# Patient Record
Sex: Male | Born: 1950
Health system: Southern US, Community
[De-identification: ages and names within clinical notes are randomized; demographics above are authoritative.]

## PROBLEM LIST (undated history)

## (undated) DIAGNOSIS — E785 Hyperlipidemia, unspecified: Secondary | ICD-10-CM

## (undated) DIAGNOSIS — D751 Secondary polycythemia: Secondary | ICD-10-CM

## (undated) DIAGNOSIS — I714 Abdominal aortic aneurysm, without rupture, unspecified: Secondary | ICD-10-CM

## (undated) DIAGNOSIS — J449 Chronic obstructive pulmonary disease, unspecified: Secondary | ICD-10-CM

## (undated) DIAGNOSIS — I219 Acute myocardial infarction, unspecified: Secondary | ICD-10-CM

## (undated) DIAGNOSIS — I729 Aneurysm of unspecified site: Secondary | ICD-10-CM

## (undated) DIAGNOSIS — I10 Essential (primary) hypertension: Secondary | ICD-10-CM

## (undated) DIAGNOSIS — I2510 Atherosclerotic heart disease of native coronary artery without angina pectoris: Secondary | ICD-10-CM

## (undated) HISTORY — DX: Acute myocardial infarction, unspecified: I21.9

## (undated) HISTORY — DX: Aneurysm of unspecified site: I72.9

## (undated) HISTORY — PX: TONSILLECTOMY AND ADENOIDECTOMY: SUR1326

## (undated) HISTORY — DX: Essential (primary) hypertension: I10

## (undated) HISTORY — DX: Chronic obstructive pulmonary disease, unspecified: J44.9

## (undated) HISTORY — PX: APPENDECTOMY: SHX54

## (undated) HISTORY — DX: Hyperlipidemia, unspecified: E78.5

## (undated) HISTORY — DX: Abdominal aortic aneurysm, without rupture, unspecified: I71.40

---
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
## 2005-03-06 ENCOUNTER — Ambulatory Visit: Payer: Self-pay | Admitting: Internal Medicine

## 2014-11-05 ENCOUNTER — Encounter: Payer: Self-pay | Admitting: General Surgery

## 2014-11-13 ENCOUNTER — Encounter: Payer: Self-pay | Admitting: General Surgery

## 2014-11-13 ENCOUNTER — Ambulatory Visit (INDEPENDENT_AMBULATORY_CARE_PROVIDER_SITE_OTHER): Payer: PPO | Admitting: General Surgery

## 2014-11-13 VITALS — BP 124/78 | HR 80 | Resp 12 | Ht 75.0 in | Wt 232.0 lb

## 2014-11-13 DIAGNOSIS — Z1211 Encounter for screening for malignant neoplasm of colon: Secondary | ICD-10-CM

## 2014-11-13 MED ORDER — POLYETHYLENE GLYCOL 3350 17 GM/SCOOP PO POWD: 1.0000 | Freq: Once | ORAL | Status: DC

## 2014-11-13 NOTE — Patient Instructions (Addendum)
Colonoscopy A colonoscopy is an exam to look at the entire large intestine (colon). This exam can help find problems such as tumors, polyps, inflammation, and areas of bleeding. The exam takes about 1 hour.  LET [facility] CARE PROVIDER KNOW ABOUT:   Any allergies you have.  All medicines you are taking, including vitamins, herbs, eye drops, creams, and over-the-counter medicines.  Previous problems you or members of your family have had with the use of anesthetics.  Any blood disorders you have.  Previous surgeries you have had.  Medical conditions you have. RISKS AND COMPLICATIONS  Generally, this is a safe procedure. However, as with any procedure, complications can occur. Possible complications include:  Bleeding.  Tearing or rupture of the colon wall.  Reaction to medicines given during the exam.  Infection (rare). BEFORE THE PROCEDURE   Ask your health care provider about changing or stopping your regular medicines.  You may be prescribed an oral bowel prep. This involves drinking a large amount of medicated liquid, starting the day before your procedure. The liquid will cause you to have multiple loose stools until your stool is almost clear or light green. This cleans out your colon in preparation for the procedure.  Do not eat or drink anything else once you have started the bowel prep, unless your health care provider tells you it is safe to do so.  Arrange for someone to drive you home after the procedure. PROCEDURE   You will be given medicine to help you relax (sedative).  You will lie on your side with your knees bent.  A long, flexible tube with a light and camera on the end (colonoscope) will be inserted through the rectum and into the colon. The camera sends video back to a computer screen as it moves through the colon. The colonoscope also releases carbon dioxide gas to inflate the colon. This helps your health care provider see the area better.  During  the exam, your health care provider may take a small tissue sample (biopsy) to be examined under a microscope if any abnormalities are found.  The exam is finished when the entire colon has been viewed. AFTER THE PROCEDURE   Do not drive for 24 hours after the exam.  You may have a small amount of blood in your stool.  You may pass moderate amounts of gas and have mild abdominal cramping or bloating. This is caused by the gas used to inflate your colon during the exam.  Ask when your test results will be ready and how you will get your results. Make sure you get your test results. Document Released: 03/27/2000 Document Revised: 01/18/2013 Document Reviewed: 12/05/2012 [facility] Patient Information 2015 [place], [place]. This information is not intended to replace advice given to you by your health care provider. Make sure you discuss any questions you have with your health care provider.  Patient is scheduled for a colonoscopy at [facility] on 11/20/14. He is aware to call the day before for his arrival time. Miralax prescription has been sent into his pharmacy. Patient is aware of date and all instructions.

## 2014-11-13 NOTE — Progress Notes (Addendum)
Patient ID: Joshua Schmidt, male   DOB: 07-25-1950, 64 y.o.   MRN: [id number]  Chief Complaint  Patient presents with  . Colonoscopy    HPI Joshua Schmidt is a 64 y.o. male here today for a evaluation of a colonoscopy. Patient states no GI problems. HPI  History reviewed. No pertinent past medical history.  Past Surgical History  Procedure Laterality Date  . Appendectomy    . Tonsillectomy and adenoidectomy      History reviewed. No pertinent family history.  Social History History  Substance Use Topics  . Smoking status: Former Smoker -- 1.00 packs/day for 20 years    Types: Cigarettes  . Smokeless tobacco: Not on file  . Alcohol Use: No    No Known Allergies  Current Outpatient Prescriptions  Medication Sig Dispense Refill  . polyethylene glycol powder (GLYCOLAX/MIRALAX) powder Take 255 g by mouth once. 255 g 0   No current facility-administered medications for this visit.    Review of Systems Review of Systems  Constitutional: Negative.   Respiratory: Negative.   Cardiovascular: Negative.   Gastrointestinal: Negative.     Blood pressure 124/78, pulse 80, resp. rate 12, height 6\' 3"  (1.905 m), weight 232 lb (105.235 kg).  Physical Exam Physical Exam  Constitutional: He is oriented to person, place, and time. He appears well-developed and well-nourished.  Eyes: Conjunctivae are normal. No scleral icterus.  Neck: Neck supple.  Cardiovascular: Normal rate, regular rhythm and normal heart sounds.   Pulmonary/Chest: Effort normal and breath sounds normal.  Abdominal: Soft. Normal appearance and bowel sounds are normal. There is no tenderness. No hernia.    Lymphadenopathy:    He has no cervical adenopathy.  Neurological: He is alert and oriented to person, place, and time.  Skin: Skin is warm and dry.    Data Reviewed Notes reviewed  Assessment    Need for screening colonoscopy    Plan    Colonoscopy with possible biopsy/polypectomy prn:  Information regarding the procedure, including its potential risks and complications (including but not limited to perforation of the bowel, which may require emergency surgery to repair, and bleeding) was verbally given to the patient. Educational information regarding lower instestinal endoscopy was given to the patient. Written instructions for how to complete the bowel prep using Miralax were provided. The importance of drinking ample fluids to avoid dehydration as a result of the prep emphasized.     Patient is scheduled for a colonoscopy at [facility] on 11/20/14. He is aware to call the day before for his arrival time. Miralax prescription has been sent into his pharmacy. Patient is aware of date and all instructions.    Verlean Allport G 11/13/2014, 3:47 PM

## 2014-11-20 ENCOUNTER — Encounter: Payer: Self-pay | Admitting: Anesthesiology

## 2014-11-20 ENCOUNTER — Ambulatory Visit
Admission: RE | Admit: 2014-11-20 | Discharge: 2014-11-20 | Disposition: A | Discharge status: Home / Self Care | Payer: PPO | Source: Ambulatory Visit | Attending: General Surgery | Admitting: General Surgery

## 2014-11-20 ENCOUNTER — Encounter
Admission: RE | Disposition: A | Discharge status: Home / Self Care | Payer: Self-pay | Source: Ambulatory Visit | Attending: General Surgery

## 2014-11-20 ENCOUNTER — Ambulatory Visit: Payer: PPO | Admitting: Anesthesiology

## 2014-11-20 DIAGNOSIS — I10 Essential (primary) hypertension: Secondary | ICD-10-CM | POA: Insufficient documentation

## 2014-11-20 DIAGNOSIS — Z1211 Encounter for screening for malignant neoplasm of colon: Secondary | ICD-10-CM | POA: Diagnosis present

## 2014-11-20 DIAGNOSIS — Z87891 Personal history of nicotine dependence: Secondary | ICD-10-CM | POA: Diagnosis not present

## 2014-11-20 DIAGNOSIS — D122 Benign neoplasm of ascending colon: Secondary | ICD-10-CM | POA: Diagnosis not present

## 2014-11-20 DIAGNOSIS — K5730 Diverticulosis of large intestine without perforation or abscess without bleeding: Secondary | ICD-10-CM | POA: Diagnosis not present

## 2014-11-20 DIAGNOSIS — Z79899 Other long term (current) drug therapy: Secondary | ICD-10-CM | POA: Insufficient documentation

## 2014-11-20 HISTORY — PX: COLONOSCOPY WITH PROPOFOL: SHX5780

## 2014-11-20 SURGERY — COLONOSCOPY WITH PROPOFOL
Anesthesia: General

## 2014-11-20 MED ORDER — PROPOFOL INFUSION 10 MG/ML OPTIME
INTRAVENOUS | Status: DC | PRN
  Administered 2014-11-20 (×2): 120 ug/kg/min via INTRAVENOUS

## 2014-11-20 MED ORDER — SODIUM CHLORIDE 0.9 % IV SOLN
INTRAVENOUS | Status: DC
  Administered 2014-11-20: 1000 mL via INTRAVENOUS

## 2014-11-20 MED ORDER — LACTATED RINGERS IV SOLN
INTRAVENOUS | Status: DC | PRN
  Administered 2014-11-20: 13:00:00 via INTRAVENOUS

## 2014-11-20 MED ORDER — MIDAZOLAM HCL 2 MG/2ML IJ SOLN
INTRAMUSCULAR | Status: DC | PRN
  Administered 2014-11-20: 2 mg via INTRAVENOUS

## 2014-11-20 MED ORDER — FENTANYL CITRATE (PF) 100 MCG/2ML IJ SOLN
INTRAMUSCULAR | Status: DC | PRN
  Administered 2014-11-20: 25 ug via INTRAVENOUS
  Administered 2014-11-20: 50 ug via INTRAVENOUS
  Administered 2014-11-20: 25 ug via INTRAVENOUS

## 2014-11-20 NOTE — Anesthesia Postprocedure Evaluation (Signed)
  Anesthesia Post-op Note  Patient: Joshua Schmidt  Procedure(s) Performed: Procedure(s): COLONOSCOPY WITH PROPOFOL (N/A)  Anesthesia type:General  Patient location: PACU  Post pain: Pain level controlled  Post assessment: Post-op Vital signs reviewed, Patient's Cardiovascular Status Stable, Respiratory Function Stable, Patent Airway and No signs of Nausea or vomiting  Post vital signs: Reviewed and stable  Last Vitals:  Filed Vitals:   11/20/14 1515  BP: 156/103  Pulse: 65  Temp:   Resp: 18    Level of consciousness: awake, alert  and patient cooperative  Complications: No apparent anesthesia complications

## 2014-11-20 NOTE — Anesthesia Procedure Notes (Signed)
Performed by: COOK-MARTIN, Deward Sebek Pre-anesthesia Checklist: Patient identified, Emergency Drugs available, Suction available, Patient being monitored and Timeout performed Patient Re-evaluated:Patient Re-evaluated prior to inductionOxygen Delivery Method: Nasal cannula Preoxygenation: Pre-oxygenation with 100% oxygen Intubation Type: IV induction Placement Confirmation: positive ETCO2 and CO2 detector       

## 2014-11-20 NOTE — Interval H&P Note (Signed)
History and Physical Interval Note:  11/20/2014 1:48 PM  Joshua Schmidt  has presented today for surgery, with the diagnosis of SCREENING  The various methods of treatment have been discussed with the patient and family. After consideration of risks, benefits and other options for treatment, the patient has consented to  Procedure(s): COLONOSCOPY WITH PROPOFOL (N/A) as a surgical intervention .  The patient's history has been reviewed, patient examined, no change in status, stable for surgery.  I have reviewed the patient's chart and labs.  Questions were answered to the patient's satisfaction.     Coty Student G

## 2014-11-20 NOTE — Op Note (Signed)
[facility] Patient Name: Joshua Schmidt Procedure Date: 11/20/2014 1:15 PM MRN: [id number] Account #: [id number] Date of Birth: Sep 01, 1950 Admit Type: Outpatient Age: 64 Room: [facility] ENDO ROOM 1 Gender: Male Note Status: Finalized Procedure:         Colonoscopy Indications:       Screening for colorectal malignant neoplasm Providers:         Orlie Pollen, MD Referring MD:      [name], MD (Referring MD) Medicines:         General Anesthesia Complications:     No immediate complications. Procedure:         Pre-Anesthesia Assessment:                    - General anesthesia under the supervision of an                     anesthesiologist was determined to be medically necessary                     for this procedure based on review of the patient's                     medical history, medications, and prior anesthesia history.                    After obtaining informed consent, the colonoscope was                     passed under direct vision. Throughout the procedure, the                     patient's blood pressure, pulse, and oxygen saturations                     were monitored continuously. The Colonoscope was                     introduced through the anus and advanced to the the cecum,                     identified by the ileocecal valve. The colonoscopy was                     performed without difficulty. The patient tolerated the                     procedure well. The quality of the bowel preparation was                     good. Findings:      The perianal and digital rectal examinations were normal.      A few medium-mouthed diverticula were found in the sigmoid colon.      A 3 mm polyp was found in the proximal ascending colon. The polyp was       sessile. The polyp was removed with a hot biopsy forceps. Resection and       retrieval were complete.      The exam was otherwise without abnormality on direct and  retroflexion       views. Impression:        - Diverticulosis in the sigmoid colon.                    -  One 3 mm polyp in the proximal ascending colon. Resected                     and retrieved.                    - The examination was otherwise normal on direct and                     retroflexion views. Recommendation:    - Use fiber, for example Citrucel, Fibercon, Konsyl or                     Metamucil.                    - Await pathology results.                    - Repeat colonoscopy in 5 years for surveillance. Procedure Code(s): --- Professional ---                    [phone], Colonoscopy, flexible; with removal of tumor(s),                     polyp(s), or other lesion(s) by hot biopsy forceps Diagnosis Code(s): --- Professional ---                    Z12.11, Encounter for screening for malignant neoplasm of                     colon                    D12.2, Benign neoplasm of ascending colon                    K57.30, Diverticulosis of large intestine without                     perforation or abscess without bleeding CPT copyright 2014 American Medical Association. All rights reserved. The codes documented in this report are preliminary and upon coder review may  be revised to meet current compliance requirements. Orlie Pollen, MD 11/20/2014 2:43:29 PM This report has been signed electronically. Number of Addenda: 0 Note Initiated On: 11/20/2014 1:15 PM Scope Withdrawal Time: 0 hours 13 minutes 41 seconds  Total Procedure Duration: 0 hours 22 minutes 44 seconds       [facility]

## 2014-11-20 NOTE — Anesthesia Preprocedure Evaluation (Addendum)
Anesthesia Evaluation  Patient identified by MRN, date of birth, ID band Patient awake    Reviewed: Allergy & Precautions, H&P , NPO status , Patient's Chart, lab work & pertinent test results  Airway Mallampati: III  TM Distance: >3 FB Neck ROM: limited    Dental  (+) Poor Dentition, Chipped   Pulmonary former smoker,  breath sounds clear to auscultation  + decreased breath sounds      Cardiovascular hypertension, Normal cardiovascular examRhythm:regular Rate:Normal     Neuro/Psych negative neurological ROS  negative psych ROS   GI/Hepatic negative GI ROS, Neg liver ROS,   Endo/Other  negative endocrine ROS  Renal/GU negative Renal ROS  negative genitourinary   Musculoskeletal   Abdominal   Peds  Hematology negative hematology ROS (+)   Anesthesia Other Findings   Reproductive/Obstetrics negative OB ROS                           Anesthesia Physical Anesthesia Plan  ASA: II  Anesthesia Plan: General   Post-op Pain Management:    Induction:   Airway Management Planned:   Additional Equipment:   Intra-op Plan:   Post-operative Plan:   Informed Consent: I have reviewed the patients History and Physical, chart, labs and discussed the procedure including the risks, benefits and alternatives for the proposed anesthesia with the patient or authorized representative who has indicated his/her understanding and acceptance.   Dental Advisory Given  Plan Discussed with: Anesthesiologist, CRNA and Surgeon  Anesthesia Plan Comments:        Anesthesia Quick Evaluation

## 2014-11-20 NOTE — H&P (View-Only) (Signed)
Patient ID: Joshua Schmidt, male   DOB: Mar 23, 1951, 64 y.o.   MRN: [id number]  Chief Complaint  Patient presents with  . Colonoscopy    HPI Joshua Schmidt is a 64 y.o. male here today for a evaluation of a colonoscopy. Patient states no GI problems. HPI  History reviewed. No pertinent past medical history.  Past Surgical History  Procedure Laterality Date  . Appendectomy    . Tonsillectomy and adenoidectomy      History reviewed. No pertinent family history.  Social History History  Substance Use Topics  . Smoking status: Former Smoker -- 1.00 packs/day for 20 years    Types: Cigarettes  . Smokeless tobacco: Not on file  . Alcohol Use: No    No Known Allergies  Current Outpatient Prescriptions  Medication Sig Dispense Refill  . polyethylene glycol powder (GLYCOLAX/MIRALAX) powder Take 255 g by mouth once. 255 g 0   No current facility-administered medications for this visit.    Review of Systems Review of Systems  Constitutional: Negative.   Respiratory: Negative.   Cardiovascular: Negative.   Gastrointestinal: Negative.     Blood pressure 124/78, pulse 80, resp. rate 12, height 6\' 3"  (1.905 m), weight 232 lb (105.235 kg).  Physical Exam Physical Exam  Constitutional: He is oriented to person, place, and time. He appears well-developed and well-nourished.  Eyes: Conjunctivae are normal. No scleral icterus.  Neck: Neck supple.  Cardiovascular: Normal rate, regular rhythm and normal heart sounds.   Pulmonary/Chest: Effort normal and breath sounds normal.  Abdominal: Soft. Normal appearance and bowel sounds are normal. There is no tenderness. No hernia.    Lymphadenopathy:    He has no cervical adenopathy.  Neurological: He is alert and oriented to person, place, and time.  Skin: Skin is warm and dry.    Data Reviewed Notes reviewed  Assessment    Need for screening colonoscopy    Plan    Colonoscopy with possible biopsy/polypectomy prn:  Information regarding the procedure, including its potential risks and complications (including but not limited to perforation of the bowel, which may require emergency surgery to repair, and bleeding) was verbally given to the patient. Educational information regarding lower instestinal endoscopy was given to the patient. Written instructions for how to complete the bowel prep using Miralax were provided. The importance of drinking ample fluids to avoid dehydration as a result of the prep emphasized.     Patient is scheduled for a colonoscopy at [facility] on 11/20/14. He is aware to call the day before for his arrival time. Miralax prescription has been sent into his pharmacy. Patient is aware of date and all instructions.    SANKAR,SEEPLAPUTHUR G 11/13/2014, 3:47 PM

## 2014-11-20 NOTE — Transfer of Care (Signed)
Immediate Anesthesia Transfer of Care Note  Patient: Joshua Schmidt  Procedure(s) Performed: Procedure(s): COLONOSCOPY WITH PROPOFOL (N/A)  Patient Location: PACU  Anesthesia Type:General  Level of Consciousness: awake, alert  and sedated  Airway & Oxygen Therapy: Patient Spontanous Breathing and Patient connected to nasal cannula oxygen  Post-op Assessment: Report given to RN and Post -op Vital signs reviewed and stable  Post vital signs: Reviewed and stable  Last Vitals:  Filed Vitals:   11/20/14 1302  BP: 175/113  Pulse: 81  Temp: 37.6 C  Resp: 17    Complications: No apparent anesthesia complications

## 2014-11-22 LAB — SURGICAL PATHOLOGY

## 2014-11-23 ENCOUNTER — Encounter: Payer: Self-pay | Admitting: General Surgery

## 2014-11-27 ENCOUNTER — Telehealth: Payer: Self-pay | Admitting: *Deleted

## 2014-11-27 NOTE — Telephone Encounter (Signed)
-----   Message from Christene Lye, MD sent at 11/23/2014  7:11 AM EDT ----- Please let pt pt know the pathology was normal.He will need repeat colonoscopy in 5 yrs

## 2014-11-27 NOTE — Telephone Encounter (Signed)
Notified patient as instructed, patient pleased. Discussed follow-up appointments, patient agrees. Placed in recalls.  

## 2015-04-29 DIAGNOSIS — R1030 Lower abdominal pain, unspecified: Secondary | ICD-10-CM | POA: Diagnosis not present

## 2015-04-29 DIAGNOSIS — E7800 Pure hypercholesterolemia, unspecified: Secondary | ICD-10-CM | POA: Diagnosis not present

## 2015-04-29 DIAGNOSIS — E041 Nontoxic single thyroid nodule: Secondary | ICD-10-CM | POA: Diagnosis not present

## 2015-04-29 DIAGNOSIS — E559 Vitamin D deficiency, unspecified: Secondary | ICD-10-CM | POA: Diagnosis not present

## 2015-04-29 DIAGNOSIS — K5791 Diverticulosis of intestine, part unspecified, without perforation or abscess with bleeding: Secondary | ICD-10-CM | POA: Diagnosis not present

## 2015-04-29 DIAGNOSIS — R5383 Other fatigue: Secondary | ICD-10-CM | POA: Diagnosis not present

## 2015-04-29 DIAGNOSIS — J449 Chronic obstructive pulmonary disease, unspecified: Secondary | ICD-10-CM | POA: Diagnosis not present

## 2015-04-29 DIAGNOSIS — F4310 Post-traumatic stress disorder, unspecified: Secondary | ICD-10-CM | POA: Diagnosis not present

## 2015-10-31 DIAGNOSIS — R5383 Other fatigue: Secondary | ICD-10-CM | POA: Diagnosis not present

## 2015-10-31 DIAGNOSIS — Z716 Tobacco abuse counseling: Secondary | ICD-10-CM | POA: Diagnosis not present

## 2015-10-31 DIAGNOSIS — I10 Essential (primary) hypertension: Secondary | ICD-10-CM | POA: Diagnosis not present

## 2015-10-31 DIAGNOSIS — E7800 Pure hypercholesterolemia, unspecified: Secondary | ICD-10-CM | POA: Diagnosis not present

## 2015-10-31 DIAGNOSIS — J449 Chronic obstructive pulmonary disease, unspecified: Secondary | ICD-10-CM | POA: Diagnosis not present

## 2015-10-31 DIAGNOSIS — R7989 Other specified abnormal findings of blood chemistry: Secondary | ICD-10-CM | POA: Diagnosis not present

## 2015-10-31 DIAGNOSIS — E559 Vitamin D deficiency, unspecified: Secondary | ICD-10-CM | POA: Diagnosis not present

## 2015-10-31 DIAGNOSIS — E784 Other hyperlipidemia: Secondary | ICD-10-CM | POA: Diagnosis not present

## 2015-10-31 DIAGNOSIS — R609 Edema, unspecified: Secondary | ICD-10-CM | POA: Diagnosis not present

## 2015-11-04 DIAGNOSIS — Z87891 Personal history of nicotine dependence: Secondary | ICD-10-CM | POA: Diagnosis not present

## 2015-11-04 DIAGNOSIS — R0989 Other specified symptoms and signs involving the circulatory and respiratory systems: Secondary | ICD-10-CM | POA: Diagnosis not present

## 2015-11-04 DIAGNOSIS — M545 Low back pain: Secondary | ICD-10-CM | POA: Diagnosis not present

## 2015-11-05 ENCOUNTER — Encounter: Payer: Self-pay | Admitting: *Deleted

## 2015-11-06 ENCOUNTER — Ambulatory Visit (INDEPENDENT_AMBULATORY_CARE_PROVIDER_SITE_OTHER): Payer: PPO | Admitting: General Surgery

## 2015-11-06 ENCOUNTER — Encounter: Payer: Self-pay | Admitting: General Surgery

## 2015-11-06 VITALS — BP 130/74 | HR 76 | Resp 12 | Ht 75.0 in | Wt 209.0 lb

## 2015-11-06 DIAGNOSIS — L723 Sebaceous cyst: Secondary | ICD-10-CM | POA: Diagnosis not present

## 2015-11-06 NOTE — Patient Instructions (Signed)
The patient is aware to call back for any questions or concerns.  

## 2015-11-06 NOTE — Progress Notes (Signed)
Patient ID: Joshua Schmidt, male   DOB: 10/31/1950, 65 y.o.   MRN: [id number]  Chief Complaint  Patient presents with  . Other    cyst on back     HPI Joshua Schmidt is a 65 y.o. male here today for a evaluation of cysts on his back and forehead. Patient states he noticed the one on his back about 30 years and has gotten larger over time and is tender when pressure is placed on the area. The one on his forehead has been there for about 1 year and is not at all painful. He would like the forehead cyst evaluated for cosmetic purposes. He also states he has a small cyst on his abdomen. He states he had a recent cardiogram that showed a "silent" heart attack. I have reviewed the history of present illness with the patient.  HPI  Past Medical History:  Diagnosis Date  . Myocardial infarction [facility])     Past Surgical History:  Procedure Laterality Date  . APPENDECTOMY    . COLONOSCOPY WITH PROPOFOL N/A 11/20/2014   Procedure: COLONOSCOPY WITH PROPOFOL;  Surgeon: Christene Lye, MD;  Location: [facility] ENDOSCOPY;  Service: Endoscopy;  Laterality: N/A;  . TONSILLECTOMY AND ADENOIDECTOMY      No family history on file.  Social History Social History  Substance Use Topics  . Smoking status: Former Smoker    Packs/day: 1.00    Years: 20.00    Types: Cigarettes  . Smokeless tobacco: Never Used  . Alcohol use No    No Known Allergies  Current Outpatient Prescriptions  Medication Sig Dispense Refill  . hydrochlorothiazide (HYDRODIURIL) 25 MG tablet Take 25 mg by mouth daily.     No current facility-administered medications for this visit.     Review of Systems Review of Systems  Constitutional: Negative.   Respiratory: Negative.   Cardiovascular: Negative.     Blood pressure 130/74, pulse 76, resp. rate 12, height 6\' 3"  (1.905 m), weight 209 lb (94.8 kg).  Physical Exam Physical Exam  Constitutional: He is oriented to person, place, and time. He appears well-developed  and well-nourished.  HENT:  Head: Normocephalic and atraumatic.  Eyes: Conjunctivae are normal. No scleral icterus.  Neck: Neck supple.  Cardiovascular: Normal rate, regular rhythm and normal heart sounds.   Abdominal: Soft. Normal appearance. There is no tenderness.  Lymphadenopathy:    He has no cervical adenopathy.  Neurological: He is alert and oriented to person, place, and time.  Skin: Skin is warm and dry.     Psychiatric: His behavior is normal.    Data Reviewed None  Assessment    Sebaceous cysts, back and forehead    Plan    Removal of sebaceous cysts. The risks and benefits of in-office cyst removal were discussed with patient. Based on previous MI history, the risks associated with the procedure were discussed with his PCP. Patient was cleared for the procedure.     PCP Lorelee Market MD Ref Hershal Coria   This information has been scribed by Karie Fetch RN, BSN,BC.   Kialee Kham G 11/08/2015, 11:25 AM

## 2015-11-08 ENCOUNTER — Encounter: Payer: Self-pay | Admitting: General Surgery

## 2015-11-08 DIAGNOSIS — L723 Sebaceous cyst: Secondary | ICD-10-CM | POA: Insufficient documentation

## 2015-11-25 ENCOUNTER — Ambulatory Visit (INDEPENDENT_AMBULATORY_CARE_PROVIDER_SITE_OTHER): Payer: PPO | Admitting: General Surgery

## 2015-11-25 ENCOUNTER — Encounter: Payer: Self-pay | Admitting: General Surgery

## 2015-11-25 VITALS — BP 152/82 | HR 60 | Ht 75.0 in | Wt 211.0 lb

## 2015-11-25 DIAGNOSIS — L723 Sebaceous cyst: Secondary | ICD-10-CM

## 2015-11-25 DIAGNOSIS — L720 Epidermal cyst: Secondary | ICD-10-CM | POA: Diagnosis not present

## 2015-11-25 NOTE — Patient Instructions (Signed)
Sebaceous Cyst Removal, Care After Refer to this sheet in the next few weeks. These instructions provide you with information about caring for yourself after your procedure. Your health care provider may also give you more specific instructions. Your treatment has been planned according to current medical practices, but problems sometimes occur. Call your health care provider if you have any problems or questions after your procedure. WHAT TO EXPECT AFTER THE PROCEDURE After your procedure, it is common to have:  Soreness in the area where your cyst was removed.  Tightness or itching from your skin sutures. HOME CARE INSTRUCTIONS  Take medicines only as directed by your health care provider.  If you were prescribed an antibiotic medicine, finish all of it even if you start to feel better.  Use antibiotic ointment as directed by your health care provider. Follow the instructions carefully.  There are many different ways to close and cover an incision, including stitches (sutures), skin glue, and adhesive strips. Follow your health care provider's instructions about:  Incision care.  Bandage (dressing) changes and removal.  Incision closure removal.  Keep the bandage (dressing) dry until your health care provider says that it can be removed. Take sponge baths only. Ask your health care provider when you can start showering or taking a bath.  After your dressing is off, check your incision every day for signs of infection. Watch for:  Redness, swelling, or pain.  Fluid, blood, or pus.  You can return to your normal activities. Do not do anything that stretches or puts pressure on your incision.  You can return to your normal diet.  Keep all follow-up visits as directed by your health care provider. This is important. SEEK MEDICAL CARE IF:  You have a fever.  Your incision bleeds.  You have redness, swelling, or pain in the incision area.  You have fluid, blood, or pus coming  from your incision.  Your cyst comes back after surgery.   This information is not intended to replace advice given to you by your health care provider. Make sure you discuss any questions you have with your health care provider.   Document Released: 04/20/2014 Document Reviewed: 04/20/2014 Elsevier Interactive Patient Education [employer].

## 2015-11-25 NOTE — Progress Notes (Signed)
Patient ID: Joshua Schmidt, male   DOB: July 15, 1950, 65 y.o.   MRN: [id number]  Chief Complaint  Patient presents with  . Other    cyst excision    HPI Joshua Schmidt is a 65 y.o. male here today for planned cyst excision from back and forehead forehead. HPI  Past Medical History:  Diagnosis Date  . Myocardial infarction [facility])     Past Surgical History:  Procedure Laterality Date  . APPENDECTOMY    . COLONOSCOPY WITH PROPOFOL N/A 11/20/2014   Procedure: COLONOSCOPY WITH PROPOFOL;  Surgeon: Christene Lye, MD;  Location: [facility] ENDOSCOPY;  Service: Endoscopy;  Laterality: N/A;  . TONSILLECTOMY AND ADENOIDECTOMY      No family history on file.  Social History Social History  Substance Use Topics  . Smoking status: Former Smoker    Packs/day: 1.00    Years: 20.00    Types: Cigarettes  . Smokeless tobacco: Never Used  . Alcohol use No    No Known Allergies  Current Outpatient Prescriptions  Medication Sig Dispense Refill  . hydrochlorothiazide (HYDRODIURIL) 25 MG tablet Take 25 mg by mouth daily.     No current facility-administered medications for this visit.     Review of Systems Review of Systems  Constitutional: Negative.   Respiratory: Negative.   Cardiovascular: Negative.     Blood pressure (!) 152/82, pulse 60, height 6\' 3"  (1.905 m), weight 211 lb (95.7 kg).  Physical Exam Physical Exam  Constitutional: He is oriented to person, place, and time. He appears well-developed and well-nourished.  Neurological: He is alert and oriented to person, place, and time.  Skin: Skin is warm and dry.  No changes in cysts from initial visit. Next to the cyst on forehead 3 78mm warty lesions noted and pt wished to have them removed or burnt Data Reviewed Prior notes  Assessment    Large cyst mid back, cyst  on forehwad    Plan Excision performed as planned  Procedure: 1.  Excision, 2.5cm cyst from midback with intermediate closure                      2.   Excision, 1.5cm cyst from forehead and burning of warty leasions  Anesthetic: 1.  Back: 15mL 0.5% marcaine mixed with 1% xylocaine                      2.  Forehead: 39mL 0.5% marcaine with epinephrine mixed with 1% xylocaine  Prep:  Chloroprep  Description: Following consent, patient positioned himself prone on the table and 85mL 0.5% marcaine mixed with 1% xylocaine was instilled into the area surrounding the midback cyst. The area was then lightly dressed with sterile sponges and the patient repositioned himself to be supine on the table. 42mL 0.5% marcaine with epinephrine mixed with 1% xylocaine was instilled in the area surrounding the forehead cyst and warty lesions. This area was lightly dressed and the patient then repositioned himself prone.   The area surrounding the midback cyst was undressed, prepped and draped with sterile towels. A 4cm vertical elliptical  incision was made around the cyst and extended down through the subcutaneous tissue. The cyst was freed from the surrounding tissue and excised completely.  Point bleeding was controlled with cautery. Four sutures of 3-0 vicryl was used to close the subcutaneous tissue. Interrupted vertical mattress and simple sutures with 3-0 Nylon was used to close the skin. The area was dressed with  neosporin ointment, telfa, gauze and tegaderm.  The patient repositioned himself to be supine on the table and the forehead dressing was removed. The warty lesions were burned with cautery. The area surrounding the cysts were prepped and draped with sterile towels. A 2.5cm horizontal elliptical incision was made around the cyst and extended down through the subcutaneous tissue. The cyst was freed from the surrounding tissue and excised completely. Point bleeding was controlled with cautery. Interrupted vertical mattress sutures with 5-0 Nylon were used to control bleeding and close the skin. The area was dressed with neosporin ointment, telfa and secured to  the forehead with paper tape.  Patient was provided an icepack to control swelling on his forehead.  Both specimens were sent to pathology for analysis.    Patient will return in one week for removal of the forehead sutures and in 2 weeks for sutures on his back   This information has been scribed by Verlene Mayer, [place] 11/25/2015, 3:20 PM

## 2015-11-28 ENCOUNTER — Telehealth: Payer: Self-pay | Admitting: *Deleted

## 2015-11-28 NOTE — Telephone Encounter (Signed)
-----   Message from Christene Lye, MD sent at 11/28/2015  7:01 AM EDT ----- Please let pt pt know the pathology was normal.

## 2015-12-02 ENCOUNTER — Encounter: Payer: Self-pay | Admitting: *Deleted

## 2015-12-02 ENCOUNTER — Ambulatory Visit (INDEPENDENT_AMBULATORY_CARE_PROVIDER_SITE_OTHER): Payer: PPO | Admitting: *Deleted

## 2015-12-02 DIAGNOSIS — L723 Sebaceous cyst: Secondary | ICD-10-CM

## 2015-12-02 NOTE — Progress Notes (Signed)
The sutures were removed. The wound is clean and healing well.

## 2015-12-09 ENCOUNTER — Ambulatory Visit: Payer: PPO | Admitting: *Deleted

## 2015-12-09 DIAGNOSIS — L723 Sebaceous cyst: Secondary | ICD-10-CM

## 2015-12-09 NOTE — Patient Instructions (Signed)
Return as needed

## 2015-12-09 NOTE — Progress Notes (Signed)
The sutures were removed and steri strips applied.Patient came in today for a wound check.  The wound is clean, with no signs of infection noted.

## 2016-06-02 ENCOUNTER — Ambulatory Visit
Admission: RE | Admit: 2016-06-02 | Discharge: 2016-06-02 | Disposition: A | Discharge status: Home / Self Care | Payer: PPO | Source: Ambulatory Visit | Attending: Internal Medicine | Admitting: Internal Medicine

## 2016-06-02 ENCOUNTER — Other Ambulatory Visit: Payer: Self-pay | Admitting: Internal Medicine

## 2016-06-02 DIAGNOSIS — M25561 Pain in right knee: Secondary | ICD-10-CM

## 2016-06-02 DIAGNOSIS — M25461 Effusion, right knee: Secondary | ICD-10-CM

## 2016-06-02 DIAGNOSIS — M7989 Other specified soft tissue disorders: Secondary | ICD-10-CM | POA: Diagnosis not present

## 2017-01-04 DIAGNOSIS — J301 Allergic rhinitis due to pollen: Secondary | ICD-10-CM | POA: Diagnosis not present

## 2017-01-04 DIAGNOSIS — J3489 Other specified disorders of nose and nasal sinuses: Secondary | ICD-10-CM | POA: Diagnosis not present

## 2017-05-25 DIAGNOSIS — L821 Other seborrheic keratosis: Secondary | ICD-10-CM | POA: Diagnosis not present

## 2017-05-25 DIAGNOSIS — D485 Neoplasm of uncertain behavior of skin: Secondary | ICD-10-CM | POA: Diagnosis not present

## 2017-05-25 DIAGNOSIS — L989 Disorder of the skin and subcutaneous tissue, unspecified: Secondary | ICD-10-CM | POA: Diagnosis not present

## 2017-05-25 DIAGNOSIS — D692 Other nonthrombocytopenic purpura: Secondary | ICD-10-CM | POA: Diagnosis not present

## 2017-05-25 DIAGNOSIS — L578 Other skin changes due to chronic exposure to nonionizing radiation: Secondary | ICD-10-CM | POA: Diagnosis not present

## 2017-06-01 DIAGNOSIS — D692 Other nonthrombocytopenic purpura: Secondary | ICD-10-CM | POA: Diagnosis not present

## 2017-06-01 DIAGNOSIS — L578 Other skin changes due to chronic exposure to nonionizing radiation: Secondary | ICD-10-CM | POA: Diagnosis not present

## 2017-10-07 DIAGNOSIS — I252 Old myocardial infarction: Secondary | ICD-10-CM | POA: Diagnosis not present

## 2017-10-07 DIAGNOSIS — Z720 Tobacco use: Secondary | ICD-10-CM | POA: Diagnosis not present

## 2017-10-07 DIAGNOSIS — Z7982 Long term (current) use of aspirin: Secondary | ICD-10-CM | POA: Diagnosis not present

## 2017-10-07 DIAGNOSIS — R69 Illness, unspecified: Secondary | ICD-10-CM | POA: Diagnosis not present

## 2017-10-07 DIAGNOSIS — I10 Essential (primary) hypertension: Secondary | ICD-10-CM | POA: Diagnosis not present

## 2017-10-07 DIAGNOSIS — E785 Hyperlipidemia, unspecified: Secondary | ICD-10-CM | POA: Diagnosis not present

## 2017-10-07 DIAGNOSIS — R609 Edema, unspecified: Secondary | ICD-10-CM | POA: Diagnosis not present

## 2018-01-12 DIAGNOSIS — R69 Illness, unspecified: Secondary | ICD-10-CM | POA: Diagnosis not present

## 2018-07-12 DIAGNOSIS — I10 Essential (primary) hypertension: Secondary | ICD-10-CM | POA: Diagnosis not present

## 2018-07-12 DIAGNOSIS — M76891 Other specified enthesopathies of right lower limb, excluding foot: Secondary | ICD-10-CM | POA: Diagnosis not present

## 2018-07-12 DIAGNOSIS — J449 Chronic obstructive pulmonary disease, unspecified: Secondary | ICD-10-CM | POA: Diagnosis not present

## 2018-07-12 DIAGNOSIS — R9431 Abnormal electrocardiogram [ECG] [EKG]: Secondary | ICD-10-CM | POA: Diagnosis not present

## 2018-10-17 DIAGNOSIS — J449 Chronic obstructive pulmonary disease, unspecified: Secondary | ICD-10-CM | POA: Diagnosis not present

## 2018-10-17 DIAGNOSIS — M76891 Other specified enthesopathies of right lower limb, excluding foot: Secondary | ICD-10-CM | POA: Diagnosis not present

## 2018-10-17 DIAGNOSIS — I10 Essential (primary) hypertension: Secondary | ICD-10-CM | POA: Diagnosis not present

## 2018-10-17 DIAGNOSIS — R9431 Abnormal electrocardiogram [ECG] [EKG]: Secondary | ICD-10-CM | POA: Diagnosis not present

## 2018-10-18 ENCOUNTER — Telehealth: Payer: Self-pay | Admitting: *Deleted

## 2018-10-18 ENCOUNTER — Other Ambulatory Visit: Payer: Self-pay | Admitting: Internal Medicine

## 2018-10-18 ENCOUNTER — Ambulatory Visit
Admission: RE | Admit: 2018-10-18 | Discharge: 2018-10-18 | Disposition: A | Discharge status: Home / Self Care | Payer: Medicare Other | Attending: Internal Medicine | Admitting: Internal Medicine

## 2018-10-18 ENCOUNTER — Ambulatory Visit
Admission: RE | Admit: 2018-10-18 | Discharge: 2018-10-18 | Disposition: A | Discharge status: Home / Self Care | Payer: Medicare Other | Source: Ambulatory Visit | Attending: Internal Medicine | Admitting: Internal Medicine

## 2018-10-18 ENCOUNTER — Other Ambulatory Visit: Payer: Self-pay

## 2018-10-18 DIAGNOSIS — M25471 Effusion, right ankle: Secondary | ICD-10-CM

## 2018-10-18 DIAGNOSIS — R0989 Other specified symptoms and signs involving the circulatory and respiratory systems: Secondary | ICD-10-CM

## 2018-10-18 DIAGNOSIS — Z87891 Personal history of nicotine dependence: Secondary | ICD-10-CM

## 2018-10-18 DIAGNOSIS — Z122 Encounter for screening for malignant neoplasm of respiratory organs: Secondary | ICD-10-CM

## 2018-10-18 NOTE — Telephone Encounter (Signed)
Received referral for initial lung cancer screening scan. Contacted patient and obtained smoking history,(current, 76.5 pack year) as well as answering questions related to screening process. Patient denies signs of lung cancer such as weight loss or hemoptysis. Patient denies comorbidity that would prevent curative treatment if lung cancer were found. Patient is scheduled for shared decision making visit and CT scan on 10/27/18 at 115pm.

## 2018-10-26 ENCOUNTER — Encounter: Payer: Self-pay | Admitting: Oncology

## 2018-10-27 ENCOUNTER — Inpatient Hospital Stay: Payer: Medicare Other | Attending: Oncology | Admitting: Nurse Practitioner

## 2018-10-27 ENCOUNTER — Ambulatory Visit: Payer: PPO | Admitting: Nurse Practitioner

## 2018-10-27 ENCOUNTER — Other Ambulatory Visit: Payer: Self-pay

## 2018-10-27 ENCOUNTER — Ambulatory Visit
Admission: RE | Admit: 2018-10-27 | Discharge: 2018-10-27 | Disposition: A | Discharge status: Home / Self Care | Payer: Medicare Other | Source: Ambulatory Visit | Attending: Nurse Practitioner | Admitting: Nurse Practitioner

## 2018-10-27 DIAGNOSIS — F17210 Nicotine dependence, cigarettes, uncomplicated: Secondary | ICD-10-CM | POA: Diagnosis not present

## 2018-10-27 DIAGNOSIS — Z87891 Personal history of nicotine dependence: Secondary | ICD-10-CM | POA: Diagnosis present

## 2018-10-27 DIAGNOSIS — Z122 Encounter for screening for malignant neoplasm of respiratory organs: Secondary | ICD-10-CM

## 2018-10-27 DIAGNOSIS — Z720 Tobacco use: Secondary | ICD-10-CM | POA: Insufficient documentation

## 2018-10-27 NOTE — Progress Notes (Signed)
Virtual Visit via Video Enabled Telemedicine Note   I connected with MACKY GALIK on 10/27/18 at 2:00 PM @ EST by video enabled telemedicine visit and verified that I am speaking with the correct person using two identifiers.   I discussed the limitations, risks, security and privacy concerns of performing an evaluation and management service by telemedicine and the availability of in-person appointments. I also discussed with the patient that there may be a patient responsible charge related to this service. The patient expressed understanding and agreed to proceed.   Other persons participating in the visit and their role in the encounter: Burgess Estelle, RN- checking in patient & navigation  Patient's location: clinic  Provider's location: clinic  Chief Complaint: Low Dose CT Screening  Patient agreed to evaluation by telemedicine to discuss shared decision making for consideration of low dose CT lung cancer screening.    In accordance with CMS guidelines, patient has met eligibility criteria including age, absence of signs or symptoms of lung cancer.  Social History   Tobacco Use  . Smoking status: Current Every Day Smoker    Packs/day: 1.50    Years: 51.00    Pack years: 76.50    Types: Cigarettes  . Smokeless tobacco: Never Used  . Tobacco comment: 1ppd currently  Substance Use Topics  . Alcohol use: No    Alcohol/week: 0.0 standard drinks     A shared decision-making session was conducted prior to the performance of CT scan. This includes one or more decision aids, includes benefits and harms of screening, follow-up diagnostic testing, over-diagnosis, false positive rate, and total radiation exposure.   Counseling on the importance of adherence to annual lung cancer LDCT screening, impact of co-morbidities, and ability or willingness to undergo diagnosis and treatment is imperative for compliance of the program.   Counseling on the importance of continued smoking  cessation for former smokers; the importance of smoking cessation for current smokers, and information about tobacco cessation interventions have been given to patient including [place] and [employer] Quit  programs.   Written order for lung cancer screening with LDCT has been given to the patient and any and all questions have been answered to the best of my abilities.    Yearly follow up will be coordinated by Burgess Estelle, Thoracic Navigator.  I discussed the assessment and treatment plan with the patient. The patient was provided an opportunity to ask questions and all were answered. The patient agreed with the plan and demonstrated an understanding of the instructions.   The patient was advised to call back or seek an in-person evaluation if the symptoms worsen or if the condition fails to improve as anticipated.   I provided 15 minutes of face-to-face video visit time during this encounter, and > 50% was spent counseling as documented under my assessment & plan.   Beckey Rutter, DNP, AGNP-C [place] [facility] [phone] (work cell) [phone] (office)

## 2018-10-28 ENCOUNTER — Telehealth: Payer: Self-pay | Admitting: *Deleted

## 2018-10-28 NOTE — Telephone Encounter (Signed)
Notified patient of LDCT lung cancer screening program results with recommendation for 12 month follow up imaging. Also notified of incidental findings noted below and is encouraged to discuss further with PCP who will receive a copy of this note and/or the CT report. Patient verbalizes understanding.   IMPRESSION: Lung-RADS 2, benign appearance or behavior. Continue annual screening with low-dose chest CT without contrast in 12 months.  Aortic Atherosclerosis (ICD10-I70.0) and Emphysema (ICD10-J43.9).

## 2018-10-31 DIAGNOSIS — I10 Essential (primary) hypertension: Secondary | ICD-10-CM | POA: Diagnosis not present

## 2018-10-31 DIAGNOSIS — I214 Non-ST elevation (NSTEMI) myocardial infarction: Secondary | ICD-10-CM | POA: Diagnosis not present

## 2018-10-31 DIAGNOSIS — D751 Secondary polycythemia: Secondary | ICD-10-CM | POA: Diagnosis not present

## 2018-11-14 ENCOUNTER — Encounter: Payer: Self-pay | Admitting: General Surgery

## 2018-11-30 ENCOUNTER — Encounter: Payer: Self-pay | Admitting: Oncology

## 2018-11-30 ENCOUNTER — Inpatient Hospital Stay: Payer: Medicare Other | Attending: Oncology | Admitting: Oncology

## 2018-11-30 ENCOUNTER — Inpatient Hospital Stay: Payer: Medicare Other

## 2018-11-30 ENCOUNTER — Other Ambulatory Visit: Payer: Self-pay

## 2018-11-30 VITALS — BP 157/95 | HR 70 | Temp 98.0°F | Resp 18 | Wt 210.5 lb

## 2018-11-30 DIAGNOSIS — D751 Secondary polycythemia: Secondary | ICD-10-CM

## 2018-11-30 DIAGNOSIS — I252 Old myocardial infarction: Secondary | ICD-10-CM | POA: Insufficient documentation

## 2018-11-30 DIAGNOSIS — Z79899 Other long term (current) drug therapy: Secondary | ICD-10-CM | POA: Insufficient documentation

## 2018-11-30 DIAGNOSIS — F17210 Nicotine dependence, cigarettes, uncomplicated: Secondary | ICD-10-CM | POA: Diagnosis not present

## 2018-11-30 DIAGNOSIS — I10 Essential (primary) hypertension: Secondary | ICD-10-CM | POA: Diagnosis not present

## 2018-11-30 DIAGNOSIS — Z87891 Personal history of nicotine dependence: Secondary | ICD-10-CM

## 2018-11-30 LAB — CBC WITH DIFFERENTIAL/PLATELET
Abs Immature Granulocytes: 0.03 10*3/uL (ref 0.00–0.07)
Basophils Absolute: 0 10*3/uL (ref 0.0–0.1)
Basophils Relative: 0 %
Eosinophils Absolute: 0.1 10*3/uL (ref 0.0–0.5)
Eosinophils Relative: 2 %
HCT: 57.5 % — ABNORMAL HIGH (ref 39.0–52.0)
Hemoglobin: 19.4 g/dL — ABNORMAL HIGH (ref 13.0–17.0)
Immature Granulocytes: 0 %
Lymphocytes Relative: 9 %
Lymphs Abs: 0.7 10*3/uL (ref 0.7–4.0)
MCH: 33.4 pg (ref 26.0–34.0)
MCHC: 33.7 g/dL (ref 30.0–36.0)
MCV: 99.1 fL (ref 80.0–100.0)
Monocytes Absolute: 0.7 10*3/uL (ref 0.1–1.0)
Monocytes Relative: 9 %
Neutro Abs: 6.5 10*3/uL (ref 1.7–7.7)
Neutrophils Relative %: 80 %
Platelets: 166 10*3/uL (ref 150–400)
RBC: 5.8 MIL/uL (ref 4.22–5.81)
RDW: 13.8 % (ref 11.5–15.5)
WBC: 8 10*3/uL (ref 4.0–10.5)
nRBC: 0 % (ref 0.0–0.2)

## 2018-11-30 LAB — COMPREHENSIVE METABOLIC PANEL
ALT: 22 U/L (ref 0–44)
AST: 21 U/L (ref 15–41)
Albumin: 4 g/dL (ref 3.5–5.0)
Alkaline Phosphatase: 70 U/L (ref 38–126)
Anion gap: 9 (ref 5–15)
BUN: 8 mg/dL (ref 8–23)
CO2: 29 mmol/L (ref 22–32)
Calcium: 9.1 mg/dL (ref 8.9–10.3)
Chloride: 100 mmol/L (ref 98–111)
Creatinine, Ser: 0.71 mg/dL (ref 0.61–1.24)
GFR calc Af Amer: >60 mL/min (ref >60)
GFR calc non Af Amer: >60 mL/min (ref >60)
Glucose, Bld: 102 mg/dL — ABNORMAL HIGH (ref 70–99)
Potassium: 3.7 mmol/L (ref 3.5–5.1)
Sodium: 138 mmol/L (ref 135–145)
Total Bilirubin: 0.7 mg/dL (ref 0.3–1.2)
Total Protein: 8 g/dL (ref 6.5–8.1)

## 2018-11-30 NOTE — Progress Notes (Signed)
Patient here for hematology evaluation.  BP elevated today 157/95.  He says he has been out in the heat today and that sometimes runs his bp up.  Does do home checks with normal readings around 142/78.

## 2018-11-30 NOTE — Progress Notes (Signed)
Hematology/Oncology Consult note [facility] Telephone:([phone] [phone]   Patient Care Team: Cletis Athens, MD as PCP - General (Internal Medicine) Lorelee Market, MD (Family Medicine) Christene Lye, MD (General Surgery)  REFERRING PROVIDER: Cletis Athens, MD  CHIEF COMPLAINTS/REASON FOR VISIT:  Evaluation of polycytosis  HISTORY OF PRESENTING ILLNESS:  Joshua Schmidt is a 68 y.o. male who was seen in consultation at the request of Cletis Athens, MD for evaluation of polycytosis/erythrocytosis Patient had lab work done with primary care provider and was found to have high hemoglobin.  Lab results are not available to me.  Patient was referred to heme-onc for further evaluation and discussion. Associated signs or symptoms: Denies weight loss, fever, chills, fatigue, night sweats.   Context:  Smoking history: 76.5 pack year smoking history, he reports that he quitted in the past and then smoke again Testosterone supplements: Denies History of blood clots: Denies Daytime somnolence: Denies Family history of polycythemia: Denies Denies any personal history of thrombosis, stroke, heart attack.  Denies any skin itchiness.  He is a retired Clinical biochemist, remains active.  Mainly works outdoors.  Review of Systems  Constitutional: Negative for appetite change, chills, fatigue, fever and unexpected weight change.  HENT:   Negative for hearing loss and voice change.   Eyes: Negative for eye problems and icterus.  Respiratory: Negative for chest tightness, cough and shortness of breath.   Cardiovascular: Negative for chest pain and leg swelling.  Gastrointestinal: Negative for abdominal distention and abdominal pain.  Endocrine: Negative for hot flashes.  Genitourinary: Negative for difficulty urinating, dysuria and frequency.   Musculoskeletal: Negative for arthralgias.  Skin: Negative for itching and rash.  Neurological:  Negative for light-headedness and numbness.  Hematological: Negative for adenopathy. Does not bruise/bleed easily.  Psychiatric/Behavioral: Negative for confusion.    MEDICAL HISTORY:  Past Medical History:  Diagnosis Date  . Hypertension   . Myocardial infarction [facility])     SURGICAL HISTORY: Past Surgical History:  Procedure Laterality Date  . APPENDECTOMY    . COLONOSCOPY WITH PROPOFOL N/A 11/20/2014   Procedure: COLONOSCOPY WITH PROPOFOL;  Surgeon: Christene Lye, MD;  Location: [facility] ENDOSCOPY;  Service: Endoscopy;  Laterality: N/A;  . TONSILLECTOMY AND ADENOIDECTOMY      SOCIAL HISTORY: Social History   Socioeconomic History  . Marital status: Married    Spouse name: Not on file  . Number of children: Not on file  . Years of education: Not on file  . Highest education level: Not on file  Occupational History  . Not on file  Social Needs  . Financial resource strain: Not on file  . Food insecurity    Worry: Not on file    Inability: Not on file  . Transportation needs    Medical: Not on file    Non-medical: Not on file  Tobacco Use  . Smoking status: Current Every Day Smoker    Packs/day: 1.50    Years: 51.00    Pack years: 76.50    Types: Cigarettes  . Smokeless tobacco: Never Used  . Tobacco comment: 1ppd currently  Substance and Sexual Activity  . Alcohol use: No    Alcohol/week: 0.0 standard drinks  . Drug use: No  . Sexual activity: Not on file  Lifestyle  . Physical activity    Days per week: Not on file    Minutes per session: Not on file  . Stress: Not on file  Relationships  . Social connections  Talks on phone: Not on file    Gets together: Not on file    Attends religious service: Not on file    Active member of club or organization: Not on file    Attends meetings of clubs or organizations: Not on file    Relationship status: Not on file  . Intimate partner violence    Fear of current or ex partner: Not on file    Emotionally  abused: Not on file    Physically abused: Not on file    Forced sexual activity: Not on file  Other Topics Concern  . Not on file  Social History Narrative  . Not on file    FAMILY HISTORY: History reviewed. No pertinent family history.  ALLERGIES:  has No Known Allergies.  MEDICATIONS:  Current Outpatient Medications  Medication Sig Dispense Refill  . atorvastatin (LIPITOR) 20 MG tablet Take 20 mg by mouth daily.    . hydrochlorothiazide (HYDRODIURIL) 25 MG tablet Take 25 mg by mouth daily.    Marland Kitchen lisinopril (ZESTRIL) 20 MG tablet Take 20 mg by mouth daily.    . metoprolol tartrate (LOPRESSOR) 50 MG tablet Take 50 mg by mouth daily.     No current facility-administered medications for this visit.      PHYSICAL EXAMINATION: ECOG PERFORMANCE STATUS: 0 - Asymptomatic Vitals:   11/30/18 1507  BP: (!) 157/95  Pulse: 70  Resp: 18  Temp: 98 F (36.7 C)   Filed Weights   11/30/18 1507  Weight: 210 lb 8 oz (95.5 kg)    Physical Exam Constitutional:      General: He is not in acute distress. HENT:     Head: Normocephalic and atraumatic.  Eyes:     General: No scleral icterus.    Pupils: Pupils are equal, round, and reactive to light.  Neck:     Musculoskeletal: Normal range of motion and neck supple.  Cardiovascular:     Rate and Rhythm: Normal rate and regular rhythm.     Heart sounds: Normal heart sounds.  Pulmonary:     Effort: Pulmonary effort is normal. No respiratory distress.     Breath sounds: No wheezing.  Abdominal:     General: Bowel sounds are normal. There is no distension.     Palpations: Abdomen is soft. There is no mass.     Tenderness: There is no abdominal tenderness.  Musculoskeletal: Normal range of motion.        General: No deformity.  Skin:    General: Skin is warm and dry.     Findings: No erythema or rash.     Comments: Well-healed vertical scar surgical scar   Neurological:     Mental Status: He is alert and oriented to person, place,  and time.     Cranial Nerves: No cranial nerve deficit.     Coordination: Coordination normal.  Psychiatric:        Behavior: Behavior normal.        Thought Content: Thought content normal.     RADIOGRAPHIC STUDIES: I have personally reviewed the radiological images as listed and agreed with the findings in the report. No results found.   LABORATORY DATA:  I have reviewed the data as listed Lab Results  Component Value Date   WBC 8.0 11/30/2018   HGB 19.4 (H) 11/30/2018   HCT 57.5 (H) 11/30/2018   MCV 99.1 11/30/2018   PLT 166 11/30/2018   Recent Labs    11/30/18 1558  NA 138  K 3.7  CL 100  CO2 29  GLUCOSE 102*  BUN 8  CREATININE 0.71  CALCIUM 9.1  GFRNONAA >60  GFRAA >60  PROT 8.0  ALBUMIN 4.0  AST 21  ALT 22  ALKPHOS 70  BILITOT 0.7   Iron/TIBC/Ferritin/ %Sat No results found for: IRON, TIBC, FERRITIN, IRONPCTSAT      ASSESSMENT & PLAN:  1. Erythrocytosis   2. Personal history of tobacco use, presenting hazards to health    Discussed with patient that polycythemia (erythrocytosis) is an abnormal elevation of hemoglobin (Hgb) and/or hematocrit (Hct) in peripheral blood, and this can be caused by primary etiology, ie bone marrow mutation, or secondary etiology, ie hypoxia, smoking, androgen supplements, etc.  I will obtain erythropoietin, carbo monoxide level, rule out primary etiology, JAK2 with reflex to other mutations,  Clinically, he has risk factors for secondary erythrocytosis. Smoking cessation discussed with patient.  He is not interested at this point.  Labs reviewed.  Hemoglobin.  19.4 with hematocrit 57.8.  High risk of thrombosis.  recommend proceed with phlebotomy 500 cc weekly x2 while waiting for blood work results.  Orders Placed This Encounter  Procedures  . CBC with Differential/Platelet    Standing Status:   Future    Number of Occurrences:   1    Standing Expiration Date:   11/30/2019  . Comprehensive metabolic panel    Standing  Status:   Future    Number of Occurrences:   1    Standing Expiration Date:   11/30/2019  . Erythropoietin    Standing Status:   Future    Number of Occurrences:   1    Standing Expiration Date:   11/30/2019  . Carbon monoxide, blood (performed at ref lab)    Standing Status:   Future    Number of Occurrences:   1    Standing Expiration Date:   11/30/2019  . JAK2 V617F, w Reflex to CALR/E12/MPL    Standing Status:   Future    Number of Occurrences:   1    Standing Expiration Date:   11/30/2019    We spent sufficient time to discuss many aspect of care, questions were answered to patient's satisfaction. The patient knows to call the clinic with any problems questions or concerns.  Cc Cletis Athens, MD  Return of visit: 3 weeks Thank you for this kind referral and the opportunity to participate in the care of this patient. A copy of today's note is routed to referring provider  Total face to face encounter time for this patient visit was 45 min. >50% of the time was  spent in counseling and coordination of care.    Earlie Server, MD, PhD 11/30/2018

## 2018-12-01 ENCOUNTER — Encounter: Payer: Self-pay | Admitting: Oncology

## 2018-12-01 DIAGNOSIS — D751 Secondary polycythemia: Secondary | ICD-10-CM | POA: Insufficient documentation

## 2018-12-01 HISTORY — DX: Secondary polycythemia: D75.1

## 2018-12-01 LAB — ERYTHROPOIETIN: Erythropoietin: 7 m[IU]/mL (ref 2.6–18.5)

## 2018-12-01 LAB — CARBON MONOXIDE, BLOOD (PERFORMED AT REF LAB): Carbon Monoxide, Blood: 13.5 % — ABNORMAL HIGH (ref 0.0–3.6)

## 2018-12-06 ENCOUNTER — Inpatient Hospital Stay: Payer: Medicare Other

## 2018-12-06 ENCOUNTER — Other Ambulatory Visit: Payer: Self-pay

## 2018-12-06 VITALS — BP 126/72 | HR 69 | Temp 98.6°F | Resp 18

## 2018-12-06 DIAGNOSIS — D751 Secondary polycythemia: Secondary | ICD-10-CM

## 2018-12-06 DIAGNOSIS — I252 Old myocardial infarction: Secondary | ICD-10-CM | POA: Diagnosis not present

## 2018-12-06 DIAGNOSIS — I10 Essential (primary) hypertension: Secondary | ICD-10-CM | POA: Diagnosis not present

## 2018-12-06 DIAGNOSIS — Z79899 Other long term (current) drug therapy: Secondary | ICD-10-CM | POA: Diagnosis not present

## 2018-12-09 LAB — CALR + JAK2 E12-15 + MPL (REFLEXED)

## 2018-12-09 LAB — JAK2 V617F, W REFLEX TO CALR/E12/MPL

## 2018-12-13 ENCOUNTER — Other Ambulatory Visit: Payer: Self-pay

## 2018-12-13 ENCOUNTER — Inpatient Hospital Stay: Payer: Medicare Other | Attending: Oncology

## 2018-12-13 VITALS — BP 117/72 | HR 66 | Temp 98.1°F | Resp 20

## 2018-12-13 DIAGNOSIS — F17210 Nicotine dependence, cigarettes, uncomplicated: Secondary | ICD-10-CM | POA: Diagnosis not present

## 2018-12-13 DIAGNOSIS — I252 Old myocardial infarction: Secondary | ICD-10-CM | POA: Insufficient documentation

## 2018-12-13 DIAGNOSIS — D751 Secondary polycythemia: Secondary | ICD-10-CM | POA: Diagnosis present

## 2018-12-13 DIAGNOSIS — Z79899 Other long term (current) drug therapy: Secondary | ICD-10-CM | POA: Insufficient documentation

## 2018-12-13 DIAGNOSIS — I10 Essential (primary) hypertension: Secondary | ICD-10-CM | POA: Diagnosis not present

## 2018-12-22 ENCOUNTER — Inpatient Hospital Stay (HOSPITAL_BASED_OUTPATIENT_CLINIC_OR_DEPARTMENT_OTHER): Payer: Medicare Other | Admitting: Oncology

## 2018-12-22 ENCOUNTER — Other Ambulatory Visit: Payer: Self-pay

## 2018-12-22 ENCOUNTER — Inpatient Hospital Stay: Payer: Medicare Other

## 2018-12-22 ENCOUNTER — Encounter: Payer: Self-pay | Admitting: Oncology

## 2018-12-22 VITALS — BP 150/93 | HR 59 | Temp 98.4°F | Resp 18 | Wt 209.0 lb

## 2018-12-22 DIAGNOSIS — D751 Secondary polycythemia: Secondary | ICD-10-CM

## 2018-12-22 DIAGNOSIS — Z87891 Personal history of nicotine dependence: Secondary | ICD-10-CM

## 2018-12-22 LAB — HEMOGLOBIN AND HEMATOCRIT, BLOOD
HCT: 52.2 % — ABNORMAL HIGH (ref 39.0–52.0)
Hemoglobin: 17.5 g/dL — ABNORMAL HIGH (ref 13.0–17.0)

## 2018-12-22 NOTE — Progress Notes (Signed)
Patient denies any concerns today.  

## 2018-12-22 NOTE — Progress Notes (Signed)
Hematology/Oncology Consult note [facility] [phone] Fax:([phone]   Patient Care Team: Cletis Athens, MD as PCP - General (Internal Medicine) Lorelee Market, MD (Family Medicine) Christene Lye, MD (General Surgery)  REFERRING PROVIDER: Cletis Athens, MD  CHIEF COMPLAINTS/REASON FOR VISIT:  Evaluation of polycytosis  HISTORY OF PRESENTING ILLNESS:  Joshua Schmidt is a 68 y.o. male who was seen in consultation at the request of Cletis Athens, MD for evaluation of polycytosis/erythrocytosis Patient had lab work done with primary care provider and was found to have high hemoglobin.  Lab results are not available to me.  Patient was referred to heme-onc for further evaluation and discussion. Associated signs or symptoms: Denies weight loss, fever, chills, fatigue, night sweats.   Context:  Smoking history: 76.5 pack year smoking history, he reports that he quitted in the past and then smoke again Testosterone supplements: Denies History of blood clots: Denies Daytime somnolence: Denies Family history of polycythemia: Denies Denies any personal history of thrombosis, stroke, heart attack.  Denies any skin itchiness.  He is a retired Clinical biochemist, remains active.  Mainly works outdoors.  INTERVAL HISTORY Joshua Schmidt is a 68 y.o. male who has above history reviewed by me today presents for follow up visit for management of erythrocytosis Problems and complaints are listed below: During the interval, patient underwent therapeutic phlebotomy weekly x2. He reports fatigue is better.  Denies any new complaints.  Review of Systems  Constitutional: Negative for appetite change, chills, diaphoresis, fatigue, fever and unexpected weight change.  HENT:   Negative for hearing loss, lump/mass, nosebleeds, sore throat and voice change.   Eyes: Negative for eye problems and icterus.  Respiratory: Negative for chest tightness,  cough, hemoptysis, shortness of breath and wheezing.   Cardiovascular: Negative for chest pain and leg swelling.  Gastrointestinal: Negative for abdominal distention, abdominal pain, blood in stool, diarrhea, nausea and rectal pain.  Endocrine: Negative for hot flashes.  Genitourinary: Negative for bladder incontinence, difficulty urinating, dysuria, frequency, hematuria and nocturia.   Musculoskeletal: Negative for arthralgias, back pain, flank pain, gait problem and myalgias.  Skin: Negative for itching and rash.  Neurological: Negative for dizziness, gait problem, headaches, light-headedness, numbness and seizures.  Hematological: Negative for adenopathy. Does not bruise/bleed easily.  Psychiatric/Behavioral: Negative for confusion and decreased concentration. The patient is not nervous/anxious.     MEDICAL HISTORY:  Past Medical History:  Diagnosis Date  . Erythrocytosis 12/01/2018  . Hypertension   . Myocardial infarction [facility])     SURGICAL HISTORY: Past Surgical History:  Procedure Laterality Date  . APPENDECTOMY    . COLONOSCOPY WITH PROPOFOL N/A 11/20/2014   Procedure: COLONOSCOPY WITH PROPOFOL;  Surgeon: Christene Lye, MD;  Location: [facility] ENDOSCOPY;  Service: Endoscopy;  Laterality: N/A;  . TONSILLECTOMY AND ADENOIDECTOMY      SOCIAL HISTORY: Social History   Socioeconomic History  . Marital status: Married    Spouse name: Not on file  . Number of children: Not on file  . Years of education: Not on file  . Highest education level: Not on file  Occupational History  . Not on file  Social Needs  . Financial resource strain: Not on file  . Food insecurity    Worry: Not on file    Inability: Not on file  . Transportation needs    Medical: Not on file    Non-medical: Not on file  Tobacco Use  . Smoking status: Current Every Day Smoker  Packs/day: 1.50    Years: 51.00    Pack years: 76.50    Types: Cigarettes  . Smokeless tobacco: Never Used  . Tobacco  comment: 1ppd currently  Substance and Sexual Activity  . Alcohol use: No    Alcohol/week: 0.0 standard drinks  . Drug use: No  . Sexual activity: Not on file  Lifestyle  . Physical activity    Days per week: Not on file    Minutes per session: Not on file  . Stress: Not on file  Relationships  . Social Herbalist on phone: Not on file    Gets together: Not on file    Attends religious service: Not on file    Active member of club or organization: Not on file    Attends meetings of clubs or organizations: Not on file    Relationship status: Not on file  . Intimate partner violence    Fear of current or ex partner: Not on file    Emotionally abused: Not on file    Physically abused: Not on file    Forced sexual activity: Not on file  Other Topics Concern  . Not on file  Social History Narrative  . Not on file    FAMILY HISTORY: History reviewed. No pertinent family history.  ALLERGIES:  has No Known Allergies.  MEDICATIONS:  Current Outpatient Medications  Medication Sig Dispense Refill  . atorvastatin (LIPITOR) 20 MG tablet Take 20 mg by mouth daily.    . hydrochlorothiazide (HYDRODIURIL) 25 MG tablet Take 25 mg by mouth daily.    Marland Kitchen lisinopril (ZESTRIL) 20 MG tablet Take 20 mg by mouth daily.    . metoprolol tartrate (LOPRESSOR) 50 MG tablet Take 50 mg by mouth daily.     No current facility-administered medications for this visit.      PHYSICAL EXAMINATION: ECOG PERFORMANCE STATUS: 0 - Asymptomatic Vitals:   12/22/18 1122  BP: (!) 150/93  Pulse: (!) 59  Resp: 18  Temp: 98.4 F (36.9 C)   Filed Weights   12/22/18 1122  Weight: 209 lb (94.8 kg)    Physical Exam Constitutional:      General: He is not in acute distress. HENT:     Head: Normocephalic and atraumatic.  Eyes:     General: No scleral icterus.    Pupils: Pupils are equal, round, and reactive to light.  Neck:     Musculoskeletal: Normal range of motion and neck supple.   Cardiovascular:     Rate and Rhythm: Normal rate and regular rhythm.     Heart sounds: Normal heart sounds.  Pulmonary:     Effort: Pulmonary effort is normal. No respiratory distress.     Breath sounds: No wheezing.  Abdominal:     General: Bowel sounds are normal. There is no distension.     Palpations: Abdomen is soft. There is no mass.     Tenderness: There is no abdominal tenderness.  Musculoskeletal: Normal range of motion.        General: No deformity.  Skin:    General: Skin is warm and dry.     Findings: No erythema or rash.     Comments: Well-healed vertical scar surgical scar   Neurological:     Mental Status: He is alert and oriented to person, place, and time.     Cranial Nerves: No cranial nerve deficit.     Coordination: Coordination normal.  Psychiatric:        Behavior:  Behavior normal.        Thought Content: Thought content normal.     RADIOGRAPHIC STUDIES: I have personally reviewed the radiological images as listed and agreed with the findings in the report. No results found.   LABORATORY DATA:  I have reviewed the data as listed Lab Results  Component Value Date   WBC 8.0 11/30/2018   HGB 17.5 (H) 12/22/2018   HCT 52.2 (H) 12/22/2018   MCV 99.1 11/30/2018   PLT 166 11/30/2018   Recent Labs    11/30/18 1558  NA 138  K 3.7  CL 100  CO2 29  GLUCOSE 102*  BUN 8  CREATININE 0.71  CALCIUM 9.1  GFRNONAA >60  GFRAA >60  PROT 8.0  ALBUMIN 4.0  AST 21  ALT 22  ALKPHOS 70  BILITOT 0.7   Iron/TIBC/Ferritin/ %Sat No results found for: IRON, TIBC, FERRITIN, IRONPCTSAT      ASSESSMENT & PLAN:  1. Secondary erythrocytosis   2. Personal history of tobacco use, presenting hazards to health    Labs reviewed and discussed with patient. Negative Jak 2 mutations with reflex to CARL and MPL, less likely primary erythrocytosis. Patient is a current daily smoker, elevated of carbon monoxide level. Likely secondary erythrocytosis. Discussed with  patient about phlebotomy to keep HCT less than 50 Smoking cessation was discussed with patient in details. I will repeat H&H today,  Labs are reviewed.  Hemoglobin has improved to 17.5 with hematocrit 52.2. I would recommend patient to proceed with another phlebotomy 500 cc x 1. Follow-up 3 months.  Orders Placed This Encounter  Procedures  . Hemoglobin and Hematocrit, Blood    Standing Status:   Standing    Number of Occurrences:   20    Standing Expiration Date:   12/22/2019    We spent sufficient time to discuss many aspect of care, questions were answered to patient's satisfaction. The patient knows to call the clinic with any problems questions or concerns.  Cc Cletis Athens, MD  Return of visit: 3  Months.  Earlie Server, MD, PhD 12/22/2018

## 2018-12-23 ENCOUNTER — Telehealth: Payer: Self-pay

## 2018-12-23 NOTE — Telephone Encounter (Signed)
-----   Message from Earlie Server, MD sent at 12/22/2018 10:17 PM EDT ----- Hemoglobin improved, HCT still >50,  Please arrange patient to have phlebotomy x 1 in 1 week. Follow up as planned.

## 2018-12-23 NOTE — Telephone Encounter (Signed)
Patient informed of lab results.  Could you please schedule him for phlebotomy next week and keep f/u appts as scheduled.

## 2018-12-23 NOTE — Telephone Encounter (Signed)
Message left to call for lab results.

## 2018-12-28 ENCOUNTER — Other Ambulatory Visit: Payer: Self-pay

## 2018-12-29 ENCOUNTER — Inpatient Hospital Stay: Payer: Medicare Other

## 2018-12-29 ENCOUNTER — Other Ambulatory Visit: Payer: Self-pay

## 2018-12-29 VITALS — BP 131/90 | HR 72 | Temp 99.1°F | Resp 18

## 2018-12-29 DIAGNOSIS — D751 Secondary polycythemia: Secondary | ICD-10-CM | POA: Diagnosis not present

## 2018-12-29 NOTE — Progress Notes (Signed)
Therapeutic phlebotomy performed per MD order. 500 cc removed via 20 gauge PIV in left AC. Pt tolerated procedure well. Pt declines snack or drink.  Pt and VS stable at discharge.

## 2019-03-24 ENCOUNTER — Encounter: Payer: Self-pay | Admitting: Oncology

## 2019-03-24 ENCOUNTER — Inpatient Hospital Stay: Payer: Medicare Other

## 2019-03-24 ENCOUNTER — Other Ambulatory Visit: Payer: Self-pay

## 2019-03-24 ENCOUNTER — Inpatient Hospital Stay: Payer: Medicare Other | Attending: Oncology | Admitting: Oncology

## 2019-03-24 VITALS — BP 150/78 | HR 71 | Resp 18

## 2019-03-24 VITALS — BP 165/93 | HR 69 | Temp 98.1°F | Resp 18 | Wt 207.1 lb

## 2019-03-24 DIAGNOSIS — D751 Secondary polycythemia: Secondary | ICD-10-CM | POA: Insufficient documentation

## 2019-03-24 DIAGNOSIS — I252 Old myocardial infarction: Secondary | ICD-10-CM | POA: Insufficient documentation

## 2019-03-24 DIAGNOSIS — Z79899 Other long term (current) drug therapy: Secondary | ICD-10-CM | POA: Insufficient documentation

## 2019-03-24 DIAGNOSIS — I10 Essential (primary) hypertension: Secondary | ICD-10-CM | POA: Insufficient documentation

## 2019-03-24 DIAGNOSIS — F17210 Nicotine dependence, cigarettes, uncomplicated: Secondary | ICD-10-CM | POA: Diagnosis not present

## 2019-03-24 DIAGNOSIS — Z87891 Personal history of nicotine dependence: Secondary | ICD-10-CM

## 2019-03-24 LAB — HEMOGLOBIN AND HEMATOCRIT, BLOOD
HCT: 55.7 % — ABNORMAL HIGH (ref 39.0–52.0)
Hemoglobin: 18.2 g/dL — ABNORMAL HIGH (ref 13.0–17.0)

## 2019-03-24 NOTE — Progress Notes (Signed)
Patient does not offer any problems today.  

## 2019-03-26 NOTE — Progress Notes (Signed)
Hematology/Oncology  Follow up note [facility] Telephone:(336) [phone] Fax:(336) [phone]   Patient Care Team: Cletis Athens, MD as PCP - General (Internal Medicine) Lorelee Market, MD (Family Medicine) Christene Lye, MD (General Surgery) Earlie Server, MD as Consulting Physician (Oncology)  REFERRING PROVIDER: Cletis Athens, MD  CHIEF COMPLAINTS/REASON FOR VISIT:  Follow up for erythrocytosis.  HISTORY OF PRESENTING ILLNESS:  Joshua Schmidt is a 68 y.o. male who was seen in consultation at the request of Cletis Athens, MD for evaluation of polycytosis/erythrocytosis Patient had lab work done with primary care provider and was found to have high hemoglobin.  Lab results are not available to me.  Patient was referred to heme-onc for further evaluation and discussion. Associated signs or symptoms: Denies weight loss, fever, chills, fatigue, night sweats.   Context:  Smoking history: 76.5 pack year smoking history, he reports that he quitted in the past and then smoke again Testosterone supplements: Denies History of blood clots: Denies Daytime somnolence: Denies Family history of polycythemia: Denies Denies any personal history of thrombosis, stroke, heart attack.  Denies any skin itchiness.  He is a retired Clinical biochemist, remains active.  Mainly works outdoors.  INTERVAL HISTORY Joshua Schmidt is a 68 y.o. male who has above history reviewed by me today presents for follow up visit for management of erythrocytosis Problems and complaints are listed below: Patient has been on therapeutic phlebotomy intermittently for secondary erythrocytosis. Patient continues to smoke more than one pack of cigarettes daily.   Today he reports feeling well.  Denies any new complaints.  Review of Systems  Constitutional: Negative for appetite change, chills, diaphoresis, fatigue, fever and unexpected weight change.  HENT:   Negative for hearing loss,  lump/mass, nosebleeds, sore throat and voice change.   Eyes: Negative for eye problems and icterus.  Respiratory: Negative for chest tightness, cough, hemoptysis, shortness of breath and wheezing.   Cardiovascular: Negative for chest pain and leg swelling.  Gastrointestinal: Negative for abdominal distention, abdominal pain, blood in stool, diarrhea, nausea and rectal pain.  Endocrine: Negative for hot flashes.  Genitourinary: Negative for bladder incontinence, difficulty urinating, dysuria, frequency, hematuria and nocturia.   Musculoskeletal: Negative for arthralgias, back pain, flank pain, gait problem and myalgias.  Skin: Negative for itching and rash.  Neurological: Negative for dizziness, gait problem, headaches, light-headedness, numbness and seizures.  Hematological: Negative for adenopathy. Does not bruise/bleed easily.  Psychiatric/Behavioral: Negative for confusion and decreased concentration. The patient is not nervous/anxious.     MEDICAL HISTORY:  Past Medical History:  Diagnosis Date  . Erythrocytosis 12/01/2018  . Hypertension   . Myocardial infarction [facility])     SURGICAL HISTORY: Past Surgical History:  Procedure Laterality Date  . APPENDECTOMY    . COLONOSCOPY WITH PROPOFOL N/A 11/20/2014   Procedure: COLONOSCOPY WITH PROPOFOL;  Surgeon: Christene Lye, MD;  Location: [facility] ENDOSCOPY;  Service: Endoscopy;  Laterality: N/A;  . TONSILLECTOMY AND ADENOIDECTOMY      SOCIAL HISTORY: Social History   Socioeconomic History  . Marital status: Married    Spouse name: Not on file  . Number of children: Not on file  . Years of education: Not on file  . Highest education level: Not on file  Occupational History  . Not on file  Tobacco Use  . Smoking status: Current Every Day Smoker    Packs/day: 1.50    Years: 51.00    Pack years: 76.50    Types: Cigarettes  . Smokeless tobacco: Never  Used  . Tobacco comment: 1ppd currently  Substance and Sexual Activity  .  Alcohol use: No    Alcohol/week: 0.0 standard drinks  . Drug use: No  . Sexual activity: Not on file  Other Topics Concern  . Not on file  Social History Narrative  . Not on file   Social Determinants of Health   Financial Resource Strain:   . Difficulty of Paying Living Expenses: Not on file  Food Insecurity:   . Worried About Charity fundraiser in the Last Year: Not on file  . Ran Out of Food in the Last Year: Not on file  Transportation Needs:   . Lack of Transportation (Medical): Not on file  . Lack of Transportation (Non-Medical): Not on file  Physical Activity:   . Days of Exercise per Week: Not on file  . Minutes of Exercise per Session: Not on file  Stress:   . Feeling of Stress : Not on file  Social Connections:   . Frequency of Communication with Friends and Family: Not on file  . Frequency of Social Gatherings with Friends and Family: Not on file  . Attends Religious Services: Not on file  . Active Member of Clubs or Organizations: Not on file  . Attends Archivist Meetings: Not on file  . Marital Status: Not on file  Intimate Partner Violence:   . Fear of Current or Ex-Partner: Not on file  . Emotionally Abused: Not on file  . Physically Abused: Not on file  . Sexually Abused: Not on file    FAMILY HISTORY: History reviewed. No pertinent family history.  ALLERGIES:  has No Known Allergies.  MEDICATIONS:  Current Outpatient Medications  Medication Sig Dispense Refill  . atorvastatin (LIPITOR) 20 MG tablet Take 20 mg by mouth daily.    . hydrochlorothiazide (HYDRODIURIL) 25 MG tablet Take 25 mg by mouth daily.    Marland Kitchen lisinopril (ZESTRIL) 20 MG tablet Take 20 mg by mouth daily.    . metoprolol tartrate (LOPRESSOR) 50 MG tablet Take 50 mg by mouth daily.     No current facility-administered medications for this visit.     PHYSICAL EXAMINATION: ECOG PERFORMANCE STATUS: 0 - Asymptomatic Vitals:   03/24/19 1405  BP: (!) 165/93  Pulse: 69    Resp: 18  Temp: 98.1 F (36.7 C)   Filed Weights   03/24/19 1405  Weight: 207 lb 1.6 oz (93.9 kg)    Physical Exam Constitutional:      General: He is not in acute distress. HENT:     Head: Normocephalic and atraumatic.  Eyes:     General: No scleral icterus.    Pupils: Pupils are equal, round, and reactive to light.  Cardiovascular:     Rate and Rhythm: Normal rate and regular rhythm.     Heart sounds: Normal heart sounds.  Pulmonary:     Effort: Pulmonary effort is normal. No respiratory distress.     Breath sounds: No wheezing.  Abdominal:     General: Bowel sounds are normal. There is no distension.     Palpations: Abdomen is soft. There is no mass.     Tenderness: There is no abdominal tenderness.  Musculoskeletal:        General: No deformity. Normal range of motion.     Cervical back: Normal range of motion and neck supple.  Skin:    General: Skin is warm and dry.     Findings: No erythema or rash.  Neurological:  Mental Status: He is alert and oriented to person, place, and time.     Cranial Nerves: No cranial nerve deficit.     Coordination: Coordination normal.  Psychiatric:        Behavior: Behavior normal.        Thought Content: Thought content normal.     RADIOGRAPHIC STUDIES: I have personally reviewed the radiological images as listed and agreed with the findings in the report. No results found.   LABORATORY DATA:  I have reviewed the data as listed Lab Results  Component Value Date   WBC 8.0 11/30/2018   HGB 18.2 (H) 03/24/2019   HCT 55.7 (H) 03/24/2019   MCV 99.1 11/30/2018   PLT 166 11/30/2018   Recent Labs    11/30/18 1558  NA 138  K 3.7  CL 100  CO2 29  GLUCOSE 102*  BUN 8  CREATININE 0.71  CALCIUM 9.1  GFRNONAA >60  GFRAA >60  PROT 8.0  ALBUMIN 4.0  AST 21  ALT 22  ALKPHOS 70  BILITOT 0.7   Iron/TIBC/Ferritin/ %Sat No results found for: IRON, TIBC, FERRITIN, IRONPCTSAT      ASSESSMENT & PLAN:  1. Secondary  erythrocytosis   2. Personal history of tobacco use, presenting hazards to health    Labs reviewed and discussed with patient. Negative Jak 2 mutations with reflex to CARL and MPL, less likely primary erythrocytosis. Secondary erythrocytosis due to smoking.. Discussed with patient about goal of keep hematocrit less than 50. Proceed with phlebotomy 500 cc x 1 today.  Repeat another phlebotomy 500 cc x 1 in 1-2 weeks. Smoke cessation was discussed in details with patient.  He is motivated. Follow-up in 4 weeks.   Orders Placed This Encounter  Procedures  . CBC with Differential    Standing Status:   Future    Standing Expiration Date:   03/23/2020    We spent sufficient time to discuss many aspect of care, questions were answered to patient's satisfaction. The patient knows to call the clinic with any problems questions or concerns.  Cc Cletis Athens, MD  Return of visit: 3  Months.  Earlie Server, MD, PhD 03/26/2019

## 2019-03-27 ENCOUNTER — Other Ambulatory Visit: Payer: Self-pay

## 2019-03-27 NOTE — Patient Outreach (Signed)
[facility]) Care Management  03/27/2019  Joshua Schmidt 1951/03/09 [id number]   Medication Adherence call to Joshua Schmidt Compliant Voice message left with a call back number. Joshua Schmidt is showing past due on Atorvastatin 20 mg and Lisinopril 20 mg under [facility].   [place] Management Direct Dial [phone]  Fax [phone] [email or web address]

## 2019-04-03 ENCOUNTER — Other Ambulatory Visit: Payer: Self-pay

## 2019-04-03 ENCOUNTER — Inpatient Hospital Stay: Payer: Medicare Other

## 2019-04-03 VITALS — BP 131/74 | HR 67 | Temp 98.0°F | Resp 18

## 2019-04-03 DIAGNOSIS — D751 Secondary polycythemia: Secondary | ICD-10-CM

## 2019-05-01 ENCOUNTER — Inpatient Hospital Stay: Payer: Medicare Other | Admitting: Oncology

## 2019-05-01 ENCOUNTER — Inpatient Hospital Stay: Payer: Medicare Other

## 2019-05-11 ENCOUNTER — Other Ambulatory Visit: Payer: Self-pay

## 2019-05-11 NOTE — Progress Notes (Signed)
Patient pre screened for office appointment, no questions or concerns today. Patient reminded of upcoming appointment time and date. 

## 2019-05-12 ENCOUNTER — Inpatient Hospital Stay: Payer: Medicare Other

## 2019-05-12 ENCOUNTER — Inpatient Hospital Stay: Payer: Medicare Other | Attending: Oncology | Admitting: Oncology

## 2019-05-12 ENCOUNTER — Encounter: Payer: Self-pay | Admitting: Oncology

## 2019-05-12 ENCOUNTER — Other Ambulatory Visit: Payer: Self-pay

## 2019-05-12 VITALS — BP 150/85 | HR 67 | Temp 97.7°F | Resp 18 | Wt 206.3 lb

## 2019-05-12 VITALS — BP 153/75 | HR 65

## 2019-05-12 DIAGNOSIS — D751 Secondary polycythemia: Secondary | ICD-10-CM

## 2019-05-12 DIAGNOSIS — F17210 Nicotine dependence, cigarettes, uncomplicated: Secondary | ICD-10-CM | POA: Diagnosis not present

## 2019-05-12 DIAGNOSIS — I10 Essential (primary) hypertension: Secondary | ICD-10-CM | POA: Diagnosis not present

## 2019-05-12 DIAGNOSIS — Z79899 Other long term (current) drug therapy: Secondary | ICD-10-CM | POA: Diagnosis not present

## 2019-05-12 DIAGNOSIS — Z87891 Personal history of nicotine dependence: Secondary | ICD-10-CM

## 2019-05-12 DIAGNOSIS — I252 Old myocardial infarction: Secondary | ICD-10-CM | POA: Diagnosis not present

## 2019-05-12 LAB — CBC WITH DIFFERENTIAL/PLATELET
Abs Immature Granulocytes: 0.02 10*3/uL (ref 0.00–0.07)
Basophils Absolute: 0.1 10*3/uL (ref 0.0–0.1)
Basophils Relative: 1 %
Eosinophils Absolute: 0.1 10*3/uL (ref 0.0–0.5)
Eosinophils Relative: 2 %
HCT: 52.1 % — ABNORMAL HIGH (ref 39.0–52.0)
Hemoglobin: 16.9 g/dL (ref 13.0–17.0)
Immature Granulocytes: 0 %
Lymphocytes Relative: 16 %
Lymphs Abs: 1.4 10*3/uL (ref 0.7–4.0)
MCH: 31.9 pg (ref 26.0–34.0)
MCHC: 32.4 g/dL (ref 30.0–36.0)
MCV: 98.5 fL (ref 80.0–100.0)
Monocytes Absolute: 0.8 10*3/uL (ref 0.1–1.0)
Monocytes Relative: 10 %
Neutro Abs: 6.2 10*3/uL (ref 1.7–7.7)
Neutrophils Relative %: 71 %
Platelets: 232 10*3/uL (ref 150–400)
RBC: 5.29 MIL/uL (ref 4.22–5.81)
RDW: 14 % (ref 11.5–15.5)
WBC: 8.7 10*3/uL (ref 4.0–10.5)
nRBC: 0 % (ref 0.0–0.2)

## 2019-05-12 NOTE — Progress Notes (Signed)
Patient does not offer any problems today.  

## 2019-05-13 NOTE — Progress Notes (Signed)
Hematology/Oncology  Follow up note [facility] Telephone:([phone] Fax:(336) [phone]   Patient Care Team: Cletis Athens, MD as PCP - General (Internal Medicine) Lorelee Market, MD (Family Medicine) Christene Lye, MD (General Surgery) Earlie Server, MD as Consulting Physician (Oncology)  REFERRING PROVIDER: Cletis Athens, MD  CHIEF COMPLAINTS/REASON FOR VISIT:  Follow up for erythrocytosis.  HISTORY OF PRESENTING ILLNESS:  Joshua Schmidt is a 69 y.o. male who was seen in consultation at the request of Cletis Athens, MD for evaluation of polycytosis/erythrocytosis Patient had lab work done with primary care provider and was found to have high hemoglobin.  Lab results are not available to me.  Patient was referred to heme-onc for further evaluation and discussion. Associated signs or symptoms: Denies weight loss, fever, chills, fatigue, night sweats.   Context:  Smoking history: 76.5 pack year smoking history, he reports that he quitted in the past and then smoke again Testosterone supplements: Denies History of blood clots: Denies Daytime somnolence: Denies Family history of polycythemia: Denies Denies any personal history of thrombosis, stroke, heart attack.  Denies any skin itchiness.  He is a retired Clinical biochemist, remains active.  Mainly works outdoors.  INTERVAL HISTORY Joshua Schmidt is a 69 y.o. male who has above history reviewed by me today presents for follow up visit for management of erythrocytosis Problems and complaints are listed below: Patient has been on therapeutic phlebotomy intermittently for secondary erythrocytosis. Patient continues to smoke about a pack of cigarettes daily. He has reduced from 1.5 pack a day to slightly less than 1 pack. He reports feeling well. Denies any new complaints.  Review of Systems  Constitutional: Negative for appetite change, chills, diaphoresis, fatigue, fever and unexpected weight  change.  HENT:   Negative for hearing loss, lump/mass, nosebleeds, sore throat and voice change.   Eyes: Negative for eye problems and icterus.  Respiratory: Negative for chest tightness, cough, hemoptysis, shortness of breath and wheezing.   Cardiovascular: Negative for chest pain and leg swelling.  Gastrointestinal: Negative for abdominal distention, abdominal pain, blood in stool, diarrhea, nausea and rectal pain.  Endocrine: Negative for hot flashes.  Genitourinary: Negative for bladder incontinence, difficulty urinating, dysuria, frequency, hematuria and nocturia.   Musculoskeletal: Negative for arthralgias, back pain, flank pain, gait problem and myalgias.  Skin: Negative for itching and rash.  Neurological: Negative for dizziness, gait problem, headaches, light-headedness, numbness and seizures.  Hematological: Negative for adenopathy. Does not bruise/bleed easily.  Psychiatric/Behavioral: Negative for confusion and decreased concentration. The patient is not nervous/anxious.     MEDICAL HISTORY:  Past Medical History:  Diagnosis Date  . Erythrocytosis 12/01/2018  . Hypertension   . Myocardial infarction [facility])     SURGICAL HISTORY: Past Surgical History:  Procedure Laterality Date  . APPENDECTOMY    . COLONOSCOPY WITH PROPOFOL N/A 11/20/2014   Procedure: COLONOSCOPY WITH PROPOFOL;  Surgeon: Christene Lye, MD;  Location: [facility] ENDOSCOPY;  Service: Endoscopy;  Laterality: N/A;  . TONSILLECTOMY AND ADENOIDECTOMY      SOCIAL HISTORY: Social History   Socioeconomic History  . Marital status: Married    Spouse name: Not on file  . Number of children: Not on file  . Years of education: Not on file  . Highest education level: Not on file  Occupational History  . Not on file  Tobacco Use  . Smoking status: Current Every Day Smoker    Packs/day: 1.50    Years: 51.00    Pack years: 76.50  Types: Cigarettes  . Smokeless tobacco: Never Used  . Tobacco comment: 1ppd  currently  Substance and Sexual Activity  . Alcohol use: No    Alcohol/week: 0.0 standard drinks  . Drug use: No  . Sexual activity: Not on file  Other Topics Concern  . Not on file  Social History Narrative  . Not on file   Social Determinants of Health   Financial Resource Strain:   . Difficulty of Paying Living Expenses: Not on file  Food Insecurity:   . Worried About Charity fundraiser in the Last Year: Not on file  . Ran Out of Food in the Last Year: Not on file  Transportation Needs:   . Lack of Transportation (Medical): Not on file  . Lack of Transportation (Non-Medical): Not on file  Physical Activity:   . Days of Exercise per Week: Not on file  . Minutes of Exercise per Session: Not on file  Stress:   . Feeling of Stress : Not on file  Social Connections:   . Frequency of Communication with Friends and Family: Not on file  . Frequency of Social Gatherings with Friends and Family: Not on file  . Attends Religious Services: Not on file  . Active Member of Clubs or Organizations: Not on file  . Attends Archivist Meetings: Not on file  . Marital Status: Not on file  Intimate Partner Violence:   . Fear of Current or Ex-Partner: Not on file  . Emotionally Abused: Not on file  . Physically Abused: Not on file  . Sexually Abused: Not on file    FAMILY HISTORY: History reviewed. No pertinent family history.  ALLERGIES:  has No Known Allergies.  MEDICATIONS:  Current Outpatient Medications  Medication Sig Dispense Refill  . atorvastatin (LIPITOR) 20 MG tablet Take 20 mg by mouth daily.    . hydrochlorothiazide (HYDRODIURIL) 25 MG tablet Take 25 mg by mouth daily.    Marland Kitchen lisinopril (ZESTRIL) 20 MG tablet Take 20 mg by mouth daily.    . metoprolol tartrate (LOPRESSOR) 50 MG tablet Take 50 mg by mouth daily.     No current facility-administered medications for this visit.     PHYSICAL EXAMINATION: ECOG PERFORMANCE STATUS: 0 - Asymptomatic Vitals:    05/12/19 1325  BP: (!) 150/85  Pulse: 67  Resp: 18  Temp: 97.7 F (36.5 C)   Filed Weights   05/12/19 1325  Weight: 206 lb 4.8 oz (93.6 kg)    Physical Exam Constitutional:      General: He is not in acute distress. HENT:     Head: Normocephalic and atraumatic.  Eyes:     General: No scleral icterus.    Pupils: Pupils are equal, round, and reactive to light.  Cardiovascular:     Rate and Rhythm: Normal rate and regular rhythm.     Heart sounds: Normal heart sounds.  Pulmonary:     Effort: Pulmonary effort is normal. No respiratory distress.     Breath sounds: No wheezing.  Abdominal:     General: Bowel sounds are normal. There is no distension.     Palpations: Abdomen is soft. There is no mass.     Tenderness: There is no abdominal tenderness.  Musculoskeletal:        General: No deformity. Normal range of motion.     Cervical back: Normal range of motion and neck supple.  Skin:    General: Skin is warm and dry.     Findings:  No erythema or rash.  Neurological:     Mental Status: He is alert and oriented to person, place, and time. Mental status is at baseline.     Cranial Nerves: No cranial nerve deficit.     Coordination: Coordination normal.  Psychiatric:        Mood and Affect: Mood normal.        Behavior: Behavior normal.        Thought Content: Thought content normal.     RADIOGRAPHIC STUDIES: I have personally reviewed the radiological images as listed and agreed with the findings in the report. No results found.   LABORATORY DATA:  I have reviewed the data as listed Lab Results  Component Value Date   WBC 8.7 05/12/2019   HGB 16.9 05/12/2019   HCT 52.1 (H) 05/12/2019   MCV 98.5 05/12/2019   PLT 232 05/12/2019   Recent Labs    11/30/18 1558  NA 138  K 3.7  CL 100  CO2 29  GLUCOSE 102*  BUN 8  CREATININE 0.71  CALCIUM 9.1  GFRNONAA >60  GFRAA >60  PROT 8.0  ALBUMIN 4.0  AST 21  ALT 22  ALKPHOS 70  BILITOT 0.7    Iron/TIBC/Ferritin/ %Sat No results found for: IRON, TIBC, FERRITIN, IRONPCTSAT      ASSESSMENT & PLAN:  1. Erythrocytosis   2. Personal history of tobacco use, presenting hazards to health   Labs reviewed and discussed with patient. Secondary erythrocytosis due to smoking. Discussed with patient that I recommend phlebotomy if hematocrit is above 50. I encourage patient's effort in quitting smoking. Patient is motivated and will continue to try. Proceed with phlebotomy 500 cc x 1 today. H&H in 4 weeks and 8 weeks with phlebotomy if hematocrit is above 50.   Follow-up in 12 weeks.   Orders Placed This Encounter  Procedures  . Hematocrit    Standing Status:   Future    Standing Expiration Date:   05/11/2020  . Hemoglobin    Standing Status:   Future    Standing Expiration Date:   05/11/2020  . Hematocrit    Standing Status:   Future    Standing Expiration Date:   05/11/2020  . Hemoglobin    Standing Status:   Future    Standing Expiration Date:   05/11/2020  . CBC with Differential/Platelet    Standing Status:   Future    Standing Expiration Date:   05/11/2020    We spent sufficient time to discuss many aspect of care, questions were answered to patient's satisfaction. The patient knows to call the clinic with any problems questions or concerns.  Cc Cletis Athens, MD  Return of visit: 3  Months.  Earlie Server, MD, PhD 05/13/2019

## 2019-06-03 ENCOUNTER — Ambulatory Visit: Payer: Medicare Other | Attending: Internal Medicine

## 2019-06-03 DIAGNOSIS — Z23 Encounter for immunization: Secondary | ICD-10-CM | POA: Insufficient documentation

## 2019-06-03 NOTE — Progress Notes (Signed)
   Covid-19 Vaccination Clinic  Name:  Joshua Schmidt    MRN: [id number] DOB: 18-Jan-1951  06/03/2019  [name] was observed post Covid-19 immunization for 15 minutes without incidence. He was provided with Vaccine Information Sheet and instruction to access the V-Safe system.   [name] was instructed to call 911 with any severe reactions post vaccine: Marland Kitchen Difficulty breathing  . Swelling of your face and throat  . A fast heartbeat  . A bad rash all over your body  . Dizziness and weakness    Immunizations Administered    Name Date Dose VIS Date Route   Pfizer COVID-19 Vaccine 06/03/2019 12:35 PM 0.3 mL 03/24/2019 Intramuscular   Manufacturer: [place]   Lot: [id number]   [place]: [id number]

## 2019-06-09 ENCOUNTER — Inpatient Hospital Stay: Payer: Medicare Other | Attending: Oncology

## 2019-06-09 ENCOUNTER — Inpatient Hospital Stay: Payer: Medicare Other

## 2019-06-09 ENCOUNTER — Other Ambulatory Visit: Payer: Self-pay

## 2019-06-09 DIAGNOSIS — D751 Secondary polycythemia: Secondary | ICD-10-CM

## 2019-06-09 DIAGNOSIS — Z87891 Personal history of nicotine dependence: Secondary | ICD-10-CM | POA: Diagnosis not present

## 2019-06-09 LAB — HEMOGLOBIN: Hemoglobin: 16.8 g/dL (ref 13.0–17.0)

## 2019-06-09 LAB — HEMATOCRIT: HCT: 51.9 % (ref 39.0–52.0)

## 2019-06-27 ENCOUNTER — Ambulatory Visit: Payer: Medicare Other | Attending: Internal Medicine

## 2019-06-27 DIAGNOSIS — Z23 Encounter for immunization: Secondary | ICD-10-CM

## 2019-07-07 ENCOUNTER — Inpatient Hospital Stay: Payer: Medicare Other | Attending: Oncology

## 2019-07-07 ENCOUNTER — Inpatient Hospital Stay: Payer: Medicare Other

## 2019-07-07 DIAGNOSIS — Z87891 Personal history of nicotine dependence: Secondary | ICD-10-CM | POA: Diagnosis not present

## 2019-07-07 DIAGNOSIS — D751 Secondary polycythemia: Secondary | ICD-10-CM | POA: Insufficient documentation

## 2019-07-07 LAB — HEMOGLOBIN: Hemoglobin: 15.7 g/dL (ref 13.0–17.0)

## 2019-07-07 LAB — HEMATOCRIT: HCT: 48.1 % (ref 39.0–52.0)

## 2019-08-04 ENCOUNTER — Inpatient Hospital Stay (HOSPITAL_BASED_OUTPATIENT_CLINIC_OR_DEPARTMENT_OTHER): Payer: Medicare Other | Admitting: Oncology

## 2019-08-04 ENCOUNTER — Other Ambulatory Visit: Payer: Self-pay

## 2019-08-04 ENCOUNTER — Encounter: Payer: Self-pay | Admitting: Oncology

## 2019-08-04 ENCOUNTER — Inpatient Hospital Stay: Payer: Medicare Other

## 2019-08-04 ENCOUNTER — Inpatient Hospital Stay: Payer: Medicare Other | Attending: Oncology

## 2019-08-04 VITALS — BP 157/90 | HR 63 | Temp 97.6°F | Resp 18 | Wt 203.9 lb

## 2019-08-04 DIAGNOSIS — I252 Old myocardial infarction: Secondary | ICD-10-CM | POA: Insufficient documentation

## 2019-08-04 DIAGNOSIS — D751 Secondary polycythemia: Secondary | ICD-10-CM

## 2019-08-04 DIAGNOSIS — I10 Essential (primary) hypertension: Secondary | ICD-10-CM | POA: Diagnosis not present

## 2019-08-04 DIAGNOSIS — Z79899 Other long term (current) drug therapy: Secondary | ICD-10-CM | POA: Diagnosis not present

## 2019-08-04 DIAGNOSIS — F17210 Nicotine dependence, cigarettes, uncomplicated: Secondary | ICD-10-CM | POA: Diagnosis not present

## 2019-08-04 LAB — CBC WITH DIFFERENTIAL/PLATELET
Abs Immature Granulocytes: 0.03 10*3/uL (ref 0.00–0.07)
Basophils Absolute: 0.1 10*3/uL (ref 0.0–0.1)
Basophils Relative: 1 %
Eosinophils Absolute: 0.2 10*3/uL (ref 0.0–0.5)
Eosinophils Relative: 2 %
HCT: 49.8 % (ref 39.0–52.0)
Hemoglobin: 16.8 g/dL (ref 13.0–17.0)
Immature Granulocytes: 0 %
Lymphocytes Relative: 15 %
Lymphs Abs: 1.2 10*3/uL (ref 0.7–4.0)
MCH: 32 pg (ref 26.0–34.0)
MCHC: 33.7 g/dL (ref 30.0–36.0)
MCV: 94.9 fL (ref 80.0–100.0)
Monocytes Absolute: 0.7 10*3/uL (ref 0.1–1.0)
Monocytes Relative: 9 %
Neutro Abs: 6 10*3/uL (ref 1.7–7.7)
Neutrophils Relative %: 73 %
Platelets: 216 10*3/uL (ref 150–400)
RBC: 5.25 MIL/uL (ref 4.22–5.81)
RDW: 16 % — ABNORMAL HIGH (ref 11.5–15.5)
WBC: 8.2 10*3/uL (ref 4.0–10.5)
nRBC: 0 % (ref 0.0–0.2)

## 2019-08-04 NOTE — Progress Notes (Signed)
Patient does not offer any problems today.  

## 2019-08-05 NOTE — Progress Notes (Signed)
Hematology/Oncology  Follow up note [facility] [phone] Fax:([phone]   Patient Care Team: Cletis Athens, MD as PCP - General (Internal Medicine) Lorelee Market, MD (Family Medicine) Christene Lye, MD (General Surgery) Earlie Server, MD as Consulting Physician (Oncology)  REFERRING PROVIDER: Cletis Athens, MD  CHIEF COMPLAINTS/REASON FOR VISIT:  Follow up for erythrocytosis.  HISTORY OF PRESENTING ILLNESS:  Joshua Schmidt is a 69 y.o. male who was seen in consultation at the request of Cletis Athens, MD for evaluation of polycytosis/erythrocytosis Patient had lab work done with primary care provider and was found to have high hemoglobin.  Lab results are not available to me.  Patient was referred to heme-onc for further evaluation and discussion. Associated signs or symptoms: Denies weight loss, fever, chills, fatigue, night sweats.   Context:  Smoking history: 76.5 pack year smoking history, he reports that he quitted in the past and then smoke again Testosterone supplements: Denies History of blood clots: Denies Daytime somnolence: Denies Family history of polycythemia: Denies Denies any personal history of thrombosis, stroke, heart attack.  Denies any skin itchiness.  He is a retired Clinical biochemist, remains active.  Mainly works outdoors.  INTERVAL HISTORY Joshua Schmidt is a 69 y.o. male who has above history reviewed by me today presents for follow up visit for management of erythrocytosis Problems and complaints are listed below: Patient has been on therapeutic phlebotomy intermittently for secondary erythrocytosis. He continues his effort in smoke cessation. And now he smokes 18 cigarets.    Review of Systems  Constitutional: Negative for appetite change, chills, diaphoresis, fatigue, fever and unexpected weight change.  HENT:   Negative for hearing loss, lump/mass, nosebleeds, sore throat and voice change.     Eyes: Negative for eye problems and icterus.  Respiratory: Negative for chest tightness, cough, hemoptysis, shortness of breath and wheezing.   Cardiovascular: Negative for chest pain and leg swelling.  Gastrointestinal: Negative for abdominal distention, abdominal pain, blood in stool, diarrhea, nausea and rectal pain.  Endocrine: Negative for hot flashes.  Genitourinary: Negative for bladder incontinence, difficulty urinating, dysuria, frequency, hematuria and nocturia.   Musculoskeletal: Negative for arthralgias, back pain, flank pain, gait problem and myalgias.  Skin: Negative for itching and rash.  Neurological: Negative for dizziness, gait problem, headaches, light-headedness, numbness and seizures.  Hematological: Negative for adenopathy. Does not bruise/bleed easily.  Psychiatric/Behavioral: Negative for confusion and decreased concentration. The patient is not nervous/anxious.     MEDICAL HISTORY:  Past Medical History:  Diagnosis Date  . Erythrocytosis 12/01/2018  . Hypertension   . Myocardial infarction [facility])     SURGICAL HISTORY: Past Surgical History:  Procedure Laterality Date  . APPENDECTOMY    . COLONOSCOPY WITH PROPOFOL N/A 11/20/2014   Procedure: COLONOSCOPY WITH PROPOFOL;  Surgeon: Christene Lye, MD;  Location: [facility] ENDOSCOPY;  Service: Endoscopy;  Laterality: N/A;  . TONSILLECTOMY AND ADENOIDECTOMY      SOCIAL HISTORY: Social History   Socioeconomic History  . Marital status: Married    Spouse name: Not on file  . Number of children: Not on file  . Years of education: Not on file  . Highest education level: Not on file  Occupational History  . Not on file  Tobacco Use  . Smoking status: Current Every Day Smoker    Packs/day: 1.50    Years: 51.00    Pack years: 76.50    Types: Cigarettes  . Smokeless tobacco: Never Used  . Tobacco comment: 1ppd currently  Substance and Sexual Activity  . Alcohol use: No    Alcohol/week: 0.0 standard drinks   . Drug use: No  . Sexual activity: Not on file  Other Topics Concern  . Not on file  Social History Narrative  . Not on file   Social Determinants of Health   Financial Resource Strain:   . Difficulty of Paying Living Expenses:   Food Insecurity:   . Worried About Charity fundraiser in the Last Year:   . Arboriculturist in the Last Year:   Transportation Needs:   . Film/video editor (Medical):   Marland Kitchen Lack of Transportation (Non-Medical):   Physical Activity:   . Days of Exercise per Week:   . Minutes of Exercise per Session:   Stress:   . Feeling of Stress :   Social Connections:   . Frequency of Communication with Friends and Family:   . Frequency of Social Gatherings with Friends and Family:   . Attends Religious Services:   . Active Member of Clubs or Organizations:   . Attends Archivist Meetings:   Marland Kitchen Marital Status:   Intimate Partner Violence:   . Fear of Current or Ex-Partner:   . Emotionally Abused:   Marland Kitchen Physically Abused:   . Sexually Abused:     FAMILY HISTORY: History reviewed. No pertinent family history.  ALLERGIES:  has No Known Allergies.  MEDICATIONS:  Current Outpatient Medications  Medication Sig Dispense Refill  . atorvastatin (LIPITOR) 20 MG tablet Take 20 mg by mouth daily.    . hydrochlorothiazide (HYDRODIURIL) 25 MG tablet Take 25 mg by mouth daily.    Marland Kitchen lisinopril (ZESTRIL) 20 MG tablet Take 20 mg by mouth daily.    . metoprolol tartrate (LOPRESSOR) 50 MG tablet Take 50 mg by mouth daily.     No current facility-administered medications for this visit.     PHYSICAL EXAMINATION: ECOG PERFORMANCE STATUS: 0 - Asymptomatic Vitals:   08/04/19 1331  BP: (!) 157/90  Pulse: 63  Resp: 18  Temp: 97.6 F (36.4 C)   Filed Weights   08/04/19 1331  Weight: 203 lb 14.4 oz (92.5 kg)    Physical Exam Constitutional:      General: He is not in acute distress. HENT:     Head: Normocephalic and atraumatic.  Eyes:     General:  No scleral icterus.    Pupils: Pupils are equal, round, and reactive to light.  Cardiovascular:     Rate and Rhythm: Normal rate and regular rhythm.     Heart sounds: Normal heart sounds.  Pulmonary:     Effort: Pulmonary effort is normal. No respiratory distress.     Breath sounds: No wheezing.  Abdominal:     General: Bowel sounds are normal. There is no distension.     Palpations: Abdomen is soft. There is no mass.     Tenderness: There is no abdominal tenderness.  Musculoskeletal:        General: No deformity. Normal range of motion.     Cervical back: Normal range of motion and neck supple.  Skin:    General: Skin is warm and dry.     Findings: No erythema or rash.  Neurological:     Mental Status: He is alert and oriented to person, place, and time. Mental status is at baseline.     Cranial Nerves: No cranial nerve deficit.     Coordination: Coordination normal.  Psychiatric:  Mood and Affect: Mood normal.        Behavior: Behavior normal.        Thought Content: Thought content normal.     RADIOGRAPHIC STUDIES: I have personally reviewed the radiological images as listed and agreed with the findings in the report. No results found.   LABORATORY DATA:  I have reviewed the data as listed Lab Results  Component Value Date   WBC 8.2 08/04/2019   HGB 16.8 08/04/2019   HCT 49.8 08/04/2019   MCV 94.9 08/04/2019   PLT 216 08/04/2019   Recent Labs    11/30/18 1558  NA 138  K 3.7  CL 100  CO2 29  GLUCOSE 102*  BUN 8  CREATININE 0.71  CALCIUM 9.1  GFRNONAA >60  GFRAA >60  PROT 8.0  ALBUMIN 4.0  AST 21  ALT 22  ALKPHOS 70  BILITOT 0.7   Iron/TIBC/Ferritin/ %Sat No results found for: IRON, TIBC, FERRITIN, IRONPCTSAT      ASSESSMENT & PLAN:  1. Erythrocytosis   Labs reviewed and discussed with patient. Secondary erythrocytosis due to smoking. Hct is less than 50, I will hold phlebotomy.  I encourage his continued effort in smoke cessation.    H&H in 6 wee with phlebotomy if hematocrit is above 50.  Follow up in 12 weeks. .   Orders Placed This Encounter  Procedures  . CBC with Differential/Platelet    Standing Status:   Future    Standing Expiration Date:   08/03/2020    We spent sufficient time to discuss many aspect of care, questions were answered to patient's satisfaction. The patient knows to call the clinic with any problems questions or concerns.  Cc Cletis Athens, MD  Return of visit: 3  Months.  Earlie Server, MD, PhD 08/05/2019

## 2019-09-15 ENCOUNTER — Inpatient Hospital Stay: Payer: Medicare Other | Attending: Oncology

## 2019-09-15 ENCOUNTER — Inpatient Hospital Stay: Payer: Medicare Other

## 2019-09-15 ENCOUNTER — Other Ambulatory Visit: Payer: Self-pay

## 2019-09-15 DIAGNOSIS — F17210 Nicotine dependence, cigarettes, uncomplicated: Secondary | ICD-10-CM | POA: Diagnosis not present

## 2019-09-15 DIAGNOSIS — D751 Secondary polycythemia: Secondary | ICD-10-CM | POA: Insufficient documentation

## 2019-09-15 LAB — CBC WITH DIFFERENTIAL/PLATELET
Abs Immature Granulocytes: 0.03 10*3/uL (ref 0.00–0.07)
Basophils Absolute: 0.1 10*3/uL (ref 0.0–0.1)
Basophils Relative: 1 %
Eosinophils Absolute: 0.1 10*3/uL (ref 0.0–0.5)
Eosinophils Relative: 2 %
HCT: 49.4 % (ref 39.0–52.0)
Hemoglobin: 17 g/dL (ref 13.0–17.0)
Immature Granulocytes: 0 %
Lymphocytes Relative: 16 %
Lymphs Abs: 1.4 10*3/uL (ref 0.7–4.0)
MCH: 32.4 pg (ref 26.0–34.0)
MCHC: 34.4 g/dL (ref 30.0–36.0)
MCV: 94.1 fL (ref 80.0–100.0)
Monocytes Absolute: 0.8 10*3/uL (ref 0.1–1.0)
Monocytes Relative: 9 %
Neutro Abs: 6.2 10*3/uL (ref 1.7–7.7)
Neutrophils Relative %: 72 %
Platelets: 219 10*3/uL (ref 150–400)
RBC: 5.25 MIL/uL (ref 4.22–5.81)
RDW: 16.7 % — ABNORMAL HIGH (ref 11.5–15.5)
WBC: 8.6 10*3/uL (ref 4.0–10.5)
nRBC: 0 % (ref 0.0–0.2)

## 2019-10-14 ENCOUNTER — Other Ambulatory Visit: Payer: Self-pay

## 2019-10-14 MED ORDER — HYDROCHLOROTHIAZIDE 25 MG PO TABS: 25.0000 mg | ORAL_TABLET | Freq: Every day | ORAL | 3 refills | Status: DC

## 2019-10-18 ENCOUNTER — Other Ambulatory Visit: Payer: Self-pay | Admitting: *Deleted

## 2019-10-18 MED ORDER — LISINOPRIL 20 MG PO TABS: 20.0000 mg | ORAL_TABLET | Freq: Every day | ORAL | 3 refills | Status: DC

## 2019-10-18 MED ORDER — METOPROLOL TARTRATE 50 MG PO TABS: 50.0000 mg | ORAL_TABLET | Freq: Every day | ORAL | 3 refills | Status: DC

## 2019-10-18 MED ORDER — ATORVASTATIN CALCIUM 20 MG PO TABS: 20.0000 mg | ORAL_TABLET | Freq: Every day | ORAL | 3 refills | Status: DC

## 2019-10-23 ENCOUNTER — Telehealth: Payer: Self-pay

## 2019-10-23 DIAGNOSIS — Z87891 Personal history of nicotine dependence: Secondary | ICD-10-CM

## 2019-10-23 DIAGNOSIS — Z122 Encounter for screening for malignant neoplasm of respiratory organs: Secondary | ICD-10-CM

## 2019-10-23 NOTE — Telephone Encounter (Signed)
Message left notifying patient that it is time to schedule the low dose lung cancer screening CT scan.  Instructed patient to return call to Shawn Perkins at [phone] to verify information prior to CT scan being scheduled.    

## 2019-10-25 NOTE — Addendum Note (Signed)
Addended by: Lieutenant Diego on: 10/25/2019 11:24 AM   Modules accepted: Orders

## 2019-10-25 NOTE — Telephone Encounter (Signed)
Patient has been notified that annual lung cancer screening low dose CT scan is due currently or will be in near future. Confirmed that patient is within the age range of 55-77, and asymptomatic, (no signs or symptoms of lung cancer). Patient denies illness that would prevent curative treatment for lung cancer if found. Verified smoking history, (current, 77.5 pack year). The shared decision making visit was done 10/27/18. Patient is agreeable for CT scan being scheduled.

## 2019-11-02 ENCOUNTER — Other Ambulatory Visit: Payer: Self-pay

## 2019-11-02 ENCOUNTER — Ambulatory Visit
Admission: RE | Admit: 2019-11-02 | Discharge: 2019-11-02 | Disposition: A | Discharge status: Home / Self Care | Payer: Medicare Other | Source: Ambulatory Visit | Attending: Nurse Practitioner | Admitting: Nurse Practitioner

## 2019-11-02 DIAGNOSIS — Z87891 Personal history of nicotine dependence: Secondary | ICD-10-CM | POA: Diagnosis present

## 2019-11-02 DIAGNOSIS — Z122 Encounter for screening for malignant neoplasm of respiratory organs: Secondary | ICD-10-CM | POA: Diagnosis present

## 2019-11-06 ENCOUNTER — Other Ambulatory Visit: Payer: Self-pay

## 2019-11-06 ENCOUNTER — Encounter: Payer: Self-pay | Admitting: *Deleted

## 2019-11-06 DIAGNOSIS — D751 Secondary polycythemia: Secondary | ICD-10-CM

## 2019-11-07 ENCOUNTER — Inpatient Hospital Stay (HOSPITAL_BASED_OUTPATIENT_CLINIC_OR_DEPARTMENT_OTHER): Payer: Medicare Other | Admitting: Oncology

## 2019-11-07 ENCOUNTER — Inpatient Hospital Stay: Payer: Medicare Other | Attending: Oncology

## 2019-11-07 ENCOUNTER — Other Ambulatory Visit: Payer: Self-pay

## 2019-11-07 ENCOUNTER — Encounter: Payer: Self-pay | Admitting: Oncology

## 2019-11-07 ENCOUNTER — Inpatient Hospital Stay: Payer: Medicare Other

## 2019-11-07 VITALS — BP 165/97 | HR 62 | Temp 97.9°F | Resp 18 | Wt 207.1 lb

## 2019-11-07 VITALS — BP 157/99 | HR 66 | Resp 18

## 2019-11-07 DIAGNOSIS — I10 Essential (primary) hypertension: Secondary | ICD-10-CM | POA: Insufficient documentation

## 2019-11-07 DIAGNOSIS — D751 Secondary polycythemia: Secondary | ICD-10-CM | POA: Diagnosis present

## 2019-11-07 DIAGNOSIS — I2510 Atherosclerotic heart disease of native coronary artery without angina pectoris: Secondary | ICD-10-CM | POA: Diagnosis not present

## 2019-11-07 DIAGNOSIS — J439 Emphysema, unspecified: Secondary | ICD-10-CM | POA: Diagnosis not present

## 2019-11-07 DIAGNOSIS — Z87891 Personal history of nicotine dependence: Secondary | ICD-10-CM | POA: Diagnosis not present

## 2019-11-07 DIAGNOSIS — F17210 Nicotine dependence, cigarettes, uncomplicated: Secondary | ICD-10-CM | POA: Insufficient documentation

## 2019-11-07 DIAGNOSIS — I252 Old myocardial infarction: Secondary | ICD-10-CM | POA: Insufficient documentation

## 2019-11-07 DIAGNOSIS — Z79899 Other long term (current) drug therapy: Secondary | ICD-10-CM | POA: Insufficient documentation

## 2019-11-07 DIAGNOSIS — Z7982 Long term (current) use of aspirin: Secondary | ICD-10-CM | POA: Diagnosis not present

## 2019-11-07 LAB — CBC WITH DIFFERENTIAL/PLATELET
Abs Immature Granulocytes: 0.02 10*3/uL (ref 0.00–0.07)
Basophils Absolute: 0.1 10*3/uL (ref 0.0–0.1)
Basophils Relative: 1 %
Eosinophils Absolute: 0.2 10*3/uL (ref 0.0–0.5)
Eosinophils Relative: 2 %
HCT: 50.9 % (ref 39.0–52.0)
Hemoglobin: 17.5 g/dL — ABNORMAL HIGH (ref 13.0–17.0)
Immature Granulocytes: 0 %
Lymphocytes Relative: 12 %
Lymphs Abs: 1.1 10*3/uL (ref 0.7–4.0)
MCH: 33.5 pg (ref 26.0–34.0)
MCHC: 34.4 g/dL (ref 30.0–36.0)
MCV: 97.3 fL (ref 80.0–100.0)
Monocytes Absolute: 0.7 10*3/uL (ref 0.1–1.0)
Monocytes Relative: 8 %
Neutro Abs: 7.1 10*3/uL (ref 1.7–7.7)
Neutrophils Relative %: 77 %
Platelets: 188 10*3/uL (ref 150–400)
RBC: 5.23 MIL/uL (ref 4.22–5.81)
RDW: 15.5 % (ref 11.5–15.5)
WBC: 9.2 10*3/uL (ref 4.0–10.5)
nRBC: 0 % (ref 0.0–0.2)

## 2019-11-07 NOTE — Progress Notes (Signed)
Hematology/Oncology  Follow up note [facility] Telephone:(336) [phone] Fax:(336) [phone]   Patient Care Team: Cletis Athens, MD as PCP - General (Internal Medicine) Lorelee Market, MD (Family Medicine) Christene Lye, MD (General Surgery) Earlie Server, MD as Consulting Physician (Oncology)  REFERRING PROVIDER: Cletis Athens, MD  CHIEF COMPLAINTS/REASON FOR VISIT:  Follow up for erythrocytosis.  HISTORY OF PRESENTING ILLNESS:  Joshua Schmidt is a 69 y.o. male who was seen in consultation at the request of Cletis Athens, MD for evaluation of polycytosis/erythrocytosis Patient had lab work done with primary care provider and was found to have high hemoglobin.  Lab results are not available to me.  Patient was referred to heme-onc for further evaluation and discussion. Associated signs or symptoms: Denies weight loss, fever, chills, fatigue, night sweats.   Context:  Smoking history: 76.5 pack year smoking history, he reports that he quitted in the past and then smoke again Testosterone supplements: Denies History of blood clots: Denies Daytime somnolence: Denies Family history of polycythemia: Denies Denies any personal history of thrombosis, stroke, heart attack.  Denies any skin itchiness.  He is a retired Clinical biochemist, remains active.  Mainly works outdoors.  INTERVAL HISTORY Joshua Schmidt is a 69 y.o. male who has above history reviewed by me today presents for follow up visit for management of erythrocytosis Problems and complaints are listed below: Patient has been on therapeutic phlebotomy intermittently for secondary erythrocytosis.  He continues to smoke cigarettes daily.  On average about 18 cigarettes/day. Denies any new complaints.   Review of Systems  Constitutional: Negative for appetite change, chills, diaphoresis, fatigue, fever and unexpected weight change.  HENT:   Negative for hearing loss, lump/mass, nosebleeds, sore  throat and voice change.   Eyes: Negative for eye problems and icterus.  Respiratory: Negative for chest tightness, cough, hemoptysis, shortness of breath and wheezing.   Cardiovascular: Negative for chest pain and leg swelling.  Gastrointestinal: Negative for abdominal distention, abdominal pain, blood in stool, diarrhea, nausea and rectal pain.  Endocrine: Negative for hot flashes.  Genitourinary: Negative for bladder incontinence, difficulty urinating, dysuria, frequency, hematuria and nocturia.   Musculoskeletal: Negative for arthralgias, back pain, flank pain, gait problem and myalgias.  Skin: Negative for itching and rash.  Neurological: Negative for dizziness, gait problem, headaches, light-headedness, numbness and seizures.  Hematological: Negative for adenopathy. Does not bruise/bleed easily.  Psychiatric/Behavioral: Negative for confusion and decreased concentration. The patient is not nervous/anxious.     MEDICAL HISTORY:  Past Medical History:  Diagnosis Date  . Erythrocytosis 12/01/2018  . Hypertension   . Myocardial infarction [facility])     SURGICAL HISTORY: Past Surgical History:  Procedure Laterality Date  . APPENDECTOMY    . COLONOSCOPY WITH PROPOFOL N/A 11/20/2014   Procedure: COLONOSCOPY WITH PROPOFOL;  Surgeon: Christene Lye, MD;  Location: [facility] ENDOSCOPY;  Service: Endoscopy;  Laterality: N/A;  . TONSILLECTOMY AND ADENOIDECTOMY      SOCIAL HISTORY: Social History   Socioeconomic History  . Marital status: Married    Spouse name: Not on file  . Number of children: Not on file  . Years of education: Not on file  . Highest education level: Not on file  Occupational History  . Not on file  Tobacco Use  . Smoking status: Current Every Day Smoker    Packs/day: 1.50    Years: 51.00    Pack years: 76.50    Types: Cigarettes  . Smokeless tobacco: Never Used  . Tobacco comment:  1ppd currently  Substance and Sexual Activity  . Alcohol use: No     Alcohol/week: 0.0 standard drinks  . Drug use: No  . Sexual activity: Not on file  Other Topics Concern  . Not on file  Social History Narrative  . Not on file   Social Determinants of Health   Financial Resource Strain:   . Difficulty of Paying Living Expenses:   Food Insecurity:   . Worried About Charity fundraiser in the Last Year:   . Arboriculturist in the Last Year:   Transportation Needs:   . Film/video editor (Medical):   Marland Kitchen Lack of Transportation (Non-Medical):   Physical Activity:   . Days of Exercise per Week:   . Minutes of Exercise per Session:   Stress:   . Feeling of Stress :   Social Connections:   . Frequency of Communication with Friends and Family:   . Frequency of Social Gatherings with Friends and Family:   . Attends Religious Services:   . Active Member of Clubs or Organizations:   . Attends Archivist Meetings:   Marland Kitchen Marital Status:   Intimate Partner Violence:   . Fear of Current or Ex-Partner:   . Emotionally Abused:   Marland Kitchen Physically Abused:   . Sexually Abused:     FAMILY HISTORY: History reviewed. No pertinent family history.  ALLERGIES:  has No Known Allergies.  MEDICATIONS:  Current Outpatient Medications  Medication Sig Dispense Refill  . atorvastatin (LIPITOR) 20 MG tablet Take 1 tablet (20 mg total) by mouth daily. 90 tablet 3  . hydrochlorothiazide (HYDRODIURIL) 25 MG tablet Take 1 tablet (25 mg total) by mouth daily. 90 tablet 3  . lisinopril (ZESTRIL) 20 MG tablet Take 1 tablet (20 mg total) by mouth daily. 90 tablet 3  . metoprolol tartrate (LOPRESSOR) 50 MG tablet Take 1 tablet (50 mg total) by mouth daily. 90 tablet 3   No current facility-administered medications for this visit.     PHYSICAL EXAMINATION: ECOG PERFORMANCE STATUS: 0 - Asymptomatic Vitals:   11/07/19 1305  BP: (!) 165/97  Pulse: 62  Resp: 18  Temp: 97.9 F (36.6 C)   Filed Weights   11/07/19 1305  Weight: (!) 207 lb 1.6 oz (93.9 kg)     Physical Exam Constitutional:      General: He is not in acute distress. HENT:     Head: Normocephalic and atraumatic.  Eyes:     General: No scleral icterus.    Pupils: Pupils are equal, round, and reactive to light.  Cardiovascular:     Rate and Rhythm: Normal rate and regular rhythm.     Heart sounds: Normal heart sounds.  Pulmonary:     Effort: Pulmonary effort is normal. No respiratory distress.     Breath sounds: No wheezing.  Abdominal:     General: Bowel sounds are normal. There is no distension.     Palpations: Abdomen is soft. There is no mass.     Tenderness: There is no abdominal tenderness.  Musculoskeletal:        General: No deformity. Normal range of motion.     Cervical back: Normal range of motion and neck supple.  Skin:    General: Skin is warm and dry.     Findings: No erythema or rash.  Neurological:     Mental Status: He is alert and oriented to person, place, and time. Mental status is at baseline.  Cranial Nerves: No cranial nerve deficit.     Coordination: Coordination normal.  Psychiatric:        Mood and Affect: Mood normal.        Behavior: Behavior normal.        Thought Content: Thought content normal.     RADIOGRAPHIC STUDIES: I have personally reviewed the radiological images as listed and agreed with the findings in the report. CT CHEST LUNG CANCER SCREENING LOW DOSE WO CONTRAST  Result Date: 11/03/2019 CLINICAL DATA:  69 year old male current smoker with 77 pack-year history of smoking. Lung cancer screening examination. EXAM: CT CHEST WITHOUT CONTRAST LOW-DOSE FOR LUNG CANCER SCREENING TECHNIQUE: Multidetector CT imaging of the chest was performed following the standard protocol without IV contrast. COMPARISON:  Chest CT 10/27/2018. FINDINGS: Cardiovascular: Heart size is normal. There is no significant pericardial fluid, thickening or pericardial calcification. There is aortic atherosclerosis, as well as atherosclerosis of the great  vessels of the mediastinum and the coronary arteries, including calcified atherosclerotic plaque in the left main, left anterior descending, left circumflex and right coronary arteries. Calcifications of the mitral annulus. Mediastinum/Nodes: No pathologically enlarged mediastinal or hilar lymph nodes. Please note that accurate exclusion of hilar adenopathy is limited on noncontrast CT scans. Esophagus is unremarkable in appearance. No axillary lymphadenopathy. Lungs/Pleura: Multiple small pulmonary nodules are noted in the lungs bilaterally, largest of which is in the posterior aspect of the right lower lobe (axial image 211 of series 3), with a volume derived mean diameter 5.1 mm. No other larger more suspicious appearing pulmonary nodules or masses are noted. No acute consolidative airspace disease. No pleural effusions. Mild diffuse bronchial wall thickening with very mild centrilobular and paraseptal emphysema. Upper Abdomen: Aortic atherosclerosis. Low-attenuation lesion in the interpolar region of the right kidney measuring 3.5 cm in diameter, incompletely characterized on today's non-contrast CT examination, but statistically likely to represent a cyst. Musculoskeletal: Well-defined 1.9 x 1.3 cm low-intermediate attenuation lesion in the subcutaneous fat of the right anterior chest wall (axial image 67 of series 2), stable compared to prior examination, likely to represent a sebaceous cyst. There are no aggressive appearing lytic or blastic lesions noted in the visualized portions of the skeleton. IMPRESSION: 1. Lung-RADS 2S, benign appearance or behavior. Continue annual screening with low-dose chest CT without contrast in 12 months. 2. The "S" modifier above refers to potentially clinically significant non lung cancer related findings. Specifically, there is aortic atherosclerosis, in addition to left main and 3 vessel coronary artery disease. Please note that although the presence of coronary artery  calcium documents the presence of coronary artery disease, the severity of this disease and any potential stenosis cannot be assessed on this non-gated CT examination. Assessment for potential risk factor modification, dietary therapy or pharmacologic therapy may be warranted, if clinically indicated. 3. Mild diffuse bronchial wall thickening with very mild centrilobular and paraseptal emphysema; imaging findings suggestive of underlying COPD. 4. There are calcifications of the mitral annulus. Echocardiographic correlation for evaluation of potential valvular dysfunction may be warranted if clinically indicated. Aortic Atherosclerosis (ICD10-I70.0) and Emphysema (ICD10-J43.9). Electronically Signed   By: Vinnie Langton M.D.   On: 11/03/2019 09:33     LABORATORY DATA:  I have reviewed the data as listed Lab Results  Component Value Date   WBC 9.2 11/07/2019   HGB 17.5 (H) 11/07/2019   HCT 50.9 11/07/2019   MCV 97.3 11/07/2019   PLT 188 11/07/2019   Recent Labs    11/30/18 1558  NA 138  K 3.7  CL 100  CO2 29  GLUCOSE 102*  BUN 8  CREATININE 0.71  CALCIUM 9.1  GFRNONAA >60  GFRAA >60  PROT 8.0  ALBUMIN 4.0  AST 21  ALT 22  ALKPHOS 70  BILITOT 0.7   Iron/TIBC/Ferritin/ %Sat No results found for: IRON, TIBC, FERRITIN, IRONPCTSAT      ASSESSMENT & PLAN:  1. Erythrocytosis   2. Personal history of tobacco use, presenting hazards to health    #Secondary erythrocytosis, Labs are reviewed and discussed with patient Hematocrit 50.9 today.  I recommend patient to proceed with phlebotomy today.  #Tobacco use, erythrocytosis is likely secondary to smoking. Smoke cessation was discussed.  I encouraged his cessation efforts.  #Patient had a lung cancer screening on 11/02/2019. CT done Findings were discussed with patient. Continue follow-up with lung cancer screening program. None lung related findings were discussed with patient. Aortic atherosclerosis in addition to left main  and three-vessel coronary artery disease.  Calcification of the mitral annulus.  Patient is on aspirin.  I recommend patient to discuss with primary care provider for the need of echocardiogram.   Follow up in 3 months.   Orders Placed This Encounter  Procedures  . CBC with Differential/Platelet    Standing Status:   Future    Standing Expiration Date:   11/06/2020    We spent sufficient time to discuss many aspect of care, questions were answered to patient's satisfaction. The patient knows to call the clinic with any problems questions or concerns.  Return of visit: 3  Months.  Earlie Server, MD, PhD 11/07/2019

## 2019-11-07 NOTE — Progress Notes (Signed)
Pt here for follow up. No new concerns voiced.   

## 2020-02-05 ENCOUNTER — Other Ambulatory Visit: Payer: Self-pay

## 2020-02-05 ENCOUNTER — Inpatient Hospital Stay: Payer: Medicare Other | Attending: Oncology | Admitting: Oncology

## 2020-02-05 ENCOUNTER — Inpatient Hospital Stay: Payer: Medicare Other

## 2020-02-05 ENCOUNTER — Encounter: Payer: Self-pay | Admitting: Oncology

## 2020-02-05 VITALS — BP 154/90 | HR 66 | Temp 98.2°F | Resp 18 | Wt 206.1 lb

## 2020-02-05 DIAGNOSIS — Z79899 Other long term (current) drug therapy: Secondary | ICD-10-CM | POA: Insufficient documentation

## 2020-02-05 DIAGNOSIS — Z87891 Personal history of nicotine dependence: Secondary | ICD-10-CM | POA: Diagnosis not present

## 2020-02-05 DIAGNOSIS — F17210 Nicotine dependence, cigarettes, uncomplicated: Secondary | ICD-10-CM | POA: Insufficient documentation

## 2020-02-05 DIAGNOSIS — Z9049 Acquired absence of other specified parts of digestive tract: Secondary | ICD-10-CM | POA: Insufficient documentation

## 2020-02-05 DIAGNOSIS — I10 Essential (primary) hypertension: Secondary | ICD-10-CM | POA: Diagnosis not present

## 2020-02-05 DIAGNOSIS — D751 Secondary polycythemia: Secondary | ICD-10-CM | POA: Diagnosis present

## 2020-02-05 DIAGNOSIS — I252 Old myocardial infarction: Secondary | ICD-10-CM | POA: Insufficient documentation

## 2020-02-05 LAB — CBC WITH DIFFERENTIAL/PLATELET
Abs Immature Granulocytes: 0.02 10*3/uL (ref 0.00–0.07)
Basophils Absolute: 0.1 10*3/uL (ref 0.0–0.1)
Basophils Relative: 1 %
Eosinophils Absolute: 0.1 10*3/uL (ref 0.0–0.5)
Eosinophils Relative: 1 %
HCT: 49.2 % (ref 39.0–52.0)
Hemoglobin: 17.2 g/dL — ABNORMAL HIGH (ref 13.0–17.0)
Immature Granulocytes: 0 %
Lymphocytes Relative: 13 %
Lymphs Abs: 1.1 10*3/uL (ref 0.7–4.0)
MCH: 33.7 pg (ref 26.0–34.0)
MCHC: 35 g/dL (ref 30.0–36.0)
MCV: 96.3 fL (ref 80.0–100.0)
Monocytes Absolute: 0.6 10*3/uL (ref 0.1–1.0)
Monocytes Relative: 7 %
Neutro Abs: 6.7 10*3/uL (ref 1.7–7.7)
Neutrophils Relative %: 78 %
Platelets: 218 10*3/uL (ref 150–400)
RBC: 5.11 MIL/uL (ref 4.22–5.81)
RDW: 13.7 % (ref 11.5–15.5)
WBC: 8.6 10*3/uL (ref 4.0–10.5)
nRBC: 0 % (ref 0.0–0.2)

## 2020-02-05 NOTE — Progress Notes (Signed)
Hematology/Oncology  Follow up note [facility] Telephone:(336) [phone] Fax:(336) [phone]   Patient Care Team: Cletis Athens, MD as PCP - General (Internal Medicine) Lorelee Market, MD (Family Medicine) Christene Lye, MD (General Surgery) Earlie Server, MD as Consulting Physician (Oncology)  REFERRING PROVIDER: Cletis Athens, MD  CHIEF COMPLAINTS/REASON FOR VISIT:  Follow up for erythrocytosis.  HISTORY OF PRESENTING ILLNESS:  Joshua Schmidt is a 69 y.o. male who was seen in consultation at the request of Cletis Athens, MD for evaluation of polycytosis/erythrocytosis Patient had lab work done with primary care provider and was found to have high hemoglobin.  Lab results are not available to me.  Patient was referred to heme-onc for further evaluation and discussion. Associated signs or symptoms: Denies weight loss, fever, chills, fatigue, night sweats.   Context:  Smoking history: 76.5 pack year smoking history, he reports that he quitted in the past and then smoke again Testosterone supplements: Denies History of blood clots: Denies Daytime somnolence: Denies Family history of polycythemia: Denies Denies any personal history of thrombosis, stroke, heart attack.  Denies any skin itchiness.  He is a retired Clinical biochemist, remains active.  Mainly works outdoors.  INTERVAL HISTORY Joshua Schmidt is a 69 y.o. male who has above history reviewed by me today presents for follow up visit for management of erythrocytosis Problems and complaints are listed below: Patient has been on therapeutic phlebotomy intermittently for secondary erythrocytosis.  He continues to smoke cigarettes daily. No new complaints.   Review of Systems  Constitutional: Negative for appetite change, chills, diaphoresis, fatigue, fever and unexpected weight change.  HENT:   Negative for hearing loss, lump/mass, nosebleeds, sore throat and voice change.   Eyes: Negative for  eye problems and icterus.  Respiratory: Negative for chest tightness, cough, hemoptysis, shortness of breath and wheezing.   Cardiovascular: Negative for chest pain and leg swelling.  Gastrointestinal: Negative for abdominal distention, abdominal pain, blood in stool, diarrhea, nausea and rectal pain.  Endocrine: Negative for hot flashes.  Genitourinary: Negative for bladder incontinence, difficulty urinating, dysuria, frequency, hematuria and nocturia.   Musculoskeletal: Negative for arthralgias, back pain, flank pain, gait problem and myalgias.  Skin: Negative for itching and rash.  Neurological: Negative for dizziness, gait problem, headaches, light-headedness, numbness and seizures.  Hematological: Negative for adenopathy. Does not bruise/bleed easily.  Psychiatric/Behavioral: Negative for confusion and decreased concentration. The patient is not nervous/anxious.     MEDICAL HISTORY:  Past Medical History:  Diagnosis Date  . Erythrocytosis 12/01/2018  . Hypertension   . Myocardial infarction [facility])     SURGICAL HISTORY: Past Surgical History:  Procedure Laterality Date  . APPENDECTOMY    . COLONOSCOPY WITH PROPOFOL N/A 11/20/2014   Procedure: COLONOSCOPY WITH PROPOFOL;  Surgeon: Christene Lye, MD;  Location: [facility] ENDOSCOPY;  Service: Endoscopy;  Laterality: N/A;  . TONSILLECTOMY AND ADENOIDECTOMY      SOCIAL HISTORY: Social History   Socioeconomic History  . Marital status: Married    Spouse name: Not on file  . Number of children: Not on file  . Years of education: Not on file  . Highest education level: Not on file  Occupational History  . Not on file  Tobacco Use  . Smoking status: Current Every Day Smoker    Packs/day: 1.50    Years: 51.00    Pack years: 76.50    Types: Cigarettes  . Smokeless tobacco: Never Used  . Tobacco comment: 1ppd currently  Substance and Sexual Activity  .  Alcohol use: No    Alcohol/week: 0.0 standard drinks  . Drug use: No  .  Sexual activity: Not on file  Other Topics Concern  . Not on file  Social History Narrative  . Not on file   Social Determinants of Health   Financial Resource Strain:   . Difficulty of Paying Living Expenses: Not on file  Food Insecurity:   . Worried About Charity fundraiser in the Last Year: Not on file  . Ran Out of Food in the Last Year: Not on file  Transportation Needs:   . Lack of Transportation (Medical): Not on file  . Lack of Transportation (Non-Medical): Not on file  Physical Activity:   . Days of Exercise per Week: Not on file  . Minutes of Exercise per Session: Not on file  Stress:   . Feeling of Stress : Not on file  Social Connections:   . Frequency of Communication with Friends and Family: Not on file  . Frequency of Social Gatherings with Friends and Family: Not on file  . Attends Religious Services: Not on file  . Active Member of Clubs or Organizations: Not on file  . Attends Archivist Meetings: Not on file  . Marital Status: Not on file  Intimate Partner Violence:   . Fear of Current or Ex-Partner: Not on file  . Emotionally Abused: Not on file  . Physically Abused: Not on file  . Sexually Abused: Not on file    FAMILY HISTORY: History reviewed. No pertinent family history.  ALLERGIES:  has No Known Allergies.  MEDICATIONS:  Current Outpatient Medications  Medication Sig Dispense Refill  . atorvastatin (LIPITOR) 20 MG tablet Take 1 tablet (20 mg total) by mouth daily. 90 tablet 3  . hydrochlorothiazide (HYDRODIURIL) 25 MG tablet Take 1 tablet (25 mg total) by mouth daily. 90 tablet 3  . lisinopril (ZESTRIL) 20 MG tablet Take 1 tablet (20 mg total) by mouth daily. 90 tablet 3  . metoprolol tartrate (LOPRESSOR) 50 MG tablet Take 1 tablet (50 mg total) by mouth daily. 90 tablet 3  . ofloxacin (OCUFLOX) 0.3 % ophthalmic solution     . prednisoLONE acetate (PRED FORTE) 1 % ophthalmic suspension      No current facility-administered  medications for this visit.     PHYSICAL EXAMINATION: ECOG PERFORMANCE STATUS: 0 - Asymptomatic Vitals:   02/05/20 1328  BP: (!) 154/90  Pulse: 66  Resp: 18  Temp: 98.2 F (36.8 C)   Filed Weights   02/05/20 1328  Weight: 206 lb 1.6 oz (93.5 kg)    Physical Exam Constitutional:      General: He is not in acute distress. HENT:     Head: Normocephalic and atraumatic.  Eyes:     General: No scleral icterus.    Pupils: Pupils are equal, round, and reactive to light.  Cardiovascular:     Rate and Rhythm: Normal rate and regular rhythm.     Heart sounds: Normal heart sounds.  Pulmonary:     Effort: Pulmonary effort is normal. No respiratory distress.     Breath sounds: No wheezing.  Abdominal:     General: Bowel sounds are normal. There is no distension.     Palpations: Abdomen is soft. There is no mass.     Tenderness: There is no abdominal tenderness.  Musculoskeletal:        General: No deformity. Normal range of motion.     Cervical back: Normal range of motion  and neck supple.  Skin:    General: Skin is warm and dry.     Findings: No erythema or rash.  Neurological:     Mental Status: He is alert and oriented to person, place, and time. Mental status is at baseline.     Cranial Nerves: No cranial nerve deficit.     Coordination: Coordination normal.  Psychiatric:        Mood and Affect: Mood normal.        Behavior: Behavior normal.        Thought Content: Thought content normal.     RADIOGRAPHIC STUDIES: I have personally reviewed the radiological images as listed and agreed with the findings in the report. No results found.   LABORATORY DATA:  I have reviewed the data as listed Lab Results  Component Value Date   WBC 8.6 02/05/2020   HGB 17.2 (H) 02/05/2020   HCT 49.2 02/05/2020   MCV 96.3 02/05/2020   PLT 218 02/05/2020   No results for input(s): NA, K, CL, CO2, GLUCOSE, BUN, CREATININE, CALCIUM, GFRNONAA, GFRAA, PROT, ALBUMIN, AST, ALT, ALKPHOS,  BILITOT, BILIDIR, IBILI in the last 8760 hours. Iron/TIBC/Ferritin/ %Sat No results found for: IRON, TIBC, FERRITIN, IRONPCTSAT      ASSESSMENT & PLAN:  1. Erythrocytosis   2. Personal history of tobacco use, presenting hazards to health    #Secondary erythrocytosis, Labs are reviewed and discussed with patient. Hematocrit 49.2, hold off phlebotomy  # Tobacco, I encourage his effort in smoke cessatio,  # lung cancer screening is up to date.  Follow up in 3 months.   Orders Placed This Encounter  Procedures  . CBC with Differential/Platelet    Standing Status:   Future    Standing Expiration Date:   02/04/2021    We spent sufficient time to discuss many aspect of care, questions were answered to patient's satisfaction. The patient knows to call the clinic with any problems questions or concerns.  Return of visit: 3  Months.  Earlie Server, MD, PhD 02/05/2020

## 2020-02-05 NOTE — Progress Notes (Signed)
Pt here for follow up. No new concerns voiced.   

## 2020-05-07 ENCOUNTER — Other Ambulatory Visit: Payer: Self-pay

## 2020-05-07 DIAGNOSIS — D751 Secondary polycythemia: Secondary | ICD-10-CM

## 2020-05-08 ENCOUNTER — Telehealth: Payer: Self-pay | Admitting: Oncology

## 2020-05-08 ENCOUNTER — Inpatient Hospital Stay: Payer: Medicare Other | Admitting: Oncology

## 2020-05-08 ENCOUNTER — Inpatient Hospital Stay: Payer: Medicare Other

## 2020-05-08 NOTE — Telephone Encounter (Signed)
FYI...  Pt left a message stating that he's unable to keep his sched  05/08/20 appts due to frozen pipes in his home.  I tried calling pt back x2. A detailed message was left on his VM to  contact the office to have appts R/S for a date that works best for him.

## 2020-05-08 NOTE — Telephone Encounter (Signed)
Pt unable to make today's appointment due to frozen pipes in his home, pt requests call back to r/s.

## 2020-05-10 ENCOUNTER — Inpatient Hospital Stay (HOSPITAL_BASED_OUTPATIENT_CLINIC_OR_DEPARTMENT_OTHER): Payer: Medicare Other | Admitting: Oncology

## 2020-05-10 ENCOUNTER — Inpatient Hospital Stay: Payer: Medicare Other

## 2020-05-10 ENCOUNTER — Encounter: Payer: Self-pay | Admitting: Oncology

## 2020-05-10 ENCOUNTER — Inpatient Hospital Stay: Payer: Medicare Other | Attending: Oncology

## 2020-05-10 VITALS — BP 152/87 | HR 63 | Temp 99.5°F | Wt 207.4 lb

## 2020-05-10 DIAGNOSIS — Z79899 Other long term (current) drug therapy: Secondary | ICD-10-CM | POA: Insufficient documentation

## 2020-05-10 DIAGNOSIS — F17210 Nicotine dependence, cigarettes, uncomplicated: Secondary | ICD-10-CM | POA: Diagnosis not present

## 2020-05-10 DIAGNOSIS — G4730 Sleep apnea, unspecified: Secondary | ICD-10-CM | POA: Diagnosis not present

## 2020-05-10 DIAGNOSIS — I252 Old myocardial infarction: Secondary | ICD-10-CM | POA: Insufficient documentation

## 2020-05-10 DIAGNOSIS — I10 Essential (primary) hypertension: Secondary | ICD-10-CM | POA: Insufficient documentation

## 2020-05-10 DIAGNOSIS — D751 Secondary polycythemia: Secondary | ICD-10-CM | POA: Diagnosis not present

## 2020-05-10 DIAGNOSIS — Z87891 Personal history of nicotine dependence: Secondary | ICD-10-CM | POA: Diagnosis not present

## 2020-05-10 DIAGNOSIS — Z9049 Acquired absence of other specified parts of digestive tract: Secondary | ICD-10-CM | POA: Insufficient documentation

## 2020-05-10 LAB — CBC WITH DIFFERENTIAL/PLATELET
Abs Immature Granulocytes: 0.09 10*3/uL — ABNORMAL HIGH (ref 0.00–0.07)
Basophils Absolute: 0.1 10*3/uL (ref 0.0–0.1)
Basophils Relative: 1 %
Eosinophils Absolute: 0.2 10*3/uL (ref 0.0–0.5)
Eosinophils Relative: 2 %
HCT: 52.7 % — ABNORMAL HIGH (ref 39.0–52.0)
Hemoglobin: 17.9 g/dL — ABNORMAL HIGH (ref 13.0–17.0)
Immature Granulocytes: 1 %
Lymphocytes Relative: 15 %
Lymphs Abs: 1.1 10*3/uL (ref 0.7–4.0)
MCH: 33.8 pg (ref 26.0–34.0)
MCHC: 34 g/dL (ref 30.0–36.0)
MCV: 99.4 fL (ref 80.0–100.0)
Monocytes Absolute: 0.8 10*3/uL (ref 0.1–1.0)
Monocytes Relative: 10 %
Neutro Abs: 5.1 10*3/uL (ref 1.7–7.7)
Neutrophils Relative %: 71 %
Platelets: 204 10*3/uL (ref 150–400)
RBC: 5.3 MIL/uL (ref 4.22–5.81)
RDW: 13.2 % (ref 11.5–15.5)
WBC: 7.3 10*3/uL (ref 4.0–10.5)
nRBC: 0 % (ref 0.0–0.2)

## 2020-05-10 NOTE — Patient Instructions (Signed)

## 2020-05-10 NOTE — Progress Notes (Signed)
500 ml phlebotomy collected without difficulty. Pt denies any complaints at discharge. VSS.

## 2020-05-10 NOTE — Progress Notes (Signed)
Pt here for follow up. No new concerns voiced.   

## 2020-05-10 NOTE — Progress Notes (Signed)
Hematology/Oncology  Follow up note [facility] Telephone:(336) [phone] Fax:(336) [phone]   Patient Care Team: Cletis Athens, MD as PCP - General (Internal Medicine) Lorelee Market, MD (Family Medicine) Christene Lye, MD (General Surgery) Earlie Server, MD as Consulting Physician (Oncology)  REFERRING PROVIDER: Cletis Athens, MD  CHIEF COMPLAINTS/REASON FOR VISIT:  Follow up for erythrocytosis.  HISTORY OF PRESENTING ILLNESS:  Joshua Schmidt is a 70 y.o. male who was seen in consultation at the request of Cletis Athens, MD for evaluation of polycytosis/erythrocytosis Patient had lab work done with primary care provider and was found to have high hemoglobin.  Lab results are not available to me.  Patient was referred to heme-onc for further evaluation and discussion. Associated signs or symptoms: Denies weight loss, fever, chills, fatigue, night sweats.   Context:  Smoking history: 76.5 pack year smoking history, he reports that he quitted in the past and then smoke again Testosterone supplements: Denies History of blood clots: Denies Daytime somnolence: Denies Family history of polycythemia: Denies Denies any personal history of thrombosis, stroke, heart attack.  Denies any skin itchiness.  He is a retired Clinical biochemist, remains active.  Mainly works outdoors.  INTERVAL HISTORY Joshua Schmidt is a 70 y.o. male who has above history reviewed by me today presents for follow up visit for management of erythrocytosis Problems and complaints are listed below: Patient has been on therapeutic phlebotomy intermittently for secondary erythrocytosis.  Patient continues to smoke daily.  Patient also has sleep apnea and not using CPAP machine.  Review of Systems  Constitutional: Negative for appetite change, chills, diaphoresis, fatigue, fever and unexpected weight change.  HENT:   Negative for hearing loss, lump/mass, nosebleeds, sore throat and voice  change.   Eyes: Negative for eye problems and icterus.  Respiratory: Negative for chest tightness, cough, hemoptysis, shortness of breath and wheezing.   Cardiovascular: Negative for chest pain and leg swelling.  Gastrointestinal: Negative for abdominal distention, abdominal pain, blood in stool, diarrhea, nausea and rectal pain.  Endocrine: Negative for hot flashes.  Genitourinary: Negative for bladder incontinence, difficulty urinating, dysuria, frequency, hematuria and nocturia.   Musculoskeletal: Negative for arthralgias, back pain, flank pain, gait problem and myalgias.  Skin: Negative for itching and rash.  Neurological: Negative for dizziness, gait problem, headaches, light-headedness, numbness and seizures.  Hematological: Negative for adenopathy. Does not bruise/bleed easily.  Psychiatric/Behavioral: Negative for confusion and decreased concentration. The patient is not nervous/anxious.     MEDICAL HISTORY:  Past Medical History:  Diagnosis Date  . Erythrocytosis 12/01/2018  . Hypertension   . Myocardial infarction [facility])     SURGICAL HISTORY: Past Surgical History:  Procedure Laterality Date  . APPENDECTOMY    . COLONOSCOPY WITH PROPOFOL N/A 11/20/2014   Procedure: COLONOSCOPY WITH PROPOFOL;  Surgeon: Christene Lye, MD;  Location: [facility] ENDOSCOPY;  Service: Endoscopy;  Laterality: N/A;  . TONSILLECTOMY AND ADENOIDECTOMY      SOCIAL HISTORY: Social History   Socioeconomic History  . Marital status: Married    Spouse name: Not on file  . Number of children: Not on file  . Years of education: Not on file  . Highest education level: Not on file  Occupational History  . Not on file  Tobacco Use  . Smoking status: Current Every Day Smoker    Packs/day: 1.50    Years: 51.00    Pack years: 76.50    Types: Cigarettes  . Smokeless tobacco: Never Used  . Tobacco comment: 1ppd  currently  Substance and Sexual Activity  . Alcohol use: No    Alcohol/week: 0.0  standard drinks  . Drug use: No  . Sexual activity: Not on file  Other Topics Concern  . Not on file  Social History Narrative  . Not on file   Social Determinants of Health   Financial Resource Strain: Not on file  Food Insecurity: Not on file  Transportation Needs: Not on file  Physical Activity: Not on file  Stress: Not on file  Social Connections: Not on file  Intimate Partner Violence: Not on file    FAMILY HISTORY: History reviewed. No pertinent family history.  ALLERGIES:  has No Known Allergies.  MEDICATIONS:  Current Outpatient Medications  Medication Sig Dispense Refill  . atorvastatin (LIPITOR) 20 MG tablet Take 1 tablet (20 mg total) by mouth daily. 90 tablet 3  . hydrochlorothiazide (HYDRODIURIL) 25 MG tablet Take 1 tablet (25 mg total) by mouth daily. 90 tablet 3  . lisinopril (ZESTRIL) 20 MG tablet Take 1 tablet (20 mg total) by mouth daily. 90 tablet 3  . metoprolol tartrate (LOPRESSOR) 50 MG tablet Take 1 tablet (50 mg total) by mouth daily. 90 tablet 3  . ofloxacin (OCUFLOX) 0.3 % ophthalmic solution  (Patient not taking: Reported on 05/10/2020)    . prednisoLONE acetate (PRED FORTE) 1 % ophthalmic suspension  (Patient not taking: Reported on 05/10/2020)     No current facility-administered medications for this visit.     PHYSICAL EXAMINATION: ECOG PERFORMANCE STATUS: 0 - Asymptomatic Vitals:   05/10/20 1407  BP: (!) 152/87  Pulse: 63  Temp: 99.5 F (37.5 C)  SpO2: 93%   Filed Weights   05/10/20 1407  Weight: 207 lb 6 oz (94.1 kg)    Physical Exam Constitutional:      General: He is not in acute distress. HENT:     Head: Normocephalic and atraumatic.  Eyes:     General: No scleral icterus.    Pupils: Pupils are equal, round, and reactive to light.  Cardiovascular:     Rate and Rhythm: Normal rate and regular rhythm.     Heart sounds: Normal heart sounds.  Pulmonary:     Effort: Pulmonary effort is normal. No respiratory distress.      Breath sounds: No wheezing.  Abdominal:     General: Bowel sounds are normal. There is no distension.     Palpations: Abdomen is soft. There is no mass.     Tenderness: There is no abdominal tenderness.  Musculoskeletal:        General: No deformity. Normal range of motion.     Cervical back: Normal range of motion and neck supple.  Skin:    General: Skin is warm and dry.     Findings: No erythema or rash.  Neurological:     Mental Status: He is alert and oriented to person, place, and time. Mental status is at baseline.     Cranial Nerves: No cranial nerve deficit.     Coordination: Coordination normal.  Psychiatric:        Mood and Affect: Mood normal.        Behavior: Behavior normal.        Thought Content: Thought content normal.     RADIOGRAPHIC STUDIES: I have personally reviewed the radiological images as listed and agreed with the findings in the report. No results found.   LABORATORY DATA:  I have reviewed the data as listed Lab Results  Component Value Date  WBC 7.3 05/10/2020   HGB 17.9 (H) 05/10/2020   HCT 52.7 (H) 05/10/2020   MCV 99.4 05/10/2020   PLT 204 05/10/2020   No results for input(s): NA, K, CL, CO2, GLUCOSE, BUN, CREATININE, CALCIUM, GFRNONAA, GFRAA, PROT, ALBUMIN, AST, ALT, ALKPHOS, BILITOT, BILIDIR, IBILI in the last 8760 hours. Iron/TIBC/Ferritin/ %Sat No results found for: IRON, TIBC, FERRITIN, IRONPCTSAT      ASSESSMENT & PLAN:  1. Secondary erythrocytosis   2. Personal history of tobacco use, presenting hazards to health    #Secondary erythrocytosis, Labs are reviewed and discussed with patient. Hemoglobin is 17.9, hematocrit 52.7. I recommend phlebotomy to keep hematocrit less than 50.  Discussed with patient that secondary erythrocytosis likely due to smoking and underlying sleep apnea. Smoke cessation was discussed with patient and I encouraged his efforts. Sleep apnea not using CPAP machine.  Recommend patient to further discuss  with primary care provider.  # lung cancer screening is up to date.  Follow up in 3 months.  H&H monthly +/- phlebotomy.   Orders Placed This Encounter  Procedures  . Hemoglobin and Hematocrit, Blood    Standing Status:   Standing    Number of Occurrences:   20    Standing Expiration Date:   05/10/2021    We spent sufficient time to discuss many aspect of care, questions were answered to patient's satisfaction. The patient knows to call the clinic with any problems questions or concerns.  Return of visit: 3  Months.  Earlie Server, MD, PhD 05/10/2020

## 2020-06-10 ENCOUNTER — Inpatient Hospital Stay: Payer: Medicare Other

## 2020-06-10 ENCOUNTER — Inpatient Hospital Stay: Payer: Medicare Other | Attending: Oncology

## 2020-06-10 VITALS — BP 128/85 | HR 63 | Temp 97.5°F | Resp 18

## 2020-06-10 DIAGNOSIS — D751 Secondary polycythemia: Secondary | ICD-10-CM

## 2020-06-10 DIAGNOSIS — Z79899 Other long term (current) drug therapy: Secondary | ICD-10-CM | POA: Diagnosis not present

## 2020-06-10 DIAGNOSIS — I252 Old myocardial infarction: Secondary | ICD-10-CM | POA: Insufficient documentation

## 2020-06-10 DIAGNOSIS — F17210 Nicotine dependence, cigarettes, uncomplicated: Secondary | ICD-10-CM | POA: Diagnosis not present

## 2020-06-10 DIAGNOSIS — I10 Essential (primary) hypertension: Secondary | ICD-10-CM | POA: Diagnosis not present

## 2020-06-10 LAB — HEMOGLOBIN AND HEMATOCRIT, BLOOD
HCT: 53.5 % — ABNORMAL HIGH (ref 39.0–52.0)
Hemoglobin: 18.1 g/dL — ABNORMAL HIGH (ref 13.0–17.0)

## 2020-06-10 NOTE — Progress Notes (Signed)
HCT 53.5 today. 500 ml phlebotomy performed. Pt tolerated procedure well. No complaints at time of discharge. VSS.

## 2020-07-08 ENCOUNTER — Inpatient Hospital Stay: Payer: Medicare Other | Attending: Oncology

## 2020-07-08 ENCOUNTER — Inpatient Hospital Stay: Payer: Medicare Other

## 2020-07-08 ENCOUNTER — Other Ambulatory Visit: Payer: Self-pay

## 2020-07-08 VITALS — BP 145/92 | HR 64 | Temp 98.5°F | Resp 18

## 2020-07-08 DIAGNOSIS — Z87891 Personal history of nicotine dependence: Secondary | ICD-10-CM | POA: Insufficient documentation

## 2020-07-08 DIAGNOSIS — D751 Secondary polycythemia: Secondary | ICD-10-CM | POA: Diagnosis not present

## 2020-07-08 LAB — HEMOGLOBIN AND HEMATOCRIT, BLOOD
HCT: 51.7 % (ref 39.0–52.0)
Hemoglobin: 17.5 g/dL — ABNORMAL HIGH (ref 13.0–17.0)

## 2020-07-08 NOTE — Progress Notes (Signed)
500 ml phlebotomy performed. Tolerated well. VSS. Discharge to home.

## 2020-08-06 ENCOUNTER — Other Ambulatory Visit: Payer: Self-pay | Admitting: Internal Medicine

## 2020-08-06 ENCOUNTER — Other Ambulatory Visit: Payer: Self-pay

## 2020-08-06 DIAGNOSIS — D751 Secondary polycythemia: Secondary | ICD-10-CM

## 2020-08-07 ENCOUNTER — Encounter: Payer: Self-pay | Admitting: Oncology

## 2020-08-07 ENCOUNTER — Inpatient Hospital Stay (HOSPITAL_BASED_OUTPATIENT_CLINIC_OR_DEPARTMENT_OTHER): Payer: Medicare Other | Admitting: Oncology

## 2020-08-07 ENCOUNTER — Other Ambulatory Visit: Payer: Self-pay

## 2020-08-07 ENCOUNTER — Inpatient Hospital Stay: Payer: Medicare Other | Attending: Oncology

## 2020-08-07 ENCOUNTER — Inpatient Hospital Stay: Payer: Medicare Other

## 2020-08-07 VITALS — BP 150/96 | HR 55 | Temp 97.9°F | Resp 18 | Wt 204.1 lb

## 2020-08-07 DIAGNOSIS — D751 Secondary polycythemia: Secondary | ICD-10-CM | POA: Insufficient documentation

## 2020-08-07 DIAGNOSIS — F17210 Nicotine dependence, cigarettes, uncomplicated: Secondary | ICD-10-CM | POA: Diagnosis not present

## 2020-08-07 DIAGNOSIS — Z87891 Personal history of nicotine dependence: Secondary | ICD-10-CM | POA: Diagnosis not present

## 2020-08-07 LAB — CBC WITH DIFFERENTIAL/PLATELET
Abs Immature Granulocytes: 0.03 10*3/uL (ref 0.00–0.07)
Basophils Absolute: 0 10*3/uL (ref 0.0–0.1)
Basophils Relative: 0 %
Eosinophils Absolute: 0.1 10*3/uL (ref 0.0–0.5)
Eosinophils Relative: 1 %
HCT: 48.7 % (ref 39.0–52.0)
Hemoglobin: 16.4 g/dL (ref 13.0–17.0)
Immature Granulocytes: 0 %
Lymphocytes Relative: 9 %
Lymphs Abs: 0.8 10*3/uL (ref 0.7–4.0)
MCH: 32.5 pg (ref 26.0–34.0)
MCHC: 33.7 g/dL (ref 30.0–36.0)
MCV: 96.4 fL (ref 80.0–100.0)
Monocytes Absolute: 0.9 10*3/uL (ref 0.1–1.0)
Monocytes Relative: 10 %
Neutro Abs: 7.1 10*3/uL (ref 1.7–7.7)
Neutrophils Relative %: 80 %
Platelets: 204 10*3/uL (ref 150–400)
RBC: 5.05 MIL/uL (ref 4.22–5.81)
RDW: 15.1 % (ref 11.5–15.5)
WBC: 9 10*3/uL (ref 4.0–10.5)
nRBC: 0 % (ref 0.0–0.2)

## 2020-08-07 LAB — COMPREHENSIVE METABOLIC PANEL
ALT: 16 U/L (ref 0–44)
AST: 18 U/L (ref 15–41)
Albumin: 3.7 g/dL (ref 3.5–5.0)
Alkaline Phosphatase: 54 U/L (ref 38–126)
Anion gap: 9 (ref 5–15)
BUN: 14 mg/dL (ref 8–23)
CO2: 29 mmol/L (ref 22–32)
Calcium: 8.7 mg/dL — ABNORMAL LOW (ref 8.9–10.3)
Chloride: 95 mmol/L — ABNORMAL LOW (ref 98–111)
Creatinine, Ser: 0.74 mg/dL (ref 0.61–1.24)
GFR, Estimated: >60 mL/min (ref >60)
Glucose, Bld: 117 mg/dL — ABNORMAL HIGH (ref 70–99)
Potassium: 4.2 mmol/L (ref 3.5–5.1)
Sodium: 133 mmol/L — ABNORMAL LOW (ref 135–145)
Total Bilirubin: 0.8 mg/dL (ref 0.3–1.2)
Total Protein: 7.1 g/dL (ref 6.5–8.1)

## 2020-08-07 NOTE — Progress Notes (Signed)
No phlebotomy procedure today, per MD.

## 2020-08-07 NOTE — Progress Notes (Signed)
Pt here for follow up. No new concerns voiced.   

## 2020-08-07 NOTE — Progress Notes (Signed)
Hematology/Oncology  Follow up note [facility] Telephone:(336) [phone] Fax:(336) [phone]   Patient Care Team: Cletis Athens, MD as PCP - General (Internal Medicine) Lorelee Market, MD (Family Medicine) Christene Lye, MD (General Surgery) Earlie Server, MD as Consulting Physician (Oncology)  REFERRING PROVIDER: Cletis Athens, MD  CHIEF COMPLAINTS/REASON FOR VISIT:  Follow up for erythrocytosis.  HISTORY OF PRESENTING ILLNESS:  Joshua Schmidt is a 70 y.o. male who was seen in consultation at the request of Cletis Athens, MD for evaluation of polycytosis/erythrocytosis Patient had lab work done with primary care provider and was found to have high hemoglobin.  Lab results are not available to me.  Patient was referred to heme-onc for further evaluation and discussion. Associated signs or symptoms: Denies weight loss, fever, chills, fatigue, night sweats.   Context:  Smoking history: 76.5 pack year smoking history, he reports that he quitted in the past and then smoke again Testosterone supplements: Denies History of blood clots: Denies Daytime somnolence: Denies Family history of polycythemia: Denies Denies any personal history of thrombosis, stroke, heart attack.  Denies any skin itchiness.  He is a retired Clinical biochemist, remains active.  Mainly works outdoors. Sleep apnea, not interested in using CPAP machine  INTERVAL HISTORY Joshua Schmidt is a 70 y.o. male who has above history reviewed by me today presents for follow up visit for management of erythrocytosis Problems and complaints are listed below: Patient has been on therapeutic phlebotomy intermittently for secondary erythrocytosis.   Patient continues to smoke, he has decreased cigarette consumption.   Review of Systems  Constitutional: Negative for appetite change, chills, diaphoresis, fatigue, fever and unexpected weight change.  HENT:   Negative for hearing loss, lump/mass,  nosebleeds, sore throat and voice change.   Eyes: Negative for eye problems and icterus.  Respiratory: Negative for chest tightness, cough, hemoptysis, shortness of breath and wheezing.   Cardiovascular: Negative for chest pain and leg swelling.  Gastrointestinal: Negative for abdominal distention, abdominal pain, blood in stool, diarrhea, nausea and rectal pain.  Endocrine: Negative for hot flashes.  Genitourinary: Negative for bladder incontinence, difficulty urinating, dysuria, frequency, hematuria and nocturia.   Musculoskeletal: Negative for arthralgias, back pain, flank pain, gait problem and myalgias.  Skin: Negative for itching and rash.  Neurological: Negative for dizziness, gait problem, headaches, light-headedness, numbness and seizures.  Hematological: Negative for adenopathy. Does not bruise/bleed easily.  Psychiatric/Behavioral: Negative for confusion and decreased concentration. The patient is not nervous/anxious.     MEDICAL HISTORY:  Past Medical History:  Diagnosis Date  . Erythrocytosis 12/01/2018  . Hypertension   . Myocardial infarction [facility])     SURGICAL HISTORY: Past Surgical History:  Procedure Laterality Date  . APPENDECTOMY    . COLONOSCOPY WITH PROPOFOL N/A 11/20/2014   Procedure: COLONOSCOPY WITH PROPOFOL;  Surgeon: Christene Lye, MD;  Location: [facility] ENDOSCOPY;  Service: Endoscopy;  Laterality: N/A;  . TONSILLECTOMY AND ADENOIDECTOMY      SOCIAL HISTORY: Social History   Socioeconomic History  . Marital status: Married    Spouse name: Not on file  . Number of children: Not on file  . Years of education: Not on file  . Highest education level: Not on file  Occupational History  . Not on file  Tobacco Use  . Smoking status: Current Every Day Smoker    Packs/day: 1.50    Years: 51.00    Pack years: 76.50    Types: Cigarettes  . Smokeless tobacco: Never Used  .  Tobacco comment: 1ppd currently  Substance and Sexual Activity  . Alcohol  use: No    Alcohol/week: 0.0 standard drinks  . Drug use: No  . Sexual activity: Not on file  Other Topics Concern  . Not on file  Social History Narrative  . Not on file   Social Determinants of Health   Financial Resource Strain: Not on file  Food Insecurity: Not on file  Transportation Needs: Not on file  Physical Activity: Not on file  Stress: Not on file  Social Connections: Not on file  Intimate Partner Violence: Not on file    FAMILY HISTORY: History reviewed. No pertinent family history.  ALLERGIES:  has No Known Allergies.  MEDICATIONS:  Current Outpatient Medications  Medication Sig Dispense Refill  . atorvastatin (LIPITOR) 20 MG tablet Take 1 tablet (20 mg total) by mouth daily. 90 tablet 3  . hydrochlorothiazide (HYDRODIURIL) 25 MG tablet TAKE 1 TABLET BY MOUTH  DAILY 90 tablet 3  . lisinopril (ZESTRIL) 20 MG tablet Take 1 tablet (20 mg total) by mouth daily. 90 tablet 3  . metoprolol tartrate (LOPRESSOR) 50 MG tablet Take 1 tablet (50 mg total) by mouth daily. 90 tablet 3   No current facility-administered medications for this visit.     PHYSICAL EXAMINATION: ECOG PERFORMANCE STATUS: 0 - Asymptomatic Vitals:   08/07/20 1351  BP: (!) 150/96  Pulse: (!) 55  Resp: 18  Temp: 97.9 F (36.6 C)   Filed Weights   08/07/20 1351  Weight: 204 lb 1.6 oz (92.6 kg)    Physical Exam Constitutional:      General: He is not in acute distress. HENT:     Head: Normocephalic and atraumatic.  Eyes:     General: No scleral icterus.    Pupils: Pupils are equal, round, and reactive to light.  Cardiovascular:     Rate and Rhythm: Normal rate and regular rhythm.     Heart sounds: Normal heart sounds.  Pulmonary:     Effort: Pulmonary effort is normal. No respiratory distress.     Breath sounds: No wheezing.  Abdominal:     General: Bowel sounds are normal. There is no distension.     Palpations: Abdomen is soft. There is no mass.     Tenderness: There is no  abdominal tenderness.  Musculoskeletal:        General: No deformity. Normal range of motion.     Cervical back: Normal range of motion and neck supple.  Skin:    General: Skin is warm and dry.     Findings: No erythema or rash.  Neurological:     Mental Status: He is alert and oriented to person, place, and time. Mental status is at baseline.     Cranial Nerves: No cranial nerve deficit.     Coordination: Coordination normal.  Psychiatric:        Mood and Affect: Mood normal.        Behavior: Behavior normal.        Thought Content: Thought content normal.     RADIOGRAPHIC STUDIES: I have personally reviewed the radiological images as listed and agreed with the findings in the report. No results found.   LABORATORY DATA:  I have reviewed the data as listed Lab Results  Component Value Date   WBC 9.0 08/07/2020   HGB 16.4 08/07/2020   HCT 48.7 08/07/2020   MCV 96.4 08/07/2020   PLT 204 08/07/2020   Recent Labs    08/07/20 1328  NA 133*  K 4.2  CL 95*  CO2 29  GLUCOSE 117*  BUN 14  CREATININE 0.74  CALCIUM 8.7*  GFRNONAA >60  PROT 7.1  ALBUMIN 3.7  AST 18  ALT 16  ALKPHOS 54  BILITOT 0.8   Iron/TIBC/Ferritin/ %Sat No results found for: IRON, TIBC, FERRITIN, IRONPCTSAT      ASSESSMENT & PLAN:  1. Erythrocytosis   2. Personal history of tobacco use, presenting hazards to health    #Secondary erythrocytosis, Labs reviewed and discussed with patient. Hemoglobin and hematocrit have improved. I will hold off phlebotomy today   Smoke cessation was discussed with him. Sleep apnea, he is not interested in using CPAP machine.  # lung cancer screening is up to date.  Follow up in   H&H every 6 weeks +/- phlebotomy.  Follow-up lab MD 18 weeks +/- phlebotomy   Orders Placed This Encounter  Procedures  . Hemoglobin and hematocrit, blood    Standing Status:   Standing    Number of Occurrences:   2    Standing Expiration Date:   08/07/2021  . CBC with  Differential/Platelet    Standing Status:   Future    Standing Expiration Date:   08/07/2021    We spent sufficient time to discuss many aspect of care, questions were answered to patient's satisfaction. The patient knows to call the clinic with any problems questions or concerns.  Return of visit: 18 weeks   Earlie Server, MD, PhD 08/07/2020

## 2020-08-30 ENCOUNTER — Ambulatory Visit (INDEPENDENT_AMBULATORY_CARE_PROVIDER_SITE_OTHER): Payer: Medicare Other | Admitting: Family Medicine

## 2020-08-30 ENCOUNTER — Other Ambulatory Visit: Payer: Self-pay

## 2020-08-30 ENCOUNTER — Encounter: Payer: Self-pay | Admitting: Family Medicine

## 2020-08-30 VITALS — BP 155/88 | HR 67 | Ht 75.0 in | Wt 200.8 lb

## 2020-08-30 DIAGNOSIS — R0989 Other specified symptoms and signs involving the circulatory and respiratory systems: Secondary | ICD-10-CM | POA: Insufficient documentation

## 2020-08-30 DIAGNOSIS — D751 Secondary polycythemia: Secondary | ICD-10-CM

## 2020-08-30 DIAGNOSIS — Z720 Tobacco use: Secondary | ICD-10-CM | POA: Diagnosis not present

## 2020-08-30 NOTE — Assessment & Plan Note (Signed)
Down to 18 cigs per day, no sob, had CT chest Last July.

## 2020-08-30 NOTE — Progress Notes (Signed)
Established Patient Office Visit  SUBJECTIVE:  Subjective  Patient ID: Joshua Schmidt, male    DOB: 09/01/1950  Age: 70 y.o. MRN: [id number]  CC:  Chief Complaint  Patient presents with  . Follow-up    Concerns of blockage in carotid artery     HPI Joshua Schmidt is a 70 y.o. male presenting today for     Past Medical History:  Diagnosis Date  . Erythrocytosis 12/01/2018  . Hypertension   . Myocardial infarction [facility])     Past Surgical History:  Procedure Laterality Date  . APPENDECTOMY    . COLONOSCOPY WITH PROPOFOL N/A 11/20/2014   Procedure: COLONOSCOPY WITH PROPOFOL;  Surgeon: Christene Lye, MD;  Location: [facility] ENDOSCOPY;  Service: Endoscopy;  Laterality: N/A;  . TONSILLECTOMY AND ADENOIDECTOMY      History reviewed. No pertinent family history.  Social History   Socioeconomic History  . Marital status: Married    Spouse name: Not on file  . Number of children: Not on file  . Years of education: Not on file  . Highest education level: Not on file  Occupational History  . Not on file  Tobacco Use  . Smoking status: Current Every Day Smoker    Packs/day: 1.50    Years: 51.00    Pack years: 76.50    Types: Cigarettes  . Smokeless tobacco: Never Used  . Tobacco comment: 1ppd currently  Substance and Sexual Activity  . Alcohol use: No    Alcohol/week: 0.0 standard drinks  . Drug use: No  . Sexual activity: Not on file  Other Topics Concern  . Not on file  Social History Narrative  . Not on file   Social Determinants of Health   Financial Resource Strain: Not on file  Food Insecurity: Not on file  Transportation Needs: Not on file  Physical Activity: Not on file  Stress: Not on file  Social Connections: Not on file  Intimate Partner Violence: Not on file     Current Outpatient Medications:  .  atorvastatin (LIPITOR) 20 MG tablet, Take 1 tablet (20 mg total) by mouth daily., Disp: 90 tablet, Rfl: 3 .  hydrochlorothiazide (HYDRODIURIL)  25 MG tablet, TAKE 1 TABLET BY MOUTH  DAILY, Disp: 90 tablet, Rfl: 3 .  lisinopril (ZESTRIL) 20 MG tablet, Take 1 tablet (20 mg total) by mouth daily., Disp: 90 tablet, Rfl: 3 .  metoprolol tartrate (LOPRESSOR) 50 MG tablet, Take 1 tablet (50 mg total) by mouth daily., Disp: 90 tablet, Rfl: 3   No Known Allergies  ROS Review of Systems  Constitutional: Negative.   HENT: Negative.   Cardiovascular: Negative.   Gastrointestinal: Negative.   Genitourinary: Negative.   Musculoskeletal: Negative.   Neurological: Negative.      OBJECTIVE:    Physical Exam Vitals and nursing note reviewed.  Constitutional:      Appearance: Normal appearance.  HENT:     Right Ear: Tympanic membrane normal.     Left Ear: Tympanic membrane normal.     Mouth/Throat:     Mouth: Mucous membranes are moist.  Cardiovascular:     Rate and Rhythm: Normal rate and regular rhythm.  Musculoskeletal:        General: Normal range of motion.  Neurological:     General: No focal deficit present.     Mental Status: He is alert.     BP (!) 155/88   Pulse 67   Ht 6\' 3"  (1.905 m)   Wt 200  lb 12.8 oz (91.1 kg)   BMI 25.10 kg/m  Wt Readings from Last 3 Encounters:  08/30/20 200 lb 12.8 oz (91.1 kg)  08/07/20 204 lb 1.6 oz (92.6 kg)  05/10/20 207 lb 6 oz (94.1 kg)    Health Maintenance Due  Topic Date Due  . Hepatitis C Screening  Never done  . TETANUS/TDAP  Never done  . PNA vac Low Risk Adult (1 of 2 - PCV13) Never done  . COVID-19 Vaccine (3 - Booster for Pfizer series) 11/27/2019    There are no preventive care reminders to display for this patient.  CBC Latest Ref Rng & Units 08/07/2020 07/08/2020 06/10/2020  WBC 4.0 - 10.5 K/uL 9.0 - -  Hemoglobin 13.0 - 17.0 g/dL 16.4 17.5(H) 18.1(H)  Hematocrit 39.0 - 52.0 % 48.7 51.7 53.5(H)  Platelets 150 - 400 K/uL 204 - -   CMP Latest Ref Rng & Units 08/07/2020 11/30/2018  Glucose 70 - 99 mg/dL 117(H) 102(H)  BUN 8 - 23 mg/dL 14 8  Creatinine 0.61 - 1.24  mg/dL 0.74 0.71  Sodium 135 - 145 mmol/L 133(L) 138  Potassium 3.5 - 5.1 mmol/L 4.2 3.7  Chloride 98 - 111 mmol/L 95(L) 100  CO2 22 - 32 mmol/L 29 29  Calcium 8.9 - 10.3 mg/dL 8.7(L) 9.1  Total Protein 6.5 - 8.1 g/dL 7.1 8.0  Total Bilirubin 0.3 - 1.2 mg/dL 0.8 0.7  Alkaline Phos 38 - 126 U/L 54 70  AST 15 - 41 U/L 18 21  ALT 0 - 44 U/L 16 22    No results found for: TSH Lab Results  Component Value Date   ALBUMIN 3.7 08/07/2020   ANIONGAP 9 08/07/2020   No results found for: CHOL, HDL, LDLCALC, CHOLHDL No results found for: TRIG No results found for: HGBA1C    ASSESSMENT & PLAN:   Problem List Items Addressed This Visit      Other   Erythrocytosis    Phlebotomy every month, says that he feels great after the procedure. No current sx.      Bruit of left carotid artery - Primary    Was told by cardiology that he had a Carotid Bruit Left side.  Plan- Ordered Doppler.          No orders of the defined types were placed in this encounter.     Follow-up: No follow-ups on file.    Beckie Salts, [place] [address] [place], [place] [address]

## 2020-08-30 NOTE — Assessment & Plan Note (Signed)
Was told by cardiology that he had a Carotid Bruit Left side.  Plan- Ordered Doppler.

## 2020-08-30 NOTE — Assessment & Plan Note (Signed)
Phlebotomy every month, says that he feels great after the procedure. No current sx.

## 2020-09-06 ENCOUNTER — Encounter: Payer: Self-pay | Admitting: Family Medicine

## 2020-09-06 ENCOUNTER — Ambulatory Visit (INDEPENDENT_AMBULATORY_CARE_PROVIDER_SITE_OTHER): Payer: Medicare Other | Admitting: Family Medicine

## 2020-09-06 ENCOUNTER — Other Ambulatory Visit: Payer: Self-pay

## 2020-09-06 VITALS — BP 157/96 | HR 60 | Ht 75.0 in | Wt 200.9 lb

## 2020-09-06 DIAGNOSIS — F1010 Alcohol abuse, uncomplicated: Secondary | ICD-10-CM

## 2020-09-06 DIAGNOSIS — R0989 Other specified symptoms and signs involving the circulatory and respiratory systems: Secondary | ICD-10-CM

## 2020-09-06 DIAGNOSIS — Z720 Tobacco use: Secondary | ICD-10-CM

## 2020-09-06 DIAGNOSIS — Z0000 Encounter for general adult medical examination without abnormal findings: Secondary | ICD-10-CM | POA: Diagnosis not present

## 2020-09-06 LAB — POCT URINALYSIS DIPSTICK
Appearance: NORMAL
Bilirubin, UA: NEGATIVE
Blood, UA: NEGATIVE
Glucose, UA: NEGATIVE
Ketones, UA: NEGATIVE
Leukocytes, UA: NEGATIVE
Nitrite, UA: NEGATIVE
Protein, UA: NEGATIVE
Spec Grav, UA: 1.015 (ref 1.010–1.025)
Urobilinogen, UA: NEGATIVE E.U./dL — AB
pH, UA: 6 (ref 5.0–8.0)

## 2020-09-06 LAB — PSA: PSA: 0.54 ng/mL (ref <=4.00)

## 2020-09-06 NOTE — Assessment & Plan Note (Addendum)
Colon Screening- 2018 Normal PSA- Draw today TDAP- within last 5 years Shingles Vaccine- 2021 Flu Vaccine- 2021 Pneumonia Vaccine- Unsure will check with pharmacy  Healthy eating habits discussed,  Denies urinary hesitancy  Performing UA today to screen for hematuria with his hx of smoking.

## 2020-09-06 NOTE — Progress Notes (Signed)
Established Patient Office Visit  SUBJECTIVE:  Subjective  Patient ID: Joshua Schmidt, male    DOB: [birth year]  Age: 70 y.o. MRN: [id number]  CC:  Chief Complaint  Patient presents with  . Annual Exam    HPI Joshua Schmidt is a 70 y.o. male presenting today for     Past Medical History:  Diagnosis Date  . Erythrocytosis 12/01/2018  . Hypertension   . Myocardial infarction [facility])     Past Surgical History:  Procedure Laterality Date  . APPENDECTOMY    . COLONOSCOPY WITH PROPOFOL N/A 11/20/2014   Procedure: COLONOSCOPY WITH PROPOFOL;  Surgeon: Christene Lye, MD;  Location: [facility] ENDOSCOPY;  Service: Endoscopy;  Laterality: N/A;  . TONSILLECTOMY AND ADENOIDECTOMY      History reviewed. No pertinent family history.  Social History   Socioeconomic History  . Marital status: Married    Spouse name: Not on file  . Number of children: Not on file  . Years of education: Not on file  . Highest education level: Not on file  Occupational History  . Not on file  Tobacco Use  . Smoking status: Current Every Day Smoker    Packs/day: 1.50    Years: 51.00    Pack years: 76.50    Types: Cigarettes  . Smokeless tobacco: Never Used  . Tobacco comment: 1ppd currently  Substance and Sexual Activity  . Alcohol use: No    Alcohol/week: 0.0 standard drinks  . Drug use: No  . Sexual activity: Not on file  Other Topics Concern  . Not on file  Social History Narrative  . Not on file   Social Determinants of Health   Financial Resource Strain: Not on file  Food Insecurity: Not on file  Transportation Needs: Not on file  Physical Activity: Not on file  Stress: Not on file  Social Connections: Not on file  Intimate Partner Violence: Not on file     Current Outpatient Medications:  .  atorvastatin (LIPITOR) 20 MG tablet, Take 1 tablet (20 mg total) by mouth daily., Disp: 90 tablet, Rfl: 3 .  hydrochlorothiazide (HYDRODIURIL) 25 MG tablet, TAKE 1 TABLET BY MOUTH   DAILY, Disp: 90 tablet, Rfl: 3 .  lisinopril (ZESTRIL) 20 MG tablet, Take 1 tablet (20 mg total) by mouth daily., Disp: 90 tablet, Rfl: 3 .  metoprolol tartrate (LOPRESSOR) 50 MG tablet, Take 1 tablet (50 mg total) by mouth daily., Disp: 90 tablet, Rfl: 3   No Known Allergies  ROS Review of Systems  Constitutional: Negative.   HENT: Negative.   Respiratory: Negative.   Cardiovascular: Negative.   Gastrointestinal: Negative.   Genitourinary: Negative.   Musculoskeletal: Negative.      OBJECTIVE:    Physical Exam Constitutional:      Appearance: Normal appearance.  HENT:     Right Ear: Tympanic membrane normal.     Left Ear: Tympanic membrane normal.     Mouth/Throat:     Mouth: Mucous membranes are moist.  Cardiovascular:     Rate and Rhythm: Normal rate and regular rhythm.  Skin:    General: Skin is warm.  Neurological:     General: No focal deficit present.     Mental Status: He is alert.  Psychiatric:        Mood and Affect: Mood normal.     BP (!) 157/96   Pulse 60   Ht 6\' 3"  (1.905 m)   Wt 200 lb 14.4 oz (91.1 kg)  BMI 25.11 kg/m  Wt Readings from Last 3 Encounters:  09/06/20 200 lb 14.4 oz (91.1 kg)  08/30/20 200 lb 12.8 oz (91.1 kg)  08/07/20 204 lb 1.6 oz (92.6 kg)    Health Maintenance Due  Topic Date Due  . Hepatitis C Screening  Never done  . TETANUS/TDAP  Never done  . Zoster Vaccines- Shingrix (1 of 2) Never done  . PNA vac Low Risk Adult (1 of 2 - PCV13) Never done  . COVID-19 Vaccine (3 - Booster for Pfizer series) 11/27/2019    There are no preventive care reminders to display for this patient.  CBC Latest Ref Rng & Units 08/07/2020 07/08/2020 06/10/2020  WBC 4.0 - 10.5 K/uL 9.0 - -  Hemoglobin 13.0 - 17.0 g/dL 16.4 17.5(H) 18.1(H)  Hematocrit 39.0 - 52.0 % 48.7 51.7 53.5(H)  Platelets 150 - 400 K/uL 204 - -   CMP Latest Ref Rng & Units 08/07/2020 11/30/2018  Glucose 70 - 99 mg/dL 117(H) 102(H)  BUN 8 - 23 mg/dL 14 8  Creatinine 0.61 -  1.24 mg/dL 0.74 0.71  Sodium 135 - 145 mmol/L 133(L) 138  Potassium 3.5 - 5.1 mmol/L 4.2 3.7  Chloride 98 - 111 mmol/L 95(L) 100  CO2 22 - 32 mmol/L 29 29  Calcium 8.9 - 10.3 mg/dL 8.7(L) 9.1  Total Protein 6.5 - 8.1 g/dL 7.1 8.0  Total Bilirubin 0.3 - 1.2 mg/dL 0.8 0.7  Alkaline Phos 38 - 126 U/L 54 70  AST 15 - 41 U/L 18 21  ALT 0 - 44 U/L 16 22    No results found for: TSH Lab Results  Component Value Date   ALBUMIN 3.7 08/07/2020   ANIONGAP 9 08/07/2020   No results found for: CHOL, HDL, LDLCALC, CHOLHDL No results found for: TRIG No results found for: HGBA1C    ASSESSMENT & PLAN:   Problem List Items Addressed This Visit      Other   Tobacco abuse    Smokes 18 cigs per day. Discussed smoking cessation techniques, 1-800-quit-now      Bruit of left carotid artery    Doppler was not completed after last visit, I re ordered it today.       Relevant Orders   Lipid Panel w/o Chol/HDL Ratio   Encounter for annual physical exam - Primary    Colon Screening- 2018 Normal PSA- Draw today TDAP- within last 5 years Shingles Vaccine- 2021 Flu Vaccine- 2021 Pneumonia Vaccine- Unsure will check with pharmacy  Healthy eating habits discussed,  Denies urinary hesitancy  Performing UA today to screen for hematuria with his hx of smoking.        Relevant Orders   Lipid Panel w/o Chol/HDL Ratio   PSA   POCT Urinalysis Dipstick (Completed)   Alcohol abuse    28-30 per week beers, discussed cutting back.          No orders of the defined types were placed in this encounter.     Follow-up: No follow-ups on file.    Beckie Salts, [facility] [address] [place], [place] [address]

## 2020-09-06 NOTE — Assessment & Plan Note (Signed)
Doppler was not completed after last visit, I re ordered it today.

## 2020-09-06 NOTE — Progress Notes (Signed)
Established Patient Office Visit  SUBJECTIVE:  Subjective  Patient ID: Joshua Schmidt, male    DOB: [birth year]  Age: 70 y.o. MRN: [id number]  CC:  Chief Complaint  Patient presents with  . Annual Exam    HPI Joshua Schmidt is a 70 y.o. male presenting today for     Past Medical History:  Diagnosis Date  . Erythrocytosis 12/01/2018  . Hypertension   . Myocardial infarction [facility])     Past Surgical History:  Procedure Laterality Date  . APPENDECTOMY    . COLONOSCOPY WITH PROPOFOL N/A 11/20/2014   Procedure: COLONOSCOPY WITH PROPOFOL;  Surgeon: Christene Lye, MD;  Location: [facility] ENDOSCOPY;  Service: Endoscopy;  Laterality: N/A;  . TONSILLECTOMY AND ADENOIDECTOMY      History reviewed. No pertinent family history.  Social History   Socioeconomic History  . Marital status: Married    Spouse name: Not on file  . Number of children: Not on file  . Years of education: Not on file  . Highest education level: Not on file  Occupational History  . Not on file  Tobacco Use  . Smoking status: Current Every Day Smoker    Packs/day: 1.50    Years: 51.00    Pack years: 76.50    Types: Cigarettes  . Smokeless tobacco: Never Used  . Tobacco comment: 1ppd currently  Substance and Sexual Activity  . Alcohol use: No    Alcohol/week: 0.0 standard drinks  . Drug use: No  . Sexual activity: Not on file  Other Topics Concern  . Not on file  Social History Narrative  . Not on file   Social Determinants of Health   Financial Resource Strain: Not on file  Food Insecurity: Not on file  Transportation Needs: Not on file  Physical Activity: Not on file  Stress: Not on file  Social Connections: Not on file  Intimate Partner Violence: Not on file     Current Outpatient Medications:  .  atorvastatin (LIPITOR) 20 MG tablet, Take 1 tablet (20 mg total) by mouth daily., Disp: 90 tablet, Rfl: 3 .  hydrochlorothiazide (HYDRODIURIL) 25 MG tablet, TAKE 1 TABLET BY MOUTH   DAILY, Disp: 90 tablet, Rfl: 3 .  lisinopril (ZESTRIL) 20 MG tablet, Take 1 tablet (20 mg total) by mouth daily., Disp: 90 tablet, Rfl: 3 .  metoprolol tartrate (LOPRESSOR) 50 MG tablet, Take 1 tablet (50 mg total) by mouth daily., Disp: 90 tablet, Rfl: 3   No Known Allergies  ROS Review of Systems  Constitutional: Negative.   HENT: Negative.   Respiratory: Negative.   Cardiovascular: Negative.   Gastrointestinal: Negative.   Musculoskeletal: Negative.   Psychiatric/Behavioral: Negative.      OBJECTIVE:    Physical Exam Vitals reviewed.  Neck:     Vascular: Carotid bruit present.  Cardiovascular:     Rate and Rhythm: Normal rate and regular rhythm.  Musculoskeletal:        General: Normal range of motion.  Skin:    General: Skin is warm.  Neurological:     Mental Status: He is alert.  Psychiatric:        Mood and Affect: Mood normal.     BP (!) 157/96   Pulse 60   Ht 6\' 3"  (1.905 m)   Wt 200 lb 14.4 oz (91.1 kg)   BMI 25.11 kg/m  Wt Readings from Last 3 Encounters:  09/06/20 200 lb 14.4 oz (91.1 kg)  08/30/20 200 lb 12.8 oz (91.1  kg)  08/07/20 204 lb 1.6 oz (92.6 kg)    Health Maintenance Due  Topic Date Due  . Hepatitis C Screening  Never done  . TETANUS/TDAP  Never done  . Zoster Vaccines- Shingrix (1 of 2) Never done  . PNA vac Low Risk Adult (1 of 2 - PCV13) Never done  . COVID-19 Vaccine (3 - Booster for Pfizer series) 11/27/2019    There are no preventive care reminders to display for this patient.  CBC Latest Ref Rng & Units 08/07/2020 07/08/2020 06/10/2020  WBC 4.0 - 10.5 K/uL 9.0 - -  Hemoglobin 13.0 - 17.0 g/dL 16.4 17.5(H) 18.1(H)  Hematocrit 39.0 - 52.0 % 48.7 51.7 53.5(H)  Platelets 150 - 400 K/uL 204 - -   CMP Latest Ref Rng & Units 08/07/2020 11/30/2018  Glucose 70 - 99 mg/dL 117(H) 102(H)  BUN 8 - 23 mg/dL 14 8  Creatinine 0.61 - 1.24 mg/dL 0.74 0.71  Sodium 135 - 145 mmol/L 133(L) 138  Potassium 3.5 - 5.1 mmol/L 4.2 3.7  Chloride 98 -  111 mmol/L 95(L) 100  CO2 22 - 32 mmol/L 29 29  Calcium 8.9 - 10.3 mg/dL 8.7(L) 9.1  Total Protein 6.5 - 8.1 g/dL 7.1 8.0  Total Bilirubin 0.3 - 1.2 mg/dL 0.8 0.7  Alkaline Phos 38 - 126 U/L 54 70  AST 15 - 41 U/L 18 21  ALT 0 - 44 U/L 16 22    No results found for: TSH Lab Results  Component Value Date   ALBUMIN 3.7 08/07/2020   ANIONGAP 9 08/07/2020   No results found for: CHOL, HDL, LDLCALC, CHOLHDL No results found for: TRIG No results found for: HGBA1C    ASSESSMENT & PLAN:   Problem List Items Addressed This Visit      Other   Tobacco abuse    Smokes 18 cigs per day. Discussed smoking cessation techniques, 1-800-quit-now      Bruit of left carotid artery    Doppler was not completed after last visit, I re ordered it today.       Relevant Orders   Lipid Panel w/o Chol/HDL Ratio   Encounter for annual physical exam - Primary    Colon Screening- 2018 Normal PSA- Draw today TDAP- within last 5 years Shingles Vaccine- 2021 Flu Vaccine- 2021 Pneumonia Vaccine- Unsure will check with pharmacy  Healthy eating habits discussed,  Denies urinary hesitancy  Performing UA today to screen for hematuria with his hx of smoking.        Relevant Orders   Lipid Panel w/o Chol/HDL Ratio   PSA   Alcohol abuse    28-30 per week beers, discussed cutting back.          No orders of the defined types were placed in this encounter.     Follow-up: No follow-ups on file.    Beckie Salts, [facility] [address] [place], [place] [address]

## 2020-09-06 NOTE — Assessment & Plan Note (Signed)
28-30 per week beers, discussed cutting back.

## 2020-09-06 NOTE — Assessment & Plan Note (Signed)
Smokes 18 cigs per day. Discussed smoking cessation techniques, 1-800-quit-now

## 2020-09-18 ENCOUNTER — Other Ambulatory Visit: Payer: Self-pay

## 2020-09-18 ENCOUNTER — Inpatient Hospital Stay: Payer: Medicare Other | Attending: Oncology

## 2020-09-18 ENCOUNTER — Inpatient Hospital Stay: Payer: Medicare Other

## 2020-09-18 VITALS — BP 154/83 | HR 63 | Temp 96.7°F | Resp 18

## 2020-09-18 DIAGNOSIS — D751 Secondary polycythemia: Secondary | ICD-10-CM | POA: Diagnosis not present

## 2020-09-18 DIAGNOSIS — Z87891 Personal history of nicotine dependence: Secondary | ICD-10-CM | POA: Diagnosis not present

## 2020-09-18 LAB — HEMOGLOBIN AND HEMATOCRIT, BLOOD
HCT: 50.2 % (ref 39.0–52.0)
Hemoglobin: 16.9 g/dL (ref 13.0–17.0)

## 2020-09-18 NOTE — Patient Instructions (Signed)
[facility]    Discharge Instructions:  Thank you for choosing [facility] to provide your oncology and hematology care.  If you have a lab appointment with the [facility], please go directly to the [facility] and check in at the registration area.  We strive to give you quality time with your provider. You may need to reschedule your appointment if you arrive late (15 or more minutes).  Arriving late affects you and other patients whose appointments are after yours.  Also, if you miss three or more appointments without notifying the office, you may be dismissed from the clinic at the provider's discretion.      For prescription refill requests, have your pharmacy contact our office and allow 72 hours for refills to be completed.    Today you received the following procedure: Therapeutic Phlebotomy.      BELOW ARE SYMPTOMS THAT SHOULD BE REPORTED IMMEDIATELY: . *FEVER GREATER THAN 100.4 F (38 C) OR HIGHER . *CHILLS OR SWEATING . *NAUSEA AND VOMITING THAT IS NOT CONTROLLED WITH YOUR NAUSEA MEDICATION . *UNUSUAL SHORTNESS OF BREATH . *UNUSUAL BRUISING OR BLEEDING . *URINARY PROBLEMS (pain or burning when urinating, or frequent urination) . *BOWEL PROBLEMS (unusual diarrhea, constipation, pain near the anus) . TENDERNESS IN MOUTH AND THROAT WITH OR WITHOUT PRESENCE OF ULCERS (sore throat, sores in mouth, or a toothache) . UNUSUAL RASH, SWELLING OR PAIN  . UNUSUAL VAGINAL DISCHARGE OR ITCHING   Items with * indicate a potential emergency and should be followed up as soon as possible or go to the Emergency Department if any problems should occur.  Should you have questions after your visit or need to cancel or reschedule your appointment, please contact [facility]  [phone] and follow the prompts.  Office hours are 8:00 a.m. to 4:30 p.m. Monday - Friday. Please note that voicemails left after  4:00 p.m. may not be returned until the following business day.  We are closed weekends and major holidays. You have access to a nurse at all times for urgent questions. Please call the main number to the clinic [phone] and follow the prompts.  For any non-urgent questions, you may also contact your provider using MyChart. We now offer e-Visits for anyone 60 and older to request care online for non-urgent symptoms. For details visit [email or web address].   Also download the MyChart app! Go to the app store, search "MyChart", open the app, select [facility], and log in with your MyChart username and password.  Due to Covid, a mask is required upon entering the hospital/clinic. If you do not have a mask, one will be given to you upon arrival. For doctor visits, patients may have 1 support person aged 9 or older with them. For treatment visits, patients cannot have anyone with them due to current Covid guidelines and our immunocompromised population.   Therapeutic Phlebotomy  Therapeutic phlebotomy is the planned removal of blood from a person's body for the purpose of treating a medical condition. The procedure is similar to donating blood. Usually, about a pint (470 mL, or 0.47 L) of blood is removed. The average adult has 9-12 pints (4.3-5.7 L) of blood in the body. Therapeutic phlebotomy may be used to treat the following medical conditions:  Hemochromatosis. This is a condition in which the blood contains too much iron.  Polycythemia vera. This is a condition in which the blood contains too many red blood cells.  Porphyria cutanea  tarda. This is a disease in which an important part of hemoglobin is not made properly. It results in the buildup of abnormal amounts of porphyrins in the body.  Sickle cell disease. This is a condition in which the red blood cells form an abnormal crescent shape rather than a round shape. Tell a health care provider about:  Any allergies you have.  All  medicines you are taking, including vitamins, herbs, eye drops, creams, and over-the-counter medicines.  Any problems you or family members have had with anesthetic medicines.  Any blood disorders you have.  Any surgeries you have had.  Any medical conditions you have.  Whether you are pregnant or may be pregnant. What are the risks? Generally, this is a safe procedure. However, problems may occur, including:  Nausea or light-headedness.  Low blood pressure (hypotension).  Soreness, bleeding, swelling, or bruising at the needle insertion site.  Infection. What happens before the procedure?  Follow instructions from your health care provider about eating or drinking restrictions.  Ask your health care provider about: ? Changing or stopping your regular medicines. This is especially important if you are taking diabetes medicines or blood thinners (anticoagulants). ? Taking medicines such as aspirin and ibuprofen. These medicines can thin your blood. Do not take these medicines unless your health care provider tells you to take them. ? Taking over-the-counter medicines, vitamins, herbs, and supplements.  Wear clothing with sleeves that can be raised above the elbow.  Plan to have someone take you home from the hospital or clinic.  You may have a blood sample taken.  Your blood pressure, pulse rate, and breathing rate will be measured. What happens during the procedure?  To lower your risk of infection: ? Your health care team will wash or sanitize their hands. ? Your skin will be cleaned with an antiseptic.  You may be given a medicine to numb the area (local anesthetic).  A tourniquet will be placed on your arm.  A needle will be inserted into one of your veins.  Tubing and a collection bag will be attached to that needle.  Blood will flow through the needle and tubing into the collection bag.  The collection bag will be placed lower than your arm to allow gravity  to help the flow of blood into the bag.  You may be asked to open and close your hand slowly and continually during the entire collection.  After the specified amount of blood has been removed from your body, the collection bag and tubing will be clamped.  The needle will be removed from your vein.  Pressure will be held on the site of the needle insertion to stop the bleeding.  A bandage (dressing) will be placed over the needle insertion site. The procedure may vary among health care providers and hospitals.   What happens after the procedure?  Your blood pressure, pulse rate, and breathing rate will be measured after the procedure.  You will be encouraged to drink fluids.  Your recovery will be assessed and monitored.  You can return to your normal activities as told by your health care provider. Summary  Therapeutic phlebotomy is the planned removal of blood from a person's body for the purpose of treating a medical condition.  Therapeutic phlebotomy may be used to treat hemochromatosis, polycythemia vera, porphyria cutanea tarda, or sickle cell disease.  In the procedure, a needle is inserted and about a pint (470 mL, or 0.47 L) of blood is removed. The average  adult has 9-12 pints (4.3-5.7 L) of blood in the body.  This is generally a safe procedure, but it can sometimes cause problems such as nausea, light-headedness, or low blood pressure (hypotension). This information is not intended to replace advice given to you by your health care provider. Make sure you discuss any questions you have with your health care provider. Document Revised: 04/15/2017 Document Reviewed: 04/15/2017 Elsevier Patient Education  2021 [place].  Therapeutic Phlebotomy, Care After  This sheet gives you information about how to care for yourself after your procedure. Your health care provider may also give you more specific instructions. If you have problems or questions, contact your health  care provider. What can I expect after the procedure? After the procedure, it is common to have:  Light-headedness or dizziness. You may feel faint.  Nausea.  Tiredness (fatigue). Follow these instructions at home: Eating and drinking  Be sure to eat well-balanced meals for the next 24 hours.  Drink enough fluid to keep your urine pale yellow.  Avoid drinking alcohol on the day that you had the procedure. Activity  Return to your normal activities as told by your health care provider. Most people can go back to their normal activities right away.  Avoid activities that take a lot of effort for about 5 hours after the procedure. Athletes should avoid strenuous exercise for at least 12 hours.  Avoid heavy lifting or pulling for about 5 hours after the procedure. Do not lift anything that is heavier than 10 lb (4.5 kg).  Change positions slowly for the remainder of the day. This will help to prevent light-headedness or fainting.  If you feel light-headed, lie down until the feeling goes away.   Needle insertion site care  Keep your bandage (dressing) dry. You can remove the bandage after about 5 hours or as told by your health care provider.  If you have bleeding from the needle insertion site, raise (elevate) your arm and press firmly on the site until the bleeding stops.  If you have bruising at the site, apply ice to the area: ? Remove the dressing. ? Put ice in a plastic bag. ? Place a towel between your skin and the bag. ? Leave the ice on for 20 minutes, 2-3 times a day for the first 24 hours.  If the swelling does not go away after 24 hours, apply a warm, moist cloth (warm compress) to the area for 20 minutes, 2-3 times a day.   General instructions  Do not use any products that contain nicotine or tobacco, such as cigarettes and e-cigarettes, for at least 30 minutes after the procedure.  Keep all follow-up visits as told by your health care provider. This is  important. You may need to continue having regular therapeutic phlebotomy treatments as directed. Contact a health care provider if you:  Have redness, swelling, or pain at the needle insertion site.  Have fluid or blood coming from the needle insertion site.  Have pus or a bad smell coming from the needle insertion site.  Notice that the needle insertion site feels warm to the touch.  Feel light-headed, dizzy, or nauseous, and the feeling does not go away.  Have new bruising at the needle insertion site.  Feel weaker than normal.  Have a fever or chills. Get help right away if:  You faint.  You have chest pain.  You have trouble breathing.  You have severe nausea or vomiting. Summary  After the procedure, it  is common to have some light-headedness, dizziness, nausea, or tiredness (fatigue).  Be sure to eat well-balanced meals for the next 24 hours. Drink enough fluid to keep your urine pale yellow.  Return to your normal activities as told by your health care provider.  Keep all follow-up visits as told by your health care provider. You may need to continue having regular therapeutic phlebotomy treatments as directed. This information is not intended to replace advice given to you by your health care provider. Make sure you discuss any questions you have with your health care provider. Document Revised: 04/16/2017 Document Reviewed: 04/15/2017 Elsevier Patient Education  2021 [place].  Therapeutic Phlebotomy Discharge Instructions  - Increase your fluid intake over the next 4 hours  - No smoking for 30 minutes  - Avoid using the affected arm (the one you had the blood drawn from) for heavy lifting or other activities.  - You may resume all normal activities after 30 minutes.  You are to notify the office if you experience:   - Persistent dizziness and/or lightheadedness -Uncontrolled or excessive bleeding at the site.

## 2020-09-27 ENCOUNTER — Ambulatory Visit: Payer: Medicare Other | Admitting: Family Medicine

## 2020-10-07 ENCOUNTER — Other Ambulatory Visit: Payer: Self-pay | Admitting: Internal Medicine

## 2020-10-16 ENCOUNTER — Ambulatory Visit
Admission: RE | Admit: 2020-10-16 | Discharge: 2020-10-16 | Disposition: A | Discharge status: Home / Self Care | Payer: Medicare Other | Source: Ambulatory Visit | Attending: Family Medicine | Admitting: Family Medicine

## 2020-10-16 ENCOUNTER — Other Ambulatory Visit: Payer: Self-pay

## 2020-10-16 DIAGNOSIS — R0989 Other specified symptoms and signs involving the circulatory and respiratory systems: Secondary | ICD-10-CM | POA: Diagnosis not present

## 2020-10-30 ENCOUNTER — Inpatient Hospital Stay: Payer: Medicare Other

## 2020-10-30 ENCOUNTER — Inpatient Hospital Stay: Payer: Medicare Other | Attending: Oncology

## 2020-10-30 VITALS — BP 129/86 | HR 78 | Temp 98.0°F | Resp 20

## 2020-10-30 DIAGNOSIS — F17210 Nicotine dependence, cigarettes, uncomplicated: Secondary | ICD-10-CM | POA: Insufficient documentation

## 2020-10-30 DIAGNOSIS — D751 Secondary polycythemia: Secondary | ICD-10-CM | POA: Insufficient documentation

## 2020-10-30 LAB — HEMOGLOBIN AND HEMATOCRIT, BLOOD
HCT: 51 % (ref 39.0–52.0)
Hemoglobin: 16.8 g/dL (ref 13.0–17.0)

## 2020-10-30 NOTE — Patient Instructions (Signed)
[facility] [place] [facility]  Discharge Instructions: Thank you for choosing [facility] to provide your oncology and hematology care.  If you have a lab appointment with the Cancer Center, please go directly to the Cancer Center and check in at the registration area.  Wear comfortable clothing and clothing appropriate for easy access to any Portacath or PICC line.   We strive to give you quality time with your provider. You may need to reschedule your appointment if you arrive late (15 or more minutes).  Arriving late affects you and other patients whose appointments are after yours.  Also, if you miss three or more appointments without notifying the office, you may be dismissed from the clinic at the provider's discretion.      For prescription refill requests, have your pharmacy contact our office and allow 72 hours for refills to be completed.      To help prevent nausea and vomiting after your treatment, we encourage you to take your nausea medication as directed.  BELOW ARE SYMPTOMS THAT SHOULD BE REPORTED IMMEDIATELY: *FEVER GREATER THAN 100.4 F (38 C) OR HIGHER *CHILLS OR SWEATING *NAUSEA AND VOMITING THAT IS NOT CONTROLLED WITH YOUR NAUSEA MEDICATION *UNUSUAL SHORTNESS OF BREATH *UNUSUAL BRUISING OR BLEEDING *URINARY PROBLEMS (pain or burning when urinating, or frequent urination) *BOWEL PROBLEMS (unusual diarrhea, constipation, pain near the anus) TENDERNESS IN MOUTH AND THROAT WITH OR WITHOUT PRESENCE OF ULCERS (sore throat, sores in mouth, or a toothache) UNUSUAL RASH, SWELLING OR PAIN  UNUSUAL VAGINAL DISCHARGE OR ITCHING   Items with * indicate a potential emergency and should be followed up as soon as possible or go to the Emergency Department if any problems should occur.  Please show the CHEMOTHERAPY ALERT CARD or IMMUNOTHERAPY ALERT CARD at check-in to the Emergency Department and triage nurse.  Should you have questions after your  visit or need to cancel or reschedule your appointment, please contact [facility] [place] [facility]  [phone] and follow the prompts.  Office hours are 8:00 a.m. to 4:30 p.m. Monday - Friday. Please note that voicemails left after 4:00 p.m. may not be returned until the following business day.  We are closed weekends and major holidays. You have access to a nurse at all times for urgent questions. Please call the main number to the clinic [phone] and follow the prompts.  For any non-urgent questions, you may also contact your provider using MyChart. We now offer e-Visits for anyone 18 and older to request care online for non-urgent symptoms. For details visit [email or web address].   Also download the MyChart app! Go to the app store, search "MyChart", open the app, select [facility], and log in with your MyChart username and password.  Due to Covid, a mask is required upon entering the hospital/clinic. If you do not have a mask, one will be given to you upon arrival. For doctor visits, patients may have 1 support person aged 18 or older with them. For treatment visits, patients cannot have anyone with them due to current Covid guidelines and our immunocompromised population.  

## 2020-12-03 ENCOUNTER — Encounter: Payer: Self-pay | Admitting: Internal Medicine

## 2020-12-03 ENCOUNTER — Other Ambulatory Visit: Payer: Self-pay

## 2020-12-03 ENCOUNTER — Ambulatory Visit (INDEPENDENT_AMBULATORY_CARE_PROVIDER_SITE_OTHER): Payer: Medicare Other | Admitting: Internal Medicine

## 2020-12-03 VITALS — BP 130/80 | Ht 75.0 in | Wt 207.3 lb

## 2020-12-03 DIAGNOSIS — F1010 Alcohol abuse, uncomplicated: Secondary | ICD-10-CM

## 2020-12-03 DIAGNOSIS — D751 Secondary polycythemia: Secondary | ICD-10-CM | POA: Diagnosis not present

## 2020-12-03 DIAGNOSIS — R0989 Other specified symptoms and signs involving the circulatory and respiratory systems: Secondary | ICD-10-CM

## 2020-12-03 DIAGNOSIS — I82401 Acute embolism and thrombosis of unspecified deep veins of right lower extremity: Secondary | ICD-10-CM

## 2020-12-03 DIAGNOSIS — Z720 Tobacco use: Secondary | ICD-10-CM | POA: Diagnosis not present

## 2020-12-03 NOTE — Assessment & Plan Note (Addendum)
Minimal to moderate amount of bilateral atherosclerotic plaque, left greater than right,  not resulting in a hemodynamically significant stenosis within either internal carotid artery  Noted on [place], ref to vascular

## 2020-12-03 NOTE — Assessment & Plan Note (Signed)
We will check CBC

## 2020-12-03 NOTE — Assessment & Plan Note (Signed)
-   I instructed the patient to stop smoking and provided them with smoking cessation materials.  - I informed the patient that smoking puts them at increased risk for cancer, COPD, hypertension, and more.  - Informed the patient to seek help if they begin to have trouble breathing, develop chest pain, start to cough up blood, feel faint, or pass out.  

## 2020-12-03 NOTE — Assessment & Plan Note (Signed)
Advised to cut down on drinking alcohol

## 2020-12-03 NOTE — Progress Notes (Signed)
Established Patient Office Visit  Subjective:  Patient ID: Joshua Schmidt, male    DOB: [birth year]  Age: 70 y.o. MRN: [id number]  CC:  Chief Complaint  Patient presents with   Ankle Pain    Patient complains of having right ankle pain x 3 weeks with swelling of the right leg. Patient states that swelling has improved but still having the pain in the ankle.     Ankle Pain  The incident occurred more than 1 week ago. Associated symptoms include an inability to bear weight.  Leg Pain  The incident occurred more than 1 week ago. The injury mechanism was a burn. The pain is at a severity of 5/10. The pain is mild. The pain has been Fluctuating since onset. Associated symptoms include an inability to bear weight.   [facility] presents for rt leg swelling  Past Medical History:  Diagnosis Date   Erythrocytosis 12/01/2018   Hypertension    Myocardial infarction [facility])     Past Surgical History:  Procedure Laterality Date   APPENDECTOMY     COLONOSCOPY WITH PROPOFOL N/A 11/20/2014   Procedure: COLONOSCOPY WITH PROPOFOL;  Surgeon: Christene Lye, MD;  Location: [facility] ENDOSCOPY;  Service: Endoscopy;  Laterality: N/A;   TONSILLECTOMY AND ADENOIDECTOMY      History reviewed. No pertinent family history.  Social History   Socioeconomic History   Marital status: Married    Spouse name: Not on file   Number of children: Not on file   Years of education: Not on file   Highest education level: Not on file  Occupational History   Not on file  Tobacco Use   Smoking status: Every Day    Packs/day: 1.50    Years: 51.00    Pack years: 76.50    Types: Cigarettes   Smokeless tobacco: Never   Tobacco comments:    1ppd currently  Substance and Sexual Activity   Alcohol use: No    Alcohol/week: 0.0 standard drinks   Drug use: No   Sexual activity: Not on file  Other Topics Concern   Not on file  Social History Narrative   Not on file   Social Determinants of Health    Financial Resource Strain: Not on file  Food Insecurity: Not on file  Transportation Needs: Not on file  Physical Activity: Not on file  Stress: Not on file  Social Connections: Not on file  Intimate Partner Violence: Not on file     Current Outpatient Medications:    atorvastatin (LIPITOR) 20 MG tablet, TAKE 1 TABLET BY MOUTH  DAILY, Disp: 90 tablet, Rfl: 3   hydrochlorothiazide (HYDRODIURIL) 25 MG tablet, TAKE 1 TABLET BY MOUTH  DAILY, Disp: 90 tablet, Rfl: 3   lisinopril (ZESTRIL) 20 MG tablet, TAKE 1 TABLET BY MOUTH  DAILY, Disp: 90 tablet, Rfl: 3   metoprolol tartrate (LOPRESSOR) 50 MG tablet, TAKE 1 TABLET BY MOUTH  DAILY, Disp: 90 tablet, Rfl: 3   No Known Allergies  ROS Review of Systems  Constitutional: Negative.   HENT: Negative.    Eyes: Negative.   Respiratory: Negative.    Cardiovascular: Negative.   Gastrointestinal: Negative.   Endocrine: Negative.   Genitourinary: Negative.   Skin: Negative.   Allergic/Immunologic: Negative.   Neurological: Negative.   Hematological: Negative.   Psychiatric/Behavioral: Negative.    All other systems reviewed and are negative.    Objective:    Physical Exam Vitals reviewed.  Constitutional:  Appearance: Normal appearance.  HENT:     Mouth/Throat:     Mouth: Mucous membranes are moist.  Eyes:     Pupils: Pupils are equal, round, and reactive to light.  Neck:     Vascular: No carotid bruit.  Cardiovascular:     Rate and Rhythm: Normal rate and regular rhythm.     Pulses: Normal pulses.     Heart sounds: Normal heart sounds.  Pulmonary:     Effort: Pulmonary effort is normal.     Breath sounds: Normal breath sounds.  Abdominal:     General: Bowel sounds are normal.     Palpations: Abdomen is soft. There is no hepatomegaly, splenomegaly or mass.     Tenderness: There is no abdominal tenderness.     Hernia: No hernia is present.  Musculoskeletal:        General: Swelling present.     Cervical back: Neck  supple.     Right lower leg: No edema.     Left lower leg: No edema.  Skin:    Coloration: Skin is not jaundiced.     Findings: No rash.  Neurological:     General: No focal deficit present.     Mental Status: He is alert and oriented to person, place, and time.     Motor: No weakness.  Psychiatric:        Mood and Affect: Mood normal.        Behavior: Behavior normal.    BP 130/80   Ht '6\' 3"'$  (1.905 m)   Wt 207 lb 4.8 oz (94 kg)   BMI 25.91 kg/m  Wt Readings from Last 3 Encounters:  12/03/20 207 lb 4.8 oz (94 kg)  09/06/20 200 lb 14.4 oz (91.1 kg)  08/30/20 200 lb 12.8 oz (91.1 kg)     Health Maintenance Due  Topic Date Due   Hepatitis C Screening  Never done   TETANUS/TDAP  Never done   Zoster Vaccines- Shingrix (2 of 2) 07/05/2014   PNA vac Low Risk Adult (1 of 2 - PCV13) Never done   COVID-19 Vaccine (5 - Booster for Pfizer series) 11/25/2020   INFLUENZA VACCINE  11/11/2020    There are no preventive care reminders to display for this patient.  No results found for: TSH Lab Results  Component Value Date   WBC 9.0 08/07/2020   HGB 16.8 10/30/2020   HCT 51.0 10/30/2020   MCV 96.4 08/07/2020   PLT 204 08/07/2020   Lab Results  Component Value Date   NA 133 (L) 08/07/2020   K 4.2 08/07/2020   CO2 29 08/07/2020   GLUCOSE 117 (H) 08/07/2020   BUN 14 08/07/2020   CREATININE 0.74 08/07/2020   BILITOT 0.8 08/07/2020   ALKPHOS 54 08/07/2020   AST 18 08/07/2020   ALT 16 08/07/2020   PROT 7.1 08/07/2020   ALBUMIN 3.7 08/07/2020   CALCIUM 8.7 (L) 08/07/2020   ANIONGAP 9 08/07/2020   No results found for: CHOL No results found for: HDL No results found for: LDLCALC No results found for: TRIG No results found for: CHOLHDL No results found for: HGBA1C    Assessment & Plan:   Problem List Items Addressed This Visit       Cardiovascular and Mediastinum   Acute deep vein thrombosis (DVT) of right lower extremity ([place])    We will get an ultrasound of the  right leg        Other   Tobacco abuse -  Primary    - I instructed the patient to stop smoking and provided them with smoking cessation materials.  - I informed the patient that smoking puts them at increased risk for cancer, COPD, hypertension, and more.  - Informed the patient to seek help if they begin to have trouble breathing, develop chest pain, start to cough up blood, feel faint, or pass out.      Erythrocytosis    We will check CBC      Bruit of left carotid artery    Minimal to moderate amount of bilateral atherosclerotic plaque, left greater than right,  not resulting in a hemodynamically significant stenosis within either internal carotid artery  Noted on [place], ref to vascular      Alcohol abuse    Advised to cut down on drinking alcohol       No orders of the defined types were placed in this encounter.   Follow-up: No follow-ups on file.    Cletis Athens, MD

## 2020-12-03 NOTE — Assessment & Plan Note (Signed)
We will get an ultrasound of the right leg

## 2020-12-05 NOTE — Addendum Note (Signed)
Addended by: Alois Cliche on: 12/05/2020 03:57 PM   Modules accepted: Orders

## 2020-12-10 ENCOUNTER — Other Ambulatory Visit (INDEPENDENT_AMBULATORY_CARE_PROVIDER_SITE_OTHER): Payer: Self-pay | Admitting: Nurse Practitioner

## 2020-12-10 DIAGNOSIS — I82401 Acute embolism and thrombosis of unspecified deep veins of right lower extremity: Secondary | ICD-10-CM

## 2020-12-11 ENCOUNTER — Inpatient Hospital Stay: Payer: Medicare Other | Attending: Oncology

## 2020-12-11 ENCOUNTER — Inpatient Hospital Stay (HOSPITAL_BASED_OUTPATIENT_CLINIC_OR_DEPARTMENT_OTHER): Payer: Medicare Other | Admitting: Oncology

## 2020-12-11 ENCOUNTER — Encounter: Payer: Self-pay | Admitting: Oncology

## 2020-12-11 ENCOUNTER — Other Ambulatory Visit: Payer: Self-pay

## 2020-12-11 ENCOUNTER — Inpatient Hospital Stay: Payer: Medicare Other | Admitting: Oncology

## 2020-12-11 ENCOUNTER — Inpatient Hospital Stay: Payer: Medicare Other

## 2020-12-11 DIAGNOSIS — D751 Secondary polycythemia: Secondary | ICD-10-CM | POA: Diagnosis not present

## 2020-12-11 DIAGNOSIS — F17210 Nicotine dependence, cigarettes, uncomplicated: Secondary | ICD-10-CM | POA: Diagnosis not present

## 2020-12-11 DIAGNOSIS — I252 Old myocardial infarction: Secondary | ICD-10-CM | POA: Insufficient documentation

## 2020-12-11 DIAGNOSIS — Z87891 Personal history of nicotine dependence: Secondary | ICD-10-CM

## 2020-12-11 DIAGNOSIS — G4733 Obstructive sleep apnea (adult) (pediatric): Secondary | ICD-10-CM | POA: Insufficient documentation

## 2020-12-11 DIAGNOSIS — Z79899 Other long term (current) drug therapy: Secondary | ICD-10-CM | POA: Insufficient documentation

## 2020-12-11 LAB — CBC WITH DIFFERENTIAL/PLATELET
Abs Immature Granulocytes: 0.04 10*3/uL (ref 0.00–0.07)
Basophils Absolute: 0.1 10*3/uL (ref 0.0–0.1)
Basophils Relative: 1 %
Eosinophils Absolute: 0.2 10*3/uL (ref 0.0–0.5)
Eosinophils Relative: 2 %
HCT: 47.8 % (ref 39.0–52.0)
Hemoglobin: 15.6 g/dL (ref 13.0–17.0)
Immature Granulocytes: 1 %
Lymphocytes Relative: 16 %
Lymphs Abs: 1.2 10*3/uL (ref 0.7–4.0)
MCH: 29.9 pg (ref 26.0–34.0)
MCHC: 32.6 g/dL (ref 30.0–36.0)
MCV: 91.7 fL (ref 80.0–100.0)
Monocytes Absolute: 0.7 10*3/uL (ref 0.1–1.0)
Monocytes Relative: 9 %
Neutro Abs: 5.3 10*3/uL (ref 1.7–7.7)
Neutrophils Relative %: 71 %
Platelets: 220 10*3/uL (ref 150–400)
RBC: 5.21 MIL/uL (ref 4.22–5.81)
RDW: 15.9 % — ABNORMAL HIGH (ref 11.5–15.5)
WBC: 7.4 10*3/uL (ref 4.0–10.5)
nRBC: 0 % (ref 0.0–0.2)

## 2020-12-11 NOTE — Progress Notes (Addendum)
Hematology/Oncology  Follow up note [facility] [phone] [phone]   Patient Care Team: Cletis Athens, MD as PCP - General (Internal Medicine) Lorelee Market, MD (Family Medicine) Christene Lye, MD (General Surgery) Earlie Server, MD as Consulting Physician (Oncology)  REFERRING PROVIDER: Cletis Athens, MD  CHIEF COMPLAINTS/REASON FOR VISIT:  Follow up for erythrocytosis.  I connected with DARROL BABIN on 12/11/20 at  3:00 PM EDT by telephone visit and verified that I am speaking with the correct person using two identifiers.   I discussed the limitations, risks, security and privacy concerns of performing an evaluation and management service by telemedicine and the availability of in-person appointments. I also discussed with the patient that there may be a patient responsible charge related to this service. The patient expressed understanding and agreed to proceed.   Other persons participating in the visit and their role in the encounter: None   Patient's location: Home  Provider's location: Clinic    HISTORY OF PRESENTING ILLNESS:  Joshua Schmidt is a 70 y.o. male who was seen in consultation at the request of Cletis Athens, MD for evaluation of polycytosis/erythrocytosis Patient had lab work done with primary care provider and was found to have high hemoglobin.  Lab results are not available to me.  Patient was referred to heme-onc for further evaluation and discussion. Associated signs or symptoms: Denies weight loss, fever, chills, fatigue, night sweats.   Context:  Smoking history: 76.5 pack year smoking history, he reports that he quitted in the past and then smoke again Testosterone supplements: Denies History of blood clots: Denies Daytime somnolence: Denies Family history of polycythemia: Denies Denies any personal history of thrombosis, stroke, heart attack.  Denies any skin itchiness.  He is a retired  Clinical biochemist, remains active.  Mainly works outdoors. Sleep apnea, not interested in using CPAP machine  INTERVAL HISTORY Joshua Schmidt is a 70 year old male with above history who presents today for follow-up for erythrocytosis.  He receives intermittent phlebotomies last on 10/30/2020.  He continues to smoke cigarettes but is working on stopping. Smoking about 18/day. Feels well.   Having right lower extremity swelling which has been present for greater then one month. He is scheduled for ultrasound to rule out blood clot tomorrow. He has follow-up with vein and vascular.    Review of Systems  Constitutional:  Negative for appetite change, chills, diaphoresis, fatigue, fever and unexpected weight change.  HENT:   Negative for hearing loss, lump/mass, nosebleeds, sore throat and voice change.   Eyes:  Negative for eye problems and icterus.  Respiratory:  Negative for chest tightness, cough, hemoptysis, shortness of breath and wheezing.   Cardiovascular:  Positive for leg swelling. Negative for chest pain.  Gastrointestinal:  Negative for abdominal distention, abdominal pain, blood in stool, diarrhea, nausea and rectal pain.  Endocrine: Negative for hot flashes.  Genitourinary:  Negative for bladder incontinence, difficulty urinating, dysuria, frequency, hematuria and nocturia.   Musculoskeletal:  Negative for arthralgias, back pain, flank pain, gait problem and myalgias.  Skin:  Negative for itching and rash.  Neurological:  Negative for dizziness, gait problem, headaches, light-headedness, numbness and seizures.  Hematological:  Negative for adenopathy. Does not bruise/bleed easily.  Psychiatric/Behavioral:  Negative for confusion and decreased concentration. The patient is not nervous/anxious.    MEDICAL HISTORY:  Past Medical History:  Diagnosis Date   Erythrocytosis 12/01/2018   Hypertension    Myocardial infarction [facility])     SURGICAL HISTORY: Past Surgical  History:  Procedure  Laterality Date   APPENDECTOMY     COLONOSCOPY WITH PROPOFOL N/A 11/20/2014   Procedure: COLONOSCOPY WITH PROPOFOL;  Surgeon: Christene Lye, MD;  Location: [facility] ENDOSCOPY;  Service: Endoscopy;  Laterality: N/A;   TONSILLECTOMY AND ADENOIDECTOMY      SOCIAL HISTORY: Social History   Socioeconomic History   Marital status: Married    Spouse name: Not on file   Number of children: Not on file   Years of education: Not on file   Highest education level: Not on file  Occupational History   Not on file  Tobacco Use   Smoking status: Every Day    Packs/day: 1.50    Years: 51.00    Pack years: 76.50    Types: Cigarettes   Smokeless tobacco: Never   Tobacco comments:    1ppd currently  Substance and Sexual Activity   Alcohol use: No    Alcohol/week: 0.0 standard drinks   Drug use: No   Sexual activity: Not on file  Other Topics Concern   Not on file  Social History Narrative   Not on file   Social Determinants of Health   Financial Resource Strain: Not on file  Food Insecurity: Not on file  Transportation Needs: Not on file  Physical Activity: Not on file  Stress: Not on file  Social Connections: Not on file  Intimate Partner Violence: Not on file    FAMILY HISTORY: No family history on file.  ALLERGIES:  has No Known Allergies.  MEDICATIONS:  Current Outpatient Medications  Medication Sig Dispense Refill   atorvastatin (LIPITOR) 20 MG tablet TAKE 1 TABLET BY MOUTH  DAILY 90 tablet 3   hydrochlorothiazide (HYDRODIURIL) 25 MG tablet TAKE 1 TABLET BY MOUTH  DAILY 90 tablet 3   lisinopril (ZESTRIL) 20 MG tablet TAKE 1 TABLET BY MOUTH  DAILY 90 tablet 3   metoprolol tartrate (LOPRESSOR) 50 MG tablet TAKE 1 TABLET BY MOUTH  DAILY 90 tablet 3   No current facility-administered medications for this visit.     PHYSICAL EXAMINATION: ECOG PERFORMANCE STATUS: 0 - Asymptomatic There were no vitals filed for this visit.  There were no vitals filed for this  visit.   Physical Exam Neurological:     Mental Status: He is alert and oriented to person, place, and time.    RADIOGRAPHIC STUDIES: I have personally reviewed the radiological images as listed and agreed with the findings in the report. No results found.   LABORATORY DATA:  I have reviewed the data as listed Lab Results  Component Value Date   WBC 7.4 12/11/2020   HGB 15.6 12/11/2020   HCT 47.8 12/11/2020   MCV 91.7 12/11/2020   PLT 220 12/11/2020   Recent Labs    08/07/20 1328  NA 133*  K 4.2  CL 95*  CO2 29  GLUCOSE 117*  BUN 14  CREATININE 0.74  CALCIUM 8.7*  GFRNONAA >60  PROT 7.1  ALBUMIN 3.7  AST 18  ALT 16  ALKPHOS 54  BILITOT 0.8    Iron/TIBC/Ferritin/ %Sat No results found for: IRON, TIBC, FERRITIN, IRONPCTSAT   ASSESSMENT & PLAN:  No diagnosis found.  Secondary erythrocytosis- Due to smoking.  Last phlebotomy was on 10/30/2020.  Goal hematocrit is less than 50.  Labs from today show hematocrit 47.8.  He does not need a phlebotomy today.  Return to clinic every 6 weeks for lab work and possible phlebotomy.  He will see Dr. Tasia Catchings back in  18 weeks with labs, MD assessment and possible phlebotomy.  Patient knows he can be seen in clinic sooner if needed.  OSA- This is likely also contributing some to his elevated hemoglobin.  He is not interested in using his CPAP machine.  Tobacco abuse- He is currently trying to decrease the amount of cigarettes he is smoking.  He is involved in the low-dose CT screening program.  His last scan was on 11/02/2019.  We will see why his follow-up CT scan has not taken place yet.  New referral sent to low-dose CT screening program.  Right lower extremity swelling- He is scheduled for a ultrasound of his right lower extremity tomorrow and has follow-up with vein and vascular shortly after.  Disposition- RTC every 6 weeks for lab work (H&H) with possible phlebotomy.  He will see Dr. Tasia Catchings in 18 weeks with lab work and possible  phlebotomy.  I provided 20 minutes of non face-to-face telephone visit time during this encounter, and > 50% was spent counseling as documented under my assessment & plan.   No orders of the defined types were placed in this encounter.  Faythe Casa, NP 12/11/2020 3:55 PM

## 2020-12-11 NOTE — Progress Notes (Signed)
   This encounter was created in error - please disregard.  Faythe Casa, NP 12/12/2020 8:17 AM

## 2020-12-11 NOTE — Progress Notes (Signed)
Patient verified using two identifiers for virtual visit via telephone today.  Patient has problems leg swelling that is being worked up by cardiologist.

## 2020-12-12 ENCOUNTER — Ambulatory Visit (INDEPENDENT_AMBULATORY_CARE_PROVIDER_SITE_OTHER): Payer: Medicare Other | Admitting: Nurse Practitioner

## 2020-12-12 ENCOUNTER — Encounter: Payer: Self-pay | Admitting: Oncology

## 2020-12-12 ENCOUNTER — Ambulatory Visit (INDEPENDENT_AMBULATORY_CARE_PROVIDER_SITE_OTHER): Payer: Medicare Other

## 2020-12-12 VITALS — BP 150/92 | HR 57 | Ht 74.0 in | Wt 206.0 lb

## 2020-12-12 DIAGNOSIS — I82401 Acute embolism and thrombosis of unspecified deep veins of right lower extremity: Secondary | ICD-10-CM | POA: Diagnosis not present

## 2020-12-12 DIAGNOSIS — M7989 Other specified soft tissue disorders: Secondary | ICD-10-CM | POA: Diagnosis not present

## 2020-12-18 ENCOUNTER — Other Ambulatory Visit (INDEPENDENT_AMBULATORY_CARE_PROVIDER_SITE_OTHER): Payer: Self-pay | Admitting: Nurse Practitioner

## 2020-12-18 DIAGNOSIS — M7989 Other specified soft tissue disorders: Secondary | ICD-10-CM

## 2020-12-18 DIAGNOSIS — M79606 Pain in leg, unspecified: Secondary | ICD-10-CM

## 2020-12-20 ENCOUNTER — Other Ambulatory Visit: Payer: Self-pay

## 2020-12-20 ENCOUNTER — Ambulatory Visit (INDEPENDENT_AMBULATORY_CARE_PROVIDER_SITE_OTHER): Payer: Medicare Other

## 2020-12-20 ENCOUNTER — Encounter (INDEPENDENT_AMBULATORY_CARE_PROVIDER_SITE_OTHER): Payer: Self-pay | Admitting: Nurse Practitioner

## 2020-12-20 ENCOUNTER — Ambulatory Visit (INDEPENDENT_AMBULATORY_CARE_PROVIDER_SITE_OTHER): Payer: Medicare Other | Admitting: Nurse Practitioner

## 2020-12-20 ENCOUNTER — Encounter: Payer: Self-pay | Admitting: Oncology

## 2020-12-20 VITALS — BP 161/87 | HR 66 | Ht 75.0 in | Wt 200.0 lb

## 2020-12-20 DIAGNOSIS — R600 Localized edema: Secondary | ICD-10-CM

## 2020-12-20 DIAGNOSIS — M79606 Pain in leg, unspecified: Secondary | ICD-10-CM

## 2020-12-20 DIAGNOSIS — M7989 Other specified soft tissue disorders: Secondary | ICD-10-CM

## 2020-12-20 DIAGNOSIS — I83811 Varicose veins of right lower extremities with pain: Secondary | ICD-10-CM | POA: Diagnosis not present

## 2020-12-21 ENCOUNTER — Encounter (INDEPENDENT_AMBULATORY_CARE_PROVIDER_SITE_OTHER): Payer: Self-pay | Admitting: Nurse Practitioner

## 2020-12-21 NOTE — Progress Notes (Signed)
Subjective:    Patient ID: Joshua Schmidt, male    DOB: [birth year], 70 y.o.   MRN: [id number] Chief Complaint  Patient presents with   New Patient (Initial Visit)    Np DVT and consult . DVT of R LE unspecified vein. Referred by Barbara Cower is a 70 year old male that presents today for evaluation of right lower extremity leg swelling.  The patient notes that he had a heart attack about a year and a half ago and ever since then he would have periodic ankle swelling that would only last a day or 2.  However he noticed that about 3 weeks ago his right leg swelled greatly from the knee down.  He notes that he had not been doing anything different such as taking a long trip or standing for a long period when this happened.  The patient is and the swelling lasted for approximately 7 to 10 days and then resolved.  There was concern by his primary care physician that he may have developed a DVT.  He denies any redness or pain in the area.  He denies any fevers or chills.  He denies any chest pain shortness of breath.  Today noninvasive studies show no evidence of DVT or superficial thrombophlebitis in the right lower extremity   Review of Systems  Cardiovascular:  Positive for leg swelling.  All other systems reviewed and are negative.     Objective:   Physical Exam Vitals reviewed.  HENT:     Head: Normocephalic.  Cardiovascular:     Rate and Rhythm: Normal rate.  Pulmonary:     Effort: Pulmonary effort is normal.  Musculoskeletal:     Right lower leg: Edema present.  Neurological:     Mental Status: He is alert and oriented to person, place, and time.  Psychiatric:        Mood and Affect: Mood normal.        Behavior: Behavior normal.        Thought Content: Thought content normal.        Judgment: Judgment normal.    BP (!) 150/92   Pulse (!) 57   Ht '6\' 2"'$  (1.88 m)   Wt 206 lb (93.4 kg)   BMI 26.45 kg/m   Past Medical History:  Diagnosis Date   Erythrocytosis  12/01/2018   Hypertension    Myocardial infarction [facility])     Social History   Socioeconomic History   Marital status: Married    Spouse name: Not on file   Number of children: Not on file   Years of education: Not on file   Highest education level: Not on file  Occupational History   Not on file  Tobacco Use   Smoking status: Every Day    Packs/day: 1.50    Years: 51.00    Pack years: 76.50    Types: Cigarettes   Smokeless tobacco: Never   Tobacco comments:    1ppd currently  Substance and Sexual Activity   Alcohol use: No    Alcohol/week: 0.0 standard drinks   Drug use: No   Sexual activity: Not on file  Other Topics Concern   Not on file  Social History Narrative   Not on file   Social Determinants of Health   Financial Resource Strain: Not on file  Food Insecurity: Not on file  Transportation Needs: Not on file  Physical Activity: Not on file  Stress: Not on file  Social  Connections: Not on file  Intimate Partner Violence: Not on file    Past Surgical History:  Procedure Laterality Date   APPENDECTOMY     COLONOSCOPY WITH PROPOFOL N/A 11/20/2014   Procedure: COLONOSCOPY WITH PROPOFOL;  Surgeon: Christene Lye, MD;  Location: [facility] ENDOSCOPY;  Service: Endoscopy;  Laterality: N/A;   TONSILLECTOMY AND ADENOIDECTOMY      History reviewed. No pertinent family history.  No Known Allergies  CBC Latest Ref Rng & Units 12/11/2020 10/30/2020 09/18/2020  WBC 4.0 - 10.5 K/uL 7.4 - -  Hemoglobin 13.0 - 17.0 g/dL 15.6 16.8 16.9  Hematocrit 39.0 - 52.0 % 47.8 51.0 50.2  Platelets 150 - 400 K/uL 220 - -      CMP     Component Value Date/Time   NA 133 (L) 08/07/2020 1328   K 4.2 08/07/2020 1328   CL 95 (L) 08/07/2020 1328   CO2 29 08/07/2020 1328   GLUCOSE 117 (H) 08/07/2020 1328   BUN 14 08/07/2020 1328   CREATININE 0.74 08/07/2020 1328   CALCIUM 8.7 (L) 08/07/2020 1328   PROT 7.1 08/07/2020 1328   ALBUMIN 3.7 08/07/2020 1328   AST 18 08/07/2020 1328    ALT 16 08/07/2020 1328   ALKPHOS 54 08/07/2020 1328   BILITOT 0.8 08/07/2020 1328   GFRNONAA >60 08/07/2020 1328   GFRAA >60 11/30/2018 1558     No results found.     Assessment & Plan:   1. Swelling of limb I have had a long discussion with the patient regarding swelling and why it  causes symptoms.  Patient will begin wearing graduated compression stockings class 1 (20-30 mmHg) on a daily basis a prescription was given. The patient will  beginning wearing the stockings first thing in the morning and removing them in the evening. The patient is instructed specifically not to sleep in the stockings.   In addition, behavioral modification will be initiated.  This will include frequent elevation, use of over the counter pain medications and exercise such as walking.  I have reviewed systemic causes for chronic edema such as liver, kidney and cardiac etiologies.  The patient denies problems with these organ systems.     Patient should undergo duplex ultrasound of the venous system to ensure that reflux is not present.  The patient will follow-up with me after the ultrasound.    2. Acute deep vein thrombosis (DVT) of right lower extremity, unspecified vein (HCC) No DVT or evidence of a previous DVT noted.  No medication changes necessary.   Current Outpatient Medications on File Prior to Visit  Medication Sig Dispense Refill   atorvastatin (LIPITOR) 20 MG tablet TAKE 1 TABLET BY MOUTH  DAILY 90 tablet 3   hydrochlorothiazide (HYDRODIURIL) 25 MG tablet TAKE 1 TABLET BY MOUTH  DAILY 90 tablet 3   lisinopril (ZESTRIL) 20 MG tablet TAKE 1 TABLET BY MOUTH  DAILY 90 tablet 3   metoprolol tartrate (LOPRESSOR) 50 MG tablet TAKE 1 TABLET BY MOUTH  DAILY 90 tablet 3   No current facility-administered medications on file prior to visit.    There are no Patient Instructions on file for this visit. No follow-ups on file.   Kris Hartmann, NP

## 2020-12-27 ENCOUNTER — Ambulatory Visit: Payer: Medicare Other | Admitting: Family Medicine

## 2020-12-29 NOTE — Progress Notes (Signed)
Subjective:    Patient ID: Joshua Schmidt, male    DOB: 1951/02/18, 70 y.o.   MRN: [id number] Chief Complaint  Patient presents with   Follow-up    Pt conv ble ven reflux     Joshua Schmidt is a 70 year old male that presents today for evaluation of right lower extremity leg swelling.  The patient notes that he had a heart attack about a year and a half ago and ever since then he would have periodic ankle swelling that would only last a day or 2.  However he noticed that about 3 weeks ago his right leg swelled greatly from the knee down.  He notes that he had not been doing anything different such as taking a long trip or standing for a long period when this happened.  The patient is and the swelling lasted for approximately 7 to 10 days and then resolved.  There was concern by his primary care physician that he may have developed a DVT.  He denies any redness or pain in the area.  He denies any fevers or chills.  He denies any chest pain shortness of breath.  Previous study showed no evidence of DVT  Today noninvasive studies show evidence of superficial venous reflux in the great saphenous vein at the mid thigh extending to the knee.  No evidence of DVT or deep venous insufficiency noted.  No evidence of superficial thrombophlebitis noted.   Review of Systems  Cardiovascular:  Positive for leg swelling.  All other systems reviewed and are negative.     Objective:   Physical Exam Vitals reviewed.  HENT:     Head: Normocephalic.  Cardiovascular:     Rate and Rhythm: Normal rate.     Pulses: Normal pulses.  Pulmonary:     Effort: Pulmonary effort is normal.  Skin:    General: Skin is warm and dry.  Neurological:     Mental Status: He is alert and oriented to person, place, and time.  Psychiatric:        Mood and Affect: Mood normal.        Behavior: Behavior normal.        Thought Content: Thought content normal.        Judgment: Judgment normal.    BP (!) 161/87   Pulse 66    Ht '6\' 3"'$  (1.905 m)   Wt 200 lb (90.7 kg)   BMI 25.00 kg/m   Past Medical History:  Diagnosis Date   Erythrocytosis 12/01/2018   Hypertension    Myocardial infarction [facility])     Social History   Socioeconomic History   Marital status: Married    Spouse name: Not on file   Number of children: Not on file   Years of education: Not on file   Highest education level: Not on file  Occupational History   Not on file  Tobacco Use   Smoking status: Every Day    Packs/day: 1.50    Years: 51.00    Pack years: 76.50    Types: Cigarettes   Smokeless tobacco: Never   Tobacco comments:    1ppd currently  Substance and Sexual Activity   Alcohol use: No    Alcohol/week: 0.0 standard drinks   Drug use: No   Sexual activity: Not on file  Other Topics Concern   Not on file  Social History Narrative   Not on file   Social Determinants of Health   Financial Resource Strain: Not on file  Food Insecurity: Not on file  Transportation Needs: Not on file  Physical Activity: Not on file  Stress: Not on file  Social Connections: Not on file  Intimate Partner Violence: Not on file    Past Surgical History:  Procedure Laterality Date   APPENDECTOMY     COLONOSCOPY WITH PROPOFOL N/A 11/20/2014   Procedure: COLONOSCOPY WITH PROPOFOL;  Surgeon: Christene Lye, MD;  Location: [facility] ENDOSCOPY;  Service: Endoscopy;  Laterality: N/A;   TONSILLECTOMY AND ADENOIDECTOMY      History reviewed. No pertinent family history.  No Known Allergies  CBC Latest Ref Rng & Units 12/11/2020 10/30/2020 09/18/2020  WBC 4.0 - 10.5 K/uL 7.4 - -  Hemoglobin 13.0 - 17.0 g/dL 15.6 16.8 16.9  Hematocrit 39.0 - 52.0 % 47.8 51.0 50.2  Platelets 150 - 400 K/uL 220 - -      CMP     Component Value Date/Time   NA 133 (L) 08/07/2020 1328   K 4.2 08/07/2020 1328   CL 95 (L) 08/07/2020 1328   CO2 29 08/07/2020 1328   GLUCOSE 117 (H) 08/07/2020 1328   BUN 14 08/07/2020 1328   CREATININE 0.74 08/07/2020  1328   CALCIUM 8.7 (L) 08/07/2020 1328   PROT 7.1 08/07/2020 1328   ALBUMIN 3.7 08/07/2020 1328   AST 18 08/07/2020 1328   ALT 16 08/07/2020 1328   ALKPHOS 54 08/07/2020 1328   BILITOT 0.8 08/07/2020 1328   GFRNONAA >60 08/07/2020 1328   GFRAA >60 11/30/2018 1558     No results found.     Assessment & Plan:   1. Varicose veins of right lower extremity with pain  Recommend:  The patient has large symptomatic varicose veins that are painful and associated with swelling.  I have had a long discussion with the patient regarding  varicose veins and why they cause symptoms.  Patient will begin wearing graduated compression stockings class 1 on a daily basis, beginning first thing in the morning and removing them in the evening. The patient is instructed specifically not to sleep in the stockings.    The patient  will also begin using over-the-counter analgesics such as Motrin 600 mg po TID to help control the symptoms.    In addition, behavioral modification including elevation during the day will be initiated.    Pending the results of these changes the  patient will be reevaluated in three months.   Further plans will be based on the ultrasound results and whether conservative therapies are successful at eliminating the pain and swelling.     Current Outpatient Medications on File Prior to Visit  Medication Sig Dispense Refill   atorvastatin (LIPITOR) 20 MG tablet TAKE 1 TABLET BY MOUTH  DAILY 90 tablet 3   hydrochlorothiazide (HYDRODIURIL) 25 MG tablet TAKE 1 TABLET BY MOUTH  DAILY 90 tablet 3   lisinopril (ZESTRIL) 20 MG tablet TAKE 1 TABLET BY MOUTH  DAILY 90 tablet 3   metoprolol tartrate (LOPRESSOR) 50 MG tablet TAKE 1 TABLET BY MOUTH  DAILY 90 tablet 3   No current facility-administered medications on file prior to visit.    There are no Patient Instructions on file for this visit. No follow-ups on file.   Kris Hartmann, NP

## 2020-12-30 ENCOUNTER — Ambulatory Visit: Payer: Medicare Other | Admitting: Internal Medicine

## 2021-01-22 ENCOUNTER — Inpatient Hospital Stay: Payer: Medicare Other | Attending: Oncology

## 2021-01-22 ENCOUNTER — Inpatient Hospital Stay: Payer: Medicare Other

## 2021-01-22 ENCOUNTER — Other Ambulatory Visit: Payer: Self-pay

## 2021-01-22 VITALS — BP 140/82 | HR 63 | Temp 98.4°F | Resp 18

## 2021-01-22 DIAGNOSIS — D751 Secondary polycythemia: Secondary | ICD-10-CM | POA: Diagnosis not present

## 2021-01-22 DIAGNOSIS — F17210 Nicotine dependence, cigarettes, uncomplicated: Secondary | ICD-10-CM | POA: Diagnosis not present

## 2021-01-22 LAB — HEMOGLOBIN AND HEMATOCRIT, BLOOD
HCT: 50.2 % (ref 39.0–52.0)
Hemoglobin: 16 g/dL (ref 13.0–17.0)

## 2021-01-22 NOTE — Progress Notes (Signed)
500 ml blood removed per phlebotomy orders. Patient tolerated procedure well. Declined a snack. VSS. Discharged to home.

## 2021-01-22 NOTE — Patient Instructions (Signed)

## 2021-01-27 ENCOUNTER — Other Ambulatory Visit: Payer: Self-pay | Admitting: *Deleted

## 2021-01-27 DIAGNOSIS — Z87891 Personal history of nicotine dependence: Secondary | ICD-10-CM

## 2021-01-27 DIAGNOSIS — F17210 Nicotine dependence, cigarettes, uncomplicated: Secondary | ICD-10-CM

## 2021-02-03 ENCOUNTER — Other Ambulatory Visit: Payer: Self-pay

## 2021-02-03 ENCOUNTER — Ambulatory Visit
Admission: RE | Admit: 2021-02-03 | Discharge: 2021-02-03 | Disposition: A | Discharge status: Home / Self Care | Payer: Medicare Other | Source: Ambulatory Visit | Attending: Acute Care | Admitting: Acute Care

## 2021-02-03 DIAGNOSIS — Z87891 Personal history of nicotine dependence: Secondary | ICD-10-CM | POA: Diagnosis present

## 2021-02-03 DIAGNOSIS — F17210 Nicotine dependence, cigarettes, uncomplicated: Secondary | ICD-10-CM

## 2021-02-10 ENCOUNTER — Other Ambulatory Visit: Payer: Self-pay

## 2021-02-10 ENCOUNTER — Ambulatory Visit (INDEPENDENT_AMBULATORY_CARE_PROVIDER_SITE_OTHER): Payer: Medicare Other

## 2021-02-10 DIAGNOSIS — Z23 Encounter for immunization: Secondary | ICD-10-CM | POA: Diagnosis not present

## 2021-02-11 ENCOUNTER — Encounter: Payer: Self-pay | Admitting: Internal Medicine

## 2021-02-11 ENCOUNTER — Ambulatory Visit (INDEPENDENT_AMBULATORY_CARE_PROVIDER_SITE_OTHER): Payer: Medicare Other | Admitting: Internal Medicine

## 2021-02-11 VITALS — BP 143/90 | HR 80 | Ht 75.0 in | Wt 199.8 lb

## 2021-02-11 DIAGNOSIS — Z720 Tobacco use: Secondary | ICD-10-CM | POA: Diagnosis not present

## 2021-02-11 DIAGNOSIS — F1010 Alcohol abuse, uncomplicated: Secondary | ICD-10-CM

## 2021-02-11 DIAGNOSIS — R0989 Other specified symptoms and signs involving the circulatory and respiratory systems: Secondary | ICD-10-CM | POA: Diagnosis not present

## 2021-02-11 DIAGNOSIS — D751 Secondary polycythemia: Secondary | ICD-10-CM | POA: Diagnosis not present

## 2021-02-11 NOTE — Assessment & Plan Note (Signed)
Chronic problem stable at the present time

## 2021-02-11 NOTE — Assessment & Plan Note (Signed)

## 2021-02-11 NOTE — Assessment & Plan Note (Signed)
No history of TIA patient has a plaque in left carotid artery, no obvious bruit was heard in the left side

## 2021-02-11 NOTE — Progress Notes (Signed)
Established Patient Office Visit  Subjective:  Patient ID: Joshua Schmidt, male    DOB: 1950/12/16  Age: 70 y.o. MRN: [id number]  CC:  Chief Complaint  Patient presents with   Radiology Results    HPI  Joshua Schmidt presents for we will discuss the report of the CT scan of the chest Past Medical History:  Diagnosis Date   Erythrocytosis 12/01/2018   Hypertension    Myocardial infarction [facility])     Past Surgical History:  Procedure Laterality Date   APPENDECTOMY     COLONOSCOPY WITH PROPOFOL N/A 11/20/2014   Procedure: COLONOSCOPY WITH PROPOFOL;  Surgeon: Christene Lye, MD;  Location: [facility] ENDOSCOPY;  Service: Endoscopy;  Laterality: N/A;   TONSILLECTOMY AND ADENOIDECTOMY      No family history on file.  Social History   Socioeconomic History   Marital status: Married    Spouse name: Not on file   Number of children: Not on file   Years of education: Not on file   Highest education level: Not on file  Occupational History   Not on file  Tobacco Use   Smoking status: Every Day    Packs/day: 1.50    Years: 51.00    Pack years: 76.50    Types: Cigarettes   Smokeless tobacco: Never   Tobacco comments:    1ppd currently  Substance and Sexual Activity   Alcohol use: No    Alcohol/week: 0.0 standard drinks   Drug use: No   Sexual activity: Not on file  Other Topics Concern   Not on file  Social History Narrative   Not on file   Social Determinants of Health   Financial Resource Strain: Not on file  Food Insecurity: Not on file  Transportation Needs: Not on file  Physical Activity: Not on file  Stress: Not on file  Social Connections: Not on file  Intimate Partner Violence: Not on file     Current Outpatient Medications:    atorvastatin (LIPITOR) 20 MG tablet, TAKE 1 TABLET BY MOUTH  DAILY, Disp: 90 tablet, Rfl: 3   hydrochlorothiazide (HYDRODIURIL) 25 MG tablet, TAKE 1 TABLET BY MOUTH  DAILY, Disp: 90 tablet, Rfl: 3   lisinopril (ZESTRIL) 20  MG tablet, TAKE 1 TABLET BY MOUTH  DAILY, Disp: 90 tablet, Rfl: 3   metoprolol tartrate (LOPRESSOR) 50 MG tablet, TAKE 1 TABLET BY MOUTH  DAILY, Disp: 90 tablet, Rfl: 3   No Known Allergies  ROS Review of Systems  Constitutional: Negative.   HENT: Negative.    Eyes: Negative.   Respiratory: Negative.    Cardiovascular: Negative.   Gastrointestinal: Negative.   Endocrine: Negative.   Genitourinary: Negative.   Musculoskeletal: Negative.   Skin: Negative.   Allergic/Immunologic: Negative.   Neurological: Negative.   Hematological: Negative.   Psychiatric/Behavioral: Negative.    All other systems reviewed and are negative.    Objective:    Physical Exam Vitals reviewed.  Constitutional:      Appearance: Normal appearance.  HENT:     Mouth/Throat:     Mouth: Mucous membranes are moist.  Eyes:     Pupils: Pupils are equal, round, and reactive to light.  Neck:     Vascular: No carotid bruit.  Cardiovascular:     Rate and Rhythm: Normal rate and regular rhythm.     Pulses: Normal pulses.     Heart sounds: Normal heart sounds.  Pulmonary:     Effort: Pulmonary effort is normal.  Breath sounds: Normal breath sounds.  Abdominal:     General: Bowel sounds are normal.     Palpations: Abdomen is soft. There is no hepatomegaly, splenomegaly or mass.     Tenderness: There is no abdominal tenderness.     Hernia: No hernia is present.  Musculoskeletal:     Cervical back: Neck supple.     Right lower leg: No edema.     Left lower leg: No edema.  Skin:    Findings: No rash.  Neurological:     Mental Status: He is alert and oriented to person, place, and time.     Motor: No weakness.  Psychiatric:        Mood and Affect: Mood normal.        Behavior: Behavior normal.    BP (!) 143/90   Pulse 80   Ht 6\' 3"  (1.905 m)   Wt 199 lb 12.8 oz (90.6 kg)   BMI 24.97 kg/m  Wt Readings from Last 3 Encounters:  02/11/21 199 lb 12.8 oz (90.6 kg)  02/03/21 207 lb (93.9 kg)   12/20/20 200 lb (90.7 kg)     Health Maintenance Due  Topic Date Due   Hepatitis C Screening  Never done   Zoster Vaccines- Shingrix (2 of 2) 07/05/2014    There are no preventive care reminders to display for this patient.  No results found for: TSH Lab Results  Component Value Date   WBC 7.4 12/11/2020   HGB 16.0 01/22/2021   HCT 50.2 01/22/2021   MCV 91.7 12/11/2020   PLT 220 12/11/2020   Lab Results  Component Value Date   NA 133 (L) 08/07/2020   K 4.2 08/07/2020   CO2 29 08/07/2020   GLUCOSE 117 (H) 08/07/2020   BUN 14 08/07/2020   CREATININE 0.74 08/07/2020   BILITOT 0.8 08/07/2020   ALKPHOS 54 08/07/2020   AST 18 08/07/2020   ALT 16 08/07/2020   PROT 7.1 08/07/2020   ALBUMIN 3.7 08/07/2020   CALCIUM 8.7 (L) 08/07/2020   ANIONGAP 9 08/07/2020   No results found for: CHOL No results found for: HDL No results found for: LDLCALC No results found for: TRIG No results found for: CHOLHDL No results found for: HGBA1C    Assessment & Plan:  CT scan shows emphysema and right pulmonary nodule which need to be followed up with a repeat CT scan.  Patient has COPD with emphysema noted on the CT scan   COPD is a general term that can be used to describe many different lung problems that cause lung inflammation and limit airflow. This includes chronic bronchitis and emphysema.  Exercise and physical activity improve your shortness of breath by increasing blood flow (circulation). This causes your heart to provide more oxygen to your body.    Problem List Items Addressed This Visit       Other   Tobacco abuse - Primary    Counseled patient on the dangers of tobacco use, advised patient to stop smoking, and reviewed strategies to maximize success Smoking cessation instruction/counseling given:  counseled patient on the dangers of tobacco use, advised patient to stop smoking, and reviewed strategies to maximize success It is very important that pt quit smoking. There  are various alternatives available to help with this difficult task, but first and foremost, pt must make a firm commitment and decision to quit. The nature of nicotine addiction is discussed. The usefulness of behavioral therapy is discussed and suggested.  The correct  use, cost and side effects of nicotine replacement therapy such as gum or patches is discussed. Bupropion and its cost (sometimes not covered fully by insurance) and side effects are reviewed. The quit rates are discussed. I recommend pt not allow potential costs of treatment to deter ptfrom using nicotine replacement therapy or bupropion, as the long term economic and health benefits are obvious.       Erythrocytosis    Chronic problem stable at the present time      Bruit of left carotid artery    No history of TIA patient has a plaque in left carotid artery, no obvious bruit was heard in the left side      Alcohol abuse    Suggested only to drink 5 beers a week       No orders of the defined types were placed in this encounter.   Follow-up: No follow-ups on file.    Cletis Athens, MD

## 2021-02-11 NOTE — Assessment & Plan Note (Signed)
Suggested only to drink 5 beers a week

## 2021-02-12 ENCOUNTER — Other Ambulatory Visit: Payer: Self-pay | Admitting: Acute Care

## 2021-02-12 DIAGNOSIS — Z87891 Personal history of nicotine dependence: Secondary | ICD-10-CM

## 2021-02-12 DIAGNOSIS — F17210 Nicotine dependence, cigarettes, uncomplicated: Secondary | ICD-10-CM

## 2021-03-05 ENCOUNTER — Inpatient Hospital Stay: Payer: Medicare Other

## 2021-03-05 ENCOUNTER — Other Ambulatory Visit: Payer: Self-pay

## 2021-03-05 ENCOUNTER — Inpatient Hospital Stay: Payer: Medicare Other | Attending: Oncology

## 2021-03-05 DIAGNOSIS — F17210 Nicotine dependence, cigarettes, uncomplicated: Secondary | ICD-10-CM | POA: Insufficient documentation

## 2021-03-05 DIAGNOSIS — D751 Secondary polycythemia: Secondary | ICD-10-CM | POA: Insufficient documentation

## 2021-03-05 LAB — HEMOGLOBIN AND HEMATOCRIT, BLOOD
HCT: 49 % (ref 39.0–52.0)
Hemoglobin: 16 g/dL (ref 13.0–17.0)

## 2021-03-05 NOTE — Progress Notes (Signed)
HCT 49 today. No phlebotomy required. Pt made aware.

## 2021-03-20 ENCOUNTER — Ambulatory Visit (INDEPENDENT_AMBULATORY_CARE_PROVIDER_SITE_OTHER): Payer: Medicare Other | Admitting: Vascular Surgery

## 2021-04-03 ENCOUNTER — Ambulatory Visit (INDEPENDENT_AMBULATORY_CARE_PROVIDER_SITE_OTHER): Payer: Medicare Other | Admitting: Vascular Surgery

## 2021-04-10 ENCOUNTER — Ambulatory Visit (INDEPENDENT_AMBULATORY_CARE_PROVIDER_SITE_OTHER): Payer: Medicare Other | Admitting: Nurse Practitioner

## 2021-04-10 ENCOUNTER — Encounter (INDEPENDENT_AMBULATORY_CARE_PROVIDER_SITE_OTHER): Payer: Self-pay | Admitting: Nurse Practitioner

## 2021-04-10 ENCOUNTER — Ambulatory Visit (INDEPENDENT_AMBULATORY_CARE_PROVIDER_SITE_OTHER): Payer: Medicare Other | Admitting: Vascular Surgery

## 2021-04-10 VITALS — BP 165/88 | HR 64 | Resp 16 | Wt 199.0 lb

## 2021-04-10 DIAGNOSIS — I83811 Varicose veins of right lower extremities with pain: Secondary | ICD-10-CM | POA: Diagnosis not present

## 2021-04-15 ENCOUNTER — Encounter: Payer: Self-pay | Admitting: Internal Medicine

## 2021-04-15 ENCOUNTER — Other Ambulatory Visit: Payer: Self-pay

## 2021-04-15 ENCOUNTER — Ambulatory Visit (INDEPENDENT_AMBULATORY_CARE_PROVIDER_SITE_OTHER): Payer: Medicare Other | Admitting: Internal Medicine

## 2021-04-15 VITALS — BP 160/97 | HR 60 | Ht 75.0 in | Wt 198.4 lb

## 2021-04-15 DIAGNOSIS — Z720 Tobacco use: Secondary | ICD-10-CM | POA: Diagnosis not present

## 2021-04-15 DIAGNOSIS — R0989 Other specified symptoms and signs involving the circulatory and respiratory systems: Secondary | ICD-10-CM | POA: Diagnosis not present

## 2021-04-15 DIAGNOSIS — F1010 Alcohol abuse, uncomplicated: Secondary | ICD-10-CM | POA: Diagnosis not present

## 2021-04-15 DIAGNOSIS — D751 Secondary polycythemia: Secondary | ICD-10-CM | POA: Diagnosis not present

## 2021-04-15 NOTE — Assessment & Plan Note (Signed)
[employer] recommended he can only drink up to 6 beers a  wk

## 2021-04-15 NOTE — Assessment & Plan Note (Signed)
Stable

## 2021-04-15 NOTE — Assessment & Plan Note (Signed)
Stable at the present time patient was advised to stop drinking

## 2021-04-15 NOTE — Assessment & Plan Note (Signed)
-   I instructed the patient to stop smoking and provided them with smoking cessation materials.  - I informed the patient that smoking puts them at increased risk for cancer, COPD, hypertension, and more.  - Informed the patient to seek help if they begin to have trouble breathing, develop chest pain, start to cough up blood, feel faint, or pass out.  

## 2021-04-15 NOTE — Patient Instructions (Signed)

## 2021-04-15 NOTE — Progress Notes (Signed)
Established Patient Office Visit  Subjective:  Patient ID: Joshua Schmidt, male    DOB: [birth year]  Age: 71 y.o. MRN: [id number]  CC:  Chief Complaint  Patient presents with   Follow-up   Hypertension    Patient states that his bp was elevated last Thursday as well as today in office     Hypertension   Joshua Schmidt presents for hbp check  Past Medical History:  Diagnosis Date   Erythrocytosis 12/01/2018   Hypertension    Myocardial infarction [facility])     Past Surgical History:  Procedure Laterality Date   APPENDECTOMY     COLONOSCOPY WITH PROPOFOL N/A 11/20/2014   Procedure: COLONOSCOPY WITH PROPOFOL;  Surgeon: Christene Lye, MD;  Location: [facility] ENDOSCOPY;  Service: Endoscopy;  Laterality: N/A;   TONSILLECTOMY AND ADENOIDECTOMY      History reviewed. No pertinent family history.  Social History   Socioeconomic History   Marital status: Married    Spouse name: Not on file   Number of children: Not on file   Years of education: Not on file   Highest education level: Not on file  Occupational History   Not on file  Tobacco Use   Smoking status: Every Day    Packs/day: 1.50    Years: 51.00    Pack years: 76.50    Types: Cigarettes   Smokeless tobacco: Never   Tobacco comments:    1ppd currently  Substance and Sexual Activity   Alcohol use: No    Alcohol/week: 0.0 standard drinks   Drug use: No   Sexual activity: Not on file  Other Topics Concern   Not on file  Social History Narrative   Not on file   Social Determinants of Health   Financial Resource Strain: Not on file  Food Insecurity: Not on file  Transportation Needs: Not on file  Physical Activity: Not on file  Stress: Not on file  Social Connections: Not on file  Intimate Partner Violence: Not on file     Current Outpatient Medications:    atorvastatin (LIPITOR) 20 MG tablet, TAKE 1 TABLET BY MOUTH  DAILY, Disp: 90 tablet, Rfl: 3   hydrochlorothiazide (HYDRODIURIL) 25 MG tablet,  TAKE 1 TABLET BY MOUTH  DAILY, Disp: 90 tablet, Rfl: 3   lisinopril (ZESTRIL) 20 MG tablet, TAKE 1 TABLET BY MOUTH  DAILY, Disp: 90 tablet, Rfl: 3   metoprolol tartrate (LOPRESSOR) 50 MG tablet, TAKE 1 TABLET BY MOUTH  DAILY, Disp: 90 tablet, Rfl: 3   No Known Allergies  ROS Review of Systems  Constitutional: Negative.   HENT: Negative.    Eyes: Negative.   Respiratory: Negative.    Cardiovascular: Negative.   Gastrointestinal: Negative.   Endocrine: Negative.   Genitourinary: Negative.   Musculoskeletal: Negative.   Skin: Negative.   Allergic/Immunologic: Negative.   Neurological: Negative.   Hematological: Negative.   Psychiatric/Behavioral: Negative.    All other systems reviewed and are negative.    Objective:    Physical Exam Vitals reviewed.  Constitutional:      Appearance: Normal appearance.  HENT:     Mouth/Throat:     Mouth: Mucous membranes are moist.  Eyes:     Pupils: Pupils are equal, round, and reactive to light.  Neck:     Vascular: No carotid bruit.  Cardiovascular:     Rate and Rhythm: Normal rate and regular rhythm.     Pulses: Normal pulses.     Heart sounds: Normal heart  sounds.  Pulmonary:     Effort: Pulmonary effort is normal.     Breath sounds: Normal breath sounds.  Abdominal:     General: Bowel sounds are normal.     Palpations: Abdomen is soft. There is no hepatomegaly, splenomegaly or mass.     Tenderness: There is no abdominal tenderness.     Hernia: No hernia is present.  Musculoskeletal:     Cervical back: Neck supple.     Right lower leg: No edema.     Left lower leg: No edema.  Skin:    Findings: No rash.  Neurological:     Mental Status: He is alert and oriented to person, place, and time.     Motor: No weakness.  Psychiatric:        Mood and Affect: Mood normal.        Behavior: Behavior normal.    BP (!) 160/97    Pulse 60    Ht 6\' 3"  (1.905 m)    Wt 198 lb 6.4 oz (90 kg)    BMI 24.80 kg/m  Wt Readings from Last 3  Encounters:  04/15/21 198 lb 6.4 oz (90 kg)  04/10/21 199 lb (90.3 kg)  02/11/21 199 lb 12.8 oz (90.6 kg)     Health Maintenance Due  Topic Date Due   Hepatitis C Screening  Never done   Zoster Vaccines- Shingrix (2 of 2) 07/05/2014    There are no preventive care reminders to display for this patient.  No results found for: TSH Lab Results  Component Value Date   WBC 7.4 12/11/2020   HGB 16.0 03/05/2021   HCT 49.0 03/05/2021   MCV 91.7 12/11/2020   PLT 220 12/11/2020   Lab Results  Component Value Date   NA 133 (L) 08/07/2020   K 4.2 08/07/2020   CO2 29 08/07/2020   GLUCOSE 117 (H) 08/07/2020   BUN 14 08/07/2020   CREATININE 0.74 08/07/2020   BILITOT 0.8 08/07/2020   ALKPHOS 54 08/07/2020   AST 18 08/07/2020   ALT 16 08/07/2020   PROT 7.1 08/07/2020   ALBUMIN 3.7 08/07/2020   CALCIUM 8.7 (L) 08/07/2020   ANIONGAP 9 08/07/2020   No results found for: CHOL No results found for: HDL No results found for: LDLCALC No results found for: TRIG No results found for: CHOLHDL No results found for: HGBA1C    Assessment & Plan:   Problem List Items Addressed This Visit       Other   Tobacco abuse - Primary    - I instructed the patient to stop smoking and provided them with smoking cessation materials.  - I informed the patient that smoking puts them at increased risk for cancer, COPD, hypertension, and more.  - Informed the patient to seek help if they begin to have trouble breathing, develop chest pain, start to cough up blood, feel faint, or pass out.      Erythrocytosis    Stable at the present time patient was advised to stop drinking      Bruit of left carotid artery    Stable      Alcohol abuse    [employer] recommended he can only drink up to 6 beers a  wk       No orders of the defined types were placed in this encounter.   Follow-up: No follow-ups on file.    Cletis Athens, MD

## 2021-04-16 ENCOUNTER — Encounter: Payer: Self-pay | Admitting: Oncology

## 2021-04-20 ENCOUNTER — Encounter (INDEPENDENT_AMBULATORY_CARE_PROVIDER_SITE_OTHER): Payer: Self-pay | Admitting: Nurse Practitioner

## 2021-04-20 NOTE — Progress Notes (Addendum)
Subjective:    Patient ID: Joshua Schmidt, male    DOB: 09-Feb-1951, 71 y.o.   MRN: [id number] Chief Complaint  Patient presents with   Follow-up    3 month follow up     Joshua Schmidt is a 71 year old male that presents today for evaluation of right lower extremity leg swelling.  The patient notes that he had a Schmidt attack about a year and a half ago and ever since then he would have periodic ankle swelling that would only last a day or 2.  However he noticed that about 3 weeks ago his right leg swelled greatly from the knee down.  He notes that he had not been doing anything different such as taking a long trip or standing for a long period when this happened.  The patient is and the swelling lasted for approximately 7 to 10 days and then resolved.  There was concern by his primary care physician that he may have developed a DVT.   Previous study showed no evidence of DVT.  Since the patient's most recent follow-up visit he has been engaging in conservative therapy and has noticed that the swelling is improved but not eliminated. Previous, noninvasive studies show evidence of superficial venous reflux in the great saphenous vein at the mid thigh extending to the knee.  No evidence of DVT or deep venous insufficiency noted.  No evidence of superficial thrombophlebitis noted.   Review of Systems  Cardiovascular:  Negative for leg swelling.  All other systems reviewed and are negative.     Objective:   Physical Exam Vitals reviewed.  HENT:     Head: Normocephalic.  Cardiovascular:     Rate and Rhythm: Normal rate.     Pulses: Normal pulses.  Pulmonary:     Effort: Pulmonary effort is normal.  Musculoskeletal:     Right lower leg: No edema.  Skin:    General: Skin is warm and dry.  Neurological:     Mental Status: He is alert and oriented to person, place, and time.  Psychiatric:        Mood and Affect: Mood normal.        Behavior: Behavior normal.        Thought Content:  Thought content normal.        Judgment: Judgment normal.    BP (!) 165/88 (BP Location: Right Arm)    Pulse 64    Resp 16    Wt 199 lb (90.3 kg)    BMI 24.87 kg/m   Past Medical History:  Diagnosis Date   Erythrocytosis 12/01/2018   Hypertension    Myocardial infarction [facility])     Social History   Socioeconomic History   Marital status: Married    Spouse name: Not on file   Number of children: Not on file   Years of education: Not on file   Highest education level: Not on file  Occupational History   Not on file  Tobacco Use   Smoking status: Every Day    Packs/day: 1.50    Years: 51.00    Pack years: 76.50    Types: Cigarettes   Smokeless tobacco: Never   Tobacco comments:    1ppd currently  Substance and Sexual Activity   Alcohol use: No    Alcohol/week: 0.0 standard drinks   Drug use: No   Sexual activity: Not on file  Other Topics Concern   Not on file  Social History Narrative   Not on  file   Social Determinants of Health   Financial Resource Strain: Not on file  Food Insecurity: Not on file  Transportation Needs: Not on file  Physical Activity: Not on file  Stress: Not on file  Social Connections: Not on file  Intimate Partner Violence: Not on file    Past Surgical History:  Procedure Laterality Date   APPENDECTOMY     COLONOSCOPY WITH PROPOFOL N/A 11/20/2014   Procedure: COLONOSCOPY WITH PROPOFOL;  Surgeon: Christene Lye, MD;  Location: [facility] ENDOSCOPY;  Service: Endoscopy;  Laterality: N/A;   TONSILLECTOMY AND ADENOIDECTOMY      History reviewed. No pertinent family history.  No Known Allergies  CBC Latest Ref Rng & Units 03/05/2021 01/22/2021 12/11/2020  WBC 4.0 - 10.5 K/uL - - 7.4  Hemoglobin 13.0 - 17.0 g/dL 16.0 16.0 15.6  Hematocrit 39.0 - 52.0 % 49.0 50.2 47.8  Platelets 150 - 400 K/uL - - 220      CMP     Component Value Date/Time   NA 133 (L) 08/07/2020 1328   K 4.2 08/07/2020 1328   CL 95 (L) 08/07/2020 1328   CO2 29  08/07/2020 1328   GLUCOSE 117 (H) 08/07/2020 1328   BUN 14 08/07/2020 1328   CREATININE 0.74 08/07/2020 1328   CALCIUM 8.7 (L) 08/07/2020 1328   PROT 7.1 08/07/2020 1328   ALBUMIN 3.7 08/07/2020 1328   AST 18 08/07/2020 1328   ALT 16 08/07/2020 1328   ALKPHOS 54 08/07/2020 1328   BILITOT 0.8 08/07/2020 1328   GFRNONAA >60 08/07/2020 1328   GFRAA >60 11/30/2018 1558     No results found.     Assessment & Plan:   1. Varicose veins of right lower extremity with pain Recommend  I have reviewed my previous  discussion with the patient regarding  varicose veins and why they cause symptoms. Patient will continue  wearing graduated compression stockings class 1 on a daily basis, beginning first thing in the morning and removing them in the evening.    In addition, behavioral modification including elevation during the day was again discussed and this will continue.  The patient has utilized over the counter pain medications and has been exercising.  However, at this time conservative therapy has not alleviated the patient's symptoms of leg pain and swelling  Recommend: laser ablation of the right great saphenous veins to eliminate the symptoms of pain and swelling of the lower extremities caused by the severe superficial venous reflux disease.     Current Outpatient Medications on File Prior to Visit  Medication Sig Dispense Refill   atorvastatin (LIPITOR) 20 MG tablet TAKE 1 TABLET BY MOUTH  DAILY 90 tablet 3   hydrochlorothiazide (HYDRODIURIL) 25 MG tablet TAKE 1 TABLET BY MOUTH  DAILY 90 tablet 3   lisinopril (ZESTRIL) 20 MG tablet TAKE 1 TABLET BY MOUTH  DAILY 90 tablet 3   metoprolol tartrate (LOPRESSOR) 50 MG tablet TAKE 1 TABLET BY MOUTH  DAILY 90 tablet 3   No current facility-administered medications on file prior to visit.    There are no Patient Instructions on file for this visit. No follow-ups on file.   Kris Hartmann, NP

## 2021-04-23 ENCOUNTER — Other Ambulatory Visit: Payer: Medicare Other

## 2021-04-23 ENCOUNTER — Ambulatory Visit: Payer: Medicare Other | Admitting: Oncology

## 2021-04-29 ENCOUNTER — Encounter: Payer: Self-pay | Admitting: Nurse Practitioner

## 2021-04-29 ENCOUNTER — Other Ambulatory Visit: Payer: Self-pay

## 2021-04-29 ENCOUNTER — Inpatient Hospital Stay: Payer: Commercial Managed Care - HMO

## 2021-04-29 ENCOUNTER — Inpatient Hospital Stay (HOSPITAL_BASED_OUTPATIENT_CLINIC_OR_DEPARTMENT_OTHER): Payer: Commercial Managed Care - HMO | Admitting: Nurse Practitioner

## 2021-04-29 ENCOUNTER — Inpatient Hospital Stay: Payer: Commercial Managed Care - HMO | Attending: Nurse Practitioner

## 2021-04-29 VITALS — BP 140/90 | HR 64

## 2021-04-29 VITALS — BP 156/84 | HR 60 | Resp 18 | Wt 200.0 lb

## 2021-04-29 DIAGNOSIS — I252 Old myocardial infarction: Secondary | ICD-10-CM | POA: Diagnosis not present

## 2021-04-29 DIAGNOSIS — I10 Essential (primary) hypertension: Secondary | ICD-10-CM | POA: Insufficient documentation

## 2021-04-29 DIAGNOSIS — F17210 Nicotine dependence, cigarettes, uncomplicated: Secondary | ICD-10-CM | POA: Diagnosis not present

## 2021-04-29 DIAGNOSIS — M7989 Other specified soft tissue disorders: Secondary | ICD-10-CM | POA: Insufficient documentation

## 2021-04-29 DIAGNOSIS — D751 Secondary polycythemia: Secondary | ICD-10-CM | POA: Insufficient documentation

## 2021-04-29 DIAGNOSIS — D649 Anemia, unspecified: Secondary | ICD-10-CM

## 2021-04-29 DIAGNOSIS — G4733 Obstructive sleep apnea (adult) (pediatric): Secondary | ICD-10-CM | POA: Diagnosis not present

## 2021-04-29 DIAGNOSIS — Z79899 Other long term (current) drug therapy: Secondary | ICD-10-CM | POA: Diagnosis not present

## 2021-04-29 LAB — CBC WITH DIFFERENTIAL/PLATELET
Abs Immature Granulocytes: 0.02 10*3/uL (ref 0.00–0.07)
Basophils Absolute: 0.1 10*3/uL (ref 0.0–0.1)
Basophils Relative: 1 %
Eosinophils Absolute: 0.2 10*3/uL (ref 0.0–0.5)
Eosinophils Relative: 3 %
HCT: 52.6 % — ABNORMAL HIGH (ref 39.0–52.0)
Hemoglobin: 17.5 g/dL — ABNORMAL HIGH (ref 13.0–17.0)
Immature Granulocytes: 0 %
Lymphocytes Relative: 20 %
Lymphs Abs: 1.4 10*3/uL (ref 0.7–4.0)
MCH: 30.9 pg (ref 26.0–34.0)
MCHC: 33.3 g/dL (ref 30.0–36.0)
MCV: 92.9 fL (ref 80.0–100.0)
Monocytes Absolute: 0.6 10*3/uL (ref 0.1–1.0)
Monocytes Relative: 10 %
Neutro Abs: 4.4 10*3/uL (ref 1.7–7.7)
Neutrophils Relative %: 66 %
Platelets: 223 10*3/uL (ref 150–400)
RBC: 5.66 MIL/uL (ref 4.22–5.81)
RDW: 18.8 % — ABNORMAL HIGH (ref 11.5–15.5)
WBC: 6.7 10*3/uL (ref 4.0–10.5)
nRBC: 0 % (ref 0.0–0.2)

## 2021-04-29 LAB — HEMOGLOBIN AND HEMATOCRIT, BLOOD
HCT: 51.8 % (ref 39.0–52.0)
Hemoglobin: 17.3 g/dL — ABNORMAL HIGH (ref 13.0–17.0)

## 2021-04-29 NOTE — Progress Notes (Signed)
Hematology/Oncology  Follow up note [facility] [phone] [phone]   Patient Care Team: Cletis Athens, MD as PCP - General (Internal Medicine)  REFERRING PROVIDER: Cletis Athens, MD   CHIEF COMPLAINTS/REASON FOR VISIT:  Follow up for erythrocytosis.  HISTORY OF PRESENTING ILLNESS:  Joshua Schmidt is a 71 y.o. male who was seen in consultation at the request of Cletis Athens, MD for evaluation of polycytosis/erythrocytosis Patient had lab work done with primary care provider and was found to have high hemoglobin.  Lab results are not available to me.  Patient was referred to heme-onc for further evaluation and discussion. Associated signs or symptoms: Denies weight loss, fever, chills, fatigue, night sweats.   Context:  Smoking history: 76.5 pack year smoking history, he reports that he quitted in the past and then smoke again Testosterone supplements: Denies History of blood clots: Denies Daytime somnolence: Denies Family history of polycythemia: Denies Denies any personal history of thrombosis, stroke, heart attack.  Denies any skin itchiness.  He is a retired Clinical biochemist, remains active.  Mainly works outdoors. Sleep apnea, not interested in using CPAP machine  INTERVAL HISTORY Joshua Schmidt is a 71 year old male who returns to clinic for follow up for secondary erythrocytosis. He receives intermittent phlebotomies. Continues to smoke but is down to half a pack a day. Otherwise feels well and denies complaints.    Review of Systems  Constitutional:  Negative for appetite change, fatigue and unexpected weight change.  HENT:   Negative for mouth sores, sore throat and trouble swallowing.   Respiratory:  Negative for chest tightness and shortness of breath.   Cardiovascular:  Negative for leg swelling.  Gastrointestinal:  Negative for abdominal pain, constipation, diarrhea, nausea and vomiting.  Genitourinary:  Negative for  bladder incontinence and dysuria.   Musculoskeletal:  Negative for flank pain and neck stiffness.  Skin:  Negative for itching, rash and wound.  Neurological:  Negative for dizziness, headaches, light-headedness and numbness.  Psychiatric/Behavioral:  Negative for confusion, depression and sleep disturbance. The patient is not nervous/anxious.    MEDICAL HISTORY:  Past Medical History:  Diagnosis Date   Erythrocytosis 12/01/2018   Hypertension    Myocardial infarction [facility])     SURGICAL HISTORY: Past Surgical History:  Procedure Laterality Date   APPENDECTOMY     COLONOSCOPY WITH PROPOFOL N/A 11/20/2014   Procedure: COLONOSCOPY WITH PROPOFOL;  Surgeon: Christene Lye, MD;  Location: [facility] ENDOSCOPY;  Service: Endoscopy;  Laterality: N/A;   TONSILLECTOMY AND ADENOIDECTOMY      SOCIAL HISTORY: Social History   Socioeconomic History   Marital status: Married    Spouse name: Not on file   Number of children: Not on file   Years of education: Not on file   Highest education level: Not on file  Occupational History   Not on file  Tobacco Use   Smoking status: Every Day    Packs/day: 1.50    Years: 51.00    Pack years: 76.50    Types: Cigarettes   Smokeless tobacco: Never   Tobacco comments:    1ppd currently  Substance and Sexual Activity   Alcohol use: No    Alcohol/week: 0.0 standard drinks   Drug use: No   Sexual activity: Not on file  Other Topics Concern   Not on file  Social History Narrative   Not on file   Social Determinants of Health   Financial Resource Strain: Not on file  Food Insecurity: Not on file  Transportation Needs: Not on file  Physical Activity: Not on file  Stress: Not on file  Social Connections: Not on file  Intimate Partner Violence: Not on file    FAMILY HISTORY: History reviewed. No pertinent family history.  ALLERGIES:  has No Known Allergies.  MEDICATIONS:  Current Outpatient Medications  Medication Sig Dispense Refill    atorvastatin (LIPITOR) 20 MG tablet TAKE 1 TABLET BY MOUTH  DAILY 90 tablet 3   hydrochlorothiazide (HYDRODIURIL) 25 MG tablet TAKE 1 TABLET BY MOUTH  DAILY 90 tablet 3   lisinopril (ZESTRIL) 20 MG tablet TAKE 1 TABLET BY MOUTH  DAILY 90 tablet 3   metoprolol tartrate (LOPRESSOR) 50 MG tablet TAKE 1 TABLET BY MOUTH  DAILY 90 tablet 3   No current facility-administered medications for this visit.     PHYSICAL EXAMINATION: ECOG PERFORMANCE STATUS: 0 - Asymptomatic Vitals:   04/29/21 1305  BP: (!) 156/84  Pulse: 60  Resp: 18  SpO2: 93%   Filed Weights   04/29/21 1305  Weight: 200 lb (90.7 kg)    Physical Exam Vitals reviewed.  Constitutional:      Appearance: He is not ill-appearing.  Pulmonary:     Effort: No respiratory distress.  Skin:    Coloration: Skin is not pale.  Neurological:     Mental Status: He is alert and oriented to person, place, and time.  Psychiatric:        Mood and Affect: Mood normal.        Behavior: Behavior normal.    RADIOGRAPHIC STUDIES: I have personally reviewed the radiological images as listed and agreed with the findings in the report. No results found.   LABORATORY DATA:  I have reviewed the data as listed Lab Results  Component Value Date   WBC 7.4 12/11/2020   HGB 17.3 (H) 04/29/2021   HCT 51.8 04/29/2021   MCV 91.7 12/11/2020   PLT 220 12/11/2020   Recent Labs    08/07/20 1328  NA 133*  K 4.2  CL 95*  CO2 29  GLUCOSE 117*  BUN 14  CREATININE 0.74  CALCIUM 8.7*  GFRNONAA >60  PROT 7.1  ALBUMIN 3.7  AST 18  ALT 16  ALKPHOS 54  BILITOT 0.8   Iron/TIBC/Ferritin/ %Sat No results found for: IRON, TIBC, FERRITIN, IRONPCTSAT   ASSESSMENT & PLAN:  1. Symptomatic anemia    Secondary erythrocytosis- Due to smoking.  Last phlebotomy was on 01/22/21. Goal hematocrit is < 50. Hematocrit today 51.8. Proceed with phlebotomy.   OSA- declines cpap. Likely contributing to erythrocytosis.  Tobacco abuse- encouraged  cessation. Followed by LDCT screening program.  Right lower extremity swelling- s/p doppler  Disposition- 6 weeks- H&H, +/- phlebotomy 12 weeks- H&H, +/- phlebotomy 18 weeks- H&H, +/- phlebotomy 24 weeks- cbc, ferritin, iron studies, Dr. Tasia Catchings, +/- phlebotomy  Beckey Rutter, [place], AGNP-C [place] [facility] [phone] (clinic) 04/29/2021

## 2021-04-29 NOTE — Patient Instructions (Signed)

## 2021-04-29 NOTE — Progress Notes (Signed)
Removed 500 ml of blood per NP orders for phlebotomy today. Tolerated procedure well. VSS. Feeling well at time of discharge.

## 2021-04-29 NOTE — Addendum Note (Signed)
Addended by: Drue Dun on: 04/29/2021 01:46 PM   Modules accepted: Orders

## 2021-04-30 ENCOUNTER — Telehealth (INDEPENDENT_AMBULATORY_CARE_PROVIDER_SITE_OTHER): Payer: Self-pay | Admitting: *Deleted

## 2021-04-30 NOTE — Telephone Encounter (Signed)
Called patient to give him information on his laser procedure scheduled for 05/05/21 at 1 pm. Let him know that his pre medication was sent to the pharmacy and he will need a driver the day of. Also that the packet he needs to sign will be here at the desk since we do not have time to get it to him.Had to leave a message for him to call the office back if he has any specific questions.

## 2021-05-01 ENCOUNTER — Other Ambulatory Visit (INDEPENDENT_AMBULATORY_CARE_PROVIDER_SITE_OTHER): Payer: Self-pay | Admitting: Nurse Practitioner

## 2021-05-01 MED ORDER — ALPRAZOLAM 0.5 MG PO TABS: ORAL_TABLET | ORAL | 0 refills | Status: DC

## 2021-05-04 DIAGNOSIS — I872 Venous insufficiency (chronic) (peripheral): Secondary | ICD-10-CM | POA: Insufficient documentation

## 2021-05-04 DIAGNOSIS — I83819 Varicose veins of unspecified lower extremities with pain: Secondary | ICD-10-CM | POA: Insufficient documentation

## 2021-05-04 NOTE — Progress Notes (Signed)
° ° °  MRN : [id number]  Joshua Schmidt is a 71 y.o. (Aug 29, 1950) male who presents with chief complaint of No chief complaint on file. .    The patient's right lower extremity was sterilely prepped and draped.  The ultrasound machine was used to visualize the right great saphenous vein throughout its course.  A segment below the knee was selected for access.  The saphenous vein was accessed without difficulty using ultrasound guidance with a micropuncture needle.   An 0.018  wire was placed beyond the saphenofemoral junction through the sheath and the microneedle was removed.  The 65 cm sheath was then placed over the wire and the wire and dilator were removed.  The laser fiber was placed through the sheath and its tip was placed approximately 2 cm below the saphenofemoral junction.  Tumescent anesthesia was then created with a dilute lidocaine solution.  Laser energy was then delivered with constant withdrawal of the sheath and laser fiber.  Approximately 2123 Joules of energy were delivered over a length of 48 cm.  Sterile dressings were placed.  The patient tolerated the procedure well without complications.

## 2021-05-05 ENCOUNTER — Ambulatory Visit (INDEPENDENT_AMBULATORY_CARE_PROVIDER_SITE_OTHER): Payer: Medicare Other | Admitting: Vascular Surgery

## 2021-05-05 ENCOUNTER — Encounter (INDEPENDENT_AMBULATORY_CARE_PROVIDER_SITE_OTHER): Payer: Self-pay | Admitting: Vascular Surgery

## 2021-05-05 ENCOUNTER — Other Ambulatory Visit: Payer: Self-pay

## 2021-05-05 DIAGNOSIS — I83819 Varicose veins of unspecified lower extremities with pain: Secondary | ICD-10-CM

## 2021-05-05 DIAGNOSIS — I83811 Varicose veins of right lower extremities with pain: Secondary | ICD-10-CM | POA: Diagnosis not present

## 2021-05-05 DIAGNOSIS — I872 Venous insufficiency (chronic) (peripheral): Secondary | ICD-10-CM

## 2021-05-13 ENCOUNTER — Other Ambulatory Visit (INDEPENDENT_AMBULATORY_CARE_PROVIDER_SITE_OTHER): Payer: Self-pay | Admitting: Vascular Surgery

## 2021-05-13 DIAGNOSIS — I83811 Varicose veins of right lower extremities with pain: Secondary | ICD-10-CM

## 2021-05-14 ENCOUNTER — Other Ambulatory Visit: Payer: Self-pay

## 2021-05-14 ENCOUNTER — Ambulatory Visit (INDEPENDENT_AMBULATORY_CARE_PROVIDER_SITE_OTHER): Payer: Medicare Other

## 2021-05-14 DIAGNOSIS — I83811 Varicose veins of right lower extremities with pain: Secondary | ICD-10-CM

## 2021-06-02 ENCOUNTER — Encounter: Payer: Self-pay | Admitting: Oncology

## 2021-06-02 ENCOUNTER — Encounter (INDEPENDENT_AMBULATORY_CARE_PROVIDER_SITE_OTHER): Payer: Self-pay | Admitting: Vascular Surgery

## 2021-06-02 ENCOUNTER — Ambulatory Visit (INDEPENDENT_AMBULATORY_CARE_PROVIDER_SITE_OTHER): Payer: Medicare Other | Admitting: Vascular Surgery

## 2021-06-02 ENCOUNTER — Other Ambulatory Visit: Payer: Self-pay

## 2021-06-02 VITALS — BP 147/84 | HR 63 | Ht 75.0 in | Wt 198.0 lb

## 2021-06-02 DIAGNOSIS — I872 Venous insufficiency (chronic) (peripheral): Secondary | ICD-10-CM | POA: Diagnosis not present

## 2021-06-02 DIAGNOSIS — I83819 Varicose veins of unspecified lower extremities with pain: Secondary | ICD-10-CM | POA: Diagnosis not present

## 2021-06-02 NOTE — Progress Notes (Signed)
MRN : [id number]  Joshua Schmidt is a 71 y.o. ([birth year]) male who presents with chief complaint of follow up.  History of Present Illness:   The patient returns to the office for followup status post laser ablation of the right great saphenous vein on 05/05/2021.  The patient note significant improvement in the lower extremity pain but not resolution of the symptoms. The patient notes multiple residual varicosities bilaterally which continued to hurt with dependent positions and remained tender to palpation. The patient's swelling is minimally from preoperative status. The patient continues to wear graduated compression stockings on a daily basis but these are not eliminating the pain and discomfort. The patient continues to use over-the-counter anti-inflammatory medications to treat the pain and related symptoms but this has not given the patient relief. The patient notes the pain in the lower extremities is causing problems with daily exercise, problems at work and even with household activities such as preparing meals and doing dishes.  The patient is otherwise done well and there have been no complications related to the laser procedure or interval changes in the patient's overall   Post laser ultrasound shows successful ablation of the right great saphenous vein no evidence of DVT.  Current Meds  Medication Sig   atorvastatin (LIPITOR) 20 MG tablet TAKE 1 TABLET BY MOUTH  DAILY   hydrochlorothiazide (HYDRODIURIL) 25 MG tablet TAKE 1 TABLET BY MOUTH  DAILY   lisinopril (ZESTRIL) 20 MG tablet TAKE 1 TABLET BY MOUTH  DAILY   metoprolol tartrate (LOPRESSOR) 50 MG tablet TAKE 1 TABLET BY MOUTH  DAILY    Past Medical History:  Diagnosis Date   Erythrocytosis 12/01/2018   Hypertension    Myocardial infarction [facility])     Past Surgical History:  Procedure Laterality Date   APPENDECTOMY     COLONOSCOPY WITH PROPOFOL N/A 11/20/2014   Procedure: COLONOSCOPY WITH PROPOFOL;  Surgeon:  Christene Lye, MD;  Location: [facility] ENDOSCOPY;  Service: Endoscopy;  Laterality: N/A;   TONSILLECTOMY AND ADENOIDECTOMY      Social History Social History   Tobacco Use   Smoking status: Every Day    Packs/day: 1.50    Years: 51.00    Pack years: 76.50    Types: Cigarettes   Smokeless tobacco: Never   Tobacco comments:    1ppd currently  Substance Use Topics   Alcohol use: No    Alcohol/week: 0.0 standard drinks   Drug use: No    Family History History reviewed. No pertinent family history.  No Known Allergies   REVIEW OF SYSTEMS (Negative unless checked)  Constitutional: [] Weight loss  [] Fever  [] Chills Cardiac: [] Chest pain   [] Chest pressure   [] Palpitations   [] Shortness of breath when laying flat   [] Shortness of breath with exertion. Vascular:  [] Pain in legs with walking   [] Pain in legs at rest  [] History of DVT   [] Phlebitis   [x] Swelling in legs   [] Varicose veins   [] Non-healing ulcers Pulmonary:   [] Uses home oxygen   [] Productive cough   [] Hemoptysis   [] Wheeze  [] COPD   [] Asthma Neurologic:  [] Dizziness   [] Seizures   [] History of stroke   [] History of TIA  [] Aphasia   [] Vissual changes   [] Weakness or numbness in arm   [] Weakness or numbness in leg Musculoskeletal:   [] Joint swelling   [] Joint pain   [] Low back pain Hematologic:  [] Easy bruising  [] Easy bleeding   [] Hypercoagulable state   [] Anemic Gastrointestinal:  [] Diarrhea   []   Vomiting  [] Gastroesophageal reflux/heartburn   [] Difficulty swallowing. Genitourinary:  [] Chronic kidney disease   [] Difficult urination  [] Frequent urination   [] Blood in urine Skin:  [] Rashes   [] Ulcers  Psychological:  [] History of anxiety   []  History of major depression.  Physical Examination  Vitals:   06/02/21 1316  BP: (!) 147/84  Pulse: 63  Weight: 198 lb (89.8 kg)  Height: 6\' 3"  (1.905 m)   Body mass index is 24.75 kg/m. Gen: WD/WN, NAD Head: [place]/AT, No temporalis wasting.  Ear/Nose/Throat: Hearing  grossly intact, nares w/o erythema or drainage, pinna without lesions Eyes: PER, EOMI, sclera nonicteric.  Neck: Supple, no gross masses.  No JVD.  Pulmonary:  Good air movement, no audible wheezing, no use of accessory muscles.  Cardiac: RRR, precordium not hyperdynamic. Vascular: Multiple large persistent varicosities greater than 8 mm right lower extremity.  Moderate venous stasis changes to the legs bilaterally.  Trace soft pitting edema  Vessel Right Left  Radial Palpable Palpable  Gastrointestinal: soft, non-distended. No guarding/no peritoneal signs.  Musculoskeletal: M/S 5/5 throughout.  No deformity.  Neurologic: CN 2-12 intact. Pain and light touch intact in extremities.  Symmetrical.  Speech is fluent. Motor exam as listed above. Psychiatric: Judgment intact, Mood & affect appropriate for pt's clinical situation. Dermatologic: Venous rashes no ulcers noted.  No changes consistent with cellulitis. Lymph : No lichenification or skin changes of chronic lymphedema.  CBC Lab Results  Component Value Date   WBC 6.7 04/29/2021   HGB 17.3 (H) 04/29/2021   HGB 17.5 (H) 04/29/2021   HCT 51.8 04/29/2021   HCT 52.6 (H) 04/29/2021   MCV 92.9 04/29/2021   PLT 223 04/29/2021    BMET    Component Value Date/Time   NA 133 (L) 08/07/2020 1328   K 4.2 08/07/2020 1328   CL 95 (L) 08/07/2020 1328   CO2 29 08/07/2020 1328   GLUCOSE 117 (H) 08/07/2020 1328   BUN 14 08/07/2020 1328   CREATININE 0.74 08/07/2020 1328   CALCIUM 8.7 (L) 08/07/2020 1328   GFRNONAA >60 08/07/2020 1328   GFRAA >60 11/30/2018 1558   CrCl cannot be calculated (Patient's most recent lab result is older than the maximum 21 days allowed.).  COAG No results found for: INR, PROTIME  Radiology VAS [place] LOWER EXTREMITY VENOUS POST ABLATION  Result Date: 05/15/2021  Lower Venous Reflux Study Patient Name:  Joshua Schmidt Douglass  Date of Exam:   05/14/2021 Medical Rec #: [id number]        Accession #:    [id number] Date of  Birth: 1950-12-30        Patient Gender: M Patient Age:   46 years Exam Location:  [facility] Procedure:      VAS [place] LASER ABLATION OF SUPERFICIAL VEIN Referring Phys: Hortencia Pilar --------------------------------------------------------------------------------  Indications: S/p rt GSV ablation.  Performing Technologist: Concha Norway RVT  Examination Guidelines: A complete evaluation includes B-mode imaging, spectral Doppler, color Doppler, and power Doppler as needed of all accessible portions of each vessel. Bilateral testing is considered an integral part of a complete examination. Limited examinations for reoccurring indications may be performed as noted.  +------+---------------+---------+-----------+----------+--------------+  RIGHT  Compressibility Phasicity Spontaneity Properties Thrombus Aging  +------+---------------+---------+-----------+----------+--------------+  CFV    Full            Yes       Yes                                    +------+---------------+---------+-----------+----------+--------------+  FV Mid Full            Yes       Yes                                    +------+---------------+---------+-----------+----------+--------------+  POP    Full            Yes       Yes                                    +------+---------------+---------+-----------+----------+--------------+     Summary: Right: - There is no evidence of deep vein thrombosis in the lower extremity. However, portions of this examination were limited- see technologist comments above. Rt GSV closed s/p ablation to the SFJ  *See table(s) above for measurements and observations. Electronically signed by Hortencia Pilar MD on 05/15/2021 at 3:10:40 PM.    Final      Assessment/Plan 1. Varicose veins with pain Recommend:  The patient has had successful ablation of the previously incompetent saphenous venous system but still has persistent symptoms of pain and swelling that are having a negative impact on  daily life and daily activities.  Patient should undergo injection sclerotherapy to treat the residual varicosities.  The risks, benefits and alternative therapies were reviewed in detail with the patient.  All questions were answered.  The patient agrees to proceed with sclerotherapy at their convenience.  The patient will continue wearing the graduated compression stockings and using the over-the-counter pain medications to treat her symptoms.    2. Chronic venous insufficiency No surgery or intervention at this point in time.    I have had a long discussion with the patient regarding venous insufficiency and why it  causes symptoms. I have discussed with the patient the chronic skin changes that accompany venous insufficiency and the long term sequela such as infection and ulceration.  Patient will begin wearing graduated compression stockings class 1 (20-30 mmHg) or compression wraps on a daily basis a prescription was given. The patient will put the stockings on first thing in the morning and removing them in the evening. The patient is instructed specifically not to sleep in the stockings.    In addition, behavioral modification including several periods of elevation of the lower extremities during the day will be continued. I have demonstrated that proper elevation is a position with the ankles at heart level.  The patient is instructed to begin routine exercise, especially walking on a daily basis     Hortencia Pilar, MD  06/02/2021 1:26 PM

## 2021-06-03 ENCOUNTER — Other Ambulatory Visit: Payer: Self-pay | Admitting: *Deleted

## 2021-06-03 DIAGNOSIS — D649 Anemia, unspecified: Secondary | ICD-10-CM

## 2021-06-03 DIAGNOSIS — D751 Secondary polycythemia: Secondary | ICD-10-CM

## 2021-06-09 ENCOUNTER — Inpatient Hospital Stay: Payer: Medicare Other

## 2021-06-09 ENCOUNTER — Other Ambulatory Visit: Payer: Self-pay

## 2021-06-09 ENCOUNTER — Inpatient Hospital Stay: Payer: Medicare Other | Attending: Oncology

## 2021-06-09 VITALS — BP 141/86 | HR 66 | Temp 97.1°F | Resp 19

## 2021-06-09 DIAGNOSIS — D751 Secondary polycythemia: Secondary | ICD-10-CM | POA: Diagnosis not present

## 2021-06-09 DIAGNOSIS — F17210 Nicotine dependence, cigarettes, uncomplicated: Secondary | ICD-10-CM | POA: Insufficient documentation

## 2021-06-09 DIAGNOSIS — D649 Anemia, unspecified: Secondary | ICD-10-CM

## 2021-06-09 LAB — HEMOGLOBIN AND HEMATOCRIT, BLOOD
HCT: 50.7 % (ref 39.0–52.0)
Hemoglobin: 16.8 g/dL (ref 13.0–17.0)

## 2021-06-09 NOTE — Progress Notes (Signed)
500 ml Phlebotomy performed. Pt tolerated well. Declined a beverage. VSS. Discharged to home feeling well.

## 2021-06-09 NOTE — Patient Instructions (Signed)

## 2021-07-04 ENCOUNTER — Ambulatory Visit (INDEPENDENT_AMBULATORY_CARE_PROVIDER_SITE_OTHER): Payer: Medicare Other | Admitting: Nurse Practitioner

## 2021-07-04 ENCOUNTER — Other Ambulatory Visit: Payer: Self-pay

## 2021-07-04 ENCOUNTER — Encounter (INDEPENDENT_AMBULATORY_CARE_PROVIDER_SITE_OTHER): Payer: Self-pay | Admitting: Nurse Practitioner

## 2021-07-04 VITALS — BP 148/80 | HR 70 | Resp 16 | Wt 199.8 lb

## 2021-07-04 DIAGNOSIS — I83811 Varicose veins of right lower extremities with pain: Secondary | ICD-10-CM | POA: Diagnosis not present

## 2021-07-04 DIAGNOSIS — I83819 Varicose veins of unspecified lower extremities with pain: Secondary | ICD-10-CM | POA: Diagnosis not present

## 2021-07-07 ENCOUNTER — Other Ambulatory Visit: Payer: Self-pay | Admitting: Internal Medicine

## 2021-07-07 ENCOUNTER — Encounter (INDEPENDENT_AMBULATORY_CARE_PROVIDER_SITE_OTHER): Payer: Self-pay | Admitting: Nurse Practitioner

## 2021-07-07 NOTE — Progress Notes (Signed)
Varicose veins of right  lower extremity with inflammation (454.1  I83.10) Current Plans   Indication: Patient presents with symptomatic varicose veins of the right  lower extremity.   Procedure: Sclerotherapy using hypertonic saline mixed with 1% Lidocaine was performed on the right lower extremity. Compression wraps were placed. The patient tolerated the procedure well. 

## 2021-07-14 ENCOUNTER — Ambulatory Visit (INDEPENDENT_AMBULATORY_CARE_PROVIDER_SITE_OTHER): Payer: Medicare Other | Admitting: Internal Medicine

## 2021-07-14 ENCOUNTER — Encounter: Payer: Self-pay | Admitting: Internal Medicine

## 2021-07-14 VITALS — BP 140/83 | HR 82 | Ht 75.0 in | Wt 201.7 lb

## 2021-07-14 DIAGNOSIS — R0989 Other specified symptoms and signs involving the circulatory and respiratory systems: Secondary | ICD-10-CM | POA: Diagnosis not present

## 2021-07-14 DIAGNOSIS — I872 Venous insufficiency (chronic) (peripheral): Secondary | ICD-10-CM

## 2021-07-14 DIAGNOSIS — Z1211 Encounter for screening for malignant neoplasm of colon: Secondary | ICD-10-CM | POA: Diagnosis not present

## 2021-07-14 DIAGNOSIS — Z720 Tobacco use: Secondary | ICD-10-CM

## 2021-07-14 DIAGNOSIS — F1010 Alcohol abuse, uncomplicated: Secondary | ICD-10-CM | POA: Diagnosis not present

## 2021-07-14 NOTE — Assessment & Plan Note (Signed)
-   I instructed the patient to stop smoking and provided them with smoking cessation materials.  - I informed the patient that smoking puts them at increased risk for cancer, COPD, hypertension, and more.  - Informed the patient to seek help if they begin to have trouble breathing, develop chest pain, start to cough up blood, feel faint, or pass out.  

## 2021-07-14 NOTE — Assessment & Plan Note (Signed)
Patient has surgery on the right leg ?

## 2021-07-14 NOTE — Assessment & Plan Note (Signed)
Stable at present time ?

## 2021-07-14 NOTE — Progress Notes (Signed)
? ?Established Patient Office Visit ? ?Subjective:  ?Patient ID: Joshua Schmidt, male    DOB: 23-Dec-1950  Age: 71 y.o. MRN: [id number] ? ?CC:  ?Chief Complaint  ?Patient presents with  ? Follow-up  ?  Patient is here for 3 month follow up, no complaints today.  ? ? ?HPI ? ?Joshua Schmidt presents for general check up ? ?Past Medical History:  ?Diagnosis Date  ? Erythrocytosis 12/01/2018  ? Hypertension   ? Myocardial infarction [facility])   ? ? ?Past Surgical History:  ?Procedure Laterality Date  ? APPENDECTOMY    ? COLONOSCOPY WITH PROPOFOL N/A 11/20/2014  ? Procedure: COLONOSCOPY WITH PROPOFOL;  Surgeon: Christene Lye, MD;  Location: [facility] ENDOSCOPY;  Service: Endoscopy;  Laterality: N/A;  ? TONSILLECTOMY AND ADENOIDECTOMY    ? ? ?History reviewed. No pertinent family history. ? ?Social History  ? ?Socioeconomic History  ? Marital status: Married  ?  Spouse name: Not on file  ? Number of children: Not on file  ? Years of education: Not on file  ? Highest education level: Not on file  ?Occupational History  ? Not on file  ?Tobacco Use  ? Smoking status: Every Day  ?  Packs/day: 1.50  ?  Years: 51.00  ?  Pack years: 76.50  ?  Types: Cigarettes  ? Smokeless tobacco: Never  ? Tobacco comments:  ?  1ppd currently  ?Substance and Sexual Activity  ? Alcohol use: No  ?  Alcohol/week: 0.0 standard drinks  ? Drug use: No  ? Sexual activity: Not on file  ?Other Topics Concern  ? Not on file  ?Social History Narrative  ? Not on file  ? ?Social Determinants of Health  ? ?Financial Resource Strain: Not on file  ?Food Insecurity: Not on file  ?Transportation Needs: Not on file  ?Physical Activity: Not on file  ?Stress: Not on file  ?Social Connections: Not on file  ?Intimate Partner Violence: Not on file  ? ? ? ?Current Outpatient Medications:  ?  atorvastatin (LIPITOR) 20 MG tablet, TAKE 1 TABLET BY MOUTH  DAILY, Disp: 90 tablet, Rfl: 3 ?  hydrochlorothiazide (HYDRODIURIL) 25 MG tablet, TAKE 1 TABLET BY MOUTH  DAILY, Disp: 90  tablet, Rfl: 3 ?  lisinopril (ZESTRIL) 20 MG tablet, TAKE 1 TABLET BY MOUTH  DAILY, Disp: 90 tablet, Rfl: 3 ?  metoprolol tartrate (LOPRESSOR) 50 MG tablet, TAKE 1 TABLET BY MOUTH  DAILY, Disp: 90 tablet, Rfl: 3  ? ?No Known Allergies ? ?ROS ?Review of Systems  ?Constitutional: Negative.   ?HENT: Negative.    ?Eyes: Negative.   ?Respiratory: Negative.    ?Cardiovascular: Negative.   ?Gastrointestinal: Negative.   ?Endocrine: Negative.   ?Genitourinary: Negative.   ?Musculoskeletal: Negative.   ?Skin: Negative.   ?Allergic/Immunologic: Negative.   ?Neurological: Negative.   ?Hematological: Negative.   ?Psychiatric/Behavioral: Negative.    ?All other systems reviewed and are negative. ? ?  ?Objective:  ?  ?Physical Exam ?Vitals reviewed.  ?Constitutional:   ?   Appearance: Normal appearance.  ?HENT:  ?   Mouth/Throat:  ?   Mouth: Mucous membranes are moist.  ?Eyes:  ?   Pupils: Pupils are equal, round, and reactive to light.  ?Neck:  ?   Vascular: No carotid bruit.  ?Cardiovascular:  ?   Rate and Rhythm: Normal rate and regular rhythm.  ?   Pulses: Normal pulses.  ?   Heart sounds: Normal heart sounds.  ?Pulmonary:  ?   Effort:  Pulmonary effort is normal.  ?   Breath sounds: Normal breath sounds.  ?Abdominal:  ?   General: Bowel sounds are normal.  ?   Palpations: Abdomen is soft. There is no hepatomegaly, splenomegaly or mass.  ?   Tenderness: There is no abdominal tenderness.  ?   Hernia: No hernia is present.  ?Musculoskeletal:  ?   Cervical back: Neck supple.  ?   Right lower leg: No edema.  ?   Left lower leg: No edema.  ?Skin: ?   Findings: No rash.  ?Neurological:  ?   Mental Status: He is alert and oriented to person, place, and time.  ?   Motor: No weakness.  ?Psychiatric:     ?   Mood and Affect: Mood normal.     ?   Behavior: Behavior normal.  ? ? ?BP 140/83   Pulse 82   Ht '6\' 3"'$  (1.905 m)   Wt 201 lb 11.2 oz (91.5 kg)   BMI 25.21 kg/m?  ?Wt Readings from Last 3 Encounters:  ?07/14/21 201 lb 11.2 oz  (91.5 kg)  ?07/04/21 199 lb 12.8 oz (90.6 kg)  ?06/02/21 198 lb (89.8 kg)  ? ? ? ?Health Maintenance Due  ?Topic Date Due  ? Hepatitis C Screening  Never done  ? Zoster Vaccines- Shingrix (2 of 2) 07/05/2014  ? ? ?There are no preventive care reminders to display for this patient. ? ?No results found for: TSH ?Lab Results  ?Component Value Date  ? WBC 6.7 04/29/2021  ? HGB 16.8 06/09/2021  ? HCT 50.7 06/09/2021  ? MCV 92.9 04/29/2021  ? PLT 223 04/29/2021  ? ?Lab Results  ?Component Value Date  ? NA 133 (L) 08/07/2020  ? K 4.2 08/07/2020  ? CO2 29 08/07/2020  ? GLUCOSE 117 (H) 08/07/2020  ? BUN 14 08/07/2020  ? CREATININE 0.74 08/07/2020  ? BILITOT 0.8 08/07/2020  ? ALKPHOS 54 08/07/2020  ? AST 18 08/07/2020  ? ALT 16 08/07/2020  ? PROT 7.1 08/07/2020  ? ALBUMIN 3.7 08/07/2020  ? CALCIUM 8.7 (L) 08/07/2020  ? ANIONGAP 9 08/07/2020  ? ?No results found for: CHOL ?No results found for: HDL ?No results found for: [place] ?No results found for: TRIG ?No results found for: CHOLHDL ?No results found for: HGBA1C ? ?  ?Assessment & Plan:  ? ?Problem List Items Addressed This Visit   ? ?  ? Cardiovascular and Mediastinum  ? Chronic venous insufficiency - Primary  ?  Patient has surgery on the right leg ?  ?  ?  ? Other  ? Tobacco abuse  ?  - I instructed the patient to stop smoking and provided them with smoking cessation materials.  ?- I informed the patient that smoking puts them at increased risk for cancer, COPD, hypertension, and more.  ?- Informed the patient to seek help if they begin to have trouble breathing, develop chest pain, start to cough up blood, feel faint, or pass out. ?  ?  ? Bruit of left carotid artery  ?  Stable at present time ?  ?  ? Alcohol abuse  ?  Patient was advised to stop drinking ?  ?  ? ? ?No orders of the defined types were placed in this encounter. ? ? ?Follow-up: No follow-ups on file.  ? ? ?Cletis Athens, MD ?

## 2021-07-14 NOTE — Addendum Note (Signed)
Addended by: Alois Cliche on: 07/14/2021 03:11 PM ? ? Modules accepted: Orders ? ?

## 2021-07-14 NOTE — Assessment & Plan Note (Signed)
Patient was advised to stop drinking 

## 2021-07-15 ENCOUNTER — Telehealth: Payer: Self-pay

## 2021-07-15 NOTE — Telephone Encounter (Signed)
CALLED PATIENT NO ANSWER LEFT VOICEMAIL FOR A CALL BACK ? ?

## 2021-07-16 ENCOUNTER — Telehealth: Payer: Self-pay

## 2021-07-16 NOTE — Telephone Encounter (Signed)
CALLED PATIENT NO ANSWER LEFT VOICEMAIL FOR A CALL BACK LETTER SENT 

## 2021-07-16 NOTE — Telephone Encounter (Signed)
CALLED PATIENT NO ANSWER LEFT VOICEMAIL FOR A CALL BACK ? ?

## 2021-07-22 ENCOUNTER — Inpatient Hospital Stay: Payer: Medicare Other | Attending: Oncology

## 2021-07-22 ENCOUNTER — Inpatient Hospital Stay: Payer: Medicare Other

## 2021-07-22 VITALS — BP 128/85 | HR 59 | Temp 98.5°F | Resp 17

## 2021-07-22 DIAGNOSIS — D751 Secondary polycythemia: Secondary | ICD-10-CM

## 2021-07-22 DIAGNOSIS — D649 Anemia, unspecified: Secondary | ICD-10-CM

## 2021-07-22 LAB — HEMOGLOBIN AND HEMATOCRIT, BLOOD
HCT: 50.3 % (ref 39.0–52.0)
Hemoglobin: 16.6 g/dL (ref 13.0–17.0)

## 2021-07-22 NOTE — Progress Notes (Signed)
HCT 50.3 . Therapeutic phlebotomy performed by removing 500 ml of blood from L AC. Pt tolerated procedure well. VSS. Discharged to home. ?

## 2021-07-22 NOTE — Patient Instructions (Signed)

## 2021-07-24 ENCOUNTER — Encounter (INDEPENDENT_AMBULATORY_CARE_PROVIDER_SITE_OTHER): Payer: Self-pay | Admitting: Nurse Practitioner

## 2021-07-24 ENCOUNTER — Ambulatory Visit (INDEPENDENT_AMBULATORY_CARE_PROVIDER_SITE_OTHER): Payer: Medicare Other | Admitting: Nurse Practitioner

## 2021-07-24 VITALS — BP 143/80 | HR 71 | Resp 16 | Wt 197.8 lb

## 2021-07-24 DIAGNOSIS — I83819 Varicose veins of unspecified lower extremities with pain: Secondary | ICD-10-CM

## 2021-07-24 DIAGNOSIS — I83893 Varicose veins of bilateral lower extremities with other complications: Secondary | ICD-10-CM

## 2021-08-07 ENCOUNTER — Ambulatory Visit (INDEPENDENT_AMBULATORY_CARE_PROVIDER_SITE_OTHER): Payer: Medicare Other | Admitting: Nurse Practitioner

## 2021-08-09 ENCOUNTER — Encounter (INDEPENDENT_AMBULATORY_CARE_PROVIDER_SITE_OTHER): Payer: Self-pay | Admitting: Nurse Practitioner

## 2021-08-09 NOTE — Progress Notes (Signed)
Varicose veins of bilateral  lower extremity with inflammation (454.1  I83.10) Current Plans   Indication: Patient presents with symptomatic varicose veins of the bilateral  lower extremity.   Procedure: Sclerotherapy using hypertonic saline mixed with 1% Lidocaine was performed on the bilateral lower extremity. Compression wraps were placed. The patient tolerated the procedure well. 

## 2021-08-21 ENCOUNTER — Ambulatory Visit (INDEPENDENT_AMBULATORY_CARE_PROVIDER_SITE_OTHER): Payer: Medicare Other | Admitting: Nurse Practitioner

## 2021-08-21 ENCOUNTER — Encounter (INDEPENDENT_AMBULATORY_CARE_PROVIDER_SITE_OTHER): Payer: Self-pay | Admitting: Nurse Practitioner

## 2021-08-21 VITALS — BP 157/87 | HR 65 | Resp 16 | Wt 199.0 lb

## 2021-08-21 DIAGNOSIS — I83819 Varicose veins of unspecified lower extremities with pain: Secondary | ICD-10-CM

## 2021-08-21 DIAGNOSIS — I83893 Varicose veins of bilateral lower extremities with other complications: Secondary | ICD-10-CM | POA: Diagnosis not present

## 2021-08-23 ENCOUNTER — Encounter (INDEPENDENT_AMBULATORY_CARE_PROVIDER_SITE_OTHER): Payer: Self-pay | Admitting: Nurse Practitioner

## 2021-08-23 NOTE — Progress Notes (Signed)
Varicose veins of bilateral  lower extremity with inflammation (454.1  I83.10) Current Plans   Indication: Patient presents with symptomatic varicose veins of the bilateral  lower extremity.   Procedure: Sclerotherapy using hypertonic saline mixed with 1% Lidocaine was performed on the bilateral lower extremity. Compression wraps were placed. The patient tolerated the procedure well. 

## 2021-09-02 ENCOUNTER — Inpatient Hospital Stay: Payer: Medicare Other

## 2021-09-02 ENCOUNTER — Inpatient Hospital Stay: Payer: Medicare Other | Attending: Oncology

## 2021-09-10 ENCOUNTER — Other Ambulatory Visit: Payer: Self-pay | Admitting: Internal Medicine

## 2021-09-17 ENCOUNTER — Other Ambulatory Visit (INDEPENDENT_AMBULATORY_CARE_PROVIDER_SITE_OTHER): Payer: Self-pay | Admitting: Nurse Practitioner

## 2021-09-17 DIAGNOSIS — M79606 Pain in leg, unspecified: Secondary | ICD-10-CM

## 2021-09-19 ENCOUNTER — Ambulatory Visit (INDEPENDENT_AMBULATORY_CARE_PROVIDER_SITE_OTHER): Payer: Medicare Other

## 2021-09-19 ENCOUNTER — Ambulatory Visit (INDEPENDENT_AMBULATORY_CARE_PROVIDER_SITE_OTHER): Payer: Medicare Other | Admitting: Nurse Practitioner

## 2021-09-19 ENCOUNTER — Encounter (INDEPENDENT_AMBULATORY_CARE_PROVIDER_SITE_OTHER): Payer: Self-pay | Admitting: Nurse Practitioner

## 2021-09-19 VITALS — BP 154/86 | HR 63 | Resp 17 | Ht 75.0 in | Wt 201.0 lb

## 2021-09-19 DIAGNOSIS — I739 Peripheral vascular disease, unspecified: Secondary | ICD-10-CM

## 2021-09-19 DIAGNOSIS — I83819 Varicose veins of unspecified lower extremities with pain: Secondary | ICD-10-CM

## 2021-09-19 DIAGNOSIS — M79606 Pain in leg, unspecified: Secondary | ICD-10-CM | POA: Diagnosis not present

## 2021-09-22 ENCOUNTER — Encounter (INDEPENDENT_AMBULATORY_CARE_PROVIDER_SITE_OTHER): Payer: Self-pay | Admitting: Nurse Practitioner

## 2021-09-22 NOTE — Progress Notes (Signed)
Subjective:    Patient ID: Joshua Schmidt, male    DOB: December 16, 1950, 71 y.o.   MRN: [id number] No chief complaint on file.   Joshua Schmidt is a 71 year old male who returns today for follow-up evaluation after treatment of varicose veins.  He notes that he has some pain with ambulation but he also endorses lower back pain.  There is no rest pain.  There is no open wounds or ulcerations.  Patient also previously had therapy and he tolerated this well.  Today noninvasive studies show an ABI of 1.27 on the right and 1.32 on the left.  Patient has strong triphasic tibial artery waveforms bilaterally with strong toe waveforms bilaterally.    Review of Systems     Objective:   Physical Exam  BP (!) 154/86 (BP Location: Right Arm)   Pulse 63   Resp 17   Ht '6\' 3"'$  (1.905 m)   Wt 201 lb (91.2 kg)   BMI 25.12 kg/m   Past Medical History:  Diagnosis Date   Erythrocytosis 12/01/2018   Hypertension    Myocardial infarction [facility])     Social History   Socioeconomic History   Marital status: Married    Spouse name: Not on file   Number of children: Not on file   Years of education: Not on file   Highest education level: Not on file  Occupational History   Not on file  Tobacco Use   Smoking status: Every Day    Packs/day: 1.50    Years: 51.00    Total pack years: 76.50    Types: Cigarettes   Smokeless tobacco: Never   Tobacco comments:    1ppd currently  Substance and Sexual Activity   Alcohol use: No    Alcohol/week: 0.0 standard drinks of alcohol   Drug use: No   Sexual activity: Not on file  Other Topics Concern   Not on file  Social History Narrative   Not on file   Social Determinants of Health   Financial Resource Strain: Not on file  Food Insecurity: Not on file  Transportation Needs: Not on file  Physical Activity: Not on file  Stress: Not on file  Social Connections: Not on file  Intimate Partner Violence: Not on file    Past Surgical History:   Procedure Laterality Date   APPENDECTOMY     COLONOSCOPY WITH PROPOFOL N/A 11/20/2014   Procedure: COLONOSCOPY WITH PROPOFOL;  Surgeon: Christene Lye, MD;  Location: [facility] ENDOSCOPY;  Service: Endoscopy;  Laterality: N/A;   TONSILLECTOMY AND ADENOIDECTOMY      History reviewed. No pertinent family history.  No Known Allergies     Latest Ref Rng & Units 07/22/2021   12:33 PM 06/09/2021    1:34 PM 04/29/2021   12:53 PM  CBC  WBC 4.0 - 10.5 K/uL   6.7   Hemoglobin 13.0 - 17.0 g/dL 16.6  16.8  17.3    17.5   Hematocrit 39.0 - 52.0 % 50.3  50.7  51.8    52.6   Platelets 150 - 400 K/uL   223       CMP     Component Value Date/Time   NA 133 (L) 08/07/2020 1328   K 4.2 08/07/2020 1328   CL 95 (L) 08/07/2020 1328   CO2 29 08/07/2020 1328   GLUCOSE 117 (H) 08/07/2020 1328   BUN 14 08/07/2020 1328   CREATININE 0.74 08/07/2020 1328   CALCIUM 8.7 (L) 08/07/2020 1328  PROT 7.1 08/07/2020 1328   ALBUMIN 3.7 08/07/2020 1328   AST 18 08/07/2020 1328   ALT 16 08/07/2020 1328   ALKPHOS 54 08/07/2020 1328   BILITOT 0.8 08/07/2020 1328   GFRNONAA >60 08/07/2020 1328   GFRAA >60 11/30/2018 1558     No results found.     Assessment & Plan:   1. Varicose veins with pain The patient underwent treatment of his right lower extremity venous reflux in addition to sclerotherapy.  He notes that the lower extremity feels much better and no further treatment is required at this time.  Patient will follow-up with [place] on an as-needed basis.  2. Claudication [facility]) Based on noninvasive studies today the patient's claudication-like symptoms are very likely related to dysfunction with his PAD.  Today he has very strong perfusion in his bilateral lower extremities as indicated by ABIs.   Current Outpatient Medications on File Prior to Visit  Medication Sig Dispense Refill   atorvastatin (LIPITOR) 20 MG tablet TAKE 1 TABLET BY MOUTH  DAILY 90 tablet 3   hydrochlorothiazide (HYDRODIURIL) 25  MG tablet TAKE 1 TABLET BY MOUTH  DAILY 90 tablet 3   lisinopril (ZESTRIL) 20 MG tablet TAKE 1 TABLET BY MOUTH  DAILY 90 tablet 3   metoprolol tartrate (LOPRESSOR) 50 MG tablet TAKE 1 TABLET BY MOUTH  DAILY 90 tablet 3   No current facility-administered medications on file prior to visit.    There are no Patient Instructions on file for this visit. No follow-ups on file.   Kris Hartmann, NP

## 2021-10-20 ENCOUNTER — Encounter: Payer: Self-pay | Admitting: Internal Medicine

## 2021-10-20 ENCOUNTER — Ambulatory Visit (INDEPENDENT_AMBULATORY_CARE_PROVIDER_SITE_OTHER): Payer: Medicare Other | Admitting: Internal Medicine

## 2021-10-20 VITALS — BP 133/74 | HR 65 | Ht 75.0 in | Wt 204.0 lb

## 2021-10-20 DIAGNOSIS — I872 Venous insufficiency (chronic) (peripheral): Secondary | ICD-10-CM | POA: Diagnosis not present

## 2021-10-20 DIAGNOSIS — I83819 Varicose veins of unspecified lower extremities with pain: Secondary | ICD-10-CM

## 2021-10-20 DIAGNOSIS — Z720 Tobacco use: Secondary | ICD-10-CM | POA: Diagnosis not present

## 2021-10-20 DIAGNOSIS — R0989 Other specified symptoms and signs involving the circulatory and respiratory systems: Secondary | ICD-10-CM | POA: Diagnosis not present

## 2021-10-20 NOTE — Assessment & Plan Note (Signed)
Patient has minimal swelling of the both legs.  I advised to cut down on salt intake.  There is no evidence of DVT.

## 2021-10-20 NOTE — Assessment & Plan Note (Signed)
There is no evidence of neuropathy SLR is negative

## 2021-10-20 NOTE — Progress Notes (Signed)
Or drooling.  Established Patient Office Visit  Subjective:  Patient ID: Joshua Schmidt, male    DOB: 01/28/1951  Age: 71 y.o. MRN: [id number]  CC:  Chief Complaint  Patient presents with   Follow-up    3 month follow up    HPI  Joshua Schmidt presents for, drooling, patient is known to have high blood pressure has a history of myocardial infarction in the past..  He does not use any kind of drugs.  He has teeth repaired recently.  Does not give any history of trouble swallowing there is no swelling of the lymph nodes. He has been have multiple tick bite and wanted to be checked for the Lyme disease. Past Medical History:  Diagnosis Date   Erythrocytosis 12/01/2018   Hypertension    Myocardial infarction [facility])     Past Surgical History:  Procedure Laterality Date   APPENDECTOMY     COLONOSCOPY WITH PROPOFOL N/A 11/20/2014   Procedure: COLONOSCOPY WITH PROPOFOL;  Surgeon: Christene Lye, MD;  Location: [facility] ENDOSCOPY;  Service: Endoscopy;  Laterality: N/A;   TONSILLECTOMY AND ADENOIDECTOMY      History reviewed. No pertinent family history.  Social History   Socioeconomic History   Marital status: Married    Spouse name: Not on file   Number of children: Not on file   Years of education: Not on file   Highest education level: Not on file  Occupational History   Not on file  Tobacco Use   Smoking status: Every Day    Packs/day: 1.50    Years: 51.00    Total pack years: 76.50    Types: Cigarettes   Smokeless tobacco: Never   Tobacco comments:    1ppd currently  Substance and Sexual Activity   Alcohol use: No    Alcohol/week: 0.0 standard drinks of alcohol   Drug use: No   Sexual activity: Not on file  Other Topics Concern   Not on file  Social History Narrative   Not on file   Social Determinants of Health   Financial Resource Strain: Not on file  Food Insecurity: Not on file  Transportation Needs: Not on file  Physical Activity: Not on file   Stress: Not on file  Social Connections: Not on file  Intimate Partner Violence: Not on file     Current Outpatient Medications:    atorvastatin (LIPITOR) 20 MG tablet, TAKE 1 TABLET BY MOUTH  DAILY, Disp: 90 tablet, Rfl: 3   hydrochlorothiazide (HYDRODIURIL) 25 MG tablet, TAKE 1 TABLET BY MOUTH  DAILY, Disp: 90 tablet, Rfl: 3   lisinopril (ZESTRIL) 20 MG tablet, TAKE 1 TABLET BY MOUTH  DAILY, Disp: 90 tablet, Rfl: 3   metoprolol tartrate (LOPRESSOR) 50 MG tablet, TAKE 1 TABLET BY MOUTH  DAILY, Disp: 90 tablet, Rfl: 3   No Known Allergies  ROS Review of Systems  Constitutional: Negative.   HENT: Negative.    Eyes: Negative.   Respiratory: Negative.    Cardiovascular: Negative.   Gastrointestinal: Negative.   Endocrine: Negative.   Genitourinary: Negative.   Musculoskeletal: Negative.   Skin: Negative.   Allergic/Immunologic: Negative.   Neurological: Negative.   Hematological: Negative.   Psychiatric/Behavioral: Negative.    All other systems reviewed and are negative.     Objective:    Physical Exam Vitals reviewed.  Constitutional:      Appearance: Normal appearance.  HENT:     Mouth/Throat:     Mouth: Mucous membranes are moist.  Eyes:     Pupils: Pupils are equal, round, and reactive to light.  Neck:     Vascular: No carotid bruit.  Cardiovascular:     Rate and Rhythm: Normal rate and regular rhythm.     Pulses: Normal pulses.     Heart sounds: Normal heart sounds.  Pulmonary:     Effort: Pulmonary effort is normal.     Breath sounds: Normal breath sounds.  Abdominal:     General: Bowel sounds are normal.     Palpations: Abdomen is soft. There is no hepatomegaly, splenomegaly or mass.     Tenderness: There is no abdominal tenderness.     Hernia: No hernia is present.  Musculoskeletal:     Cervical back: Neck supple.     Right lower leg: No edema.     Left lower leg: No edema.  Skin:    Findings: No rash.  Neurological:     Mental Status: He is  alert and oriented to person, place, and time.     Motor: No weakness.  Psychiatric:        Mood and Affect: Mood normal.        Behavior: Behavior normal.     BP 133/74   Pulse 65   Ht '6\' 3"'$  (1.905 m)   Wt 204 lb (92.5 kg)   BMI 25.50 kg/m  Wt Readings from Last 3 Encounters:  10/20/21 204 lb (92.5 kg)  09/19/21 201 lb (91.2 kg)  08/21/21 199 lb (90.3 kg)     Health Maintenance Due  Topic Date Due   Hepatitis C Screening  Never done   Zoster Vaccines- Shingrix (2 of 2) 07/05/2014    There are no preventive care reminders to display for this patient.  No results found for: "TSH" Lab Results  Component Value Date   WBC 6.7 04/29/2021   HGB 16.6 07/22/2021   HCT 50.3 07/22/2021   MCV 92.9 04/29/2021   PLT 223 04/29/2021   Lab Results  Component Value Date   NA 133 (L) 08/07/2020   K 4.2 08/07/2020   CO2 29 08/07/2020   GLUCOSE 117 (H) 08/07/2020   BUN 14 08/07/2020   CREATININE 0.74 08/07/2020   BILITOT 0.8 08/07/2020   ALKPHOS 54 08/07/2020   AST 18 08/07/2020   ALT 16 08/07/2020   PROT 7.1 08/07/2020   ALBUMIN 3.7 08/07/2020   CALCIUM 8.7 (L) 08/07/2020   ANIONGAP 9 08/07/2020   No results found for: "CHOL" No results found for: "HDL" No results found for: "LDLCALC" No results found for: "TRIG" No results found for: "CHOLHDL" No results found for: "HGBA1C"    Assessment & Plan:   Problem List Items Addressed This Visit       Cardiovascular and Mediastinum   Varicose veins with pain    There is no evidence of neuropathy SLR is negative      Chronic venous insufficiency - Primary    Patient has minimal swelling of the both legs.  I advised to cut down on salt intake.  There is no evidence of DVT.        Other   Tobacco abuse    Attention-deficit/hyperactivity disorder (ADHD) is marked by an ongoing pattern of inattention and/or hyperactivity-impulsivity that interferes with functioning or development. People with ADHD experience an ongoing  pattern of the following types of symptoms:  Inattention means a person may have difficulty staying on task, sustaining focus, and staying organized, and these problems are not due to  defiance or lack of comprehension. Hyperactivity means a person may seem to move about constantly, including in situations when it is not appropriate, or excessively fidgets, taps, or talks. In adults, hyperactivity may mean extreme restlessness or talking too much. Impulsivity means a person may act without thinking or have difficulty with self-control. Impulsivity could also include a desire for immediate rewards or the inability to delay gratification. An impulsive person may interrupt others or make important decisions without considering long-term consequences.  Smoking      Bruit of left carotid artery    Stable at the present time.  Patient was advised to stop drinking alcohol.      Patient complains of tiredness so we will check a Lyme disease titer as he has been exposed to multiple tick bites recently.  We will also check her for anemia checking for diabetes and thyroid check. No orders of the defined types were placed in this encounter.   Follow-up: No follow-ups on file.    Cletis Athens, MD

## 2021-10-20 NOTE — Assessment & Plan Note (Signed)
Stable at the present time.  Patient was advised to stop drinking alcohol.

## 2021-10-20 NOTE — Assessment & Plan Note (Signed)
Attention-deficit/hyperactivity disorder (ADHD) is marked by an ongoing pattern of inattention and/or hyperactivity-impulsivity that interferes with functioning or development. People with ADHD experience an ongoing pattern of the following types of symptoms:  Inattention means a person may have difficulty staying on task, sustaining focus, and staying organized, and these problems are not due to defiance or lack of comprehension. Hyperactivity means a person may seem to move about constantly, including in situations when it is not appropriate, or excessively fidgets, taps, or talks. In adults, hyperactivity may mean extreme restlessness or talking too much. Impulsivity means a person may act without thinking or have difficulty with self-control. Impulsivity could also include a desire for immediate rewards or the inability to delay gratification. An impulsive person may interrupt others or make important decisions without considering long-term consequences.  Smoking

## 2021-10-21 ENCOUNTER — Ambulatory Visit: Payer: Medicare Other | Admitting: *Deleted

## 2021-10-21 DIAGNOSIS — Z0000 Encounter for general adult medical examination without abnormal findings: Secondary | ICD-10-CM | POA: Diagnosis not present

## 2021-10-21 DIAGNOSIS — W57XXXA Bitten or stung by nonvenomous insect and other nonvenomous arthropods, initial encounter: Secondary | ICD-10-CM

## 2021-10-21 NOTE — Addendum Note (Signed)
Addended by: Lacretia Nicks L on: 10/21/2021 11:45 AM   Modules accepted: Orders

## 2021-10-21 NOTE — Addendum Note (Signed)
Addended by: Alois Cliche on: 10/21/2021 11:39 AM   Modules accepted: Orders

## 2021-10-22 ENCOUNTER — Inpatient Hospital Stay: Payer: Medicare Other | Attending: Oncology

## 2021-10-22 ENCOUNTER — Encounter: Payer: Self-pay | Admitting: Oncology

## 2021-10-22 ENCOUNTER — Inpatient Hospital Stay: Payer: Medicare Other

## 2021-10-22 ENCOUNTER — Inpatient Hospital Stay (HOSPITAL_BASED_OUTPATIENT_CLINIC_OR_DEPARTMENT_OTHER): Payer: Medicare Other | Admitting: Oncology

## 2021-10-22 VITALS — BP 152/95 | HR 66 | Temp 97.9°F | Resp 18 | Wt 204.4 lb

## 2021-10-22 DIAGNOSIS — I10 Essential (primary) hypertension: Secondary | ICD-10-CM | POA: Diagnosis not present

## 2021-10-22 DIAGNOSIS — Z87891 Personal history of nicotine dependence: Secondary | ICD-10-CM

## 2021-10-22 DIAGNOSIS — D649 Anemia, unspecified: Secondary | ICD-10-CM

## 2021-10-22 DIAGNOSIS — E611 Iron deficiency: Secondary | ICD-10-CM

## 2021-10-22 DIAGNOSIS — D751 Secondary polycythemia: Secondary | ICD-10-CM | POA: Insufficient documentation

## 2021-10-22 DIAGNOSIS — F17210 Nicotine dependence, cigarettes, uncomplicated: Secondary | ICD-10-CM | POA: Diagnosis not present

## 2021-10-22 DIAGNOSIS — Z79899 Other long term (current) drug therapy: Secondary | ICD-10-CM | POA: Insufficient documentation

## 2021-10-22 DIAGNOSIS — G4730 Sleep apnea, unspecified: Secondary | ICD-10-CM | POA: Insufficient documentation

## 2021-10-22 DIAGNOSIS — I252 Old myocardial infarction: Secondary | ICD-10-CM | POA: Insufficient documentation

## 2021-10-22 LAB — CBC WITH DIFFERENTIAL/PLATELET
Abs Immature Granulocytes: 0.01 10*3/uL (ref 0.00–0.07)
Basophils Absolute: 0.1 10*3/uL (ref 0.0–0.1)
Basophils Relative: 1 %
Eosinophils Absolute: 0.2 10*3/uL (ref 0.0–0.5)
Eosinophils Relative: 2 %
HCT: 50.3 % (ref 39.0–52.0)
Hemoglobin: 16 g/dL (ref 13.0–17.0)
Immature Granulocytes: 0 %
Lymphocytes Relative: 15 %
Lymphs Abs: 1.2 10*3/uL (ref 0.7–4.0)
MCH: 28.8 pg (ref 26.0–34.0)
MCHC: 31.8 g/dL (ref 30.0–36.0)
MCV: 90.6 fL (ref 80.0–100.0)
Monocytes Absolute: 0.8 10*3/uL (ref 0.1–1.0)
Monocytes Relative: 10 %
Neutro Abs: 5.8 10*3/uL (ref 1.7–7.7)
Neutrophils Relative %: 72 %
Platelets: 205 10*3/uL (ref 150–400)
RBC: 5.55 MIL/uL (ref 4.22–5.81)
RDW: 19.9 % — ABNORMAL HIGH (ref 11.5–15.5)
WBC: 8.1 10*3/uL (ref 4.0–10.5)
nRBC: 0 % (ref 0.0–0.2)

## 2021-10-22 LAB — COMPLETE METABOLIC PANEL WITH GFR
AG Ratio: 1.2 (calc) (ref 1.0–2.5)
ALT: 19 U/L (ref 9–46)
AST: 21 U/L (ref 10–35)
Albumin: 3.9 g/dL (ref 3.6–5.1)
Alkaline phosphatase (APISO): 66 U/L (ref 35–144)
BUN: 12 mg/dL (ref 7–25)
CO2: 28 mmol/L (ref 20–32)
Calcium: 8.6 mg/dL (ref 8.6–10.3)
Chloride: 101 mmol/L (ref 98–110)
Creat: 0.78 mg/dL (ref 0.70–1.28)
Globulin: 3.3 g/dL (calc) (ref 1.9–3.7)
Glucose, Bld: 96 mg/dL (ref 65–99)
Potassium: 4.8 mmol/L (ref 3.5–5.3)
Sodium: 139 mmol/L (ref 135–146)
Total Bilirubin: 0.3 mg/dL (ref 0.2–1.2)
Total Protein: 7.2 g/dL (ref 6.1–8.1)
eGFR: 95 mL/min/{1.73_m2} (ref >=60)

## 2021-10-22 LAB — FERRITIN: Ferritin: 8 ng/mL — ABNORMAL LOW (ref 24–336)

## 2021-10-22 LAB — PSA: PSA: 0.78 ng/mL (ref <=4.00)

## 2021-10-22 LAB — LIPID PANEL
Cholesterol: 133 mg/dL (ref <200)
HDL: 69 mg/dL (ref >=40)
LDL Cholesterol (Calc): 50 mg/dL (calc)
Non-HDL Cholesterol (Calc): 64 mg/dL (calc) (ref <130)
Total CHOL/HDL Ratio: 1.9 (calc) (ref <5.0)
Triglycerides: 63 mg/dL (ref <150)

## 2021-10-22 LAB — IRON AND TIBC
Iron: 27 ug/dL — ABNORMAL LOW (ref 45–182)
Saturation Ratios: 6 % — ABNORMAL LOW (ref 17.9–39.5)
TIBC: 428 ug/dL (ref 250–450)
UIBC: 401 ug/dL

## 2021-10-22 LAB — TSH: TSH: 0.69 mIU/L (ref 0.40–4.50)

## 2021-10-22 NOTE — Progress Notes (Signed)
Hematology/Oncology Progress note [phone] [phone]      Patient Care Team: Cletis Athens, MD as PCP - General (Internal Medicine)  REFERRING PROVIDER: Cletis Athens, MD  CHIEF COMPLAINTS/REASON FOR VISIT:  Follow up for erythrocytosis.  HISTORY OF PRESENTING ILLNESS:  Joshua Schmidt is a 71 y.o. male who was seen in consultation at the request of Cletis Athens, MD for evaluation of polycytosis/erythrocytosis Patient had lab work done with primary care provider and was found to have high hemoglobin.  Lab results are not available to me.  Patient was referred to heme-onc for further evaluation and discussion. Associated signs or symptoms: Denies weight loss, fever, chills, fatigue, night sweats.   Context:  Smoking history: 76.5 pack year smoking history, he reports that he quitted in the past and then smoke again Testosterone supplements: Denies History of blood clots: Denies Daytime somnolence: Denies Family history of polycythemia: Denies Denies any personal history of thrombosis, stroke, heart attack.  Denies any skin itchiness.  He is a retired Clinical biochemist, remains active.  Mainly works outdoors. Sleep apnea, not interested in using CPAP machine  INTERVAL HISTORY Joshua Schmidt is a 71 y.o. male who has above history reviewed by me today presents for follow up visit for management of erythrocytosis Patient smokes about 1 pack of cigarettes and sometimes he smokes cigars. He does not use CPAP machine Intermittent right ankle swelling, varicose vein, previously seen by vascular surgeon and status post laser ablation of the right great saphenous vein on 05/05/2021.  Post laser ultrasound showed no DVT. Status post sclerotherapy.   Review of Systems  Constitutional:  Negative for appetite change, chills, diaphoresis, fatigue, fever and unexpected weight change.  HENT:   Negative for hearing loss, lump/mass, nosebleeds, sore throat and voice  change.   Eyes:  Negative for eye problems and icterus.  Respiratory:  Negative for chest tightness, cough, hemoptysis, shortness of breath and wheezing.   Cardiovascular:  Positive for leg swelling. Negative for chest pain.  Gastrointestinal:  Negative for abdominal distention, abdominal pain, blood in stool, diarrhea, nausea and rectal pain.  Endocrine: Negative for hot flashes.  Genitourinary:  Negative for bladder incontinence, difficulty urinating, dysuria, frequency, hematuria and nocturia.   Musculoskeletal:  Negative for arthralgias, back pain, flank pain, gait problem and myalgias.  Skin:  Negative for itching and rash.  Neurological:  Negative for dizziness, gait problem, headaches, light-headedness, numbness and seizures.  Hematological:  Negative for adenopathy. Does not bruise/bleed easily.  Psychiatric/Behavioral:  Negative for confusion and decreased concentration. The patient is not nervous/anxious.     MEDICAL HISTORY:  Past Medical History:  Diagnosis Date   Erythrocytosis 12/01/2018   Hypertension    Myocardial infarction [facility])     SURGICAL HISTORY: Past Surgical History:  Procedure Laterality Date   APPENDECTOMY     COLONOSCOPY WITH PROPOFOL N/A 11/20/2014   Procedure: COLONOSCOPY WITH PROPOFOL;  Surgeon: Christene Lye, MD;  Location: [facility] ENDOSCOPY;  Service: Endoscopy;  Laterality: N/A;   TONSILLECTOMY AND ADENOIDECTOMY      SOCIAL HISTORY: Social History   Socioeconomic History   Marital status: Married    Spouse name: Not on file   Number of children: Not on file   Years of education: Not on file   Highest education level: Not on file  Occupational History   Not on file  Tobacco Use   Smoking status: Every Day    Packs/day: 1.50    Years: 51.00    Total pack  years: 76.50    Types: Cigarettes   Smokeless tobacco: Never   Tobacco comments:    1ppd currently  Substance and Sexual Activity   Alcohol use: No    Alcohol/week: 0.0 standard  drinks of alcohol   Drug use: No   Sexual activity: Not on file  Other Topics Concern   Not on file  Social History Narrative   Not on file   Social Determinants of Health   Financial Resource Strain: Not on file  Food Insecurity: Not on file  Transportation Needs: Not on file  Physical Activity: Not on file  Stress: Not on file  Social Connections: Not on file  Intimate Partner Violence: Not on file    FAMILY HISTORY: History reviewed. No pertinent family history.  ALLERGIES:  has No Known Allergies.  MEDICATIONS:  Current Outpatient Medications  Medication Sig Dispense Refill   atorvastatin (LIPITOR) 20 MG tablet TAKE 1 TABLET BY MOUTH  DAILY 90 tablet 3   hydrochlorothiazide (HYDRODIURIL) 25 MG tablet TAKE 1 TABLET BY MOUTH  DAILY 90 tablet 3   lisinopril (ZESTRIL) 20 MG tablet TAKE 1 TABLET BY MOUTH  DAILY 90 tablet 3   metoprolol tartrate (LOPRESSOR) 50 MG tablet TAKE 1 TABLET BY MOUTH  DAILY 90 tablet 3   No current facility-administered medications for this visit.     PHYSICAL EXAMINATION: ECOG PERFORMANCE STATUS: 0 - Asymptomatic Vitals:   10/22/21 1335  BP: (!) 152/95  Pulse: 66  Resp: 18  Temp: 97.9 F (36.6 C)   Filed Weights   10/22/21 1335  Weight: 204 lb 6.4 oz (92.7 kg)    Physical Exam Constitutional:      General: He is not in acute distress. HENT:     Head: Normocephalic and atraumatic.  Eyes:     General: No scleral icterus.    Pupils: Pupils are equal, round, and reactive to light.  Cardiovascular:     Rate and Rhythm: Normal rate and regular rhythm.     Heart sounds: Normal heart sounds.  Pulmonary:     Effort: Pulmonary effort is normal. No respiratory distress.     Breath sounds: No wheezing.  Abdominal:     General: Bowel sounds are normal. There is no distension.     Palpations: Abdomen is soft. There is no mass.     Tenderness: There is no abdominal tenderness.  Musculoskeletal:        General: Swelling present. No  deformity. Normal range of motion.     Cervical back: Normal range of motion and neck supple.  Skin:    General: Skin is warm and dry.     Findings: No erythema or rash.  Neurological:     Mental Status: He is alert and oriented to person, place, and time. Mental status is at baseline.     Cranial Nerves: No cranial nerve deficit.     Coordination: Coordination normal.  Psychiatric:        Mood and Affect: Mood normal.        Behavior: Behavior normal.        Thought Content: Thought content normal.     RADIOGRAPHIC STUDIES: I have personally reviewed the radiological images as listed and agreed with the findings in the report. No results found.   LABORATORY DATA:  I have reviewed the data as listed    Latest Ref Rng & Units 10/22/2021    1:26 PM 07/22/2021   12:33 PM 06/09/2021    1:34 PM  CBC  WBC 4.0 - 10.5 K/uL 8.1     Hemoglobin 13.0 - 17.0 g/dL 16.0  16.6  16.8   Hematocrit 39.0 - 52.0 % 50.3  50.3  50.7   Platelets 150 - 400 K/uL 205         Latest Ref Rng & Units 10/21/2021   11:45 AM 08/07/2020    1:28 PM 11/30/2018    3:58 PM  CMP  Glucose 65 - 99 mg/dL 96  117  102   BUN 7 - 25 mg/dL '12  14  8   '$ Creatinine 0.70 - 1.28 mg/dL 0.78  0.74  0.71   Sodium 135 - 146 mmol/L 139  133  138   Potassium 3.5 - 5.3 mmol/L 4.8  4.2  3.7   Chloride 98 - 110 mmol/L 101  95  100   CO2 20 - 32 mmol/L '28  29  29   '$ Calcium 8.6 - 10.3 mg/dL 8.6  8.7  9.1   Total Protein 6.1 - 8.1 g/dL 7.2  7.1  8.0   Total Bilirubin 0.2 - 1.2 mg/dL 0.3  0.8  0.7   Alkaline Phos 38 - 126 U/L  54  70   AST 10 - 35 U/L '21  18  21   '$ ALT 9 - 46 U/L '19  16  22     '$ Iron/TIBC/Ferritin/ %Sat    Component Value Date/Time   IRON 27 (L) 10/22/2021 1326   TIBC 428 10/22/2021 1326   FERRITIN 8 (L) 10/22/2021 1326   IRONPCTSAT 6 (L) 10/22/2021 1326        ASSESSMENT & PLAN:  1. Secondary erythrocytosis   2. Iron deficiency   3. Personal history of tobacco use, presenting hazards to health     #Secondary erythrocytosis, Labs reviewed and discussed with patient. Hemoglobin and hematocrit have improved. Continue monitor and proceed with phlebotomy if hematocrit is >-=52  Smoke cessation was discussed with him. Sleep apnea, he is not interested in using CPAP machine.  # lung cancer screening, he is due in October 2023.  Continue follow-up with lung cancer screening program. #Iron deficiency without anemia.  Recommend GI work-up.  Follow up  3 months.    Orders Placed This Encounter  Procedures   CBC with Differential/Platelet    Standing Status:   Future    Standing Expiration Date:   10/23/2022   Comprehensive metabolic panel    Standing Status:   Future    Standing Expiration Date:   10/22/2022    We spent sufficient time to discuss many aspect of care, questions were answered to patient's satisfaction. The patient knows to call the clinic with any problems questions or concerns.  Return of visit: 3 months Earlie Server, MD, PhD 10/22/2021

## 2021-10-22 NOTE — Progress Notes (Signed)
Pt here for follow up. No new concerns voiced.   

## 2021-10-22 NOTE — Progress Notes (Signed)
No phlebotomy today per Dr Yu. °

## 2021-10-23 ENCOUNTER — Telehealth: Payer: Self-pay

## 2021-10-23 DIAGNOSIS — E611 Iron deficiency: Secondary | ICD-10-CM

## 2021-10-23 NOTE — Telephone Encounter (Signed)
Spoke to patient and informed him of restuls and recommendations. He is not established with GI, so referral was entered. Pt verbalized understanding and will wait for call from GI.

## 2021-10-23 NOTE — Telephone Encounter (Signed)
-----   Message from Earlie Server, MD sent at 10/22/2021 11:07 PM EDT ----- Please let patient know that his iron levels are low.  Recommend patient to see gastroenterology for further evaluation.  If he does not have a GI doctor, will refer to Dr. Marius Ditch.

## 2021-10-25 LAB — ROCKY MTN SPOTTED FVR AB, IGG-BLOOD

## 2021-11-04 ENCOUNTER — Encounter: Payer: Self-pay | Admitting: Internal Medicine

## 2021-11-04 ENCOUNTER — Ambulatory Visit (INDEPENDENT_AMBULATORY_CARE_PROVIDER_SITE_OTHER): Payer: Medicare Other | Admitting: Internal Medicine

## 2021-11-04 VITALS — BP 145/85 | HR 62 | Ht 75.0 in | Wt 205.3 lb

## 2021-11-04 DIAGNOSIS — Z720 Tobacco use: Secondary | ICD-10-CM | POA: Diagnosis not present

## 2021-11-04 DIAGNOSIS — D751 Secondary polycythemia: Secondary | ICD-10-CM

## 2021-11-04 DIAGNOSIS — I872 Venous insufficiency (chronic) (peripheral): Secondary | ICD-10-CM

## 2021-11-04 DIAGNOSIS — F1010 Alcohol abuse, uncomplicated: Secondary | ICD-10-CM

## 2021-11-04 NOTE — Assessment & Plan Note (Signed)
Patient ferritin level is low so we will send him to a gastroenterologist

## 2021-11-04 NOTE — Assessment & Plan Note (Signed)
Patient was advised to drink only 1 beer a day

## 2021-11-04 NOTE — Progress Notes (Signed)
Established Patient Office Visit  Subjective:  Patient ID: Joshua Schmidt, male    DOB: 13-Nov-1950  Age: 71 y.o. MRN: [id number]  CC:  Chief Complaint  Patient presents with   Lab Results    HPI  Joshua Schmidt presents for lab check  Past Medical History:  Diagnosis Date   Erythrocytosis 12/01/2018   Hypertension    Myocardial infarction [facility])     Past Surgical History:  Procedure Laterality Date   APPENDECTOMY     COLONOSCOPY WITH PROPOFOL N/A 11/20/2014   Procedure: COLONOSCOPY WITH PROPOFOL;  Surgeon: Christene Lye, MD;  Location: [facility] ENDOSCOPY;  Service: Endoscopy;  Laterality: N/A;   TONSILLECTOMY AND ADENOIDECTOMY      History reviewed. No pertinent family history.  Social History   Socioeconomic History   Marital status: Married    Spouse name: Not on file   Number of children: Not on file   Years of education: Not on file   Highest education level: Not on file  Occupational History   Not on file  Tobacco Use   Smoking status: Every Day    Packs/day: 1.50    Years: 51.00    Total pack years: 76.50    Types: Cigarettes   Smokeless tobacco: Never   Tobacco comments:    1ppd currently  Substance and Sexual Activity   Alcohol use: No    Alcohol/week: 0.0 standard drinks of alcohol   Drug use: No   Sexual activity: Not on file  Other Topics Concern   Not on file  Social History Narrative   Not on file   Social Determinants of Health   Financial Resource Strain: Not on file  Food Insecurity: Not on file  Transportation Needs: Not on file  Physical Activity: Not on file  Stress: Not on file  Social Connections: Not on file  Intimate Partner Violence: Not on file     Current Outpatient Medications:    atorvastatin (LIPITOR) 20 MG tablet, TAKE 1 TABLET BY MOUTH  DAILY, Disp: 90 tablet, Rfl: 3   hydrochlorothiazide (HYDRODIURIL) 25 MG tablet, TAKE 1 TABLET BY MOUTH  DAILY, Disp: 90 tablet, Rfl: 3   lisinopril (ZESTRIL) 20 MG tablet,  TAKE 1 TABLET BY MOUTH  DAILY, Disp: 90 tablet, Rfl: 3   metoprolol tartrate (LOPRESSOR) 50 MG tablet, TAKE 1 TABLET BY MOUTH  DAILY, Disp: 90 tablet, Rfl: 3   No Known Allergies  ROS Review of Systems  Constitutional: Negative.   HENT: Negative.    Eyes: Negative.   Respiratory: Negative.    Cardiovascular: Negative.   Gastrointestinal: Negative.   Endocrine: Negative.   Genitourinary: Negative.   Musculoskeletal: Negative.   Skin: Negative.   Allergic/Immunologic: Negative.   Neurological: Negative.   Hematological: Negative.   Psychiatric/Behavioral: Negative.    All other systems reviewed and are negative.     Objective:    Physical Exam Vitals reviewed.  Constitutional:      Appearance: Normal appearance.  HENT:     Mouth/Throat:     Mouth: Mucous membranes are moist.  Eyes:     Pupils: Pupils are equal, round, and reactive to light.  Neck:     Vascular: No carotid bruit.  Cardiovascular:     Rate and Rhythm: Normal rate and regular rhythm.     Pulses: Normal pulses.     Heart sounds: Normal heart sounds.  Pulmonary:     Effort: Pulmonary effort is normal.     Breath sounds:  Normal breath sounds.  Abdominal:     General: Bowel sounds are normal.     Palpations: Abdomen is soft. There is no hepatomegaly, splenomegaly or mass.     Tenderness: There is no abdominal tenderness.     Hernia: No hernia is present.  Musculoskeletal:     Cervical back: Neck supple.     Right lower leg: No edema.     Left lower leg: No edema.  Skin:    Findings: No rash.  Neurological:     Mental Status: He is alert and oriented to person, place, and time.     Motor: No weakness.  Psychiatric:        Mood and Affect: Mood normal.        Behavior: Behavior normal.     BP (!) 145/85   Pulse 62   Ht _0  (1.905 m)   Wt 205 lb 4.8 oz (93.1 kg)   BMI 25.66 kg/m  Wt Readings from Last 3 Encounters:  11/04/21 205 lb 4.8 oz (93.1 kg)  10/22/21 204 lb 6.4 oz (92.7 kg)   10/20/21 204 lb (92.5 kg)     Health Maintenance Due  Topic Date Due   Hepatitis C Screening  Never done   Zoster Vaccines- Shingrix (2 of 2) 07/05/2014    There are no preventive care reminders to display for this patient.  Lab Results  Component Value Date   TSH 0.69 10/21/2021   Lab Results  Component Value Date   WBC 8.1 10/22/2021   HGB 16.0 10/22/2021   HCT 50.3 10/22/2021   MCV 90.6 10/22/2021   PLT 205 10/22/2021   Lab Results  Component Value Date   NA 139 10/21/2021   K 4.8 10/21/2021   CO2 28 10/21/2021   GLUCOSE 96 10/21/2021   BUN 12 10/21/2021   CREATININE 0.78 10/21/2021   BILITOT 0.3 10/21/2021   ALKPHOS 54 08/07/2020   AST 21 10/21/2021   ALT 19 10/21/2021   PROT 7.2 10/21/2021   ALBUMIN 3.7 08/07/2020   CALCIUM 8.6 10/21/2021   ANIONGAP 9 08/07/2020   EGFR 95 10/21/2021   Lab Results  Component Value Date   CHOL 133 10/21/2021   Lab Results  Component Value Date   HDL 69 10/21/2021   Lab Results  Component Value Date   LDLCALC 50 10/21/2021   Lab Results  Component Value Date   TRIG 63 10/21/2021   Lab Results  Component Value Date   CHOLHDL 1.9 10/21/2021   No results found for: "HGBA1C"    Assessment & Plan:   Problem List Items Addressed This Visit       Cardiovascular and Mediastinum   Chronic venous insufficiency - Primary    Stable at the present time        Other   Erythrocytosis (Chronic)    Patient ferritin level is low so we will send him to a gastroenterologist      Tobacco abuse    - I instructed the patient to stop smoking and provided them with smoking cessation materials.  - I informed the patient that smoking puts them at increased risk for cancer, COPD, hypertension, and more.  - Informed the patient to seek help if they begin to have trouble breathing, develop chest pain, start to cough up blood, feel faint, or pass out.      Alcohol abuse    Patient was advised to drink only 1 beer a day  No orders of the defined types were placed in this encounter.   Follow-up: No follow-ups on file.    Cletis Athens, MD

## 2021-11-04 NOTE — Assessment & Plan Note (Signed)
Stable at the present time. 

## 2021-11-04 NOTE — Assessment & Plan Note (Signed)
-   I instructed the patient to stop smoking and provided them with smoking cessation materials.  - I informed the patient that smoking puts them at increased risk for cancer, COPD, hypertension, and more.  - Informed the patient to seek help if they begin to have trouble breathing, develop chest pain, start to cough up blood, feel faint, or pass out.  

## 2021-11-06 ENCOUNTER — Ambulatory Visit (INDEPENDENT_AMBULATORY_CARE_PROVIDER_SITE_OTHER): Payer: Medicare Other

## 2021-11-06 DIAGNOSIS — Z0000 Encounter for general adult medical examination without abnormal findings: Secondary | ICD-10-CM

## 2021-11-06 DIAGNOSIS — Z122 Encounter for screening for malignant neoplasm of respiratory organs: Secondary | ICD-10-CM

## 2021-11-06 DIAGNOSIS — W57XXXA Bitten or stung by nonvenomous insect and other nonvenomous arthropods, initial encounter: Secondary | ICD-10-CM

## 2021-11-06 NOTE — Progress Notes (Addendum)
Subjective:   Joshua Schmidt is a 71 y.o. male who presents for Medicare Annual/Subsequent preventive examination. I discussed the limitations of evaluation and management by telemedicine and the availability of in person appointments. The patient expressed understanding and agreed to proceed.   Visit performed by audio   Patient location: Home  Provider location: Home  Review of Systems    N/A       Objective:    There were no vitals filed for this visit. There is no height or weight on file to calculate BMI.     11/06/2021    9:04 AM 10/22/2021    1:32 PM 08/07/2020    1:46 PM 05/10/2020    2:17 PM 02/05/2020    1:26 PM 02/05/2020    1:25 PM 11/07/2019    1:01 PM  Advanced Directives  Does Patient Have a Medical Advance Directive? Yes Yes Yes Yes Yes No No  Type of Advance Directive Living will;Healthcare Power of Attorney Living will;Healthcare Power of Attorney Living will;Healthcare Power of Attorney Living will;Healthcare Power of Attorney Living will;Healthcare Power of Attorney    Copy of [place] in Chart? No - copy requested No - copy requested   No - copy requested      Current Medications (verified) Outpatient Encounter Medications as of 11/06/2021  Medication Sig   atorvastatin (LIPITOR) 20 MG tablet TAKE 1 TABLET BY MOUTH  DAILY   hydrochlorothiazide (HYDRODIURIL) 25 MG tablet TAKE 1 TABLET BY MOUTH  DAILY   lisinopril (ZESTRIL) 20 MG tablet TAKE 1 TABLET BY MOUTH  DAILY   metoprolol tartrate (LOPRESSOR) 50 MG tablet TAKE 1 TABLET BY MOUTH  DAILY   No facility-administered encounter medications on file as of 11/06/2021.    Allergies (verified) Patient has no known allergies.   History: Past Medical History:  Diagnosis Date   Erythrocytosis 12/01/2018   Hypertension    Myocardial infarction [facility])    Past Surgical History:  Procedure Laterality Date   APPENDECTOMY     COLONOSCOPY WITH PROPOFOL N/A 11/20/2014   Procedure:  COLONOSCOPY WITH PROPOFOL;  Surgeon: Christene Lye, MD;  Location: [facility] ENDOSCOPY;  Service: Endoscopy;  Laterality: N/A;   TONSILLECTOMY AND ADENOIDECTOMY     No family history on file. Social History   Socioeconomic History   Marital status: Married    Spouse name: Not on file   Number of children: Not on file   Years of education: Not on file   Highest education level: Not on file  Occupational History   Not on file  Tobacco Use   Smoking status: Every Day    Packs/day: 1.50    Years: 51.00    Total pack years: 76.50    Types: Cigarettes   Smokeless tobacco: Never   Tobacco comments:    1ppd currently  Substance and Sexual Activity   Alcohol use: No    Alcohol/week: 0.0 standard drinks of alcohol   Drug use: No   Sexual activity: Not on file  Other Topics Concern   Not on file  Social History Narrative   Not on file   Social Determinants of Health   Financial Resource Strain: Low Risk  (11/06/2021)   Overall Financial Resource Strain (CARDIA)    Difficulty of Paying Living Expenses: Not very hard  Food Insecurity: No Food Insecurity (11/06/2021)   Hunger Vital Sign    Worried About Running Out of Food in the Last Year: Never true    Ran  Out of Food in the Last Year: Never true  Transportation Needs: No Transportation Needs (11/06/2021)   PRAPARE - Hydrologist (Medical): No    Lack of Transportation (Non-Medical): No  Physical Activity: Sufficiently Active (11/06/2021)   Exercise Vital Sign    Days of Exercise per Week: 3 days    Minutes of Exercise per Session: 90 min  Stress: No Stress Concern Present (11/06/2021)   [place]    Feeling of Stress : Only a little  Social Connections: Unknown (11/06/2021)   Social Connection and Isolation Panel [NHANES]    Frequency of Communication with Friends and Family: Three times a week    Frequency of Social Gatherings  with Friends and Family: More than three times a week    Attends Religious Services: Not on Advertising copywriter or Organizations: No    Attends Archivist Meetings: Never    Marital Status: Married    Tobacco Counseling Ready to quit: Not Answered Counseling given: Not Answered Tobacco comments: 1ppd currently   Clinical Intake:  Pre-visit preparation completed: Yes           How often do you need to have someone help you when you read instructions, pamphlets, or other written materials from your doctor or pharmacy?: 1 - Never  Diabetic?No  Interpreter Needed?: No  Information entered by :: [place] CMA   Activities of Daily Living    11/06/2021    9:05 AM 11/05/2021    9:15 AM  In your present state of health, do you have any difficulty performing the following activities:  Hearing? 0 0  Vision? 0 0  Difficulty concentrating or making decisions? 0 0  Walking or climbing stairs? 0 0  Dressing or bathing? 0 0  Doing errands, shopping? 0 0  Preparing Food and eating ? N N  Using the Toilet? N N  In the past six months, have you accidently leaked urine? N N  Do you have problems with loss of bowel control? N N  Managing your Medications? N N  Managing your Finances? N N  Housekeeping or managing your Housekeeping? N N    Patient Care Team: Cletis Athens, MD as PCP - General (Internal Medicine)  Indicate any recent Medical Services you may have received from other than [facility] providers in the past year (date may be approximate).     Assessment:   This is a routine wellness examination for [facility].  Hearing/Vision screen No results found.  Dietary issues and exercise activities discussed:     Goals Addressed   None    Depression Screen    02/11/2021    3:49 PM 09/06/2020    9:39 AM  PHQ 2/9 Scores  PHQ - 2 Score 0 0    Fall Risk    11/06/2021    9:05 AM 11/05/2021    9:15 AM 11/03/2021   10:58 AM 02/11/2021    3:49 PM  09/06/2020    9:39 AM  [place] in the past year? 0 0 0   0 0 1  Number falls in past yr: 0 0 0   0 0 1  Injury with Fall? 0 0 0   0 0 1  Risk for fall due to : No Fall Risks   No Fall Risks History of fall(s)  Follow up Falls evaluation completed   Falls evaluation  completed     FALL RISK PREVENTION PERTAINING TO THE HOME:  Any stairs in or around the home? No  If so, are there any without handrails? Yes  Home free of loose throw rugs in walkways, pet beds, electrical cords, etc? Yes  Adequate lighting in your home to reduce risk of falls? Yes   ASSISTIVE DEVICES UTILIZED TO PREVENT FALLS:  Life alert? No  Use of a cane, walker or w/c? No  Grab bars in the bathroom? Yes  Shower chair or bench in shower? No  Elevated toilet seat or a handicapped toilet? Yes   TIMED UP AND GO:  Was the test performed? No .  Length of time to ambulate 10 feet: 0 sec.     Cognitive Function:        Immunizations Immunization History  Administered Date(s) Administered   Influenza, High Dose Seasonal PF 01/12/2018   Influenza-Unspecified 01/06/2021   PFIZER(Purple Top)SARS-COV-2 Vaccination 06/03/2019, 06/27/2019, 01/11/2020, 07/26/2020   PNEUMOCOCCAL CONJUGATE-20 02/10/2021   Pfizer Covid-19 Vaccine Bivalent Booster 5y-11y 01/06/2021   Tdap 02/10/2021   Zoster Recombinat (Shingrix) 05/10/2014    TDAP status: Up to date  Flu Vaccine status: Up to date  Pneumococcal vaccine status: Up to date  Covid-19 vaccine status: Completed vaccines  Qualifies for Shingles Vaccine? Yes   Zostavax completed No   Shingrix Completed?: No.    Education has been provided regarding the importance of this vaccine. Patient has been advised to call insurance company to determine out of pocket expense if they have not yet received this vaccine. Advised may also receive vaccine at local pharmacy or Health Dept. Verbalized acceptance and understanding.  Screening Tests Health Maintenance   Topic Date Due   Hepatitis C Screening  Never done   Zoster Vaccines- Shingrix (2 of 2) 07/05/2014   INFLUENZA VACCINE  11/11/2021   COLONOSCOPY (Pts 45-52yr Insurance coverage will need to be confirmed)  11/19/2024   TETANUS/TDAP  02/11/2031   Pneumonia Vaccine 72 Years old  Completed   COVID-19 Vaccine  Completed   HPV VACCINES  Aged Out    Health Maintenance  Health Maintenance Due  Topic Date Due   Hepatitis C Screening  Never done   Zoster Vaccines- Shingrix (2 of 2) 07/05/2014    Colorectal cancer screening: Type of screening: Colonoscopy. Completed 2016. Repeat every 10 years  Lung Cancer Screening: (Low Dose CT Chest recommended if Age 72-80years, 30 pack-year currently smoking OR have quit w/in 15years.) does qualify.   Lung Cancer Screening Referral: Yes  Additional Screening:  Hepatitis C Screening: does qualify; Completed No  Vision Screening: Recommended annual ophthalmology exams for early detection of glaucoma and other disorders of the eye. Is the patient up to date with their annual eye exam?  No  Who is the provider or what is the name of the office in which the patient attends annual eye exams?  If pt is not established with a provider, would they like to be referred to a provider to establish care? No .   Dental Screening: Recommended annual dental exams for proper oral hygiene  Community Resource Referral / Chronic Care Management: CRR required this visit?  No   CCM required this visit?  No      Plan:     I have personally reviewed and noted the following in the patient's chart:   Medical and social history Use of alcohol, tobacco or illicit drugs  Current medications and supplements including opioid prescriptions. Patient is  not currently taking opioid prescriptions. Functional ability and status Nutritional status Physical activity Advanced directives List of other physicians Hospitalizations, surgeries, and ER visits in previous 12  months Vitals Screenings to include cognitive, depression, and falls Referrals and appointments  In addition, I have reviewed and discussed with patient certain preventive protocols, quality metrics, and best practice recommendations. A written personalized care plan for preventive services as well as general preventive health recommendations were provided to patient.     Renato Gails, [place]   11/06/2021   Nurse Notes:  Mr. Meter , Thank you for taking time to come for your Medicare Wellness Visit. I appreciate your ongoing commitment to your health goals. Please review the following plan we discussed and let me know if I can assist you in the future.   These are the goals we discussed:  Goals   None     This is a list of the screening recommended for you and due dates:  Health Maintenance  Topic Date Due   Hepatitis C Screening: USPSTF Recommendation to screen - Ages 30-79 yo.  Never done   Zoster (Shingles) Vaccine (2 of 2) 07/05/2014   Flu Shot  11/11/2021   Colon Cancer Screening  11/19/2024   Tetanus Vaccine  02/11/2031   Pneumonia Vaccine  Completed   COVID-19 Vaccine  Completed   HPV Vaccine  Aged Out   I have reviewed and agreed to the above documentation.   Theresia Lo, FNP

## 2022-01-13 ENCOUNTER — Ambulatory Visit (INDEPENDENT_AMBULATORY_CARE_PROVIDER_SITE_OTHER): Payer: Medicare Other | Admitting: *Deleted

## 2022-01-13 DIAGNOSIS — Z23 Encounter for immunization: Secondary | ICD-10-CM | POA: Diagnosis not present

## 2022-01-22 ENCOUNTER — Inpatient Hospital Stay: Payer: Medicare Other

## 2022-01-22 ENCOUNTER — Inpatient Hospital Stay (HOSPITAL_BASED_OUTPATIENT_CLINIC_OR_DEPARTMENT_OTHER): Payer: Medicare Other | Admitting: Oncology

## 2022-01-22 ENCOUNTER — Inpatient Hospital Stay: Payer: Medicare Other | Attending: Oncology

## 2022-01-22 ENCOUNTER — Encounter: Payer: Self-pay | Admitting: Oncology

## 2022-01-22 VITALS — BP 150/83 | HR 71 | Resp 18

## 2022-01-22 VITALS — BP 162/96 | HR 64 | Temp 97.6°F | Resp 18 | Wt 206.7 lb

## 2022-01-22 DIAGNOSIS — D751 Secondary polycythemia: Secondary | ICD-10-CM

## 2022-01-22 DIAGNOSIS — Z720 Tobacco use: Secondary | ICD-10-CM

## 2022-01-22 DIAGNOSIS — F17210 Nicotine dependence, cigarettes, uncomplicated: Secondary | ICD-10-CM | POA: Insufficient documentation

## 2022-01-22 LAB — CBC WITH DIFFERENTIAL/PLATELET
Abs Immature Granulocytes: 0.03 10*3/uL (ref 0.00–0.07)
Basophils Absolute: 0.1 10*3/uL (ref 0.0–0.1)
Basophils Relative: 1 %
Eosinophils Absolute: 0.2 10*3/uL (ref 0.0–0.5)
Eosinophils Relative: 2 %
HCT: 54.8 % — ABNORMAL HIGH (ref 39.0–52.0)
Hemoglobin: 17.7 g/dL — ABNORMAL HIGH (ref 13.0–17.0)
Immature Granulocytes: 0 %
Lymphocytes Relative: 13 %
Lymphs Abs: 1.1 10*3/uL (ref 0.7–4.0)
MCH: 29.2 pg (ref 26.0–34.0)
MCHC: 32.3 g/dL (ref 30.0–36.0)
MCV: 90.3 fL (ref 80.0–100.0)
Monocytes Absolute: 0.9 10*3/uL (ref 0.1–1.0)
Monocytes Relative: 10 %
Neutro Abs: 6.5 10*3/uL (ref 1.7–7.7)
Neutrophils Relative %: 74 %
Platelets: 218 10*3/uL (ref 150–400)
RBC: 6.07 MIL/uL — ABNORMAL HIGH (ref 4.22–5.81)
RDW: 20.9 % — ABNORMAL HIGH (ref 11.5–15.5)
WBC: 8.8 10*3/uL (ref 4.0–10.5)
nRBC: 0 % (ref 0.0–0.2)

## 2022-01-22 LAB — COMPREHENSIVE METABOLIC PANEL
ALT: 20 U/L (ref 0–44)
AST: 24 U/L (ref 15–41)
Albumin: 3.6 g/dL (ref 3.5–5.0)
Alkaline Phosphatase: 58 U/L (ref 38–126)
Anion gap: 9 (ref 5–15)
BUN: 12 mg/dL (ref 8–23)
CO2: 30 mmol/L (ref 22–32)
Calcium: 9 mg/dL (ref 8.9–10.3)
Chloride: 100 mmol/L (ref 98–111)
Creatinine, Ser: 0.67 mg/dL (ref 0.61–1.24)
GFR, Estimated: >60 mL/min (ref >60)
Glucose, Bld: 108 mg/dL — ABNORMAL HIGH (ref 70–99)
Potassium: 3.9 mmol/L (ref 3.5–5.1)
Sodium: 139 mmol/L (ref 135–145)
Total Bilirubin: 0.6 mg/dL (ref 0.3–1.2)
Total Protein: 7.3 g/dL (ref 6.5–8.1)

## 2022-01-22 NOTE — Progress Notes (Signed)
Hematology/Oncology Progress note [phone] [phone]      Patient Care Team: Cletis Athens, MD as PCP - General (Internal Medicine)  ASSESSMENT & PLAN:   Erythrocytosis Labs reviewed and discussed with patient. Continue monitor and proceed with phlebotomy if hematocrit is >-=52 Procedure phlebotomy today.  Tobacco abuse Discussed with patient about smoke cessation.  Encouraged his efforts. Patient follows with lung cancer screening program for annual CT chest low-dose  Orders Placed This Encounter  Procedures   CBC with Differential/Platelet    Standing Status:   Future    Standing Expiration Date:   01/23/2023   Follow-up 4 months lab MD Phleb -cbc  All questions were answered. The patient knows to call the clinic with any problems, questions or concerns.  Earlie Server, MD, PhD [facility] Hematology Oncology 01/22/2022   CHIEF COMPLAINTS/REASON FOR VISIT:  Follow up for erythrocytosis.  HISTORY OF PRESENTING ILLNESS:  Joshua Schmidt is a 71 y.o. male who was seen in consultation at the request of Cletis Athens, MD for evaluation of polycytosis/erythrocytosis Patient had lab work done with primary care provider and was found to have high hemoglobin.  Lab results are not available to me.  Patient was referred to heme-onc for further evaluation and discussion. Associated signs or symptoms: Denies weight loss, fever, chills, fatigue, night sweats.   Context:  Smoking history: 76.5 pack year smoking history, he reports that he quitted in the past and then smoke again Testosterone supplements: Denies History of blood clots: Denies Daytime somnolence: Denies Family history of polycythemia: Denies Denies any personal history of thrombosis, stroke, heart attack.  Denies any skin itchiness.  He is a retired Clinical biochemist, remains active.  Mainly works outdoors. Sleep apnea, not interested in using CPAP machine  Intermittent right ankle  swelling, varicose vein, previously seen by vascular surgeon and status post laser ablation of the right great saphenous vein on 05/05/2021.  Post laser ultrasound showed no DVT. Status post sclerotherapy.  INTERVAL HISTORY Joshua Schmidt is a 71 y.o. male who has above history reviewed by me today presents for follow up visit for management of erythrocytosis Patient smokes about 1 pack of cigarettes and sometimes he smokes cigars. He does not use CPAP machine Patient has no new complaints.  He feels well today.    Review of Systems  Constitutional:  Negative for appetite change, chills, diaphoresis, fatigue, fever and unexpected weight change.  HENT:   Negative for hearing loss, lump/mass, nosebleeds, sore throat and voice change.   Eyes:  Negative for eye problems and icterus.  Respiratory:  Negative for chest tightness, cough, hemoptysis, shortness of breath and wheezing.   Cardiovascular:  Positive for leg swelling. Negative for chest pain.  Gastrointestinal:  Negative for abdominal distention, abdominal pain, blood in stool, diarrhea, nausea and rectal pain.  Endocrine: Negative for hot flashes.  Genitourinary:  Negative for bladder incontinence, difficulty urinating, dysuria, frequency, hematuria and nocturia.   Musculoskeletal:  Negative for arthralgias, back pain, flank pain, gait problem and myalgias.  Skin:  Negative for itching and rash.  Neurological:  Negative for dizziness, gait problem, headaches, light-headedness, numbness and seizures.  Hematological:  Negative for adenopathy. Does not bruise/bleed easily.  Psychiatric/Behavioral:  Negative for confusion and decreased concentration. The patient is not nervous/anxious.     MEDICAL HISTORY:  Past Medical History:  Diagnosis Date   Erythrocytosis 12/01/2018   Hypertension    Myocardial infarction [facility])     SURGICAL HISTORY: Past Surgical History:  Procedure Laterality Date   APPENDECTOMY     COLONOSCOPY WITH  PROPOFOL N/A 11/20/2014   Procedure: COLONOSCOPY WITH PROPOFOL;  Surgeon: Christene Lye, MD;  Location: [facility] ENDOSCOPY;  Service: Endoscopy;  Laterality: N/A;   TONSILLECTOMY AND ADENOIDECTOMY      SOCIAL HISTORY: Social History   Socioeconomic History   Marital status: Married    Spouse name: Not on file   Number of children: Not on file   Years of education: Not on file   Highest education level: Not on file  Occupational History   Not on file  Tobacco Use   Smoking status: Every Day    Packs/day: 1.50    Years: 51.00    Total pack years: 76.50    Types: Cigarettes   Smokeless tobacco: Never   Tobacco comments:    1ppd currently  Substance and Sexual Activity   Alcohol use: No    Alcohol/week: 0.0 standard drinks of alcohol   Drug use: No   Sexual activity: Not on file  Other Topics Concern   Not on file  Social History Narrative   Not on file   Social Determinants of Health   Financial Resource Strain: Low Risk  (11/06/2021)   Overall Financial Resource Strain (CARDIA)    Difficulty of Paying Living Expenses: Not very hard  Food Insecurity: No Food Insecurity (11/06/2021)   Hunger Vital Sign    Worried About Running Out of Food in the Last Year: Never true    Ran Out of Food in the Last Year: Never true  Transportation Needs: No Transportation Needs (11/06/2021)   PRAPARE - Hydrologist (Medical): No    Lack of Transportation (Non-Medical): No  Physical Activity: Sufficiently Active (11/06/2021)   Exercise Vital Sign    Days of Exercise per Week: 3 days    Minutes of Exercise per Session: 90 min  Stress: No Stress Concern Present (11/06/2021)   [place]    Feeling of Stress : Only a little  Social Connections: Unknown (11/06/2021)   Social Connection and Isolation Panel [NHANES]    Frequency of Communication with Friends and Family: Three times a week     Frequency of Social Gatherings with Friends and Family: More than three times a week    Attends Religious Services: Not on file    Active Member of Clubs or Organizations: No    Attends Archivist Meetings: Never    Marital Status: Married  Human resources officer Violence: Not on file    FAMILY HISTORY: History reviewed. No pertinent family history.  ALLERGIES:  has No Known Allergies.  MEDICATIONS:  Current Outpatient Medications  Medication Sig Dispense Refill   atorvastatin (LIPITOR) 20 MG tablet TAKE 1 TABLET BY MOUTH  DAILY 90 tablet 3   hydrochlorothiazide (HYDRODIURIL) 25 MG tablet TAKE 1 TABLET BY MOUTH  DAILY 90 tablet 3   lisinopril (ZESTRIL) 20 MG tablet TAKE 1 TABLET BY MOUTH  DAILY 90 tablet 3   metoprolol tartrate (LOPRESSOR) 50 MG tablet TAKE 1 TABLET BY MOUTH  DAILY 90 tablet 3   No current facility-administered medications for this visit.     PHYSICAL EXAMINATION: ECOG PERFORMANCE STATUS: 0 - Asymptomatic Vitals:   01/22/22 1441  BP: (!) 162/96  Pulse: 64  Resp: 18  Temp: 97.6 F (36.4 C)   Filed Weights   01/22/22 1441  Weight: 206 lb 11.2 oz (93.8 kg)  Physical Exam Constitutional:      General: He is not in acute distress. HENT:     Head: Normocephalic and atraumatic.  Eyes:     General: No scleral icterus. Cardiovascular:     Rate and Rhythm: Normal rate.  Pulmonary:     Effort: Pulmonary effort is normal. No respiratory distress.     Breath sounds: No wheezing.  Abdominal:     General: There is no distension.     Palpations: Abdomen is soft.  Musculoskeletal:        General: No deformity. Normal range of motion.     Cervical back: Normal range of motion and neck supple.  Skin:    General: Skin is warm and dry.     Findings: No erythema.  Neurological:     Mental Status: He is alert and oriented to person, place, and time. Mental status is at baseline.     Cranial Nerves: No cranial nerve deficit.  Psychiatric:        Mood  and Affect: Mood normal.     RADIOGRAPHIC STUDIES: I have personally reviewed the radiological images as listed and agreed with the findings in the report. No results found.   LABORATORY DATA:  I have reviewed the data as listed    Latest Ref Rng & Units 01/22/2022    2:24 PM 10/22/2021    1:26 PM 07/22/2021   12:33 PM  CBC  WBC 4.0 - 10.5 K/uL 8.8  8.1    Hemoglobin 13.0 - 17.0 g/dL 17.7  16.0  16.6   Hematocrit 39.0 - 52.0 % 54.8  50.3  50.3   Platelets 150 - 400 K/uL 218  205        Latest Ref Rng & Units 01/22/2022    2:24 PM 10/21/2021   11:45 AM 08/07/2020    1:28 PM  CMP  Glucose 70 - 99 mg/dL 108  96  117   BUN 8 - 23 mg/dL '12  12  14   '$ Creatinine 0.61 - 1.24 mg/dL 0.67  0.78  0.74   Sodium 135 - 145 mmol/L 139  139  133   Potassium 3.5 - 5.1 mmol/L 3.9  4.8  4.2   Chloride 98 - 111 mmol/L 100  101  95   CO2 22 - 32 mmol/L '30  28  29   '$ Calcium 8.9 - 10.3 mg/dL 9.0  8.6  8.7   Total Protein 6.5 - 8.1 g/dL 7.3  7.2  7.1   Total Bilirubin 0.3 - 1.2 mg/dL 0.6  0.3  0.8   Alkaline Phos 38 - 126 U/L 58   54   AST 15 - 41 U/L '24  21  18   '$ ALT 0 - 44 U/L '20  19  16     '$ Iron/TIBC/Ferritin/ %Sat    Component Value Date/Time   IRON 27 (L) 10/22/2021 1326   TIBC 428 10/22/2021 1326   FERRITIN 8 (L) 10/22/2021 1326   IRONPCTSAT 6 (L) 10/22/2021 1326

## 2022-01-22 NOTE — Patient Instructions (Signed)

## 2022-01-22 NOTE — Assessment & Plan Note (Signed)
Discussed with patient about smoke cessation.  Encouraged his efforts. Patient follows with lung cancer screening program for annual CT chest low-dose 

## 2022-01-22 NOTE — Progress Notes (Signed)
Removed 500 ml of bloodper phlebotomy orders from [facility]. Pt tolerated procedure well. VSS. Feeling well at time of discharge.

## 2022-01-22 NOTE — Assessment & Plan Note (Signed)
Labs reviewed and discussed with patient. Continue monitor and proceed with phlebotomy if hematocrit is >-=52 Procedure phlebotomy today 

## 2022-02-03 ENCOUNTER — Ambulatory Visit
Admission: RE | Admit: 2022-02-03 | Discharge: 2022-02-03 | Disposition: A | Discharge status: Home / Self Care | Payer: Medicare Other | Source: Ambulatory Visit | Attending: Acute Care | Admitting: Acute Care

## 2022-02-03 DIAGNOSIS — F17210 Nicotine dependence, cigarettes, uncomplicated: Secondary | ICD-10-CM | POA: Insufficient documentation

## 2022-02-03 DIAGNOSIS — Z87891 Personal history of nicotine dependence: Secondary | ICD-10-CM | POA: Diagnosis not present

## 2022-02-05 ENCOUNTER — Other Ambulatory Visit: Payer: Self-pay | Admitting: *Deleted

## 2022-02-05 DIAGNOSIS — F17210 Nicotine dependence, cigarettes, uncomplicated: Secondary | ICD-10-CM

## 2022-02-05 DIAGNOSIS — Z87891 Personal history of nicotine dependence: Secondary | ICD-10-CM

## 2022-02-05 DIAGNOSIS — Z122 Encounter for screening for malignant neoplasm of respiratory organs: Secondary | ICD-10-CM

## 2022-02-09 ENCOUNTER — Encounter: Payer: Self-pay | Admitting: Internal Medicine

## 2022-02-09 ENCOUNTER — Ambulatory Visit (INDEPENDENT_AMBULATORY_CARE_PROVIDER_SITE_OTHER): Payer: Medicare Other | Admitting: Internal Medicine

## 2022-02-09 ENCOUNTER — Encounter (INDEPENDENT_AMBULATORY_CARE_PROVIDER_SITE_OTHER): Payer: Self-pay

## 2022-02-09 VITALS — BP 139/89 | HR 61 | Ht 75.0 in | Wt 201.8 lb

## 2022-02-09 DIAGNOSIS — I7121 Aneurysm of the ascending aorta, without rupture: Secondary | ICD-10-CM

## 2022-02-09 DIAGNOSIS — Z720 Tobacco use: Secondary | ICD-10-CM | POA: Diagnosis not present

## 2022-02-09 DIAGNOSIS — F1010 Alcohol abuse, uncomplicated: Secondary | ICD-10-CM | POA: Diagnosis not present

## 2022-02-09 DIAGNOSIS — S46012A Strain of muscle(s) and tendon(s) of the rotator cuff of left shoulder, initial encounter: Secondary | ICD-10-CM | POA: Insufficient documentation

## 2022-02-09 DIAGNOSIS — S46012D Strain of muscle(s) and tendon(s) of the rotator cuff of left shoulder, subsequent encounter: Secondary | ICD-10-CM

## 2022-02-09 NOTE — Assessment & Plan Note (Signed)
Refer to orthopedics 

## 2022-02-09 NOTE — Assessment & Plan Note (Signed)
Advised to cut down on drinking

## 2022-02-09 NOTE — Assessment & Plan Note (Signed)
-   I instructed the patient to stop smoking and provided them with smoking cessation materials.  - I informed the patient that smoking puts them at increased risk for cancer, COPD, hypertension, and more.  - Informed the patient to seek help if they begin to have trouble breathing, develop chest pain, start to cough up blood, feel faint, or pass out.  

## 2022-02-09 NOTE — Assessment & Plan Note (Signed)
Patient is referred to thoracic surgeon

## 2022-02-09 NOTE — Progress Notes (Signed)
Established Patient Office Visit  Subjective:  Patient ID: Joshua Schmidt, male    DOB: January 11, 1951  Age: 71 y.o. MRN: [id number]  CC:  Chief Complaint  Patient presents with   Follow-up    Ct scan results     Shoulder Pain  The pain is present in the left shoulder. This is a new problem. The current episode started more than 1 month ago. The problem occurs 2 to 4 times per day. The pain is moderate. The symptoms are aggravated by activity. He has tried OTC pain meds for the symptoms. The treatment provided mild relief. Family history does not include gout or rheumatoid arthritis. His past medical history is significant for osteoarthritis. There is no history of diabetes, gout or rheumatoid arthritis.    Joshua Schmidt presents for lt shoulder pain  Past Medical History:  Diagnosis Date   Erythrocytosis 12/01/2018   Hypertension    Myocardial infarction [facility])     Past Surgical History:  Procedure Laterality Date   APPENDECTOMY     COLONOSCOPY WITH PROPOFOL N/A 11/20/2014   Procedure: COLONOSCOPY WITH PROPOFOL;  Surgeon: Christene Lye, MD;  Location: [facility] ENDOSCOPY;  Service: Endoscopy;  Laterality: N/A;   TONSILLECTOMY AND ADENOIDECTOMY      History reviewed. No pertinent family history.  Social History   Socioeconomic History   Marital status: Married    Spouse name: Not on file   Number of children: Not on file   Years of education: Not on file   Highest education level: Not on file  Occupational History   Not on file  Tobacco Use   Smoking status: Every Day    Packs/day: 1.50    Years: 51.00    Total pack years: 76.50    Types: Cigarettes   Smokeless tobacco: Never   Tobacco comments:    1ppd currently  Substance and Sexual Activity   Alcohol use: No    Alcohol/week: 0.0 standard drinks of alcohol   Drug use: No   Sexual activity: Not on file  Other Topics Concern   Not on file  Social History Narrative   Not on file   Social Determinants of  Health   Financial Resource Strain: Low Risk  (11/06/2021)   Overall Financial Resource Strain (CARDIA)    Difficulty of Paying Living Expenses: Not very hard  Food Insecurity: No Food Insecurity (11/06/2021)   Hunger Vital Sign    Worried About Running Out of Food in the Last Year: Never true    Ran Out of Food in the Last Year: Never true  Transportation Needs: No Transportation Needs (11/06/2021)   PRAPARE - Hydrologist (Medical): No    Lack of Transportation (Non-Medical): No  Physical Activity: Sufficiently Active (11/06/2021)   Exercise Vital Sign    Days of Exercise per Week: 3 days    Minutes of Exercise per Session: 90 min  Stress: No Stress Concern Present (11/06/2021)   [place]    Feeling of Stress : Only a little  Social Connections: Unknown (11/06/2021)   Social Connection and Isolation Panel [NHANES]    Frequency of Communication with Friends and Family: Three times a week    Frequency of Social Gatherings with Friends and Family: More than three times a week    Attends Religious Services: Not on file    Active Member of Clubs or Organizations: No    Attends Club or  Organization Meetings: Never    Marital Status: Married  Human resources officer Violence: Not on file     Current Outpatient Medications:    atorvastatin (LIPITOR) 20 MG tablet, TAKE 1 TABLET BY MOUTH  DAILY, Disp: 90 tablet, Rfl: 3   hydrochlorothiazide (HYDRODIURIL) 25 MG tablet, TAKE 1 TABLET BY MOUTH  DAILY, Disp: 90 tablet, Rfl: 3   lisinopril (ZESTRIL) 20 MG tablet, TAKE 1 TABLET BY MOUTH  DAILY, Disp: 90 tablet, Rfl: 3   metoprolol tartrate (LOPRESSOR) 50 MG tablet, TAKE 1 TABLET BY MOUTH  DAILY, Disp: 90 tablet, Rfl: 3   No Known Allergies  ROS Review of Systems  Constitutional: Negative.   HENT: Negative.    Eyes: Negative.   Respiratory: Negative.    Cardiovascular: Negative.   Gastrointestinal:  Negative.   Endocrine: Negative.   Genitourinary: Negative.   Musculoskeletal: Negative.  Negative for gout.  Skin: Negative.   Allergic/Immunologic: Negative.   Neurological: Negative.   Hematological: Negative.   Psychiatric/Behavioral: Negative.    All other systems reviewed and are negative.     Objective:    Physical Exam Vitals reviewed.  Constitutional:      Appearance: Normal appearance.  HENT:     Mouth/Throat:     Mouth: Mucous membranes are moist.  Eyes:     Pupils: Pupils are equal, round, and reactive to light.  Neck:     Vascular: No carotid bruit.  Cardiovascular:     Rate and Rhythm: Normal rate and regular rhythm.     Pulses: Normal pulses.     Heart sounds: Normal heart sounds.     Comments: Ascending aorta aneurysm, left shoulder pain because of the injury 5 months ago- I instructed the patient to stop smoking and provided them with smoking cessation materials.  - I informed the patient that smoking puts them at increased risk for cancer, COPD, hypertension, and more.  - Informed the patient to seek help if they begin to have trouble breathing, develop chest pain, start to cough up blood, feel faint, or pass out.- I instructed the patient to stop smoking and provided them with smoking cessation materials.  - I informed the patient that smoking puts them at increased risk for cancer, COPD, hypertension, and more.  - Informed the patient to seek help if they begin to have trouble breathing, develop chest pain, start to cough up blood, feel faint, or pass out.- I instructed the patient to stop smoking and provided them with smoking cessation materials.  - I informed the patient that smoking puts them at increased risk for cancer, COPD, hypertension, and more.  - Informed the patient to seek help if they begin to have trouble breathing, develop chest pain, start to cough up blood, feel faint, or pass out.  Counseled patient on the dangers of tobacco use, advised  patient to stop smoking, and reviewed strategies to maximize success Smoking cessation instruction/counseling given:  counseled patient on the dangers of tobacco use, advised patient to stop smoking, and reviewed strategies to maximize success It is very important that pt quit smoking. There are various alternatives available to help with this difficult task, but first and foremost, pt must make a firm commitment and decision to quit. The nature of nicotine addiction is discussed. The usefulness of behavioral therapy is discussed and suggested.  The correct use, cost and side effects of nicotine replacement therapy such as gum or patches is discussed. Bupropion and its cost (sometimes not covered fully by insurance) and  side effects are reviewed. The quit rates are discussed. I recommend pt not allow potential costs of treatment to deter ptfrom using nicotine replacement therapy or bupropion, as the long term economic and health benefits are obvious. Pulmonary:     Effort: Pulmonary effort is normal.     Breath sounds: Normal breath sounds.  Abdominal:     General: Bowel sounds are normal.     Palpations: Abdomen is soft. There is no hepatomegaly, splenomegaly or mass.     Tenderness: There is no abdominal tenderness.     Hernia: No hernia is present.  Musculoskeletal:     Cervical back: Neck supple.     Right lower leg: No edema.     Left lower leg: No edema.  Skin:    Findings: No rash.  Neurological:     Mental Status: He is alert and oriented to person, place, and time.     Motor: No weakness.  Psychiatric:        Mood and Affect: Mood normal.        Behavior: Behavior normal.     BP 139/89   Pulse 61   Ht _0  (1.905 m)   Wt 201 lb 12.8 oz (91.5 kg)   BMI 25.22 kg/m  Wt Readings from Last 3 Encounters:  02/09/22 201 lb 12.8 oz (91.5 kg)  01/22/22 206 lb 11.2 oz (93.8 kg)  11/04/21 205 lb 4.8 oz (93.1 kg)     Health Maintenance Due  Topic Date Due   Hepatitis C Screening   Never done   Zoster Vaccines- Shingrix (2 of 2) 07/05/2014   COVID-19 Vaccine (6 - Pfizer series) 05/08/2021    There are no preventive care reminders to display for this patient.  Lab Results  Component Value Date   TSH 0.69 10/21/2021   Lab Results  Component Value Date   WBC 8.8 01/22/2022   HGB 17.7 (H) 01/22/2022   HCT 54.8 (H) 01/22/2022   MCV 90.3 01/22/2022   PLT 218 01/22/2022   Lab Results  Component Value Date   NA 139 01/22/2022   K 3.9 01/22/2022   CO2 30 01/22/2022   GLUCOSE 108 (H) 01/22/2022   BUN 12 01/22/2022   CREATININE 0.67 01/22/2022   BILITOT 0.6 01/22/2022   ALKPHOS 58 01/22/2022   AST 24 01/22/2022   ALT 20 01/22/2022   PROT 7.3 01/22/2022   ALBUMIN 3.6 01/22/2022   CALCIUM 9.0 01/22/2022   ANIONGAP 9 01/22/2022   EGFR 95 10/21/2021   Lab Results  Component Value Date   CHOL 133 10/21/2021   Lab Results  Component Value Date   HDL 69 10/21/2021   Lab Results  Component Value Date   LDLCALC 50 10/21/2021   Lab Results  Component Value Date   TRIG 63 10/21/2021   Lab Results  Component Value Date   CHOLHDL 1.9 10/21/2021   No results found for: "HGBA1C"    Assessment & Plan:   Problem List Items Addressed This Visit       Cardiovascular and Mediastinum   Aneurysm of ascending aorta without rupture [facility])    Patient is referred to thoracic surgeon        Musculoskeletal and Integument   Traumatic tear of left rotator cuff    Refer to orthopedics        Other   Tobacco abuse - Primary (Chronic)    - I instructed the patient to stop smoking and provided them with smoking cessation  materials.  - I informed the patient that smoking puts them at increased risk for cancer, COPD, hypertension, and more.  - Informed the patient to seek help if they begin to have trouble breathing, develop chest pain, start to cough up blood, feel faint, or pass out.      Alcohol abuse    Advised to cut down on drinking       No  orders of the defined types were placed in this encounter.   Follow-up: No follow-ups on file.    Cletis Athens, MD

## 2022-02-10 NOTE — Addendum Note (Signed)
Addended by: Lacretia Nicks L on: 02/10/2022 01:11 PM   Modules accepted: Orders

## 2022-02-17 ENCOUNTER — Encounter: Payer: Self-pay | Admitting: Cardiovascular Disease

## 2022-02-17 ENCOUNTER — Ambulatory Visit: Payer: Medicare Other | Attending: Cardiovascular Disease | Admitting: Cardiovascular Disease

## 2022-02-17 VITALS — BP 136/84 | HR 69 | Ht 75.0 in | Wt 206.1 lb

## 2022-02-17 DIAGNOSIS — Z720 Tobacco use: Secondary | ICD-10-CM

## 2022-02-17 DIAGNOSIS — Z136 Encounter for screening for cardiovascular disorders: Secondary | ICD-10-CM | POA: Diagnosis not present

## 2022-02-17 DIAGNOSIS — I10 Essential (primary) hypertension: Secondary | ICD-10-CM | POA: Diagnosis not present

## 2022-02-17 DIAGNOSIS — I7121 Aneurysm of the ascending aorta, without rupture: Secondary | ICD-10-CM

## 2022-02-17 DIAGNOSIS — E785 Hyperlipidemia, unspecified: Secondary | ICD-10-CM | POA: Diagnosis not present

## 2022-02-17 DIAGNOSIS — R0602 Shortness of breath: Secondary | ICD-10-CM | POA: Diagnosis not present

## 2022-02-17 NOTE — Progress Notes (Signed)
Cardiology Office Note   Date:  02/17/2022   ID:  Joshua Schmidt, DOB Apr 12, 1951, MRN [id number]  PCP:  Joshua Athens, MD  Cardiologist:   Joshua Sacramento, MD   Chief Complaint  Patient presents with   Other    AAA no complaints today. Meds reviewed verbally with pt.      History of Present Illness: Joshua Schmidt is a 71 y.o. male who was referred by Dr. Lavera Schmidt for evaluation of ascending aortic aneurysm.  The patient reports possibly having myocardial infarction at home years ago but did not seek medical attention.  He has chronic medical conditions that include essential hypertension, hyperlipidemia, tobacco use and COPD.  He has no family history of aortic aneurysm or coronary artery disease.  He used to smoke 1-1/2 pack/day but has been trying to cut down and is currently down to half a pack per day. He has been getting CT scan of the lungs for cancer screening and was noted to have dilated ascending aorta on recent imaging measured at 4.7 cm.  In addition, he was noted to have aortic and coronary calcifications and calcified mitral valve annulus.  In addition, his pulmonary artery was noted to be dilated.  He denies chest pain but does have chronic exertional dyspnea. He also has history of chronic venous insufficiency with normal ABI.    Past Medical History:  Diagnosis Date   Erythrocytosis 12/01/2018   Hypertension    Myocardial infarction Joshua Schmidt)     Past Surgical History:  Procedure Laterality Date   APPENDECTOMY     COLONOSCOPY WITH PROPOFOL N/A 11/20/2014   Procedure: COLONOSCOPY WITH PROPOFOL;  Surgeon: Joshua Lye, MD;  Location: [facility] ENDOSCOPY;  Service: Endoscopy;  Laterality: N/A;   TONSILLECTOMY AND ADENOIDECTOMY       Current Outpatient Medications  Medication Sig Dispense Refill   aspirin 81 MG chewable tablet Chew 81 mg by mouth daily.     atorvastatin (LIPITOR) 20 MG tablet TAKE 1 TABLET BY MOUTH  DAILY 90 tablet 3   hydrochlorothiazide  (HYDRODIURIL) 25 MG tablet TAKE 1 TABLET BY MOUTH  DAILY 90 tablet 3   lisinopril (ZESTRIL) 20 MG tablet TAKE 1 TABLET BY MOUTH  DAILY 90 tablet 3   metoprolol tartrate (LOPRESSOR) 50 MG tablet TAKE 1 TABLET BY MOUTH  DAILY 90 tablet 3   No current facility-administered medications for this visit.    Allergies:   Patient has no known allergies.    Social History:  The patient  reports that he has been smoking cigarettes. He has a 76.50 pack-year smoking history. He has never used smokeless tobacco. He reports current alcohol use. He reports that he does not use drugs.   Family History:  The patient's family history is not on file.    ROS:  Please see the history of present illness.   Otherwise, review of systems are positive for none.   All other systems are reviewed and negative.    PHYSICAL EXAM: VS:  BP 136/84 (BP Location: Right Arm, Patient Position: Sitting, Cuff Size: Normal)   Pulse 69   Ht '6\' 3"'$  (1.905 m)   Wt 206 lb 2 oz (93.5 kg)   SpO2 96%   BMI 25.76 kg/m  , BMI Body mass index is 25.76 kg/m. GEN: Well nourished, well developed, in no acute distress  HEENT: normal  Neck: no JVD, carotid bruits, or masses Cardiac: RRR; no  rubs, or gallops,no edema .  1 out of  6 systolic murmur in the aortic area. Respiratory:  clear to auscultation bilaterally, normal work of breathing GI: soft, nontender, nondistended, + BS MS: no deformity or atrophy  Skin: warm and dry, no rash Neuro:  Strength and sensation are intact Psych: euthymic mood, full affect   EKG:  EKG is ordered today. The ekg ordered today demonstrates sinus rhythm with left axis deviation and possible old inferior infarct.  T wave changes in the anterior leads suggestive of ischemia.   Recent Labs: 10/21/2021: TSH 0.69 01/22/2022: ALT 20; BUN 12; Creatinine, Ser 0.67; Hemoglobin 17.7; Platelets 218; Potassium 3.9; Sodium 139    Lipid Panel    Component Value Date/Time   CHOL 133 10/21/2021 1145   TRIG  63 10/21/2021 1145   HDL 69 10/21/2021 1145   CHOLHDL 1.9 10/21/2021 1145   LDLCALC 50 10/21/2021 1145      Wt Readings from Last 3 Encounters:  02/17/22 206 lb 2 oz (93.5 kg)  02/09/22 201 lb 12.8 oz (91.5 kg)  01/22/22 206 lb 11.2 oz (93.8 kg)          02/17/2022    1:54 PM  PAD Screen  Previous PAD dx? Yes  Previous surgical procedure? Yes  Pain with walking? No  Feet/toe relief with dangling? No  Painful, non-healing ulcers? No  Extremities discolored? No      ASSESSMENT AND PLAN:  1.  Ascending aortic aneurysm: This is below threshold for surgical repair.  He has no family history of aortic aneurysm.  We have to exclude bicuspid aortic valve.  I discussed with him the importance of controlling his risk factors.  Utility of genetic testing is low in this age group with this risk profile. We have to exclude the presence of abdominal aortic aneurysm considering his prolonged history of tobacco use.  I requested abdominal aortic ultrasound.  2.  Exertional dyspnea and abnormal EKG: I requested an echocardiogram.  Currently with no chest pain.  He might require ischemic cardiac work-up in the near future.  3.  Essential hypertension: Blood pressures controlled on current medications.  4.  Hyperlipidemia: Continue atorvastatin 20 mg once daily.  I reviewed most recent lipid profile which showed an LDL of 50 which is at target.  5.  Tobacco use: I discussed with him the importance of smoking cessation.   Disposition:   FU with me in 3 months  Signed,  Joshua Sacramento, MD  02/17/2022 2:30 PM    [place]

## 2022-02-17 NOTE — Patient Instructions (Signed)
Medication Instructions:  No changes *If you need a refill on your cardiac medications before your next appointment, please call your pharmacy*   Lab Work: None ordered If you have labs (blood work) drawn today and your tests are completely normal, you will receive your results only by: [place] (if you have MyChart) OR A paper copy in the mail If you have any lab test that is abnormal or we need to change your treatment, we will call you to review the results.   Testing/Procedures: Your physician has requested that you have an echocardiogram. Echocardiography is a painless test that uses sound waves to create images of your heart. It provides your doctor with information about the size and shape of your heart and how well your heart's chambers and valves are working.   You may receive an ultrasound enhancing agent through an IV if needed to better visualize your heart during the echo. This procedure takes approximately one hour.  There are no restrictions for this procedure.  This will take place at [facility] ([place]) [id number], [place]  Your physician has requested that you have an abdominal aorta duplex. During this test, an ultrasound is used to evaluate the aorta. Allow 30 minutes for this exam. Do not eat after midnight the day before and avoid carbonated beverages.   Follow-Up: At [facility], you and your health needs are our priority.  As part of our continuing mission to provide you with exceptional heart care, we have created designated Provider Care Teams.  These Care Teams include your primary Cardiologist (physician) and Advanced Practice Providers (APPs -  Physician Assistants and Nurse Practitioners) who all work together to provide you with the care you need, when you need it.  We recommend signing up for the patient portal called "MyChart".  Sign up information is provided on this After Visit Summary.  MyChart is used to  connect with patients for Virtual Visits (Telemedicine).  Patients are able to view lab/test results, encounter notes, upcoming appointments, etc.  Non-urgent messages can be sent to your provider as well.   To learn more about what you can do with MyChart, go to [email or web address].    Your next appointment:   3 month(s)  The format for your next appointment:   In Person  Provider:   You may see Dr. Fletcher Anon or one of the following Advanced Practice Providers on your designated Care Team:   Murray Hodgkins, NP Christell Faith, PA-C Cadence Kathlen Mody, PA-C Gerrie Nordmann, NP    Important Information About Sugar

## 2022-03-10 ENCOUNTER — Encounter: Payer: Self-pay | Admitting: Gastroenterology

## 2022-03-10 ENCOUNTER — Ambulatory Visit (INDEPENDENT_AMBULATORY_CARE_PROVIDER_SITE_OTHER): Payer: Medicare Other | Admitting: Gastroenterology

## 2022-03-10 VITALS — BP 176/99 | HR 62 | Temp 97.7°F | Ht 75.0 in | Wt 216.0 lb

## 2022-03-10 DIAGNOSIS — Z86010 Personal history of colonic polyps: Secondary | ICD-10-CM | POA: Diagnosis not present

## 2022-03-10 DIAGNOSIS — R790 Abnormal level of blood mineral: Secondary | ICD-10-CM | POA: Diagnosis not present

## 2022-03-10 MED ORDER — NA SULFATE-K SULFATE-MG SULF 17.5-3.13-1.6 GM/177ML PO SOLN: 1.0000 | Freq: Once | ORAL | 0 refills | Status: AC

## 2022-03-10 NOTE — Addendum Note (Signed)
Addended by: Lurlean Nanny on: 03/10/2022 03:35 PM   Modules accepted: Orders

## 2022-03-10 NOTE — Progress Notes (Signed)
Joshua Darby, MD [address]  [place]  [place], [place] [address]  Main: [phone]  Fax: [phone]    Gastroenterology Consultation  Referring Provider:     Cletis Athens, MD Primary Care Physician:  Cletis Athens, MD Primary Gastroenterologist:  Dr. Cephas Schmidt Reason for Consultation: Iron deficiency        HPI:   Joshua Schmidt is a 71 y.o. male referred by Dr. Cletis Athens, MD  for consultation & management of iron deficiency.  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  Followed by Dr. Tasia Catchings, undergoing serial phlebotomies.  He is found to have low serum ferritin of 8 in July.  His most recent therapeutic phlebotomy was in October 2023.  Patient reports doing well otherwise.  He does not have GI concerns.  He had tubular adenoma based on the colonoscopy in 2016.  He drinks about 3 beers daily on average Smokes 1 pack/day  NSAIDs: None  Antiplts/Anticoagulants/Anti thrombotics: None  GI Procedures:  Colonoscopy 2016 - Diverticulosis in the sigmoid colon. - One 3 mm polyp in the proximal ascending colon. Resected and retrieved. - The examination was otherwise normal on direct and retroflexion views. DIAGNOSIS:  A. COLON POLYP, PROXIMAL ASCENDING; HOT BIOPSY:  - TUBULAR ADENOMA WITH CAUTERY ARTIFACT.  - NEGATIVE FOR HIGH-GRADE DYSPLASIA AND MALIGNANCY.   Past Medical History:  Diagnosis Date   Erythrocytosis 12/01/2018   Hypertension    Myocardial infarction [facility])     Past Surgical History:  Procedure Laterality Date   APPENDECTOMY     COLONOSCOPY WITH PROPOFOL N/A 11/20/2014   Procedure: COLONOSCOPY WITH PROPOFOL;  Surgeon: Christene Lye, MD;  Location: [facility] ENDOSCOPY;  Service: Endoscopy;  Laterality: N/A;   TONSILLECTOMY AND ADENOIDECTOMY       Current Outpatient Medications:    aspirin 81 MG chewable tablet, Chew 81 mg by mouth daily., Disp: , Rfl:    atorvastatin (LIPITOR) 20 MG tablet, TAKE 1 TABLET BY MOUTH   DAILY, Disp: 90 tablet, Rfl: 3   hydrochlorothiazide (HYDRODIURIL) 25 MG tablet, TAKE 1 TABLET BY MOUTH  DAILY, Disp: 90 tablet, Rfl: 3   lisinopril (ZESTRIL) 20 MG tablet, TAKE 1 TABLET BY MOUTH  DAILY, Disp: 90 tablet, Rfl: 3   metoprolol tartrate (LOPRESSOR) 50 MG tablet, TAKE 1 TABLET BY MOUTH  DAILY, Disp: 90 tablet, Rfl: 3   Na Sulfate-K Sulfate-Mg Sulf 17.5-3.13-1.6 GM/177ML SOLN, Take 1 kit by mouth once for 1 dose., Disp: 354 mL, Rfl: 0   No family history on file.   Social History   Tobacco Use   Smoking status: Every Day    Packs/day: 1.50    Years: 51.00    Total pack years: 76.50    Types: Cigarettes   Smokeless tobacco: Never   Tobacco comments:    1ppd currently  Substance Use Topics   Alcohol use: Yes   Drug use: No    Allergies as of 03/10/2022   (No Known Allergies)    Review of Systems:    All systems reviewed and negative except where noted in HPI.   Physical Exam:  BP (!) 176/99 (BP Location: Left Arm, Patient Position: Sitting, Cuff Size: Normal)   Pulse 62   Temp 97.7 F (36.5 C) (Oral)   Ht _0  (1.905 m)   Wt 216 lb (98 kg)   BMI 27.00 kg/m  No LMP for male patient.  General:   Alert,  Well-developed, well-nourished, pleasant and cooperative in  NAD Head:  Normocephalic and atraumatic. Eyes:  Sclera clear, no icterus.   Conjunctiva pink. Ears:  Normal auditory acuity. Nose:  No deformity, discharge, or lesions. Mouth:  No deformity or lesions,oropharynx pink & moist. Neck:  Supple; no masses or thyromegaly. Lungs:  Respirations even and unlabored.  Clear throughout to auscultation.   No wheezes, crackles, or rhonchi. No acute distress. Heart:  Regular rate and rhythm; no murmurs, clicks, rubs, or gallops. Abdomen:  Normal bowel sounds. Soft, non-tender and non-distended without masses, hepatosplenomegaly or hernias noted.  No guarding or rebound tenderness.   Rectal: Not performed Msk:  Symmetrical without gross deformities. Good, equal  movement & strength bilaterally. Pulses:  Normal pulses noted. Extremities:  No clubbing or edema.  No cyanosis. Neurologic:  Alert and oriented x3;  grossly normal neurologically. Skin:  Intact without significant lesions or rashes. No jaundice. Psych:  Alert and cooperative. Normal mood and affect.  Imaging Studies: No abdominal imaging  Assessment and Plan:   Joshua Schmidt is a 71 y.o. male with history of tobacco use, hypertension, secondary to a cytosis on therapeutic phlebotomies is seen in consultation for low ferritin.  No evidence of anemia, low ferritin is secondary to therapeutic phlebotomies.  Recommend surveillance colonoscopy for history of tubular adenoma  I have discussed alternative options, risks & benefits,  which include, but are not limited to, bleeding, infection, perforation,respiratory complication & drug reaction.  The patient agrees with this plan & written consent will be obtained.     Follow up as needed   Joshua Darby, MD

## 2022-03-12 ENCOUNTER — Encounter: Payer: Self-pay | Admitting: Oncology

## 2022-03-19 ENCOUNTER — Ambulatory Visit: Payer: Medicare Other | Admitting: Certified Registered"

## 2022-03-19 ENCOUNTER — Encounter
Admission: RE | Disposition: A | Discharge status: Home / Self Care | Payer: Self-pay | Source: Ambulatory Visit | Attending: Gastroenterology

## 2022-03-19 ENCOUNTER — Ambulatory Visit
Admission: RE | Admit: 2022-03-19 | Discharge: 2022-03-19 | Disposition: A | Discharge status: Home / Self Care | Payer: Medicare Other | Source: Ambulatory Visit | Attending: Gastroenterology | Admitting: Gastroenterology

## 2022-03-19 ENCOUNTER — Encounter: Payer: Self-pay | Admitting: Gastroenterology

## 2022-03-19 DIAGNOSIS — Z9049 Acquired absence of other specified parts of digestive tract: Secondary | ICD-10-CM | POA: Insufficient documentation

## 2022-03-19 DIAGNOSIS — D124 Benign neoplasm of descending colon: Secondary | ICD-10-CM | POA: Insufficient documentation

## 2022-03-19 DIAGNOSIS — I252 Old myocardial infarction: Secondary | ICD-10-CM | POA: Diagnosis not present

## 2022-03-19 DIAGNOSIS — I10 Essential (primary) hypertension: Secondary | ICD-10-CM | POA: Diagnosis not present

## 2022-03-19 DIAGNOSIS — K5730 Diverticulosis of large intestine without perforation or abscess without bleeding: Secondary | ICD-10-CM | POA: Insufficient documentation

## 2022-03-19 DIAGNOSIS — Z86010 Personal history of colonic polyps: Secondary | ICD-10-CM | POA: Insufficient documentation

## 2022-03-19 DIAGNOSIS — F17210 Nicotine dependence, cigarettes, uncomplicated: Secondary | ICD-10-CM | POA: Diagnosis not present

## 2022-03-19 DIAGNOSIS — J449 Chronic obstructive pulmonary disease, unspecified: Secondary | ICD-10-CM | POA: Insufficient documentation

## 2022-03-19 DIAGNOSIS — Z860101 Personal history of adenomatous and serrated colon polyps: Secondary | ICD-10-CM

## 2022-03-19 DIAGNOSIS — Z1211 Encounter for screening for malignant neoplasm of colon: Secondary | ICD-10-CM | POA: Insufficient documentation

## 2022-03-19 DIAGNOSIS — K635 Polyp of colon: Secondary | ICD-10-CM | POA: Diagnosis not present

## 2022-03-19 HISTORY — PX: COLONOSCOPY WITH PROPOFOL: SHX5780

## 2022-03-19 SURGERY — COLONOSCOPY WITH PROPOFOL
Anesthesia: General

## 2022-03-19 MED ORDER — LIDOCAINE HCL (CARDIAC) PF 100 MG/5ML IV SOSY
PREFILLED_SYRINGE | INTRAVENOUS | Status: DC | PRN
  Administered 2022-03-19: 50 mg via INTRAVENOUS

## 2022-03-19 MED ORDER — SODIUM CHLORIDE 0.9 % IV SOLN
INTRAVENOUS | Status: DC
  Administered 2022-03-19: 1000 mL via INTRAVENOUS

## 2022-03-19 MED ORDER — PROPOFOL 1000 MG/100ML IV EMUL
INTRAVENOUS | Status: AC
  Filled 2022-03-19: qty 100

## 2022-03-19 MED ORDER — PROPOFOL 10 MG/ML IV BOLUS
INTRAVENOUS | Status: DC | PRN
  Administered 2022-03-19: 70 mg via INTRAVENOUS

## 2022-03-19 MED ORDER — DEXMEDETOMIDINE HCL IN NACL 200 MCG/50ML IV SOLN
INTRAVENOUS | Status: DC | PRN
  Administered 2022-03-19: 12 ug via INTRAVENOUS
  Administered 2022-03-19: 8 ug via INTRAVENOUS

## 2022-03-19 MED ORDER — PROPOFOL 500 MG/50ML IV EMUL
INTRAVENOUS | Status: DC | PRN
  Administered 2022-03-19: 150 ug/kg/min via INTRAVENOUS

## 2022-03-19 NOTE — Transfer of Care (Signed)
Immediate Anesthesia Transfer of Care Note  Patient: Joshua Schmidt  Procedure(s) Performed: COLONOSCOPY WITH PROPOFOL  Patient Location: PACU and Endoscopy Unit  Anesthesia Type:General  Level of Consciousness: awake and drowsy  Airway & Oxygen Therapy: Patient Spontanous Breathing  Post-op Assessment: Report given to RN and Post -op Vital signs reviewed and stable  Post vital signs: Reviewed and stable  Last Vitals:  Vitals Value Taken Time  BP 134/84 03/19/22 1319  Temp 36.1 C 03/19/22 1319  Pulse 78 03/19/22 1319  Resp 31 03/19/22 1319  SpO2 91 % 03/19/22 1319    Last Pain:  Vitals:   03/19/22 1319  TempSrc: Temporal  PainSc: Asleep         Complications: No notable events documented.

## 2022-03-19 NOTE — Anesthesia Procedure Notes (Signed)
Procedure Name: MAC Date/Time: 03/19/2022 12:45 PM  Performed by: Biagio Borg, CRNAPre-anesthesia Checklist: Patient identified, Emergency Drugs available, Suction available, Patient being monitored and Timeout performed Patient Re-evaluated:Patient Re-evaluated prior to induction Oxygen Delivery Method: Nasal cannula and Simple face mask Induction Type: IV induction Placement Confirmation: positive ETCO2 and CO2 detector

## 2022-03-19 NOTE — Anesthesia Postprocedure Evaluation (Signed)
Anesthesia Post Note  Patient: Joshua Schmidt  Procedure(s) Performed: COLONOSCOPY WITH PROPOFOL  Patient location during evaluation: Endoscopy Anesthesia Type: General Level of consciousness: awake and alert Pain management: pain level controlled Vital Signs Assessment: post-procedure vital signs reviewed and stable Respiratory status: spontaneous breathing, nonlabored ventilation, respiratory function stable and patient connected to nasal cannula oxygen Cardiovascular status: blood pressure returned to baseline and stable Postop Assessment: no apparent nausea or vomiting Anesthetic complications: no   No notable events documented.   Last Vitals:  Vitals:   03/19/22 1319 03/19/22 1329  BP: 134/84 (!) 161/106  Pulse: 78 77  Resp: (!) 31   Temp: (!) 36.1 C   SpO2: 91% 92%    Last Pain:  Vitals:   03/19/22 1329  TempSrc:   PainSc: 0-No pain                 Precious Haws Ladavia Lindenbaum

## 2022-03-19 NOTE — Op Note (Signed)
[facility] Gastroenterology Patient Name: Joshua Schmidt Procedure Date: 03/19/2022 12:43 PM MRN: [id number] Account #: [id number] Date of Birth: [birth year] Admit Type: Outpatient Age: 71 Room: [facility] ENDO ROOM 3 Gender: Male Note Status: Finalized Instrument Name: Jasper Riling [id number] Procedure:             Colonoscopy Indications:           Surveillance: Personal history of adenomatous polyps                         on last colonoscopy 5 years ago, Last colonoscopy:                         August 2016 Providers:             Lin Landsman MD, MD Referring MD:          Cletis Athens, MD (Referring MD) Medicines:             General Anesthesia Complications:         No immediate complications. Estimated blood loss: None. Procedure:             Pre-Anesthesia Assessment:                        - Prior to the procedure, a History and Physical was                         performed, and patient medications and allergies were                         reviewed. The patient is competent. The risks and                         benefits of the procedure and the sedation options and                         risks were discussed with the patient. All questions                         were answered and informed consent was obtained.                         Patient identification and proposed procedure were                         verified by the physician, the nurse, the                         anesthesiologist, the anesthetist and the technician                         in the pre-procedure area in the procedure room in the                         endoscopy suite. Mental Status Examination: alert and                         oriented. Airway Examination: normal oropharyngeal  airway and neck mobility. Respiratory Examination:                         clear to auscultation. CV Examination: normal.                         Prophylactic Antibiotics: The patient  does not require                         prophylactic antibiotics. Prior Anticoagulants: The                         patient has taken no anticoagulant or antiplatelet                         agents. ASA Grade Assessment: III - A patient with                         severe systemic disease. After reviewing the risks and                         benefits, the patient was deemed in satisfactory                         condition to undergo the procedure. The anesthesia                         plan was to use general anesthesia. Immediately prior                         to administration of medications, the patient was                         re-assessed for adequacy to receive sedatives. The                         heart rate, respiratory rate, oxygen saturations,                         blood pressure, adequacy of pulmonary ventilation, and                         response to care were monitored throughout the                         procedure. The physical status of the patient was                         re-assessed after the procedure.                        After obtaining informed consent, the colonoscope was                         passed under direct vision. Throughout the procedure,                         the patient's blood pressure, pulse, and oxygen  saturations were monitored continuously. The                         Colonoscope was introduced through the anus and                         advanced to the the terminal ileum, with                         identification of the appendiceal orifice and IC                         valve. The colonoscopy was performed without                         difficulty. The patient tolerated the procedure well.                         The quality of the bowel preparation was adequate to                         identify polyps greater than 5 mm in size. The                         terminal ileum, ileocecal valve, appendiceal  orifice,                         and rectum were photographed. Findings:      The perianal and digital rectal examinations were normal. Pertinent       negatives include normal sphincter tone and no palpable rectal lesions.      The terminal ileum appeared normal.      Two sessile polyps were found in the descending colon. The polyps were 5       to 8 mm in size. These polyps were removed with a cold snare. Resection       and retrieval were complete. Estimated blood loss: none.      A 10 mm polyp was found in the descending colon. The polyp was flat.       Preparations were made for mucosal resection. Demarcation of the lesion       was performed with narrow band imaging to clearly identify the       boundaries of the lesion. Saline was injected to raise the lesion. Snare       mucosal resection was performed. Resection and retrieval were complete.       Resected tissue including tissue margins will be examined by histology.       To prevent bleeding after mucosal resection, one hemostatic clip was       successfully placed (MR safe). Clip manufacturer: [employer].       There was no bleeding during, or at the end, of the procedure. Estimated       blood loss: none.      Multiple diverticula were found in the recto-sigmoid colon, sigmoid       colon and descending colon. There was no evidence of diverticular       bleeding.      The retroflexed view of the distal rectum and anal verge was normal and       showed no anal or  rectal abnormalities. Impression:            - The examined portion of the ileum was normal.                        - Two 5 to 8 mm polyps in the descending colon,                         removed with a cold snare. Resected and retrieved.                        - One 10 mm polyp in the descending colon, removed                         with mucosal resection. Resected and retrieved. Clip                         manufacturer: [employer]. Clip (MR safe) was                          placed.                        - Moderate diverticulosis in the recto-sigmoid colon,                         in the sigmoid colon and in the descending colon.                         There was no evidence of diverticular bleeding.                        - The distal rectum and anal verge are normal on                         retroflexion view.                        - Mucosal resection was performed. Resection and                         retrieval were complete. Recommendation:        - Discharge patient to home (with escort).                        - Resume previous diet today.                        - Continue present medications.                        - Await pathology results.                        - Repeat colonoscopy in 1 year for surveillance. Procedure Code(s):     --- Professional ---                        [phone], Colonoscopy, flexible; with endoscopic mucosal  resection                        [id number], 62, Colonoscopy, flexible; with removal of                         tumor(s), polyp(s), or other lesion(s) by snare                         technique Diagnosis Code(s):     --- Professional ---                        D12.4, Benign neoplasm of descending colon                        Z86.010, Personal history of colonic polyps                        K57.30, Diverticulosis of large intestine without                         perforation or abscess without bleeding CPT copyright 2022 American Medical Association. All rights reserved. The codes documented in this report are preliminary and upon coder review may  be revised to meet current compliance requirements. Dr. Ulyess Mort Lin Landsman MD, MD 03/19/2022 1:19:53 PM This report has been signed electronically. Number of Addenda: 0 Note Initiated On: 03/19/2022 12:43 PM Scope Withdrawal Time: 0 hours 22 minutes 50 seconds  Total Procedure Duration: 0 hours 25 minutes 53 seconds   Estimated Blood Loss:  Estimated blood loss: none.      [facility]

## 2022-03-19 NOTE — Anesthesia Preprocedure Evaluation (Signed)
Anesthesia Evaluation  Patient identified by MRN, date of birth, ID band Patient awake    Reviewed: Allergy & Precautions, NPO status , Patient's Chart, lab work & pertinent test results  History of Anesthesia Complications Negative for: history of anesthetic complications  Airway Mallampati: III  TM Distance: >3 FB Neck ROM: full    Dental  (+) Poor Dentition, Missing, Chipped   Pulmonary neg shortness of breath, COPD, Current Smoker   Pulmonary exam normal        Cardiovascular hypertension, (-) angina + Past MI  Normal cardiovascular exam     Neuro/Psych negative neurological ROS  negative psych ROS   GI/Hepatic negative GI ROS, Neg liver ROS,neg GERD  ,,  Endo/Other  negative endocrine ROS    Renal/GU negative Renal ROS  negative genitourinary   Musculoskeletal   Abdominal   Peds  Hematology negative hematology ROS (+)   Anesthesia Other Findings Past Medical History: 12/01/2018: Erythrocytosis No date: Hypertension No date: Myocardial infarction [facility])  Past Surgical History: No date: APPENDECTOMY 11/20/2014: COLONOSCOPY WITH PROPOFOL; N/A     Comment:  Procedure: COLONOSCOPY WITH PROPOFOL;  Surgeon:               Christene Lye, MD;  Location: [facility] ENDOSCOPY;                Service: Endoscopy;  Laterality: N/A; No date: TONSILLECTOMY AND ADENOIDECTOMY  BMI    Body Mass Index: 25.68 kg/m      Reproductive/Obstetrics negative OB ROS                             Anesthesia Physical Anesthesia Plan  ASA: 3  Anesthesia Plan: General   Post-op Pain Management:    Induction: Intravenous  PONV Risk Score and Plan: Propofol infusion and TIVA  Airway Management Planned: Natural Airway and Nasal Cannula  Additional Equipment:   Intra-op Plan:   Post-operative Plan:   Informed Consent: I have reviewed the patients History and Physical, chart, labs and discussed  the procedure including the risks, benefits and alternatives for the proposed anesthesia with the patient or authorized representative who has indicated his/her understanding and acceptance.     Dental Advisory Given  Plan Discussed with: Anesthesiologist, CRNA and Surgeon  Anesthesia Plan Comments: (Patient consented for risks of anesthesia including but not limited to:  - adverse reactions to medications - risk of airway placement if required - damage to eyes, teeth, lips or other oral mucosa - nerve damage due to positioning  - sore throat or hoarseness - Damage to heart, brain, nerves, lungs, other parts of body or loss of life  Patient voiced understanding.)       Anesthesia Quick Evaluation

## 2022-03-19 NOTE — H&P (Signed)
Cephas Darby, MD [address]  [place]  [place], [place] [address]  Main: [phone]  Fax: [phone] Pager: [phone]  Primary Care Physician:  Cletis Athens, MD Primary Gastroenterologist:  Dr. Cephas Darby  Pre-Procedure History & Physical: HPI:  Joshua Schmidt is a 71 y.o. male is here for an colonoscopy.   Past Medical History:  Diagnosis Date   Erythrocytosis 12/01/2018   Hypertension    Myocardial infarction [facility])     Past Surgical History:  Procedure Laterality Date   APPENDECTOMY     COLONOSCOPY WITH PROPOFOL N/A 11/20/2014   Procedure: COLONOSCOPY WITH PROPOFOL;  Surgeon: Christene Lye, MD;  Location: [facility] ENDOSCOPY;  Service: Endoscopy;  Laterality: N/A;   TONSILLECTOMY AND ADENOIDECTOMY      Prior to Admission medications   Medication Sig Start Date End Date Taking? Authorizing Provider  aspirin 81 MG chewable tablet Chew 81 mg by mouth daily.   Yes [provider]  atorvastatin (LIPITOR) 20 MG tablet TAKE 1 TABLET BY MOUTH  DAILY 09/11/21  Yes Masoud, Viann Shove, MD  hydrochlorothiazide (HYDRODIURIL) 25 MG tablet TAKE 1 TABLET BY MOUTH  DAILY 07/08/21  Yes Masoud, Viann Shove, MD  lisinopril (ZESTRIL) 20 MG tablet TAKE 1 TABLET BY MOUTH  DAILY 09/11/21  Yes Masoud, Viann Shove, MD  metoprolol tartrate (LOPRESSOR) 50 MG tablet TAKE 1 TABLET BY MOUTH  DAILY 09/11/21  Yes Cletis Athens, MD    Allergies as of 03/10/2022   (No Known Allergies)    History reviewed. No pertinent family history.  Social History   Socioeconomic History   Marital status: Married    Spouse name: Not on file   Number of children: Not on file   Years of education: Not on file   Highest education level: Not on file  Occupational History   Not on file  Tobacco Use   Smoking status: Every Day    Packs/day: 1.50    Years: 51.00    Total pack years: 76.50    Types: Cigarettes   Smokeless tobacco: Never   Tobacco comments:    1ppd currently  Vaping Use   Vaping Use:  Never used  Substance and Sexual Activity   Alcohol use: Yes   Drug use: No   Sexual activity: Not on file  Other Topics Concern   Not on file  Social History Narrative   Not on file   Social Determinants of Health   Financial Resource Strain: Low Risk  (11/06/2021)   Overall Financial Resource Strain (CARDIA)    Difficulty of Paying Living Expenses: Not very hard  Food Insecurity: No Food Insecurity (11/06/2021)   Hunger Vital Sign    Worried About Running Out of Food in the Last Year: Never true    Ran Out of Food in the Last Year: Never true  Transportation Needs: No Transportation Needs (11/06/2021)   PRAPARE - Hydrologist (Medical): No    Lack of Transportation (Non-Medical): No  Physical Activity: Sufficiently Active (11/06/2021)   Exercise Vital Sign    Days of Exercise per Week: 3 days    Minutes of Exercise per Session: 90 min  Stress: No Stress Concern Present (11/06/2021)   [place]    Feeling of Stress : Only a little  Social Connections: Unknown (11/06/2021)   Social Connection and Isolation Panel [NHANES]    Frequency of Communication with Friends and Family: Three times a week  Frequency of Social Gatherings with Friends and Family: More than three times a week    Attends Religious Services: Not on file    Active Member of Clubs or Organizations: No    Attends Archivist Meetings: Never    Marital Status: Married  Human resources officer Violence: Not on file    Review of Systems: See HPI, otherwise negative ROS  Physical Exam: BP (!) 163/103   Pulse 81   Temp 97.6 F (36.4 C) (Temporal)   Resp 18   Ht '6\' 3"'$  (1.905 m)   Wt 93.2 kg   SpO2 95%   BMI 25.68 kg/m  General:   Alert,  pleasant and cooperative in NAD Head:  Normocephalic and atraumatic. Neck:  Supple; no masses or thyromegaly. Lungs:  Clear throughout to auscultation.    Heart:  Regular  rate and rhythm. Abdomen:  Soft, nontender and nondistended. Normal bowel sounds, without guarding, and without rebound.   Neurologic:  Alert and  oriented x4;  grossly normal neurologically.  Impression/Plan: RAYWOOD WAILES is here for an colonoscopy to be performed for h/o tubular adenomas  Risks, benefits, limitations, and alternatives regarding  colonoscopy have been reviewed with the patient.  Questions have been answered.  All parties agreeable.   Sherri Sear, MD  03/19/2022, 11:39 AM

## 2022-03-20 ENCOUNTER — Encounter: Payer: Self-pay | Admitting: Gastroenterology

## 2022-03-20 LAB — SURGICAL PATHOLOGY

## 2022-03-31 IMAGING — CT CT CHEST LUNG CANCER SCREENING LOW DOSE W/O CM
2 of 5 series · 15 of 40 positions shown, 18 images · non-contrast
Comparison: 11/02/2019

CLINICAL DATA: Lung cancer screening. Current asymptomatic smoker
with 78 pack-year history.

EXAM:
CT CHEST WITHOUT CONTRAST LOW-DOSE FOR LUNG CANCER SCREENING
TECHNIQUE: Multidetector CT imaging of the chest was performed following the
standard protocol without IV contrast.

[Series 3: lung 1.00 · axial · 0.73mm/px · z∈[-1201,-876]mm · 12 of 359 slices shown, 15 images]
[im 17/359  mediastinal]
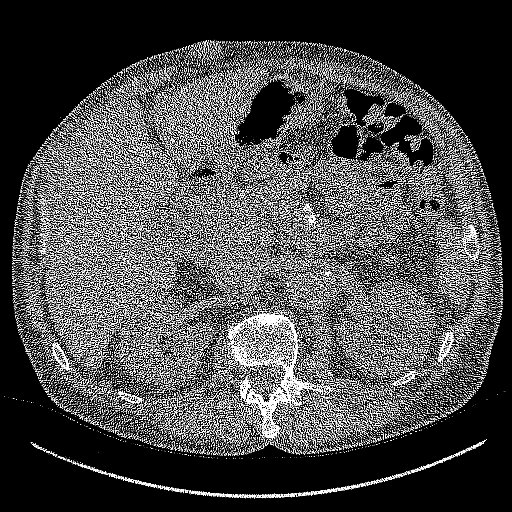
[im 17/359  lung]
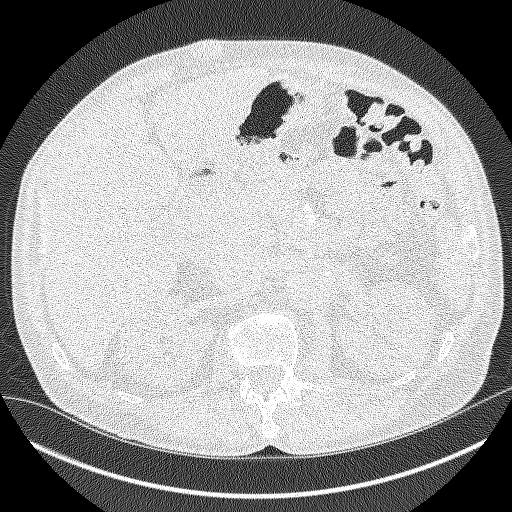
[im 49/359  lung]
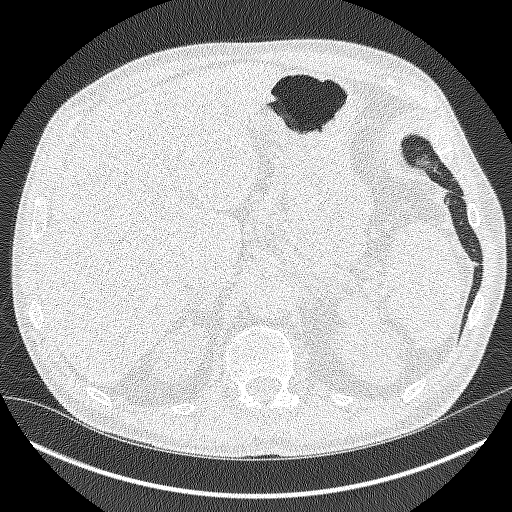
[im 82/359  lung]
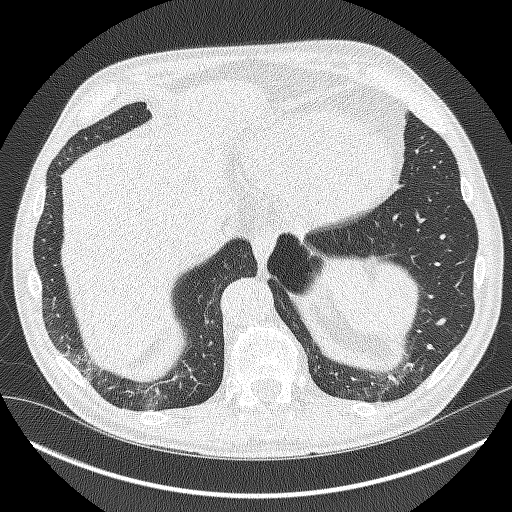
[im 114/359  lung]
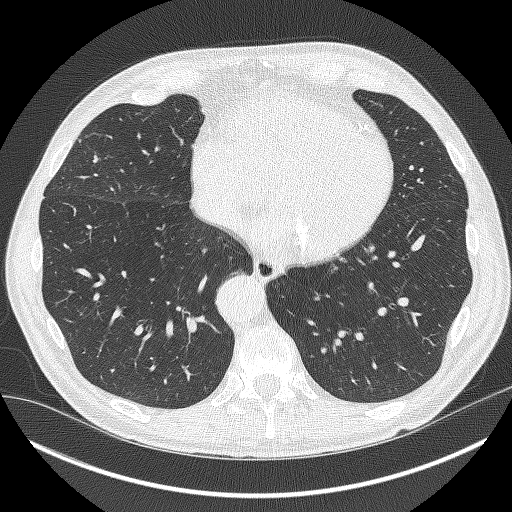
[im 131/359  mediastinal]
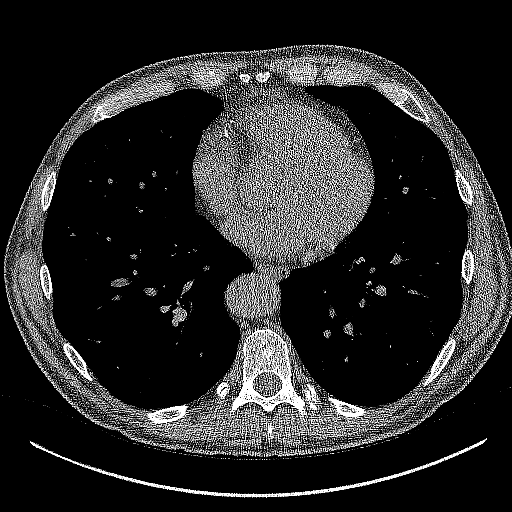
[im 131/359  lung]
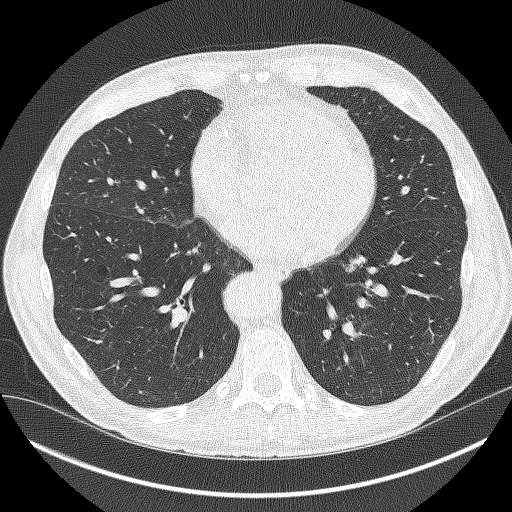
[im 163/359  lung]
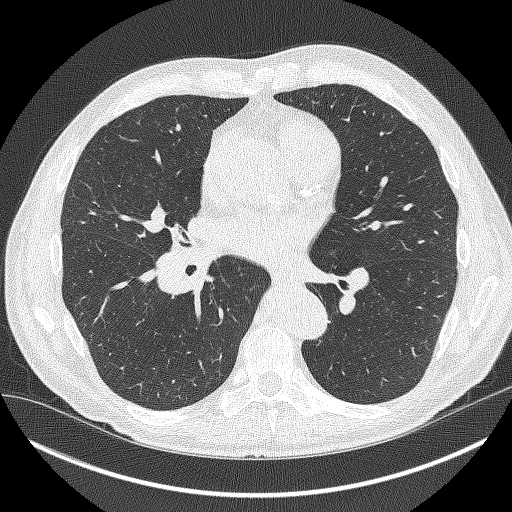
[im 196/359  lung]
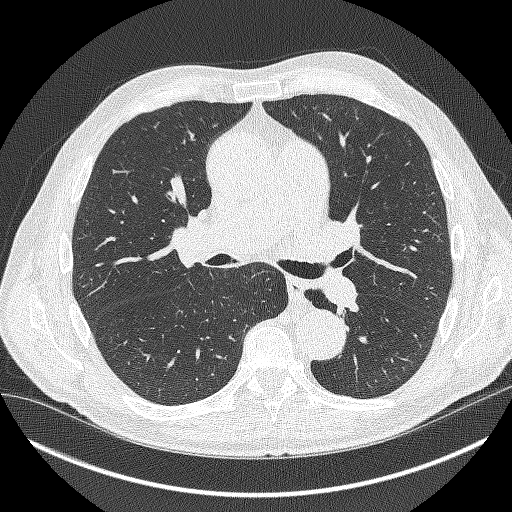
[im 228/359  lung]
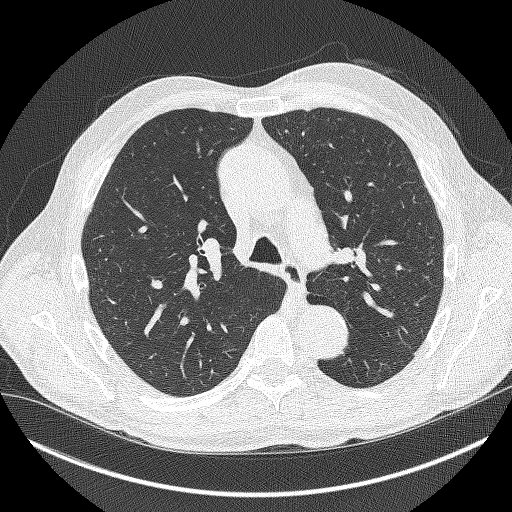
[im 245/359  mediastinal]
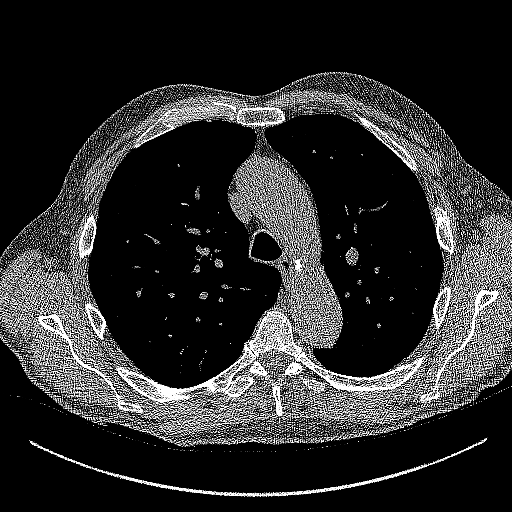
[im 245/359  lung]
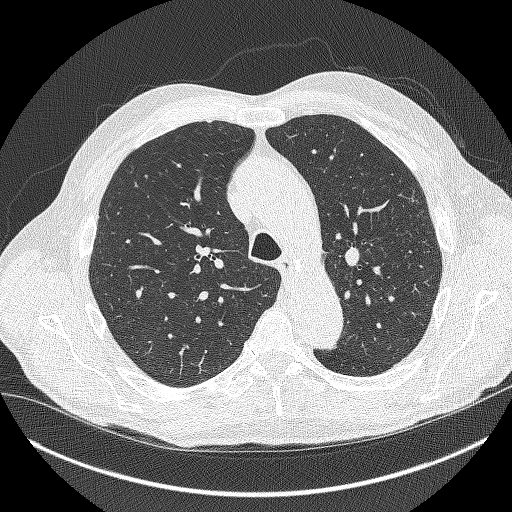
[im 277/359  lung]
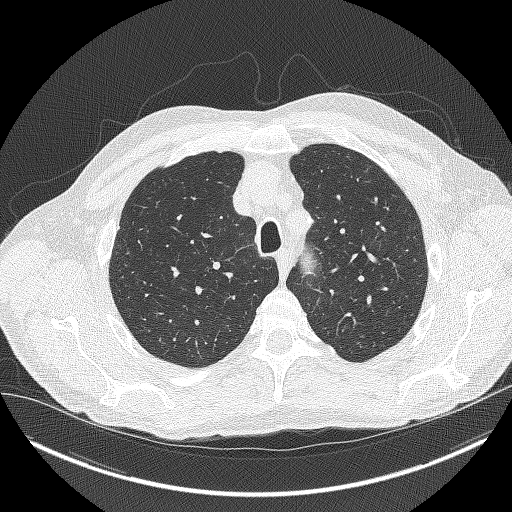
[im 310/359  lung]
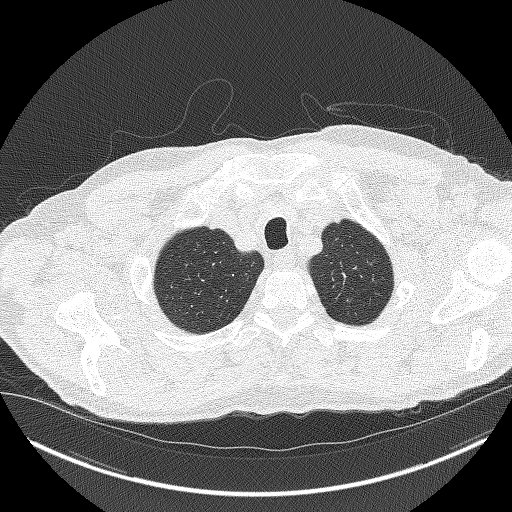
[im 342/359  lung]
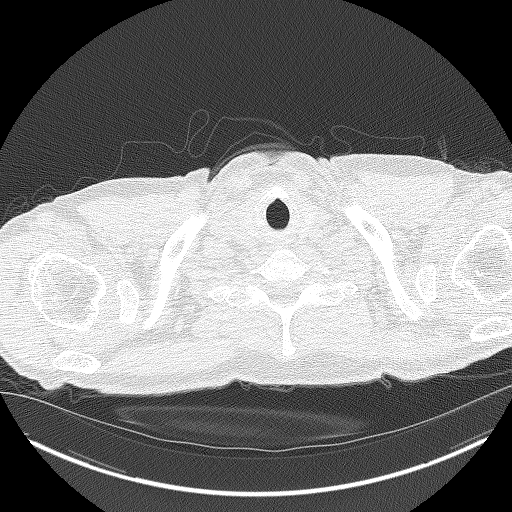

[Series 5: coronals lung 1.00 cor · coronal · 0.70mm/px · 3 of 302 slices shown]
[im 61/302  lung]
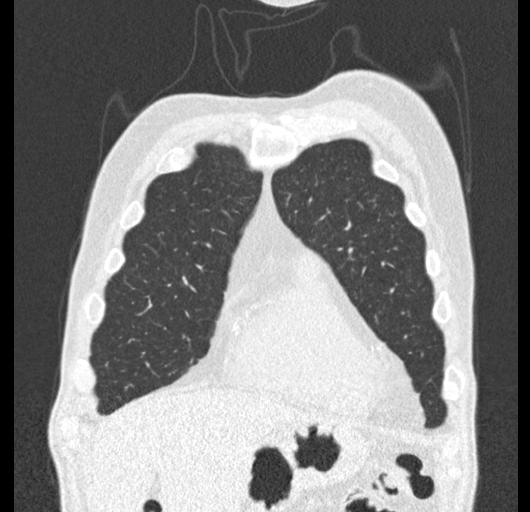
[im 121/302  lung]
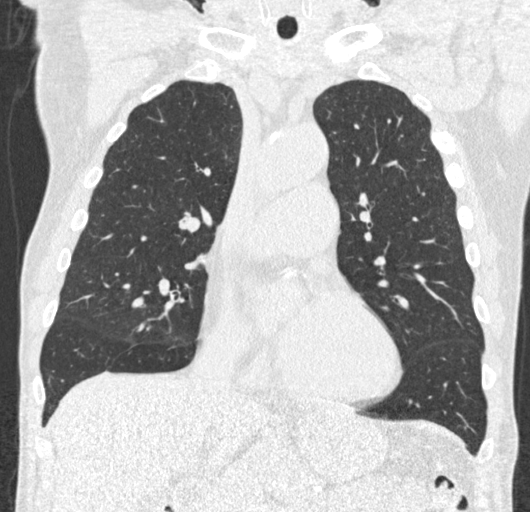
[im 181/302  lung]
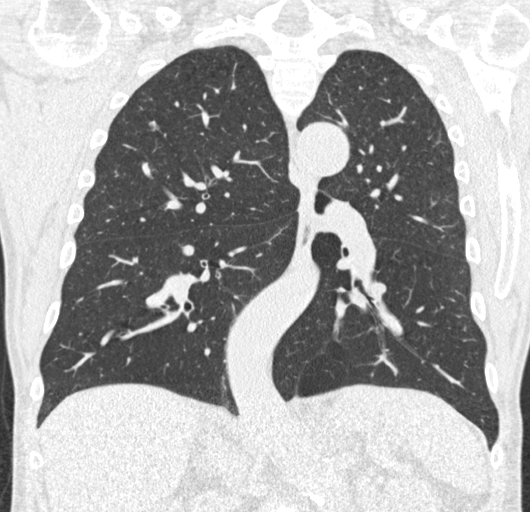

[15 of 40 positions shown; findings below may reference images not displayed]

FINDINGS: Cardiovascular: Normal heart size. Aortic atherosclerosis. Coronary
artery atherosclerotic calcifications. No pericardial effusion.

Mediastinum/Nodes: No enlarged mediastinal, hilar, or axillary lymph
nodes. Thyroid gland, trachea, and esophagus demonstrate no
significant findings.

Lungs/Pleura: Mild centrilobular emphysema. No pleural effusion,
airspace consolidation, or pneumothorax. Small pulmonary nodules are
again noted bilaterally. The largest is in the posteromedial right
lower lobe with a mean derived diameter 4.4 mm. No new or suspicious
lung nodules.

Upper Abdomen: No acute abnormality within the imaged portions of
the upper abdomen. Posterior cortex right kidney cyst measures
cm. Aortic atherosclerosis.

Musculoskeletal: No acute or suspicious osseous findings.
Degenerative changes within the right glenohumeral joint noted with
several loose bodies. Remote healed right third, fourth and fifth
rib fractures.
IMPRESSION: 1. Lung-RADS 2, benign appearance or behavior. Continue annual
screening with low-dose chest CT without contrast in 12 months.
2. Coronary artery calcifications.
3. Aortic Atherosclerosis ([id number]).

## 2022-04-13 ENCOUNTER — Other Ambulatory Visit: Payer: Self-pay | Admitting: Internal Medicine

## 2022-04-15 ENCOUNTER — Ambulatory Visit: Payer: Medicare Other | Attending: Cardiovascular Disease

## 2022-04-15 ENCOUNTER — Ambulatory Visit (INDEPENDENT_AMBULATORY_CARE_PROVIDER_SITE_OTHER): Payer: Medicare Other

## 2022-04-15 DIAGNOSIS — F17200 Nicotine dependence, unspecified, uncomplicated: Secondary | ICD-10-CM

## 2022-04-15 DIAGNOSIS — Z136 Encounter for screening for cardiovascular disorders: Secondary | ICD-10-CM

## 2022-04-15 DIAGNOSIS — R0602 Shortness of breath: Secondary | ICD-10-CM

## 2022-04-15 LAB — ECHOCARDIOGRAM COMPLETE
AR max vel: 3.61 cm2
AV Area VTI: 3.78 cm2
AV Area mean vel: 3.65 cm2
AV Mean grad: 2.5 mmHg
AV Peak grad: 5.3 mmHg
Ao pk vel: 1.15 m/s
Area-P 1/2: 2.1 cm2
Calc EF: 49.7 %
S' Lateral: 3.3 cm
Single Plane A2C EF: 50.2 %
Single Plane A4C EF: 44.3 %

## 2022-04-24 ENCOUNTER — Telehealth: Payer: Self-pay | Admitting: *Deleted

## 2022-04-24 DIAGNOSIS — I723 Aneurysm of iliac artery: Secondary | ICD-10-CM

## 2022-04-24 DIAGNOSIS — I7121 Aneurysm of the ascending aorta, without rupture: Secondary | ICD-10-CM

## 2022-04-24 NOTE — Telephone Encounter (Signed)
-----  Message from Wellington Hampshire, MD sent at 04/24/2022 12:12 PM EST ----- Ultrasound confirmed the presence of abdominal aortic aneurysm as well as iliac artery aneurysms.  The size might be large enough to require repair.  In order to better evaluate this, please schedule him for CTA of the abdominal aorta with distal runoff to be done before his next follow-up with me.

## 2022-04-24 NOTE — Telephone Encounter (Addendum)
Left a message for the patient to call back.  Echo: Inform patient that echo showed normal LV systolic function.  However, his aortic valve is likely abnormal and bicuspid.  He will require a transesophageal echocardiogram at some point in the near future.  Keep follow-up with me in February to discuss.

## 2022-04-29 NOTE — Telephone Encounter (Signed)
Patient was returning call. Please advise ?

## 2022-04-29 NOTE — Telephone Encounter (Signed)
Left message to call back  

## 2022-04-29 NOTE — Telephone Encounter (Signed)
Please see below.

## 2022-04-30 NOTE — Telephone Encounter (Signed)
Patient has been made aware of results. Appointment 2/9 with Dr. Fletcher Anon to discuss the TEE.

## 2022-05-01 ENCOUNTER — Encounter: Payer: Self-pay | Admitting: Oncology

## 2022-05-01 NOTE — Telephone Encounter (Signed)
Orders placed for the CT and BMET.

## 2022-05-06 ENCOUNTER — Ambulatory Visit
Admission: RE | Admit: 2022-05-06 | Discharge: 2022-05-06 | Disposition: A | Discharge status: Home / Self Care | Payer: 59 | Source: Ambulatory Visit | Attending: Cardiovascular Disease | Admitting: Cardiovascular Disease

## 2022-05-06 DIAGNOSIS — I771 Stricture of artery: Secondary | ICD-10-CM | POA: Diagnosis not present

## 2022-05-06 DIAGNOSIS — K5730 Diverticulosis of large intestine without perforation or abscess without bleeding: Secondary | ICD-10-CM | POA: Diagnosis not present

## 2022-05-06 DIAGNOSIS — I7121 Aneurysm of the ascending aorta, without rupture: Secondary | ICD-10-CM | POA: Insufficient documentation

## 2022-05-06 DIAGNOSIS — I723 Aneurysm of iliac artery: Secondary | ICD-10-CM | POA: Insufficient documentation

## 2022-05-06 DIAGNOSIS — I722 Aneurysm of renal artery: Secondary | ICD-10-CM | POA: Diagnosis not present

## 2022-05-06 LAB — POCT I-STAT CREATININE: Creatinine, Ser: 0.9 mg/dL (ref 0.61–1.24)

## 2022-05-06 MED ORDER — IOHEXOL 350 MG/ML SOLN
125.0000 mL | Freq: Once | INTRAVENOUS | Status: AC | PRN
  Administered 2022-05-06: 100 mL via INTRAVENOUS

## 2022-05-14 ENCOUNTER — Encounter: Payer: Self-pay | Admitting: Oncology

## 2022-05-15 ENCOUNTER — Ambulatory Visit: Payer: 59 | Admitting: Cardiovascular Disease

## 2022-05-19 ENCOUNTER — Encounter: Payer: Self-pay | Admitting: Cardiovascular Disease

## 2022-05-19 ENCOUNTER — Ambulatory Visit: Payer: 59 | Attending: Cardiovascular Disease | Admitting: Cardiovascular Disease

## 2022-05-19 ENCOUNTER — Ambulatory Visit: Payer: Medicare Other | Admitting: Internal Medicine

## 2022-05-19 VITALS — BP 140/100 | HR 61 | Ht 75.0 in | Wt 206.5 lb

## 2022-05-19 DIAGNOSIS — I7121 Aneurysm of the ascending aorta, without rupture: Secondary | ICD-10-CM | POA: Diagnosis not present

## 2022-05-19 DIAGNOSIS — R0602 Shortness of breath: Secondary | ICD-10-CM | POA: Diagnosis not present

## 2022-05-19 DIAGNOSIS — E785 Hyperlipidemia, unspecified: Secondary | ICD-10-CM

## 2022-05-19 DIAGNOSIS — Z720 Tobacco use: Secondary | ICD-10-CM

## 2022-05-19 DIAGNOSIS — I10 Essential (primary) hypertension: Secondary | ICD-10-CM | POA: Diagnosis not present

## 2022-05-19 DIAGNOSIS — I723 Aneurysm of iliac artery: Secondary | ICD-10-CM | POA: Diagnosis not present

## 2022-05-19 MED ORDER — CARVEDILOL 12.5 MG PO TABS: 12.5000 mg | ORAL_TABLET | Freq: Two times a day (BID) | ORAL | 3 refills | Status: DC

## 2022-05-19 NOTE — Progress Notes (Signed)
Cardiology Office Note   Date:  05/19/2022   ID:  Joshua Schmidt, DOB May 12, 1950, MRN [id number]  PCP:  Patient, No Pcp Per  Cardiologist:   Kathlyn Sacramento, MD   Chief Complaint  Patient presents with   Other    3 month f/u discuss TEE no complaints today. Meds reviewed verbally with pt.      History of Present Illness: Joshua Schmidt is a 72 y.o. male who is here today for follow-up visit regarding aortic and iliac aneurysms and possible bicuspid aortic valve.   The patient reports possibly having myocardial infarction at home years ago but did not seek medical attention.  He has chronic medical conditions that include essential hypertension, hyperlipidemia, tobacco use and COPD.  He has no family history of aortic aneurysm or coronary artery disease.  He used to smoke 1-1/2 pack/day but has been trying to cut down and is currently down to half a pack per day. He has been getting CT scan of the lungs for cancer screening and was noted to have dilated ascending aorta on recent imaging measured at 4.7 cm.  In addition, he was noted to have aortic and coronary calcifications and calcified mitral valve annulus.  His pulmonary artery was noted to be dilated.  He also has history of chronic venous insufficiency with normal ABI.  He is status post sclerotherapy involving the right lower extremity. Due to concerns about dilated pulmonary artery, he underwent an echocardiogram which showed normal LV systolic function with grade 1 diastolic dysfunction and possible bicuspid aortic valve. Abdominal aortic ultrasound showed evidence of infrarenal  abdominal aortic aneurysm as well as bilateral iliac aneurysms.  This was followed by CTA which showed tortuous aorta with infrarenal abdominal aortic aneurysm measuring 4.2 cm.  In addition, the right common iliac artery was aneurysmal with a diameter of 3.3 cm and the left common iliac artery had a diameter of 2.3 cm.  He denies chest pain but has  chronic exertional dyspnea.  He reports difficult to control hypertension.   Past Medical History:  Diagnosis Date   Erythrocytosis 12/01/2018   Hypertension    Myocardial infarction [facility])     Past Surgical History:  Procedure Laterality Date   APPENDECTOMY     COLONOSCOPY WITH PROPOFOL N/A 11/20/2014   Procedure: COLONOSCOPY WITH PROPOFOL;  Surgeon: Christene Lye, MD;  Location: [facility] ENDOSCOPY;  Service: Endoscopy;  Laterality: N/A;   COLONOSCOPY WITH PROPOFOL N/A 03/19/2022   Procedure: COLONOSCOPY WITH PROPOFOL;  Surgeon: Lin Landsman, MD;  Location: [facility] ENDOSCOPY;  Service: Gastroenterology;  Laterality: N/A;   TONSILLECTOMY AND ADENOIDECTOMY       Current Outpatient Medications  Medication Sig Dispense Refill   aspirin 81 MG chewable tablet Chew 81 mg by mouth daily.     atorvastatin (LIPITOR) 20 MG tablet TAKE 1 TABLET BY MOUTH  DAILY 90 tablet 3   hydrochlorothiazide (HYDRODIURIL) 25 MG tablet TAKE 1 TABLET BY MOUTH DAILY 100 tablet 2   lisinopril (ZESTRIL) 20 MG tablet TAKE 1 TABLET BY MOUTH  DAILY 90 tablet 3   metoprolol tartrate (LOPRESSOR) 50 MG tablet TAKE 1 TABLET BY MOUTH  DAILY 90 tablet 3   No current facility-administered medications for this visit.    Allergies:   Patient has no known allergies.    Social History:  The patient  reports that he has been smoking cigarettes. He has a 76.50 pack-year smoking history. He has never used smokeless tobacco. He reports current  alcohol use. He reports that he does not use drugs.   Family History:  The patient's family history is not on file.    ROS:  Please see the history of present illness.   Otherwise, review of systems are positive for none.   All other systems are reviewed and negative.    PHYSICAL EXAM: VS:  BP (!) 140/100 (BP Location: Left Arm, Patient Position: Sitting, Cuff Size: Normal)   Pulse 61   Ht '6\' 3"'$  (1.905 m)   Wt 206 lb 8 oz (93.7 kg)   SpO2 96%   BMI 25.81 kg/m  , BMI Body  mass index is 25.81 kg/m. GEN: Well nourished, well developed, in no acute distress  HEENT: normal  Neck: no JVD, carotid bruits, or masses Cardiac: RRR; no  rubs, or gallops,no edema .  1/6 systolic murmur in the aortic area. Respiratory:  clear to auscultation bilaterally, normal work of breathing GI: soft, nontender, nondistended, + BS MS: no deformity or atrophy  Skin: warm and dry, no rash Neuro:  Strength and sensation are intact Psych: euthymic mood, full affect   EKG:  EKG is ordered today. The ekg ordered today demonstrates sinus rhythm with first-degree AV block, left axis deviation   Recent Labs: 10/21/2021: TSH 0.69 01/22/2022: ALT 20; BUN 12; Hemoglobin 17.7; Platelets 218; Potassium 3.9; Sodium 139 05/06/2022: Creatinine, Ser 0.90    Lipid Panel    Component Value Date/Time   CHOL 133 10/21/2021 1145   TRIG 63 10/21/2021 1145   HDL 69 10/21/2021 1145   CHOLHDL 1.9 10/21/2021 1145   LDLCALC 50 10/21/2021 1145      Wt Readings from Last 3 Encounters:  05/19/22 206 lb 8 oz (93.7 kg)  03/19/22 205 lb 6.8 oz (93.2 kg)  03/10/22 216 lb (98 kg)          02/17/2022    1:54 PM  PAD Screen  Previous PAD dx? Yes  Previous surgical procedure? Yes  Pain with walking? No  Feet/toe relief with dangling? No  Painful, non-healing ulcers? No  Extremities discolored? No      ASSESSMENT AND PLAN:  1.  Ascending aortic aneurysm: This is below threshold for surgical repair.  If we confirm bicuspid aortic valve, the threshold for surgical repair is 5 cm.  Will plan on proceeding with a transesophageal echocardiogram in the next 6 months.  2.  Exertional dyspnea and abnormal EKG: Echocardiogram showed normal LV systolic function and possible bicuspid aortic valve.  I requested a Lexiscan Myoview to evaluate for possible underlying ischemic heart disease.  3.  Essential hypertension: He reports difficult to control blood pressure.  I elected to switch him from  metoprolol to carvedilol 12.5 mg twice daily.  I explained to him the importance of controlling his blood pressure especially in the setting of aneurysmal disease.  4.  Hyperlipidemia: Continue atorvastatin 20 mg once daily.  I reviewed most recent lipid profile which showed an LDL of 50 which is at target.  5.  Tobacco use: I discussed with him the importance of smoking cessation.  6.  Abdominal aortic and iliac artery aneurysm: The right common iliac artery aneurysm is 3.3 cm and above the threshold for repair.  Due to that, I referred him to vascular surgery for evaluation.   Disposition:   FU with me in 3 months  Signed,  Kathlyn Sacramento, MD  05/19/2022 1:06 PM    [place]

## 2022-05-19 NOTE — Patient Instructions (Addendum)
Medication Instructions:  Discontinue the Metoprolol  START Carvedilol 12.5 mg twice daily  *If you need a refill on your cardiac medications before your next appointment, please call your pharmacy*   Lab Work: None ordered If you have labs (blood work) drawn today and your tests are completely normal, you will receive your results only by: [place] (if you have MyChart) OR A paper copy in the mail If you have any lab test that is abnormal or we need to change your treatment, we will call you to review the results.   Testing/Procedures: Your provider has ordered a [place] test. This will take place at [facility]. Please report to the [facility] medical mall entrance. The volunteers at the first desk will direct you where to go.  [place]  Your provider has ordered a Stress Test with nuclear imaging. The purpose of this test is to evaluate the blood supply to your heart muscle. This procedure is referred to as a "Non-Invasive Stress Test." This is because other than having an IV started in your vein, nothing is inserted or "invades" your body. Cardiac stress tests are done to find areas of poor blood flow to the heart by determining the extent of coronary artery disease (CAD). Some patients exercise on a treadmill, which naturally increases the blood flow to your heart, while others who are unable to walk on a treadmill due to physical limitations will have a pharmacologic/chemical stress agent called Lexiscan . This medicine will mimic walking on a treadmill by temporarily increasing your coronary blood flow.   Please note: these test may take anywhere between 2-4 hours to complete  How to prepare for your Myoview test:  Nothing to eat for 6 hours prior to the test No caffeine for 24 hours prior to test No smoking 24 hours prior to test. Your medication may be taken with water.  If your doctor stopped a medication because of this test, do not take that medication. Ladies,  please do not wear dresses.  Skirts or pants are appropriate. Please wear a short sleeve shirt. No perfume, cologne or lotion. Wear comfortable walking shoes. No heels!   PLEASE NOTIFY THE OFFICE AT LEAST 51 HOURS IN ADVANCE IF YOU ARE UNABLE TO KEEP YOUR APPOINTMENT.  [phone] AND  PLEASE NOTIFY NUCLEAR MEDICINE AT [facility] AT LEAST 24 HOURS IN ADVANCE IF YOU ARE UNABLE TO KEEP YOUR APPOINTMENT. [phone]    Follow-Up: At [facility], you and your health needs are our priority.  As part of our continuing mission to provide you with exceptional heart care, we have created designated Provider Care Teams.  These Care Teams include your primary Cardiologist (physician) and Advanced Practice Providers (APPs -  Physician Assistants and Nurse Practitioners) who all work together to provide you with the care you need, when you need it.  We recommend signing up for the patient portal called "MyChart".  Sign up information is provided on this After Visit Summary.  MyChart is used to connect with patients for Virtual Visits (Telemedicine).  Patients are able to view lab/test results, encounter notes, upcoming appointments, etc.  Non-urgent messages can be sent to your provider as well.   To learn more about what you can do with MyChart, go to [email or web address].    Your next appointment:   3 month(s)  Provider:   You may see Kathlyn Sacramento, MD or one of the following Advanced Practice Providers on your designated Care Team:   Murray Hodgkins, NP Thurmond Butts  Dunn, PA-C Cadence Kathlen Mody, PA-C Gerrie Nordmann, NP    Other Instructions A referral has been placed to Vein and Vascular Surgery in [place]

## 2022-05-21 ENCOUNTER — Encounter: Payer: Self-pay | Admitting: Internal Medicine

## 2022-05-21 ENCOUNTER — Ambulatory Visit (INDEPENDENT_AMBULATORY_CARE_PROVIDER_SITE_OTHER): Payer: 59 | Admitting: Internal Medicine

## 2022-05-21 VITALS — BP 118/68 | HR 73 | Temp 98.7°F | Resp 18 | Ht 75.0 in | Wt 204.3 lb

## 2022-05-21 DIAGNOSIS — I10 Essential (primary) hypertension: Secondary | ICD-10-CM

## 2022-05-21 DIAGNOSIS — Z720 Tobacco use: Secondary | ICD-10-CM | POA: Diagnosis not present

## 2022-05-21 DIAGNOSIS — I2510 Atherosclerotic heart disease of native coronary artery without angina pectoris: Secondary | ICD-10-CM | POA: Diagnosis not present

## 2022-05-21 DIAGNOSIS — J3489 Other specified disorders of nose and nasal sinuses: Secondary | ICD-10-CM | POA: Diagnosis not present

## 2022-05-21 DIAGNOSIS — I872 Venous insufficiency (chronic) (peripheral): Secondary | ICD-10-CM

## 2022-05-21 DIAGNOSIS — D751 Secondary polycythemia: Secondary | ICD-10-CM | POA: Diagnosis not present

## 2022-05-21 DIAGNOSIS — I7121 Aneurysm of the ascending aorta, without rupture: Secondary | ICD-10-CM

## 2022-05-21 DIAGNOSIS — L989 Disorder of the skin and subcutaneous tissue, unspecified: Secondary | ICD-10-CM

## 2022-05-21 DIAGNOSIS — F4310 Post-traumatic stress disorder, unspecified: Secondary | ICD-10-CM

## 2022-05-21 MED ORDER — FLUTICASONE PROPIONATE 50 MCG/ACT NA SUSP: 2.0000 | Freq: Every day | NASAL | 6 refills | Status: AC

## 2022-05-21 NOTE — Progress Notes (Signed)
New Patient Office Visit  Subjective    Patient ID: Joshua Schmidt, male    DOB: 04/06/1951  Age: 72 y.o. MRN: [id number]  CC:  Chief Complaint  Patient presents with   Establish Care    Specialist-cardio, vascular    HPI Joshua Schmidt presents to establish care.  Hypertension: -Medications: Coreg 12.5 mg BID (just switched by Cardiology), HCTZ 25 mg, Lisinopril 20 mg -Patient is compliant with above medications and reports no side effects. -Checking BP at home (average): 130-140/80-90 -Denies any SOB, CP, vision changes, LE edema or symptoms of hypotension -Following with Cardiology, last seen on 05/19/22  HLD/History of MI/Aortic and Iliac Aneurysms/Chronic Venous Insufficiency:   -Medications: Lipitor 20 mg, aspirin 81 mg -Patient is compliant with above medications and reports no side effects.  -History of MI in 2022 for which he did not seek medical attention at the time, was diagnosed retroactively  -Following with vascular surgery for abnormal ABI's -Planning for nuclear study next week to determine bicuspid aortic valve and if surgical repair is the best course of action -Last lipid panel: Lipid Panel     Component Value Date/Time   CHOL 133 10/21/2021 1145   TRIG 63 10/21/2021 1145   HDL 69 10/21/2021 1145   CHOLHDL 1.9 10/21/2021 1145   LDLCALC 50 10/21/2021 1145   Erythrocytosis:  -Following with Hematology, last seen 01/22/22 -Has labs monthly, sometimes requires phlebotomy but hasn't had to have one in 2 months.  -Last CBC 10/23 hgb 17.7   COPD: -COPD status: stable -Current medications: Nothing  -Oxygen use: no -Dyspnea frequency: None -Cough frequency: Daily, productive  -Limitation of activity: no -Pneumovax: Up to Date -Influenza: Up to Date -Annual lung cancer screening: 10/23 Lung-RADS-2  -Current smoker but trying to decrease  -Does have some post nasal drip, does will with Flonase   PTSD: -Not currently on medication -Had done  counseling in the past but not interested it anything now  Health Maintance: -Blood work UTD -Colon cancer screening: colonoscopy 12/23, 3 polyps removed, plan tto follow up in 1 year   Outpatient Encounter Medications as of 05/21/2022  Medication Sig   aspirin 81 MG chewable tablet Chew 81 mg by mouth daily.   atorvastatin (LIPITOR) 20 MG tablet TAKE 1 TABLET BY MOUTH  DAILY   carvedilol (COREG) 12.5 MG tablet Take 1 tablet (12.5 mg total) by mouth 2 (two) times daily.   hydrochlorothiazide (HYDRODIURIL) 25 MG tablet TAKE 1 TABLET BY MOUTH DAILY   lisinopril (ZESTRIL) 20 MG tablet TAKE 1 TABLET BY MOUTH  DAILY   No facility-administered encounter medications on file as of 05/21/2022.    Past Medical History:  Diagnosis Date   Aneurysm ([place])    Erythrocytosis 12/01/2018   Hyperlipidemia    Hypertension    Myocardial infarction [facility])     Past Surgical History:  Procedure Laterality Date   APPENDECTOMY     COLONOSCOPY WITH PROPOFOL N/A 11/20/2014   Procedure: COLONOSCOPY WITH PROPOFOL;  Surgeon: Christene Lye, MD;  Location: [facility] ENDOSCOPY;  Service: Endoscopy;  Laterality: N/A;   COLONOSCOPY WITH PROPOFOL N/A 03/19/2022   Procedure: COLONOSCOPY WITH PROPOFOL;  Surgeon: Lin Landsman, MD;  Location: [facility] ENDOSCOPY;  Service: Gastroenterology;  Laterality: N/A;   TONSILLECTOMY AND ADENOIDECTOMY      History reviewed. No pertinent family history.  Social History   Socioeconomic History   Marital status: Married    Spouse name: Not on file   Number of children: Not  on file   Years of education: Not on file   Highest education level: Not on file  Occupational History   Not on file  Tobacco Use   Smoking status: Every Day    Packs/day: 1.50    Years: 51.00    Total pack years: 76.50    Types: Cigarettes   Smokeless tobacco: Never   Tobacco comments:    1ppd currently  Vaping Use   Vaping Use: Never used  Substance and Sexual Activity   Alcohol use: Yes    Drug use: No   Sexual activity: Not Currently  Other Topics Concern   Not on file  Social History Narrative   Not on file   Social Determinants of Health   Financial Resource Strain: Low Risk  (11/06/2021)   Overall Financial Resource Strain (CARDIA)    Difficulty of Paying Living Expenses: Not very hard  Food Insecurity: No Food Insecurity (11/06/2021)   Hunger Vital Sign    Worried About Running Out of Food in the Last Year: Never true    Ran Out of Food in the Last Year: Never true  Transportation Needs: No Transportation Needs (11/06/2021)   PRAPARE - Hydrologist (Medical): No    Lack of Transportation (Non-Medical): No  Physical Activity: Sufficiently Active (11/06/2021)   Exercise Vital Sign    Days of Exercise per Week: 3 days    Minutes of Exercise per Session: 90 min  Stress: No Stress Concern Present (11/06/2021)   [place]    Feeling of Stress : Only a little  Social Connections: Unknown (11/06/2021)   Social Connection and Isolation Panel [NHANES]    Frequency of Communication with Friends and Family: Three times a week    Frequency of Social Gatherings with Friends and Family: More than three times a week    Attends Religious Services: Not on file    Active Member of Clubs or Organizations: No    Attends Archivist Meetings: Never    Marital Status: Married  Human resources officer Violence: Not on file    Review of Systems  Constitutional:  Negative for chills and fever.  Eyes:  Negative for blurred vision.  Respiratory:  Positive for cough and shortness of breath. Negative for sputum production.   Cardiovascular:  Negative for chest pain, palpitations and leg swelling.  Gastrointestinal:  Negative for abdominal pain.        Objective    BP 118/68   Pulse 73   Temp 98.7 F (37.1 C)   Resp 18   Ht '6\' 3"'$  (1.905 m)   Wt 204 lb 4.8 oz (92.7 kg)   SpO2 (!)  89%   BMI 25.54 kg/m   Physical Exam Constitutional:      Appearance: Normal appearance.  HENT:     Head: Normocephalic and atraumatic.  Eyes:     Conjunctiva/sclera: Conjunctivae normal.  Cardiovascular:     Rate and Rhythm: Normal rate and regular rhythm.  Pulmonary:     Effort: Pulmonary effort is normal.     Breath sounds: Normal breath sounds.  Musculoskeletal:     Right lower leg: No edema.     Left lower leg: No edema.  Skin:    General: Skin is warm and dry.     Findings: Lesion present.     Comments: Calcified lesion on forehead with multiple AK's on face  Neurological:     General: No  focal deficit present.     Mental Status: He is alert. Mental status is at baseline.  Psychiatric:        Mood and Affect: Mood normal.        Behavior: Behavior normal.     Last CBC Lab Results  Component Value Date   WBC 8.8 01/22/2022   HGB 17.7 (H) 01/22/2022   HCT 54.8 (H) 01/22/2022   MCV 90.3 01/22/2022   MCH 29.2 01/22/2022   RDW 20.9 (H) 01/22/2022   PLT 218 98/33/8250   Last metabolic panel Lab Results  Component Value Date   GLUCOSE 108 (H) 01/22/2022   NA 139 01/22/2022   K 3.9 01/22/2022   CL 100 01/22/2022   CO2 30 01/22/2022   BUN 12 01/22/2022   CREATININE 0.90 05/06/2022   GFRNONAA >60 01/22/2022   CALCIUM 9.0 01/22/2022   PROT 7.3 01/22/2022   ALBUMIN 3.6 01/22/2022   BILITOT 0.6 01/22/2022   ALKPHOS 58 01/22/2022   AST 24 01/22/2022   ALT 20 01/22/2022   ANIONGAP 9 01/22/2022   Last lipids Lab Results  Component Value Date   CHOL 133 10/21/2021   HDL 69 10/21/2021   LDLCALC 50 10/21/2021   TRIG 63 10/21/2021   CHOLHDL 1.9 10/21/2021   Last hemoglobin A1c No results found for: "HGBA1C" Last thyroid functions Lab Results  Component Value Date   TSH 0.69 10/21/2021   Last vitamin D No results found for: "25OHVITD2", "25OHVITD3", "VD25OH" Last vitamin B12 and Folate No results found for: "VITAMINB12", "FOLATE"      Assessment  & Plan:   1. Hypertension, unspecified type: Chronic and stable, just switched to Coreg 12.5 mg BID, HCTZ 25 mg and Lisinopril 20 mg, no changes made today.  2. Aneurysm of ascending aorta without rupture (HCC)/Chronic venous insufficiency: Following with Cardiology, note from 05/19/22 reviewed with plans for nuclear study next week.  3. Coronary artery disease involving native heart without angina pectoris, unspecified vessel or lesion type: Currently on Lipitor 20 mg, aspirin.   4. Erythrocytosis: Following with Hematology, note and labs from 01/22/22 reviewed, planning on repeating labs soon.  5. Skin lesion of face: Referral to Dermatology placed.   - Ambulatory referral to Dermatology  6. Sinus drainage: Prescribe Flonase to use daily.   - fluticasone (FLONASE) 50 MCG/ACT nasal spray; Place 2 sprays into both nostrils daily.  Dispense: 16 g; Refill: 6  7. Tobacco use: Working on cutting back on smoking, down to about 1/2 pack per day. Discussed different medical therapies to help with cravings but patient declined.   8. PTSD (post-traumatic stress disorder): Discussed restarting counseling services and potentially starting medications for symptoms, patient declines today. Will continue to monitor.    Return in about 6 months (around 11/19/2022) for wife scheduled 8/9 at 1.   Teodora Medici, DO

## 2022-05-21 NOTE — Patient Instructions (Addendum)
It was great seeing you today!  Plan discussed at today's visit: -Flonase ordered today - recommend 2 sprays on each side twice a day, can also do nasal saline to keep tissues moisturized -If you change your mind about smoking cessation medications or counseling please let me know -Discuss second shingles vaccine with pharmacist -Referral to Dermatology placed -Plan for fasting labs at follow up  Follow up in: 6 months   Take care and let us know if you have any questions or concerns prior to your next visit.  Dr. Rosana Berger

## 2022-05-22 ENCOUNTER — Ambulatory Visit: Payer: Medicare Other | Admitting: Cardiovascular Disease

## 2022-05-26 ENCOUNTER — Encounter
Admission: RE | Admit: 2022-05-26 | Discharge: 2022-05-26 | Disposition: A | Discharge status: Home / Self Care | Payer: 59 | Source: Ambulatory Visit | Attending: Cardiovascular Disease | Admitting: Cardiovascular Disease

## 2022-05-26 DIAGNOSIS — I2510 Atherosclerotic heart disease of native coronary artery without angina pectoris: Secondary | ICD-10-CM | POA: Diagnosis not present

## 2022-05-26 DIAGNOSIS — R0602 Shortness of breath: Secondary | ICD-10-CM | POA: Insufficient documentation

## 2022-05-26 DIAGNOSIS — I700 Atherosclerosis of aorta: Secondary | ICD-10-CM | POA: Diagnosis not present

## 2022-05-26 LAB — NM MYOCAR MULTI W/SPECT W/WALL MOTION / EF: Nuc Stress EF: 65 %

## 2022-05-26 MED ORDER — TECHNETIUM TC 99M TETROFOSMIN IV KIT
32.1600 | PACK | Freq: Once | INTRAVENOUS | Status: AC | PRN
  Administered 2022-05-26: 32.16 via INTRAVENOUS

## 2022-05-26 MED ORDER — TECHNETIUM TC 99M TETROFOSMIN IV KIT
11.1400 | PACK | Freq: Once | INTRAVENOUS | Status: AC | PRN
  Administered 2022-05-26: 11.14 via INTRAVENOUS

## 2022-05-26 MED ORDER — REGADENOSON 0.4 MG/5ML IV SOLN
0.4000 mg | Freq: Once | INTRAVENOUS | Status: AC
  Administered 2022-05-26: 0.4 mg via INTRAVENOUS
  Filled 2022-05-26: qty 5

## 2022-05-27 LAB — NM MYOCAR MULTI W/SPECT W/WALL MOTION / EF
LV dias vol: 126 mL (ref 62–150)
LV sys vol: 44 mL
Nuc Stress EF: 65 %
Peak HR: 86 {beats}/min
Percent HR: 58 %
Rest HR: 71 {beats}/min
Rest Nuclear Isotope Dose: 11.1 mCi
SDS: 0
SRS: 8
SSS: 5
ST Depression (mm): 0 mm
Stress Nuclear Isotope Dose: 32.2 mCi
TID: 0.99

## 2022-05-28 NOTE — Progress Notes (Signed)
Office Note     CC: Aortoiliac aneurysmal disease Requesting Provider:  Teodora Medici, DO  HPI: Joshua Schmidt is a 72 y.o. ([birth year]) male presenting at the request of .Joshua Medici, DO for evaluation of aortoiliac aneurysmal disease.  On exam today, Joshua Schmidt is doing well.  A general contractor by trade, he has recently retired.  He is a current smoker, and continues to live an active, independent lifestyle.  Joshua Schmidt is known about his aneurysmal disease for over a year.  It has been followed by his primary care, Dr. Rosana Schmidt.  Most recent CT demonstrated some growth, therefore he was referred to our office for further management.  Joshua Schmidt denies abdominal back, chest pain.  No family history of aneurysmal disease.  Vascular/venous surgical history includes bilateral varicose vein procedures.   The pt is  on a statin for cholesterol management.  The pt is  on a daily aspirin.   Other AC:  - The pt is  on medication for hypertension.   The pt is not diabetic.  Tobacco hx:  current- cutting back   Past Medical History:  Diagnosis Date   Aneurysm ([place])    Erythrocytosis 12/01/2018   Hyperlipidemia    Hypertension    Myocardial infarction [facility])     Past Surgical History:  Procedure Laterality Date   APPENDECTOMY     COLONOSCOPY WITH PROPOFOL N/A 11/20/2014   Procedure: COLONOSCOPY WITH PROPOFOL;  Surgeon: Joshua Lye, MD;  Location: [facility] ENDOSCOPY;  Service: Endoscopy;  Laterality: N/A;   COLONOSCOPY WITH PROPOFOL N/A 03/19/2022   Procedure: COLONOSCOPY WITH PROPOFOL;  Surgeon: Joshua Landsman, MD;  Location: [facility] ENDOSCOPY;  Service: Gastroenterology;  Laterality: N/A;   TONSILLECTOMY AND ADENOIDECTOMY      Social History   Socioeconomic History   Marital status: Married    Spouse name: Not on file   Number of children: Not on file   Years of education: Not on file   Highest education level: Not on file  Occupational History   Not on file  Tobacco Use    Smoking status: Every Day    Packs/day: 1.50    Years: 51.00    Total pack years: 76.50    Types: Cigarettes   Smokeless tobacco: Never   Tobacco comments:    1ppd currently  Vaping Use   Vaping Use: Never used  Substance and Sexual Activity   Alcohol use: Yes   Drug use: No   Sexual activity: Not Currently  Other Topics Concern   Not on file  Social History Narrative   Not on file   Social Determinants of Health   Financial Resource Strain: Low Risk  (11/06/2021)   Overall Financial Resource Strain (CARDIA)    Difficulty of Paying Living Expenses: Not very hard  Food Insecurity: No Food Insecurity (11/06/2021)   Hunger Vital Sign    Worried About Running Out of Food in the Last Year: Never true    Ran Out of Food in the Last Year: Never true  Transportation Needs: No Transportation Needs (11/06/2021)   PRAPARE - Hydrologist (Medical): No    Lack of Transportation (Non-Medical): No  Physical Activity: Sufficiently Active (11/06/2021)   Exercise Vital Sign    Days of Exercise per Week: 3 days    Minutes of Exercise per Session: 90 min  Stress: No Stress Concern Present (11/06/2021)   [place]    Feeling of Stress :  Only a little  Social Connections: Unknown (11/06/2021)   Social Connection and Isolation Panel [NHANES]    Frequency of Communication with Friends and Family: Three times a week    Frequency of Social Gatherings with Friends and Family: More than three times a week    Attends Religious Services: Not on file    Active Member of Clubs or Organizations: No    Attends Archivist Meetings: Never    Marital Status: Married  Human resources officer Violence: Not on file   No family history on file.  Current Outpatient Medications  Medication Sig Dispense Refill   aspirin 81 MG chewable tablet Chew 81 mg by mouth daily.     atorvastatin (LIPITOR) 20 MG tablet TAKE 1  TABLET BY MOUTH  DAILY 90 tablet 3   carvedilol (COREG) 12.5 MG tablet Take 1 tablet (12.5 mg total) by mouth 2 (two) times daily. 180 tablet 3   fluticasone (FLONASE) 50 MCG/ACT nasal spray Place 2 sprays into both nostrils daily. 16 g 6   hydrochlorothiazide (HYDRODIURIL) 25 MG tablet TAKE 1 TABLET BY MOUTH DAILY 100 tablet 2   lisinopril (ZESTRIL) 20 MG tablet TAKE 1 TABLET BY MOUTH  DAILY 90 tablet 3   No current facility-administered medications for this visit.    No Known Allergies   REVIEW OF SYSTEMS:   [X]$  denotes positive finding, [ ]$  denotes negative finding Cardiac  Comments:  Chest pain or chest pressure:    Shortness of breath upon exertion:    Short of breath when lying flat:    Irregular heart rhythm:        Vascular    Pain in calf, thigh, or hip brought on by ambulation:    Pain in feet at night that wakes you up from your sleep:     Blood clot in your veins:    Leg swelling:         Pulmonary    Oxygen at home:    Productive cough:     Wheezing:         Neurologic    Sudden weakness in arms or legs:     Sudden numbness in arms or legs:     Sudden onset of difficulty speaking or slurred speech:    Temporary loss of vision in one eye:     Problems with dizziness:         Gastrointestinal    Blood in stool:     Vomited blood:         Genitourinary    Burning when urinating:     Blood in urine:        Psychiatric    Major depression:         Hematologic    Bleeding problems:    Problems with blood clotting too easily:        Skin    Rashes or ulcers:        Constitutional    Fever or chills:      PHYSICAL EXAMINATION:  There were no vitals filed for this visit.  General:  WDWN in NAD; vital signs documented above Gait: Not observed HENT: WNL, normocephalic Pulmonary: normal non-labored breathing , without wheezing Cardiac: regular HR Abdomen: soft, NT, no masses Skin: without rashes Vascular Exam/Pulses:  Right Left  Radial 2+  (normal) 2+ (normal)  Ulnar    Femoral 2+ (normal) 2+ (normal)  Popliteal    DP 2+ (normal) 2+ (normal)  PT  Extremities: without ischemic changes, without Gangrene , without cellulitis; without open wounds;  Musculoskeletal: no muscle wasting or atrophy  Neurologic: A&O X 3;  No focal weakness or paresthesias are detected Psychiatric:  The pt has Normal affect.   Non-Invasive Vascular Imaging:   Aorta: Highly tortuous thoracoabdominal aorta with fusiform aneurysmal dilation of the infrarenal segment. Vessel diameters can only be assessed on the sagittal and coronal reformatted images due to the underlying tortuosity. Maximal aortic diameter 4.2 x 3.8 cm. The aneurysmal segment is primarily below the origin of the IMA.   Celiac: Patent without evidence of aneurysm, dissection, vasculitis or significant stenosis. The left hepatic artery is replaced to the left gastric artery.   SMA: Patent without evidence of aneurysm, dissection, vasculitis or significant stenosis.   Renals: Both renal arteries are patent without evidence of aneurysm, dissection, vasculitis, fibromuscular dysplasia or significant stenosis.   IMA: Patent without evidence of aneurysm, dissection, vasculitis or significant stenosis.   RIGHT Lower Extremity   Inflow: Large fusiform aneurysmal dilation of the right common iliac artery measuring up to 3.3 cm. The aneurysm extends from the vessel origin to the bifurcation. No evidence of aneurysmal disease involving the internal or external iliac artery. Scattered atherosclerotic plaque. No significant stenosis. No dissection.   Outflow: Scattered atherosclerotic plaque without evidence of significant stenosis or dissection. Mild ectasia of the popliteal artery to 1.3 cm. No focal aneurysm.   Runoff: Patent 3 vessel runoff to the ankle.   LEFT Lower Extremity   Inflow: Focal aneurysmal dilation of the common iliac artery likely due to a penetrating  atherosclerotic plaque. Maximal diameter 2.3 cm.   Outflow: Mild scattered atherosclerotic plaque without significant stenosis, occlusion, aneurysm or dissection. Specifically, no popliteal artery aneurysm.    ASSESSMENT/PLAN: ELISABETH SOWLES is a 72 y.o. male presenting with aortoiliac aneurysmal disease. Most recent CT angiogram performed on 05/06/2022 was reviewed demonstrating infrarenal abdominal aortic aneurysm measuring 4 cm, right-sided common iliac artery aneurysm measuring 3.3 cm, left-sided ectasia at 2.2 cm.  I had a long discussion with Joshua Schmidt regarding the natural history of aortoiliac aneurysmal disease, as well as the treatment modalities associated.  We discussed that at his current sizes, we can continue to watch the aneurysms as the risk of rupture is less than the risk of surgery.  Should the iliac artery aneurysms reaches size greater than 3.5 cm, or the infrarenal abdominal aneurysm reaches size greater than 5.5 cm we would discuss elective repair.  Joshua Schmidt has a previous history of abdominal surgery through a midline laparotomy. From his most recent CT scan, he appears to be a candidate for endovascular approach.  We discussed what this surgery is comprised of.    My plan is to see, Joshua Schmidt every 6 months with aortogram iliac duplex ultrasound.  At the time of his next visit, I will also evaluate his bilateral popliteal arteries to ensure there is no aneurysmal dilatation.  This was not appreciated on physical exam.  We discussed the signs and symptoms of rupture, and I asked him to call 911 immediately should any of these occur.  He is working on smoking cessation.  I asked that he continue his aspirin and statin therapy.    Broadus John, MD [facility] [phone]

## 2022-05-29 ENCOUNTER — Inpatient Hospital Stay: Payer: 59

## 2022-05-29 ENCOUNTER — Ambulatory Visit (INDEPENDENT_AMBULATORY_CARE_PROVIDER_SITE_OTHER): Payer: 59 | Admitting: Vascular Surgery

## 2022-05-29 ENCOUNTER — Inpatient Hospital Stay: Payer: 59 | Admitting: Oncology

## 2022-05-29 ENCOUNTER — Encounter: Payer: Self-pay | Admitting: *Deleted

## 2022-05-29 ENCOUNTER — Encounter: Payer: Self-pay | Admitting: Vascular Surgery

## 2022-05-29 ENCOUNTER — Other Ambulatory Visit: Payer: Self-pay

## 2022-05-29 VITALS — BP 134/82 | HR 77 | Temp 98.5°F | Resp 20 | Ht 75.0 in | Wt 204.3 lb

## 2022-05-29 DIAGNOSIS — I723 Aneurysm of iliac artery: Secondary | ICD-10-CM

## 2022-05-29 DIAGNOSIS — I7143 Infrarenal abdominal aortic aneurysm, without rupture: Secondary | ICD-10-CM | POA: Diagnosis not present

## 2022-05-29 DIAGNOSIS — I7121 Aneurysm of the ascending aorta, without rupture: Secondary | ICD-10-CM

## 2022-06-01 ENCOUNTER — Ambulatory Visit: Payer: Self-pay

## 2022-06-01 NOTE — Progress Notes (Unsigned)
   Acute Office Visit  Subjective:     Patient ID: Joshua Schmidt, male    DOB: [birth year], 72 y.o.   MRN: [id number]  No chief complaint on file.   HPI Patient is in today for facial drooping.     ROS      Objective:    There were no vitals taken for this visit. {Vitals History (Optional):23777}  Physical Exam  No results found for any visits on 06/02/22.      Assessment & Plan:   Problem List Items Addressed This Visit   None   No orders of the defined types were placed in this encounter.   No follow-ups on file.  Teodora Medici, DO

## 2022-06-01 NOTE — Telephone Encounter (Signed)
  Chief Complaint: Facial drooping - bilateral Symptoms: above Frequency: Since Saturday Pertinent Negatives: Patient denies any other s/s Disposition: []$ ED /[]$ Urgent Care (no appt availability in office) / []$ Appointment(In office/virtual)/ []$  [facility]/ []$ Home Care/ [x]$ Refused Recommended Disposition /[]$ [facility]/ []$  Follow-up with PCP Additional Notes: PT states that he has had episodes like this in the past. He wakes up with bilateral facial drooping. It tends to get worse and then it stops and pt returns to normal. Pt reports not other s/s. Wife took pictures. Pt refused OV today. Pt will go to ED if needed.    Reason for Disposition  Bell's palsy suspected (i.e., weakness on only one side of the face, developing over hours to days, no other symptoms)  Answer Assessment - Initial Assessment Questions 1. SYMPTOM: "What is the main symptom you are concerned about?" (e.g., weakness, numbness)     Facial drooping since the weekend 2. ONSET: "When did this start?" (minutes, hours, days; while sleeping)     Over the weekend 3. LAST NORMAL: "When was the last time you (the patient) were normal (no symptoms)?"     Saturday afternoon 4. PATTERN "Does this come and go, or has it been constant since it started?"  "Is it present now?"     Constant 5. CARDIAC SYMPTOMS: "Have you had any of the following symptoms: chest pain, difficulty breathing, palpitations?"     no 6. NEUROLOGIC SYMPTOMS: "Have you had any of the following symptoms: headache, dizziness, vision loss, double vision, changes in speech, unsteady on your feet?"     no 7. OTHER SYMPTOMS: "Do you have any other symptoms?"     Facial drooping - bilateral 8. PREGNANCY: "Is there any chance you are pregnant?" "When was your last menstrual period?"  Protocols used: Neurologic Deficit-A-AH

## 2022-06-01 NOTE — Telephone Encounter (Signed)
Patient notified of your recomendations

## 2022-06-02 ENCOUNTER — Ambulatory Visit (INDEPENDENT_AMBULATORY_CARE_PROVIDER_SITE_OTHER): Payer: 59 | Admitting: Internal Medicine

## 2022-06-02 ENCOUNTER — Encounter: Payer: Self-pay | Admitting: Internal Medicine

## 2022-06-02 VITALS — BP 118/72 | HR 74 | Temp 97.8°F | Resp 18 | Ht 75.0 in | Wt 209.7 lb

## 2022-06-02 DIAGNOSIS — I10 Essential (primary) hypertension: Secondary | ICD-10-CM

## 2022-06-02 DIAGNOSIS — T783XXA Angioneurotic edema, initial encounter: Secondary | ICD-10-CM

## 2022-06-02 MED ORDER — LOSARTAN POTASSIUM 50 MG PO TABS: 50.0000 mg | ORAL_TABLET | Freq: Every day | ORAL | 1 refills | Status: DC

## 2022-06-02 NOTE — Patient Instructions (Addendum)
It was great seeing you today!  Plan discussed at today's visit: -Stop Lisinopril, switch to Losartan 50 mg -Continue over the counter allergy medication -Start Bendaryl today (can make you sleepy) -If symptoms start up again, let me know right away and start taking Bendaryl. If it progresses you need to go to the ER for evaluation as this can affect your ariway and can become life threatening    Follow up in: 1 month   Take care and let us know if you have any questions or concerns prior to your next visit.  Dr. Rosana Berger   Angioedema Angioedema is the sudden swelling of tissue in the body. Angioedema can affect any part of the body, including the legs, hands, genitals, face, mouth, lips, and internal organs, like your intestines. Depending on the cause, angioedema may happen just once. However, some people may have repeated bouts of angioedema during their lives. Symptoms may be mild and may occur along with other allergic symptoms such as itchy, red, swollen areas of skin (hives). Severe angioedema can be life-threatening if it affects the air passages and blocks breathing. What are the causes? This condition may be caused by: Foods, such as milk, eggs, shellfish, wheat, or nuts. Certain medicines, such as ACE inhibitors, birth control pills, dyes used in X-rays, or NSAIDs, such as ibuprofen. Hereditary angioedema (HAE) is genetic. Episodes can be triggered by: Illness, infection, or emotional or physical stress. Changes in hormone levels. Exercise. Minor surgical or dental procedures. In some cases, the cause of this condition is not known. What increases the risk? You are more likely to develop HAE if you have family members with this condition.  What are the signs or symptoms?  Symptoms of this condition depend on where the swelling happens.  Symptoms of this condition include: Swollen skin. Hives. Pain, pressure, or tenderness in the affected area. Swollen eyelids, face,  lips, or tongue. Trouble drinking, swallowing, or closing the mouth completely. Hoarseness or sore throat. Wheezing or trouble breathing. If your internal organs are affected, symptoms may also include: Nausea. Pain in the abdomen. Vomiting or diarrhea. Trouble swallowing. Trouble passing urine. How is this diagnosed? This condition may be diagnosed based on: An exam of the affected area. Your medical history. Whether anyone in your family has had this condition before. A review of any medicines you have been taking. Tests, including: Allergy skin tests to see if the condition was caused by an allergic reaction. Blood tests to see if the condition was caused by certain inherited or genetic diseases. How is this treated? Treatment for this condition depends on the cause and severity of your symptoms. It may involve any of the following: Avoiding triggers, if they are known. Triggers may include foods or environmental allergens. Stopping medicines permanently if they cause the condition. These include ACE inhibitors. Taking medicines to treat symptoms or prevent future episodes. These may include: Antihistamines. Epinephrine injections. Steroids. Blood products to treat specific types of non-allergic angioedema. Breathing tubes or ventilators in severe cases in which breathing is affected. Severe cases of angioedema are treated at the hospital. Mild to moderate angioedema usually gets better in 24-48 hours. Follow these instructions at home:  Take over-the-counter and prescription medicines only as told by your health care provider. If you were given medicines for emergency allergy treatment, always carry them with you. This includes epinephrine injector kits. Wear a medical bracelet as told by your health care provider. If something triggers your condition, avoid the trigger. Triggers can  be foods, environmental allergens, stress, or exercise. Avoid all medicines that caused your  angioedema. This is for your entire life. If your condition is inherited and you are thinking about having children, talk to your health care provider. It is important to discuss the risks of passing on the condition to your children. Where to find more information American Academy of Allergy Asthma & Immunology: www.aaaai.org Contact a health care provider if: You continue to have repeated episodes of angioedema. Episodes of angioedema start to happen more often than they used to, even after you take steps to prevent them. You have episodes of angioedema that are more severe than they have been before, even after you take steps to prevent them. You are thinking about having children. Get help right away if: You have severe swelling of your mouth, tongue, or lips. Your swelling gets worse. You have trouble breathing, swallowing, or talking. You have chest pain, dizziness or light-headedness, or you pass out. These symptoms may represent a serious problem that is an emergency. Do not wait to see if the symptoms will go away. Get medical help right away. Call your local emergency services (911 in the [place] Do not drive yourself to the hospital. Summary Angioedema is the sudden swelling of tissues. It is important to be aware of all triggers or causes for your angioedema and to avoid them. Treatment for this condition depends on the cause and severity of your symptoms. Severe angioedema can be life-threatening if it blocks the air passages. This information is not intended to replace advice given to you by your health care provider. Make sure you discuss any questions you have with your health care provider. Document Revised: 07/31/2020 Document Reviewed: 07/31/2020 Elsevier Patient Education  [employer].

## 2022-06-10 ENCOUNTER — Encounter: Payer: Self-pay | Admitting: Oncology

## 2022-06-10 ENCOUNTER — Inpatient Hospital Stay: Payer: 59 | Attending: Oncology

## 2022-06-10 ENCOUNTER — Inpatient Hospital Stay (HOSPITAL_BASED_OUTPATIENT_CLINIC_OR_DEPARTMENT_OTHER): Payer: 59 | Admitting: Oncology

## 2022-06-10 ENCOUNTER — Inpatient Hospital Stay: Payer: 59

## 2022-06-10 VITALS — BP 159/93 | HR 64 | Temp 96.7°F | Resp 18 | Wt 209.5 lb

## 2022-06-10 DIAGNOSIS — Z720 Tobacco use: Secondary | ICD-10-CM

## 2022-06-10 DIAGNOSIS — F17210 Nicotine dependence, cigarettes, uncomplicated: Secondary | ICD-10-CM | POA: Insufficient documentation

## 2022-06-10 DIAGNOSIS — D751 Secondary polycythemia: Secondary | ICD-10-CM | POA: Diagnosis not present

## 2022-06-10 DIAGNOSIS — G4730 Sleep apnea, unspecified: Secondary | ICD-10-CM | POA: Insufficient documentation

## 2022-06-10 DIAGNOSIS — G4739 Other sleep apnea: Secondary | ICD-10-CM | POA: Diagnosis not present

## 2022-06-10 LAB — CBC WITH DIFFERENTIAL/PLATELET
Abs Immature Granulocytes: 0.03 10*3/uL (ref 0.00–0.07)
Basophils Absolute: 0 10*3/uL (ref 0.0–0.1)
Basophils Relative: 1 %
Eosinophils Absolute: 0.2 10*3/uL (ref 0.0–0.5)
Eosinophils Relative: 2 %
HCT: 52.4 % — ABNORMAL HIGH (ref 39.0–52.0)
Hemoglobin: 17.4 g/dL — ABNORMAL HIGH (ref 13.0–17.0)
Immature Granulocytes: 0 %
Lymphocytes Relative: 13 %
Lymphs Abs: 1 10*3/uL (ref 0.7–4.0)
MCH: 31.5 pg (ref 26.0–34.0)
MCHC: 33.2 g/dL (ref 30.0–36.0)
MCV: 94.9 fL (ref 80.0–100.0)
Monocytes Absolute: 0.8 10*3/uL (ref 0.1–1.0)
Monocytes Relative: 9 %
Neutro Abs: 6.1 10*3/uL (ref 1.7–7.7)
Neutrophils Relative %: 75 %
Platelets: 237 10*3/uL (ref 150–400)
RBC: 5.52 MIL/uL (ref 4.22–5.81)
RDW: 16.4 % — ABNORMAL HIGH (ref 11.5–15.5)
WBC: 8.1 10*3/uL (ref 4.0–10.5)
nRBC: 0 % (ref 0.0–0.2)

## 2022-06-10 NOTE — Progress Notes (Signed)
Hematology/Oncology Progress note [phone] [phone]      Patient Care Team: Teodora Medici, DO as PCP - General (Internal Medicine) [place], Joshua Schmidt as PCP - Cardiology (Cardiology)  ASSESSMENT & PLAN:   Erythrocytosis Labs reviewed and discussed with patient. Continue monitor and proceed with phlebotomy if hematocrit is >-=52 Procedure phlebotomy today  Tobacco abuse Discussed with patient about smoke cessation.  Encouraged his efforts. Patient follows with lung cancer screening program for annual CT chest low-dose  Sleep apnea Patient declined CPAP machine  Orders Placed This Encounter  Procedures   Hemoglobin and Hematocrit (Cancer Center Only)    Standing Status:   Future    Standing Expiration Date:   06/11/2023   CBC with Differential (Cancer Center Only)    Standing Status:   Future    Standing Expiration Date:   06/11/2023   Follow-up in 4 months, lab phlebotomy 8 months lab Joshua Schmidt lobotomy  All questions were answered. The patient knows to call the clinic with any problems, questions or concerns.  Joshua Server, Joshua Schmidt, Joshua Schmidt   CHIEF COMPLAINTS/REASON FOR VISIT:  Follow up for secondary erythrocytosis.  HISTORY OF PRESENTING ILLNESS:  Joshua Schmidt is a 72 y.o. male who was seen in consultation at the request of Cletis Athens, Joshua Schmidt for evaluation of polycytosis/erythrocytosis Patient had lab work done with primary care provider and was found to have high hemoglobin.  Lab results are not available to me.  Patient was referred to heme-onc for further evaluation and discussion. Associated signs or symptoms: Denies weight loss, fever, chills, fatigue, night sweats.   Context:  Smoking history: 76.5 pack year smoking history, he reports that he quitted in the past and then smoke again Testosterone supplements: Denies History of blood clots: Denies Daytime somnolence: Denies Family history of  polycythemia: Denies Denies any personal history of thrombosis, stroke, heart attack.  Denies any skin itchiness.  He is a retired Clinical biochemist, remains active.  Mainly works outdoors. Sleep apnea, not interested in using CPAP machine  Intermittent right ankle swelling, varicose vein, previously seen by vascular surgeon and status post laser ablation of the right great saphenous vein on 05/05/2021.  Post laser ultrasound showed no DVT. Status post sclerotherapy.  INTERVAL HISTORY Joshua Schmidt is a 72 y.o. male who has above history reviewed by me today presents for follow up visit for management of erythrocytosis Patient smokes about 1 pack of cigarettes and sometimes he smokes cigars. He does not use CPAP machine Patient has no new complaints.  He feels well today.    Review of Systems  Constitutional:  Negative for appetite change, chills, diaphoresis, fatigue, fever and unexpected weight change.  HENT:   Negative for hearing loss, lump/mass, nosebleeds, sore throat and voice change.   Eyes:  Negative for eye problems and icterus.  Respiratory:  Negative for chest tightness, cough, hemoptysis, shortness of breath and wheezing.   Cardiovascular:  Positive for leg swelling. Negative for chest pain.  Gastrointestinal:  Negative for abdominal distention, abdominal pain, blood in stool, diarrhea, nausea and rectal pain.  Endocrine: Negative for hot flashes.  Genitourinary:  Negative for bladder incontinence, difficulty urinating, dysuria, frequency, hematuria and nocturia.   Musculoskeletal:  Negative for arthralgias, back pain, flank pain, gait problem and myalgias.  Skin:  Negative for itching and rash.  Neurological:  Negative for dizziness, gait problem, headaches, light-headedness, numbness and seizures.  Hematological:  Negative for adenopathy. Does not bruise/bleed easily.  Psychiatric/Behavioral:  Negative for confusion and decreased concentration. The patient is not  nervous/anxious.     MEDICAL HISTORY:  Past Medical History:  Diagnosis Date   Aneurysm ([place])    Erythrocytosis 12/01/2018   Hyperlipidemia    Hypertension    Myocardial infarction [facility])     SURGICAL HISTORY: Past Surgical History:  Procedure Laterality Date   APPENDECTOMY     COLONOSCOPY WITH PROPOFOL N/A 11/20/2014   Procedure: COLONOSCOPY WITH PROPOFOL;  Surgeon: Christene Lye, Joshua Schmidt;  Location: [facility] ENDOSCOPY;  Service: Endoscopy;  Laterality: N/A;   COLONOSCOPY WITH PROPOFOL N/A 03/19/2022   Procedure: COLONOSCOPY WITH PROPOFOL;  Surgeon: Lin Landsman, Joshua Schmidt;  Location: [facility] ENDOSCOPY;  Service: Gastroenterology;  Laterality: N/A;   TONSILLECTOMY AND ADENOIDECTOMY      SOCIAL HISTORY: Social History   Socioeconomic History   Marital status: Married    Spouse name: Not on file   Number of children: Not on file   Years of education: Not on file   Highest education level: Not on file  Occupational History   Not on file  Tobacco Use   Smoking status: Every Day    Packs/day: 1.00    Years: 51.00    Total pack years: 51.00    Types: Cigarettes   Smokeless tobacco: Never   Tobacco comments:    1ppd currently  Vaping Use   Vaping Use: Never used  Substance and Sexual Activity   Alcohol use: Yes   Drug use: No   Sexual activity: Not Currently  Other Topics Concern   Not on file  Social History Narrative   Not on file   Social Determinants of Health   Financial Resource Strain: Low Risk  (11/06/2021)   Overall Financial Resource Strain (CARDIA)    Difficulty of Paying Living Expenses: Not very hard  Food Insecurity: No Food Insecurity (11/06/2021)   Hunger Vital Sign    Worried About Running Out of Food in the Last Year: Never true    Ran Out of Food in the Last Year: Never true  Transportation Needs: No Transportation Needs (11/06/2021)   PRAPARE - Hydrologist (Medical): No    Lack of Transportation (Non-Medical): No   Physical Activity: Sufficiently Active (11/06/2021)   Exercise Vital Sign    Days of Exercise per Week: 3 days    Minutes of Exercise per Session: 90 min  Stress: No Stress Concern Present (11/06/2021)   [place]    Feeling of Stress : Only a little  Social Connections: Unknown (11/06/2021)   Social Connection and Isolation Panel [NHANES]    Frequency of Communication with Friends and Family: Three times a week    Frequency of Social Gatherings with Friends and Family: More than three times a week    Attends Religious Services: Not on file    Active Member of Clubs or Organizations: No    Attends Archivist Meetings: Never    Marital Status: Married  Human resources officer Violence: Not on file    FAMILY HISTORY: History reviewed. No pertinent family history.  ALLERGIES:  has No Known Allergies.  MEDICATIONS:  Current Outpatient Medications  Medication Sig Dispense Refill   aspirin 81 MG chewable tablet Chew 81 mg by mouth daily.     atorvastatin (LIPITOR) 20 MG tablet TAKE 1 TABLET BY MOUTH  DAILY 90 tablet 3   carvedilol (COREG) 12.5 MG tablet Take 1 tablet (12.5 mg  total) by mouth 2 (two) times daily. 180 tablet 3   fluticasone (FLONASE) 50 MCG/ACT nasal spray Place 2 sprays into both nostrils daily. 16 g 6   hydrochlorothiazide (HYDRODIURIL) 25 MG tablet TAKE 1 TABLET BY MOUTH DAILY 100 tablet 2   losartan (COZAAR) 50 MG tablet Take 1 tablet (50 mg total) by mouth daily. 30 tablet 1   No current facility-administered medications for this visit.     PHYSICAL EXAMINATION: ECOG PERFORMANCE STATUS: 0 - Asymptomatic Vitals:   06/10/22 1307  BP: (!) 159/93  Pulse: 64  Resp: 18  Temp: (!) 96.7 F (35.9 C)  SpO2: 93%   Filed Weights   06/10/22 1307  Weight: 209 lb 8 oz (95 kg)    Physical Exam Constitutional:      General: He is not in acute distress. HENT:     Head: Normocephalic and  atraumatic.  Eyes:     General: No scleral icterus. Cardiovascular:     Rate and Rhythm: Normal rate.  Pulmonary:     Effort: Pulmonary effort is normal. No respiratory distress.     Breath sounds: No wheezing.  Abdominal:     General: There is no distension.     Palpations: Abdomen is soft.  Musculoskeletal:        General: No deformity. Normal range of motion.     Cervical back: Normal range of motion and neck supple.  Skin:    General: Skin is warm and dry.     Findings: No erythema.  Neurological:     Mental Status: He is alert and oriented to person, place, and time. Mental status is at baseline.     Cranial Nerves: No cranial nerve deficit.  Psychiatric:        Mood and Affect: Mood normal.     RADIOGRAPHIC STUDIES: I have personally reviewed the radiological images as listed and agreed with the findings in the report. NM Myocar Multi W/Spect W/Wall Motion / EF  Result Date: 05/27/2022   Abnormal, probably low risk pharmacologic myocardial perfusion stress test.   There is a small in size, moderate in severity, partially reversible apical inferior and apical lateral defect most consistent with scar and peri-infarct ischemia but cannot rule out an element of artifact.   Left ventricular systolic function is normal (LVEF 60-65%).   There is extensive coronary artery calcification as well as aortic atherosclerosis.     LABORATORY DATA:  I have reviewed the data as listed    Latest Ref Rng & Units 06/10/2022   12:58 PM 01/22/2022    2:24 PM 10/22/2021    1:26 PM  CBC  WBC 4.0 - 10.5 K/uL 8.1  8.8  8.1   Hemoglobin 13.0 - 17.0 g/dL 17.4  17.7  16.0   Hematocrit 39.0 - 52.0 % 52.4  54.8  50.3   Platelets 150 - 400 K/uL 237  218  205       Latest Ref Rng & Units 05/06/2022    1:47 PM 01/22/2022    2:24 PM 10/21/2021   11:45 AM  CMP  Glucose 70 - 99 mg/dL  108  96   BUN 8 - 23 mg/dL  12  12   Creatinine 0.61 - 1.24 mg/dL 0.90  0.67  0.78   Sodium 135 - 145 mmol/L  139   139   Potassium 3.5 - 5.1 mmol/L  3.9  4.8   Chloride 98 - 111 mmol/L  100  101   CO2 22 -  32 mmol/L  30  28   Calcium 8.9 - 10.3 mg/dL  9.0  8.6   Total Protein 6.5 - 8.1 g/dL  7.3  7.2   Total Bilirubin 0.3 - 1.2 mg/dL  0.6  0.3   Alkaline Phos 38 - 126 U/L  58    AST 15 - 41 U/L  24  21   ALT 0 - 44 U/L  20  19     Iron/TIBC/Ferritin/ %Sat    Component Value Date/Time   IRON 27 (L) 10/22/2021 1326   TIBC 428 10/22/2021 1326   FERRITIN 8 (L) 10/22/2021 1326   IRONPCTSAT 6 (L) 10/22/2021 1326

## 2022-06-10 NOTE — Assessment & Plan Note (Signed)
Discussed with patient about smoke cessation.  Encouraged his efforts. Patient follows with lung cancer screening program for annual CT chest low-dose

## 2022-06-10 NOTE — Assessment & Plan Note (Signed)
Labs reviewed and discussed with patient. Continue monitor and proceed with phlebotomy if hematocrit is >-=52 Procedure phlebotomy today

## 2022-06-10 NOTE — Assessment & Plan Note (Signed)
Patient declined CPAP machine

## 2022-06-18 ENCOUNTER — Ambulatory Visit: Payer: Self-pay | Admitting: Cardiovascular Disease

## 2022-06-19 ENCOUNTER — Telehealth: Payer: Self-pay | Admitting: Internal Medicine

## 2022-06-19 NOTE — Telephone Encounter (Unsigned)
Copied from [place] [id number]. Topic: Referral - Question >> Jun 19, 2022  3:26 PM Leone Payor F wrote: Reason for CRM: Pt is calling in because he was given a referral to [facility] and they told him they don't accept his insurance. Pt wants to know if Dr. Rosana Berger can refer him to another Dermatologist that does accept his insurance.

## 2022-06-29 NOTE — Progress Notes (Unsigned)
Established Patient Office Visit  Subjective    Patient ID: Joshua Schmidt, male    DOB: 01-19-1951  Age: 72 y.o. MRN: [id number]  CC:  No chief complaint on file.   HPI Joshua Schmidt presents to follow up.  Hypertension: -Medications: Coreg 12.5 mg BID, HCTZ 25 mg, Lisinopril 20 mg discontinued at LOV due to facial swelling, now on Losartan 50 mg -Patient is compliant with above medications and reports no side effects. -Checking BP at home (average): 130-140/80-90 -Denies any SOB, CP, vision changes, LE edema or symptoms of hypotension -Following with Cardiology, last seen on 05/19/22  HLD/History of MI/Aortic and Iliac Aneurysms/Chronic Venous Insufficiency:   -Medications: Lipitor 20 mg, aspirin 81 mg -Patient is compliant with above medications and reports no side effects.  -History of MI in 2022 for which he did not seek medical attention at the time, was diagnosed retroactively  -Following with vascular surgery for abnormal ABI's -Planning for nuclear study next week to determine bicuspid aortic valve and if surgical repair is the best course of action -Last lipid panel: Lipid Panel     Component Value Date/Time   CHOL 133 10/21/2021 1145   TRIG 63 10/21/2021 1145   HDL 69 10/21/2021 1145   CHOLHDL 1.9 10/21/2021 1145   LDLCALC 50 10/21/2021 1145   Erythrocytosis:  -Following with Hematology, last seen 01/22/22 -Has labs monthly, sometimes requires phlebotomy but hasn't had to have one in 2 months.  -Last CBC 10/23 hgb 17.7   COPD: -COPD status: stable -Current medications: Nothing  -Oxygen use: no -Dyspnea frequency: None -Cough frequency: Daily, productive  -Limitation of activity: no -Pneumovax: Up to Date -Influenza: Up to Date -Annual lung cancer screening: 10/23 Lung-RADS-2  -Current smoker but trying to decrease  -Does have some post nasal drip, does will with Flonase   PTSD: -Not currently on medication -Had done counseling in the past but not  interested it anything now  Health Maintance: -Blood work UTD -Colon cancer screening: colonoscopy 12/23, 3 polyps removed, plan tto follow up in 1 year   Outpatient Encounter Medications as of 06/30/2022  Medication Sig   aspirin 81 MG chewable tablet Chew 81 mg by mouth daily.   atorvastatin (LIPITOR) 20 MG tablet TAKE 1 TABLET BY MOUTH  DAILY   carvedilol (COREG) 12.5 MG tablet Take 1 tablet (12.5 mg total) by mouth 2 (two) times daily.   fluticasone (FLONASE) 50 MCG/ACT nasal spray Place 2 sprays into both nostrils daily.   hydrochlorothiazide (HYDRODIURIL) 25 MG tablet TAKE 1 TABLET BY MOUTH DAILY   losartan (COZAAR) 50 MG tablet Take 1 tablet (50 mg total) by mouth daily.   No facility-administered encounter medications on file as of 06/30/2022.    Past Medical History:  Diagnosis Date   Aneurysm ([place])    Erythrocytosis 12/01/2018   Hyperlipidemia    Hypertension    Myocardial infarction [facility])     Past Surgical History:  Procedure Laterality Date   APPENDECTOMY     COLONOSCOPY WITH PROPOFOL N/A 11/20/2014   Procedure: COLONOSCOPY WITH PROPOFOL;  Surgeon: Christene Lye, MD;  Location: [facility] ENDOSCOPY;  Service: Endoscopy;  Laterality: N/A;   COLONOSCOPY WITH PROPOFOL N/A 03/19/2022   Procedure: COLONOSCOPY WITH PROPOFOL;  Surgeon: Lin Landsman, MD;  Location: [facility] ENDOSCOPY;  Service: Gastroenterology;  Laterality: N/A;   TONSILLECTOMY AND ADENOIDECTOMY      No family history on file.  Social History   Socioeconomic History   Marital status: Married  Spouse name: Not on file   Number of children: Not on file   Years of education: Not on file   Highest education level: Not on file  Occupational History   Not on file  Tobacco Use   Smoking status: Every Day    Packs/day: 1.00    Years: 51.00    Additional pack years: 0.00    Total pack years: 51.00    Types: Cigarettes   Smokeless tobacco: Never   Tobacco comments:    1ppd currently  Vaping Use    Vaping Use: Never used  Substance and Sexual Activity   Alcohol use: Yes   Drug use: No   Sexual activity: Not Currently  Other Topics Concern   Not on file  Social History Narrative   Not on file   Social Determinants of Health   Financial Resource Strain: Low Risk  (11/06/2021)   Overall Financial Resource Strain (CARDIA)    Difficulty of Paying Living Expenses: Not very hard  Food Insecurity: No Food Insecurity (11/06/2021)   Hunger Vital Sign    Worried About Running Out of Food in the Last Year: Never true    Ran Out of Food in the Last Year: Never true  Transportation Needs: No Transportation Needs (11/06/2021)   PRAPARE - Hydrologist (Medical): No    Lack of Transportation (Non-Medical): No  Physical Activity: Sufficiently Active (11/06/2021)   Exercise Vital Sign    Days of Exercise per Week: 3 days    Minutes of Exercise per Session: 90 min  Stress: No Stress Concern Present (11/06/2021)   [place]    Feeling of Stress : Only a little  Social Connections: Unknown (11/06/2021)   Social Connection and Isolation Panel [NHANES]    Frequency of Communication with Friends and Family: Three times a week    Frequency of Social Gatherings with Friends and Family: More than three times a week    Attends Religious Services: Not on file    Active Member of Clubs or Organizations: No    Attends Archivist Meetings: Never    Marital Status: Married  Human resources officer Violence: Not on file    Review of Systems  Constitutional:  Negative for chills and fever.  Eyes:  Negative for blurred vision.  Respiratory:  Positive for cough and shortness of breath. Negative for sputum production.   Cardiovascular:  Negative for chest pain, palpitations and leg swelling.  Gastrointestinal:  Negative for abdominal pain.        Objective    There were no vitals taken for this  visit.  Physical Exam Constitutional:      Appearance: Normal appearance.  HENT:     Head: Normocephalic and atraumatic.  Eyes:     Conjunctiva/sclera: Conjunctivae normal.  Cardiovascular:     Rate and Rhythm: Normal rate and regular rhythm.  Pulmonary:     Effort: Pulmonary effort is normal.     Breath sounds: Normal breath sounds.  Musculoskeletal:     Right lower leg: No edema.     Left lower leg: No edema.  Skin:    General: Skin is warm and dry.     Findings: Lesion present.     Comments: Calcified lesion on forehead with multiple AK's on face  Neurological:     General: No focal deficit present.     Mental Status: He is alert. Mental status is at baseline.  Psychiatric:        Mood and Affect: Mood normal.        Behavior: Behavior normal.     Last CBC Lab Results  Component Value Date   WBC 8.1 06/10/2022   HGB 17.4 (H) 06/10/2022   HCT 52.4 (H) 06/10/2022   MCV 94.9 06/10/2022   MCH 31.5 06/10/2022   RDW 16.4 (H) 06/10/2022   PLT 237 [id number]   Last metabolic panel Lab Results  Component Value Date   GLUCOSE 108 (H) 01/22/2022   NA 139 01/22/2022   K 3.9 01/22/2022   CL 100 01/22/2022   CO2 30 01/22/2022   BUN 12 01/22/2022   CREATININE 0.90 05/06/2022   GFRNONAA >60 01/22/2022   CALCIUM 9.0 01/22/2022   PROT 7.3 01/22/2022   ALBUMIN 3.6 01/22/2022   BILITOT 0.6 01/22/2022   ALKPHOS 58 01/22/2022   AST 24 01/22/2022   ALT 20 01/22/2022   ANIONGAP 9 01/22/2022   Last lipids Lab Results  Component Value Date   CHOL 133 10/21/2021   HDL 69 10/21/2021   LDLCALC 50 10/21/2021   TRIG 63 10/21/2021   CHOLHDL 1.9 10/21/2021   Last hemoglobin A1c No results found for: "HGBA1C" Last thyroid functions Lab Results  Component Value Date   TSH 0.69 10/21/2021   Last vitamin D No results found for: "25OHVITD2", "25OHVITD3", "VD25OH" Last vitamin B12 and Folate No results found for: "VITAMINB12", "FOLATE"      Assessment & Plan:   1.  Hypertension, unspecified type: Chronic and stable, just switched to Coreg 12.5 mg BID, HCTZ 25 mg and Lisinopril 20 mg, no changes made today.  2. Aneurysm of ascending aorta without rupture (HCC)/Chronic venous insufficiency: Following with Cardiology, note from 05/19/22 reviewed with plans for nuclear study next week.  3. Coronary artery disease involving native heart without angina pectoris, unspecified vessel or lesion type: Currently on Lipitor 20 mg, aspirin.   4. Erythrocytosis: Following with Hematology, note and labs from 01/22/22 reviewed, planning on repeating labs soon.  5. Skin lesion of face: Referral to Dermatology placed.   - Ambulatory referral to Dermatology  6. Sinus drainage: Prescribe Flonase to use daily.   - fluticasone (FLONASE) 50 MCG/ACT nasal spray; Place 2 sprays into both nostrils daily.  Dispense: 16 g; Refill: 6  7. Tobacco use: Working on cutting back on smoking, down to about 1/2 pack per day. Discussed different medical therapies to help with cravings but patient declined.   8. PTSD (post-traumatic stress disorder): Discussed restarting counseling services and potentially starting medications for symptoms, patient declines today. Will continue to monitor.    No follow-ups on file.   Teodora Medici, DO

## 2022-06-30 ENCOUNTER — Encounter: Payer: Self-pay | Admitting: Internal Medicine

## 2022-06-30 ENCOUNTER — Ambulatory Visit (INDEPENDENT_AMBULATORY_CARE_PROVIDER_SITE_OTHER): Payer: 59 | Admitting: Internal Medicine

## 2022-06-30 VITALS — BP 135/78 | HR 70 | Temp 98.1°F | Resp 16 | Ht 75.0 in | Wt 203.6 lb

## 2022-06-30 DIAGNOSIS — E782 Mixed hyperlipidemia: Secondary | ICD-10-CM

## 2022-06-30 DIAGNOSIS — I10 Essential (primary) hypertension: Secondary | ICD-10-CM

## 2022-06-30 MED ORDER — LOSARTAN POTASSIUM 50 MG PO TABS: 50.0000 mg | ORAL_TABLET | Freq: Every day | ORAL | 1 refills | Status: DC

## 2022-06-30 MED ORDER — ATORVASTATIN CALCIUM 20 MG PO TABS: 20.0000 mg | ORAL_TABLET | Freq: Every day | ORAL | 1 refills | Status: DC

## 2022-07-02 ENCOUNTER — Telehealth: Payer: Self-pay | Admitting: Internal Medicine

## 2022-07-02 NOTE — Telephone Encounter (Signed)
Called patient to schedule Medicare Annual Wellness Visit (AWV). Left message for patient to call back and schedule Medicare Annual Wellness Visit (AWV).  Last date of AWV: 11/06/2021  Please schedule an appointment at any time with [facility].  If any questions, please contact me at [phone].  Thank you ,  [place] Direct Dial: [phone]

## 2022-08-18 ENCOUNTER — Ambulatory Visit: Payer: 59 | Attending: Cardiovascular Disease | Admitting: Cardiovascular Disease

## 2022-08-18 ENCOUNTER — Encounter: Payer: Self-pay | Admitting: Oncology

## 2022-08-18 ENCOUNTER — Encounter: Payer: Self-pay | Admitting: Cardiovascular Disease

## 2022-08-18 VITALS — BP 134/80 | HR 75 | Ht 75.0 in | Wt 202.5 lb

## 2022-08-18 DIAGNOSIS — I10 Essential (primary) hypertension: Secondary | ICD-10-CM

## 2022-08-18 DIAGNOSIS — L728 Other follicular cysts of the skin and subcutaneous tissue: Secondary | ICD-10-CM | POA: Diagnosis not present

## 2022-08-18 DIAGNOSIS — I723 Aneurysm of iliac artery: Secondary | ICD-10-CM

## 2022-08-18 DIAGNOSIS — Z720 Tobacco use: Secondary | ICD-10-CM

## 2022-08-18 DIAGNOSIS — E785 Hyperlipidemia, unspecified: Secondary | ICD-10-CM

## 2022-08-18 DIAGNOSIS — D692 Other nonthrombocytopenic purpura: Secondary | ICD-10-CM | POA: Diagnosis not present

## 2022-08-18 DIAGNOSIS — L821 Other seborrheic keratosis: Secondary | ICD-10-CM | POA: Diagnosis not present

## 2022-08-18 DIAGNOSIS — I7121 Aneurysm of the ascending aorta, without rupture: Secondary | ICD-10-CM

## 2022-08-18 DIAGNOSIS — D485 Neoplasm of uncertain behavior of skin: Secondary | ICD-10-CM | POA: Diagnosis not present

## 2022-08-18 DIAGNOSIS — D2339 Other benign neoplasm of skin of other parts of face: Secondary | ICD-10-CM | POA: Diagnosis not present

## 2022-08-18 DIAGNOSIS — L578 Other skin changes due to chronic exposure to nonionizing radiation: Secondary | ICD-10-CM | POA: Diagnosis not present

## 2022-08-18 MED ORDER — CARVEDILOL 12.5 MG PO TABS: 12.5000 mg | ORAL_TABLET | Freq: Two times a day (BID) | ORAL | 3 refills | Status: DC

## 2022-08-18 NOTE — Progress Notes (Signed)
Cardiology Office Note   Date:  08/20/2022   ID:  Joshua Schmidt, DOB 1950-06-28, MRN [id number]  PCP:  Margarita Mail, DO  Cardiologist:   Lorine Bears, MD   Chief Complaint  Patient presents with   Follow-up    3 month f/u no complaints today. Pt denies taking Losartan 50 mg. Meds reviewed verbally with pt.      History of Present Illness: Joshua Schmidt is a 72 y.o. male who is here today for follow-up visit regarding aortic and iliac aneurysms and possible bicuspid aortic valve.   The patient reports possibly having myocardial infarction at home years ago but did not seek medical attention.  He has chronic medical conditions that include essential hypertension, hyperlipidemia, tobacco use and COPD.  He has no family history of aortic aneurysm or coronary artery disease.  He used to smoke 1-1/2 pack/day but has been trying to cut down and is currently down to half a pack per day. He has been getting CT scan of the lungs for cancer screening and was noted to have dilated ascending aorta on recent imaging measured at 4.7 cm.  In addition, he was noted to have aortic and coronary calcifications and calcified mitral valve annulus.  His pulmonary artery was noted to be dilated.  He also has history of chronic venous insufficiency with normal ABI.  He is status post sclerotherapy involving the right lower extremity. Due to concerns about dilated pulmonary artery, he underwent an echocardiogram in January of this year which showed normal LV systolic function with grade 1 diastolic dysfunction and possible bicuspid aortic valve. Abdominal aortic ultrasound showed evidence of infrarenal  abdominal aortic aneurysm as well as bilateral iliac aneurysms.  This was followed by CTA which showed tortuous aorta with infrarenal abdominal aortic aneurysm measuring 4.2 cm.  In addition, the right common iliac artery was aneurysmal with a diameter of 3.3 cm and the left common iliac artery had a  diameter of 2.3 cm.  He underwent a Lexiscan Myoview in February which showed no evidence of ischemia with normal ejection fraction.  He was seen by Dr. Karin Lieu from vascular surgery with recommendations to follow his aneurysmal disease with plans for intervention once his iliac aneurysm exceeds 3.5 cm.  He denies chest pain.  He reports improvement in shortness of breath.   Past Medical History:  Diagnosis Date   Aneurysm (HCC)    Erythrocytosis 12/01/2018   Hyperlipidemia    Hypertension    Myocardial infarction [facility])     Past Surgical History:  Procedure Laterality Date   APPENDECTOMY     COLONOSCOPY WITH PROPOFOL N/A 11/20/2014   Procedure: COLONOSCOPY WITH PROPOFOL;  Surgeon: Kieth Brightly, MD;  Location: [facility] ENDOSCOPY;  Service: Endoscopy;  Laterality: N/A;   COLONOSCOPY WITH PROPOFOL N/A 03/19/2022   Procedure: COLONOSCOPY WITH PROPOFOL;  Surgeon: Toney Reil, MD;  Location: [facility] ENDOSCOPY;  Service: Gastroenterology;  Laterality: N/A;   TONSILLECTOMY AND ADENOIDECTOMY       Current Outpatient Medications  Medication Sig Dispense Refill   aspirin 81 MG chewable tablet Chew 81 mg by mouth daily.     atorvastatin (LIPITOR) 20 MG tablet Take 1 tablet (20 mg total) by mouth daily. 90 tablet 1   fluticasone (FLONASE) 50 MCG/ACT nasal spray Place 2 sprays into both nostrils daily. 16 g 6   hydrochlorothiazide (HYDRODIURIL) 25 MG tablet TAKE 1 TABLET BY MOUTH DAILY 100 tablet 2   carvedilol (COREG) 12.5 MG tablet Take  1 tablet (12.5 mg total) by mouth 2 (two) times daily. 180 tablet 3   losartan (COZAAR) 50 MG tablet Take 1 tablet (50 mg total) by mouth daily. (Patient not taking: Reported on 08/18/2022) 90 tablet 1   No current facility-administered medications for this visit.    Allergies:   Patient has no known allergies.    Social History:  The patient  reports that he has been smoking cigarettes. He has a 51.00 pack-year smoking history. He has never used  smokeless tobacco. He reports current alcohol use. He reports that he does not use drugs.   Family History:  The patient's family history is not on file.    ROS:  Please see the history of present illness.   Otherwise, review of systems are positive for none.   All other systems are reviewed and negative.    PHYSICAL EXAM: VS:  BP 134/80 (BP Location: Left Arm, Patient Position: Sitting, Cuff Size: Normal)   Pulse 75   Ht 6\' 3"  (1.905 m)   Wt 202 lb 8 oz (91.9 kg)   SpO2 96%   BMI 25.31 kg/m  , BMI Body mass index is 25.31 kg/m. GEN: Well nourished, well developed, in no acute distress  HEENT: normal  Neck: no JVD, carotid bruits, or masses Cardiac: RRR; no  rubs, or gallops,no edema .  1/6 systolic murmur in the aortic area. Respiratory:  clear to auscultation bilaterally, normal work of breathing GI: soft, nontender, nondistended, + BS MS: no deformity or atrophy  Skin: warm and dry, no rash Neuro:  Strength and sensation are intact Psych: euthymic mood, full affect   EKG:  EKG is not ordered today. The ekg ordered today demonstrates sinus rhythm with first-degree AV block, left axis deviation   Recent Labs: 10/21/2021: TSH 0.69 01/22/2022: ALT 20; BUN 12; Potassium 3.9; Sodium 139 05/06/2022: Creatinine, Ser 0.90 06/10/2022: Hemoglobin 17.4; Platelets 237    Lipid Panel    Component Value Date/Time   CHOL 133 10/21/2021 1145   TRIG 63 10/21/2021 1145   HDL 69 10/21/2021 1145   CHOLHDL 1.9 10/21/2021 1145   LDLCALC 50 10/21/2021 1145      Wt Readings from Last 3 Encounters:  08/18/22 202 lb 8 oz (91.9 kg)  06/30/22 203 lb 9.6 oz (92.4 kg)  06/10/22 209 lb 8 oz (95 kg)          02/17/2022    1:54 PM  PAD Screen  Previous PAD dx? Yes  Previous surgical procedure? Yes  Pain with walking? No  Feet/toe relief with dangling? No  Painful, non-healing ulcers? No  Extremities discolored? No      ASSESSMENT AND PLAN:  1.  Ascending aortic aneurysm: This  is below threshold for surgical repair.  If we confirm bicuspid aortic valve, the threshold for surgical repair is 5 cm.   He will require a TEE later this year.  He is overwhelmed with too much testing at this time and I think it is reasonable to wait another 6 months.  I discussed the procedure with him.  2.  Exertional dyspnea and abnormal EKG: Echocardiogram showed normal LV systolic function and possible bicuspid aortic valve.  Lexiscan Myoview showed no evidence of ischemia.  He reports improvement in his symptoms.  3.  Essential hypertension: He reports difficult to control blood pressure.  I switched from metoprolol to carvedilol during last visit.  However, he continues to take metoprolol.  I asked him to stop this.  In  addition, he is not sure if he is taking losartan or not .  He will go home and double check and let us know if he is not taking losartan so a prescription can be sent.  4.  Hyperlipidemia: Continue atorvastatin 20 mg once daily.  I reviewed most recent lipid profile which showed an LDL of 50 which is at target.  5.  Tobacco use: I discussed with him the importance of smoking cessation.  6.  Abdominal aortic and iliac artery aneurysm:  his right iliac aneurysm is 3.3 cm.  He will be followed by vascular surgery every 6 months with plans to intervene once his iliac aneurysm exceeds 3.5 cm.   Disposition:   FU with me in 6 months  Signed,  Lorine Bears, MD  08/20/2022 11:06 AM    [facility]

## 2022-08-18 NOTE — Patient Instructions (Signed)
Medication Instructions:  Check if you are taking the Losartan 50 mg once daily and call us back at [phone] or send [place] a MyChart message  *If you need a refill on your cardiac medications before your next appointment, please call your pharmacy*   Lab Work: None ordered If you have labs (blood work) drawn today and your tests are completely normal, you will receive your results only by: MyChart Message (if you have MyChart) OR A paper copy in the mail If you have any lab test that is abnormal or we need to change your treatment, we will call you to review the results.   Testing/Procedures: None ordered   Follow-Up: At [facility], you and your health needs are our priority.  As part of our continuing mission to provide you with exceptional heart care, we have created designated Provider Care Teams.  These Care Teams include your primary Cardiologist (physician) and Advanced Practice Providers (APPs -  Physician Assistants and Nurse Practitioners) who all work together to provide you with the care you need, when you need it.  We recommend signing up for the patient portal called "MyChart".  Sign up information is provided on this After Visit Summary.  MyChart is used to connect with patients for Virtual Visits (Telemedicine).  Patients are able to view lab/test results, encounter notes, upcoming appointments, etc.  Non-urgent messages can be sent to your provider as well.   To learn more about what you can do with MyChart, go to [email or web address].    Your next appointment:   6 month(s)  Provider:   You may see Lorine Bears, MD or one of the following Advanced Practice Providers on your designated Care Team:   Nicolasa Ducking, NP Eula Listen, PA-C Cadence Fransico Michael, PA-C Charlsie Quest, NP

## 2022-09-03 ENCOUNTER — Other Ambulatory Visit: Payer: Self-pay | Admitting: Internal Medicine

## 2022-09-03 DIAGNOSIS — E782 Mixed hyperlipidemia: Secondary | ICD-10-CM

## 2022-09-04 NOTE — Telephone Encounter (Signed)
Requested medication (s) are due for refill today: no  Requested medication (s) are on the active medication list: yes  Last refill:  06/30/22 #90 1   Future visit scheduled: no  Notes to clinic:  can this be refilled for #100 tabs? Please review.    Requested Prescriptions  Pending Prescriptions Disp Refills   atorvastatin (LIPITOR) 20 MG tablet [Pharmacy Med Name: Atorvastatin Calcium 20 MG Oral Tablet] 100 tablet 2    Sig: TAKE 1 TABLET BY MOUTH DAILY     Cardiovascular:  Antilipid - Statins Failed - 09/03/2022 10:50 PM      Failed - Lipid Panel in normal range within the last 12 months    Cholesterol  Date Value Ref Range Status  10/21/2021 133 <200 mg/dL Final   LDL Cholesterol (Calc)  Date Value Ref Range Status  10/21/2021 50 mg/dL (calc) Final    Comment:    Reference range: <100 . Desirable range <100 mg/dL for primary prevention;   <70 mg/dL for patients with CHD or diabetic patients  with > or = 2 CHD risk factors. Marland Kitchen LDL-C is now calculated using the Martin-Hopkins  calculation, which is a validated novel method providing  better accuracy than the Friedewald equation in the  estimation of LDL-C.  Horald Pollen et al. Lenox Ahr. 1610;960(45): 2061-2068  (http://education.[employer].com/faq/FAQ164)    HDL  Date Value Ref Range Status  10/21/2021 69 > OR = 40 mg/dL Final   Triglycerides  Date Value Ref Range Status  10/21/2021 63 <150 mg/dL Final         Passed - Patient is not pregnant      Passed - Valid encounter within last 12 months    Recent Outpatient Visits           2 months ago Hypertension, unspecified type   [facility] Margarita Mail, DO   3 months ago Angioedema, initial encounter   [facility] Margarita Mail, DO   3 months ago Hypertension, unspecified type   [facility] Margarita Mail, DO       Future Appointments             In 3 months  Margarita Mail, DO [facility], [facility]

## 2022-09-21 ENCOUNTER — Other Ambulatory Visit: Payer: Self-pay | Admitting: Internal Medicine

## 2022-09-21 DIAGNOSIS — I10 Essential (primary) hypertension: Secondary | ICD-10-CM

## 2022-09-22 NOTE — Telephone Encounter (Signed)
Unable to refill per protocol, Rx request is too soon. Last refill 06/30/22 for 90 and 1 refill.  Requested Prescriptions  Pending Prescriptions Disp Refills   losartan (COZAAR) 50 MG tablet [Pharmacy Med Name: Losartan Potassium 50 MG Oral Tablet] 100 tablet 2    Sig: TAKE 1 TABLET BY MOUTH DAILY     Cardiovascular:  Angiotensin Receptor Blockers Failed - 09/21/2022  4:55 AM      Failed - K in normal range and within 180 days    Potassium  Date Value Ref Range Status  01/22/2022 3.9 3.5 - 5.1 mmol/L Final         Passed - Cr in normal range and within 180 days    Creat  Date Value Ref Range Status  10/21/2021 0.78 0.70 - 1.28 mg/dL Final   Creatinine, Ser  Date Value Ref Range Status  05/06/2022 0.90 0.61 - 1.24 mg/dL Final         Passed - Patient is not pregnant      Passed - Last BP in normal range    BP Readings from Last 1 Encounters:  08/18/22 134/80         Passed - Valid encounter within last 6 months    Recent Outpatient Visits           2 months ago Hypertension, unspecified type   [facility] Margarita Mail, DO   3 months ago Angioedema, initial encounter   [facility] Margarita Mail, DO   4 months ago Hypertension, unspecified type   [facility] Margarita Mail, DO       Future Appointments             In 3 months Margarita Mail, DO [facility]   In 4 months Kirke Corin, Chelsea Aus, MD [facility]

## 2022-09-25 ENCOUNTER — Telehealth: Payer: Self-pay | Admitting: Internal Medicine

## 2022-09-25 ENCOUNTER — Ambulatory Visit: Payer: Self-pay

## 2022-09-25 NOTE — Telephone Encounter (Signed)
    Chief Complaint: Shortness of breath with exertion, swelling to ankles. History of COPD. No availability with PCP until July. Asking to be worked in with her. Symptoms: Above Frequency: 1 week ago Pertinent Negatives: Patient denies  Disposition: [] ED /[] Urgent Care (no appt availability in office) / [] Appointment(In office/virtual)/ []  [place] [facility]/ [] Home Care/ [] Refused Recommended Disposition /[] [place] [facility]/ [x]  Follow-up with PCP Additional Notes: No available appointments. Pt. Wants to be worked in. Please advise pt.  Reason for Disposition  [1] MODERATE longstanding difficulty breathing (e.g., speaks in phrases, SOB even at rest, pulse 100-120) AND [2] SAME as normal  Answer Assessment - Initial Assessment Questions 1. RESPIRATORY STATUS: "Describe your breathing?" (e.g., wheezing, shortness of breath, unable to speak, severe coughing)      SOB 2. ONSET: "When did this breathing problem begin?"      1 Week ago 3. PATTERN "Does the difficult breathing come and go, or has it been constant since it started?"      Comes and goes 4. SEVERITY: "How bad is your breathing?" (e.g., mild, moderate, severe)    - MILD: No SOB at rest, mild SOB with walking, speaks normally in sentences, can lie down, no retractions, pulse < 100.    - MODERATE: SOB at rest, SOB with minimal exertion and prefers to sit, cannot lie down flat, speaks in phrases, mild retractions, audible wheezing, pulse 100-120.    - SEVERE: Very SOB at rest, speaks in single words, struggling to breathe, sitting hunched forward, retractions, pulse > 120      Mild 5. RECURRENT SYMPTOM: "Have you had difficulty breathing before?" If Yes, ask: "When was the last time?" and "What happened that time?"      Yes 6. CARDIAC HISTORY: "Do you have any history of heart disease?" (e.g., heart attack, angina, bypass surgery, angioplasty)      No 7. LUNG HISTORY: "Do you have any history of lung disease?"  (e.g.,  pulmonary embolus, asthma, emphysema)     COPD 8. CAUSE: "What do you think is causing the breathing problem?"      Unsure 9. OTHER SYMPTOMS: "Do you have any other symptoms? (e.g., dizziness, runny nose, cough, chest pain, fever)     Swelling to ankles 10. O2 SATURATION MONITOR:  "Do you use an oxygen saturation monitor (pulse oximeter) at home?" If Yes, ask: "What is your reading (oxygen level) today?" "What is your usual oxygen saturation reading?" (e.g., 95%)       No 11. PREGNANCY: "Is there any chance you are pregnant?" "When was your last menstrual period?"       N/a 12. TRAVEL: "Have you traveled out of the country in the last month?" (e.g., travel history, exposures)       No  Protocols used: Breathing Difficulty-A-AH

## 2022-09-25 NOTE — Telephone Encounter (Signed)
LVM from the nurse and dr.

## 2022-09-28 ENCOUNTER — Emergency Department: Payer: 59

## 2022-09-28 ENCOUNTER — Inpatient Hospital Stay
Admission: EM | Admit: 2022-09-28 | Discharge: 2022-10-05 | DRG: 189 | Disposition: A | Discharge status: Home / Self Care | Payer: 59 | Source: Ambulatory Visit | Attending: Internal Medicine | Admitting: Internal Medicine

## 2022-09-28 ENCOUNTER — Ambulatory Visit (INDEPENDENT_AMBULATORY_CARE_PROVIDER_SITE_OTHER): Payer: 59 | Admitting: Internal Medicine

## 2022-09-28 ENCOUNTER — Other Ambulatory Visit: Payer: Self-pay

## 2022-09-28 ENCOUNTER — Encounter: Payer: Self-pay | Admitting: Internal Medicine

## 2022-09-28 VITALS — BP 142/82 | HR 67 | Temp 98.0°F | Resp 18 | Ht 75.0 in | Wt 214.3 lb

## 2022-09-28 DIAGNOSIS — I2510 Atherosclerotic heart disease of native coronary artery without angina pectoris: Secondary | ICD-10-CM | POA: Diagnosis present

## 2022-09-28 DIAGNOSIS — I110 Hypertensive heart disease with heart failure: Secondary | ICD-10-CM | POA: Diagnosis not present

## 2022-09-28 DIAGNOSIS — Z720 Tobacco use: Secondary | ICD-10-CM

## 2022-09-28 DIAGNOSIS — I5A Non-ischemic myocardial injury (non-traumatic): Secondary | ICD-10-CM | POA: Diagnosis present

## 2022-09-28 DIAGNOSIS — I10 Essential (primary) hypertension: Secondary | ICD-10-CM | POA: Diagnosis not present

## 2022-09-28 DIAGNOSIS — Z1152 Encounter for screening for COVID-19: Secondary | ICD-10-CM

## 2022-09-28 DIAGNOSIS — J45909 Unspecified asthma, uncomplicated: Secondary | ICD-10-CM | POA: Diagnosis not present

## 2022-09-28 DIAGNOSIS — D751 Secondary polycythemia: Secondary | ICD-10-CM | POA: Diagnosis not present

## 2022-09-28 DIAGNOSIS — I252 Old myocardial infarction: Secondary | ICD-10-CM

## 2022-09-28 DIAGNOSIS — Z86718 Personal history of other venous thrombosis and embolism: Secondary | ICD-10-CM | POA: Diagnosis not present

## 2022-09-28 DIAGNOSIS — I7121 Aneurysm of the ascending aorta, without rupture: Secondary | ICD-10-CM

## 2022-09-28 DIAGNOSIS — R062 Wheezing: Secondary | ICD-10-CM

## 2022-09-28 DIAGNOSIS — J9601 Acute respiratory failure with hypoxia: Principal | ICD-10-CM

## 2022-09-28 DIAGNOSIS — R600 Localized edema: Secondary | ICD-10-CM | POA: Diagnosis not present

## 2022-09-28 DIAGNOSIS — R0902 Hypoxemia: Secondary | ICD-10-CM | POA: Diagnosis not present

## 2022-09-28 DIAGNOSIS — R0602 Shortness of breath: Secondary | ICD-10-CM | POA: Diagnosis not present

## 2022-09-28 DIAGNOSIS — I2781 Cor pulmonale (chronic): Secondary | ICD-10-CM | POA: Diagnosis present

## 2022-09-28 DIAGNOSIS — E8779 Other fluid overload: Secondary | ICD-10-CM | POA: Diagnosis not present

## 2022-09-28 DIAGNOSIS — J441 Chronic obstructive pulmonary disease with (acute) exacerbation: Secondary | ICD-10-CM | POA: Diagnosis present

## 2022-09-28 DIAGNOSIS — R0609 Other forms of dyspnea: Secondary | ICD-10-CM | POA: Diagnosis not present

## 2022-09-28 DIAGNOSIS — E785 Hyperlipidemia, unspecified: Secondary | ICD-10-CM | POA: Diagnosis not present

## 2022-09-28 DIAGNOSIS — G4733 Obstructive sleep apnea (adult) (pediatric): Secondary | ICD-10-CM | POA: Diagnosis not present

## 2022-09-28 DIAGNOSIS — I5033 Acute on chronic diastolic (congestive) heart failure: Secondary | ICD-10-CM | POA: Diagnosis present

## 2022-09-28 DIAGNOSIS — R911 Solitary pulmonary nodule: Secondary | ICD-10-CM | POA: Diagnosis present

## 2022-09-28 DIAGNOSIS — Z7982 Long term (current) use of aspirin: Secondary | ICD-10-CM | POA: Diagnosis not present

## 2022-09-28 DIAGNOSIS — F17210 Nicotine dependence, cigarettes, uncomplicated: Secondary | ICD-10-CM | POA: Diagnosis not present

## 2022-09-28 DIAGNOSIS — F1010 Alcohol abuse, uncomplicated: Secondary | ICD-10-CM

## 2022-09-28 DIAGNOSIS — I82409 Acute embolism and thrombosis of unspecified deep veins of unspecified lower extremity: Secondary | ICD-10-CM | POA: Diagnosis present

## 2022-09-28 DIAGNOSIS — Z79899 Other long term (current) drug therapy: Secondary | ICD-10-CM

## 2022-09-28 DIAGNOSIS — J9811 Atelectasis: Secondary | ICD-10-CM | POA: Diagnosis not present

## 2022-09-28 DIAGNOSIS — Z87891 Personal history of nicotine dependence: Secondary | ICD-10-CM | POA: Diagnosis present

## 2022-09-28 DIAGNOSIS — E8770 Fluid overload, unspecified: Secondary | ICD-10-CM | POA: Diagnosis not present

## 2022-09-28 LAB — CBC
HCT: 53.6 % — ABNORMAL HIGH (ref 39.0–52.0)
Hemoglobin: 17.1 g/dL — ABNORMAL HIGH (ref 13.0–17.0)
MCH: 29.6 pg (ref 26.0–34.0)
MCHC: 31.9 g/dL (ref 30.0–36.0)
MCV: 92.7 fL (ref 80.0–100.0)
Platelets: 221 10*3/uL (ref 150–400)
RBC: 5.78 MIL/uL (ref 4.22–5.81)
RDW: 16.4 % — ABNORMAL HIGH (ref 11.5–15.5)
WBC: 8.5 10*3/uL (ref 4.0–10.5)
nRBC: 0 % (ref 0.0–0.2)

## 2022-09-28 LAB — RESP PANEL BY RT-PCR (FLU A&B, COVID) ARPGX2
Influenza A by PCR: NEGATIVE
Influenza B by PCR: NEGATIVE
SARS Coronavirus 2 by RT PCR: NEGATIVE

## 2022-09-28 LAB — BASIC METABOLIC PANEL
Anion gap: 8 (ref 5–15)
BUN: 14 mg/dL (ref 8–23)
CO2: 33 mmol/L — ABNORMAL HIGH (ref 22–32)
Calcium: 8.9 mg/dL (ref 8.9–10.3)
Chloride: 90 mmol/L — ABNORMAL LOW (ref 98–111)
Creatinine, Ser: 0.68 mg/dL (ref 0.61–1.24)
GFR, Estimated: >60 mL/min (ref >60)
Glucose, Bld: 124 mg/dL — ABNORMAL HIGH (ref 70–99)
Potassium: 4.3 mmol/L (ref 3.5–5.1)
Sodium: 131 mmol/L — ABNORMAL LOW (ref 135–145)

## 2022-09-28 LAB — TROPONIN I (HIGH SENSITIVITY)
Troponin I (High Sensitivity): 232 ng/L (ref <18)
Troponin I (High Sensitivity): 224 ng/L (ref <18)
Troponin I (High Sensitivity): 172 ng/L (ref <18)
Troponin I (High Sensitivity): 142 ng/L (ref <18)
Troponin I (High Sensitivity): 108 ng/L (ref <18)

## 2022-09-28 LAB — BRAIN NATRIURETIC PEPTIDE: B Natriuretic Peptide: 826 pg/mL — ABNORMAL HIGH (ref 0.0–100.0)

## 2022-09-28 LAB — MAGNESIUM: Magnesium: 1.8 mg/dL (ref 1.7–2.4)

## 2022-09-28 MED ORDER — LORAZEPAM 2 MG/ML IJ SOLN: 0.0000 mg | Freq: Two times a day (BID) | INTRAMUSCULAR | Status: AC

## 2022-09-28 MED ORDER — IPRATROPIUM-ALBUTEROL 0.5-2.5 (3) MG/3ML IN SOLN
3.0000 mL | Freq: Four times a day (QID) | RESPIRATORY_TRACT | Status: DC
  Administered 2022-09-29 – 2022-09-30 (×6): 3 mL via RESPIRATORY_TRACT
  Filled 2022-09-28 (×6): qty 3

## 2022-09-28 MED ORDER — ADULT MULTIVITAMIN W/MINERALS CH
1.0000 | ORAL_TABLET | Freq: Every day | ORAL | Status: DC
  Administered 2022-09-28 – 2022-10-05 (×8): 1 via ORAL
  Filled 2022-09-28 (×8): qty 1

## 2022-09-28 MED ORDER — LORAZEPAM 2 MG/ML IJ SOLN: 0.0000 mg | Freq: Four times a day (QID) | INTRAMUSCULAR | Status: AC

## 2022-09-28 MED ORDER — DM-GUAIFENESIN ER 30-600 MG PO TB12
1.0000 | ORAL_TABLET | Freq: Two times a day (BID) | ORAL | Status: DC | PRN
  Administered 2022-10-02 – 2022-10-04 (×2): 1 via ORAL
  Filled 2022-09-28 (×3): qty 1

## 2022-09-28 MED ORDER — ONDANSETRON HCL 4 MG/2ML IJ SOLN: 4.0000 mg | Freq: Three times a day (TID) | INTRAMUSCULAR | Status: DC | PRN

## 2022-09-28 MED ORDER — LORAZEPAM 2 MG/ML IJ SOLN: 1.0000 mg | INTRAMUSCULAR | Status: AC | PRN

## 2022-09-28 MED ORDER — ASPIRIN 81 MG PO CHEW
324.0000 mg | CHEWABLE_TABLET | Freq: Once | ORAL | Status: AC
  Administered 2022-09-28: 324 mg via ORAL
  Filled 2022-09-28: qty 4

## 2022-09-28 MED ORDER — HYDRALAZINE HCL 20 MG/ML IJ SOLN: 5.0000 mg | INTRAMUSCULAR | Status: DC | PRN

## 2022-09-28 MED ORDER — FLUTICASONE PROPIONATE 50 MCG/ACT NA SUSP
2.0000 | Freq: Every day | NASAL | Status: DC
  Administered 2022-09-29 – 2022-10-05 (×7): 2 via NASAL
  Filled 2022-09-28: qty 16

## 2022-09-28 MED ORDER — AZITHROMYCIN 500 MG PO TABS
500.0000 mg | ORAL_TABLET | Freq: Every day | ORAL | Status: AC
  Administered 2022-09-28: 500 mg via ORAL
  Filled 2022-09-28: qty 1

## 2022-09-28 MED ORDER — ASPIRIN 81 MG PO CHEW
81.0000 mg | CHEWABLE_TABLET | Freq: Every day | ORAL | Status: DC
  Administered 2022-09-29 – 2022-10-05 (×7): 81 mg via ORAL
  Filled 2022-09-28 (×7): qty 1

## 2022-09-28 MED ORDER — FOLIC ACID 1 MG PO TABS
1.0000 mg | ORAL_TABLET | Freq: Every day | ORAL | Status: DC
  Administered 2022-09-28 – 2022-10-05 (×8): 1 mg via ORAL
  Filled 2022-09-28 (×8): qty 1

## 2022-09-28 MED ORDER — ALBUTEROL SULFATE (2.5 MG/3ML) 0.083% IN NEBU
2.5000 mg | INHALATION_SOLUTION | RESPIRATORY_TRACT | Status: DC | PRN
  Administered 2022-09-30: 2.5 mg via RESPIRATORY_TRACT
  Filled 2022-09-28: qty 3

## 2022-09-28 MED ORDER — IPRATROPIUM-ALBUTEROL 0.5-2.5 (3) MG/3ML IN SOLN
3.0000 mL | Freq: Once | RESPIRATORY_TRACT | Status: AC
  Administered 2022-09-28: 3 mL via RESPIRATORY_TRACT
  Filled 2022-09-28: qty 3

## 2022-09-28 MED ORDER — ENOXAPARIN SODIUM 40 MG/0.4ML IJ SOSY
40.0000 mg | PREFILLED_SYRINGE | INTRAMUSCULAR | Status: DC
  Administered 2022-09-28 – 2022-10-04 (×7): 40 mg via SUBCUTANEOUS
  Filled 2022-09-28 (×7): qty 0.4

## 2022-09-28 MED ORDER — NICOTINE 21 MG/24HR TD PT24: 21.0000 mg | MEDICATED_PATCH | Freq: Once | TRANSDERMAL | Status: DC

## 2022-09-28 MED ORDER — THIAMINE MONONITRATE 100 MG PO TABS
100.0000 mg | ORAL_TABLET | Freq: Every day | ORAL | Status: DC
  Administered 2022-09-28 – 2022-10-05 (×8): 100 mg via ORAL
  Filled 2022-09-28 (×8): qty 1

## 2022-09-28 MED ORDER — IOHEXOL 350 MG/ML SOLN
75.0000 mL | Freq: Once | INTRAVENOUS | Status: AC | PRN
  Administered 2022-09-28: 75 mL via INTRAVENOUS

## 2022-09-28 MED ORDER — IPRATROPIUM-ALBUTEROL 0.5-2.5 (3) MG/3ML IN SOLN
3.0000 mL | RESPIRATORY_TRACT | Status: DC
  Administered 2022-09-28 (×2): 3 mL via RESPIRATORY_TRACT
  Filled 2022-09-28 (×2): qty 3

## 2022-09-28 MED ORDER — FUROSEMIDE 10 MG/ML IJ SOLN
40.0000 mg | Freq: Two times a day (BID) | INTRAMUSCULAR | Status: DC
  Administered 2022-09-28 – 2022-09-30 (×4): 40 mg via INTRAVENOUS
  Filled 2022-09-28 (×4): qty 4

## 2022-09-28 MED ORDER — ATORVASTATIN CALCIUM 20 MG PO TABS
20.0000 mg | ORAL_TABLET | Freq: Every day | ORAL | Status: DC
  Administered 2022-09-29 – 2022-10-05 (×7): 20 mg via ORAL
  Filled 2022-09-28 (×7): qty 1

## 2022-09-28 MED ORDER — THIAMINE HCL 100 MG/ML IJ SOLN
100.0000 mg | Freq: Every day | INTRAMUSCULAR | Status: DC
  Filled 2022-09-28: qty 2

## 2022-09-28 MED ORDER — LORAZEPAM 1 MG PO TABS: 1.0000 mg | ORAL_TABLET | ORAL | Status: AC | PRN

## 2022-09-28 MED ORDER — FUROSEMIDE 10 MG/ML IJ SOLN
40.0000 mg | Freq: Once | INTRAMUSCULAR | Status: AC
  Administered 2022-09-28: 40 mg via INTRAVENOUS
  Filled 2022-09-28: qty 4

## 2022-09-28 MED ORDER — LOSARTAN POTASSIUM 50 MG PO TABS
50.0000 mg | ORAL_TABLET | Freq: Every day | ORAL | Status: DC
  Administered 2022-09-29 – 2022-10-05 (×7): 50 mg via ORAL
  Filled 2022-09-28 (×7): qty 1

## 2022-09-28 MED ORDER — ACETAMINOPHEN 325 MG PO TABS
650.0000 mg | ORAL_TABLET | Freq: Four times a day (QID) | ORAL | Status: DC | PRN
  Filled 2022-09-28: qty 2

## 2022-09-28 MED ORDER — CARVEDILOL 12.5 MG PO TABS
12.5000 mg | ORAL_TABLET | Freq: Two times a day (BID) | ORAL | Status: DC
  Administered 2022-09-28 – 2022-10-05 (×14): 12.5 mg via ORAL
  Filled 2022-09-28 (×9): qty 1
  Filled 2022-09-28: qty 2
  Filled 2022-09-28 (×4): qty 1

## 2022-09-28 MED ORDER — METHYLPREDNISOLONE SODIUM SUCC 40 MG IJ SOLR
40.0000 mg | Freq: Two times a day (BID) | INTRAMUSCULAR | Status: DC
  Administered 2022-09-28 – 2022-10-03 (×10): 40 mg via INTRAVENOUS
  Filled 2022-09-28 (×11): qty 1

## 2022-09-28 MED ORDER — NICOTINE 21 MG/24HR TD PT24
21.0000 mg | MEDICATED_PATCH | Freq: Every day | TRANSDERMAL | Status: DC
  Administered 2022-09-28 – 2022-10-05 (×8): 21 mg via TRANSDERMAL
  Filled 2022-09-28 (×8): qty 1

## 2022-09-28 MED ORDER — AZITHROMYCIN 250 MG PO TABS
250.0000 mg | ORAL_TABLET | Freq: Every day | ORAL | Status: AC
  Administered 2022-09-29 – 2022-10-02 (×4): 250 mg via ORAL
  Filled 2022-09-28 (×4): qty 1

## 2022-09-28 NOTE — ED Notes (Signed)
Date and time results received: 09/28/22 1220  Test: Troponin Critical Value: 232  Name of Provider Notified: Dr. Fanny Bien

## 2022-09-28 NOTE — Progress Notes (Signed)
   Acute Office Visit  Subjective:     Patient ID: Joshua Schmidt, male    DOB: March 18, 1951, 72 y.o.   MRN: [id number]  Chief Complaint  Patient presents with   Shortness of Breath    Shortness of Breath Associated symptoms include leg swelling and wheezing. Pertinent negatives include no chest pain or fever.   Patient is in today for shortness of breath. Patient states he suddenly become short of breath on Thursday and has been having difficulty breathing, with wheezing, weakness, falls since that time. He does have COPD, not on any medication for it by choice. Denies fevers. Chronic cough present. Bilateral leg swelling present which is new as well.   Review of Systems  Constitutional:  Negative for chills and fever.  Respiratory:  Positive for cough, shortness of breath and wheezing.   Cardiovascular:  Positive for leg swelling. Negative for chest pain.  Musculoskeletal:  Positive for falls.  Neurological:  Positive for weakness.        Objective:    BP (!) 142/82   Pulse 67   Temp 98 F (36.7 C)   Resp 18   Ht 6\' 3"  (1.905 m)   Wt 214 lb 4.8 oz (97.2 kg)   SpO2 (!) 81%   BMI 26.79 kg/m  BP Readings from Last 3 Encounters:  09/28/22 (!) 142/82  08/18/22 134/80  06/30/22 135/78   Wt Readings from Last 3 Encounters:  09/28/22 214 lb 4.8 oz (97.2 kg)  08/18/22 202 lb 8 oz (91.9 kg)  06/30/22 203 lb 9.6 oz (92.4 kg)      Physical Exam Constitutional:      Appearance: He is well-developed.  HENT:     Head: Normocephalic and atraumatic.  Eyes:     Conjunctiva/sclera: Conjunctivae normal.  Cardiovascular:     Rate and Rhythm: Normal rate and regular rhythm.  Pulmonary:     Effort: Pulmonary effort is normal.     Comments: Decreased breath sounds throughout but worse in left lower lobe with upper lobe wheezing present Musculoskeletal:     Right lower leg: Edema present.     Left lower leg: Edema present.  Skin:    General: Skin is warm and dry.      Comments: Bloody bandage on arm  Neurological:     General: No focal deficit present.     Mental Status: He is alert. Mental status is at baseline.  Psychiatric:        Mood and Affect: Mood normal.        Behavior: Behavior normal.     No results found for any visits on 09/28/22.      Assessment & Plan:   1. Acute respiratory failure with hypoxia (HCC)/Wheezing: Pulse ox 78-81% with decreased lung sounds, accessory muscle use for respiration and new bilateral lower extremity edema with a history of DVT previously. Extremely concerned for PE/DVT, patient immediately sent to ER for evaluation. Patient refuses EMS transfer but will go to the hospital immediately.    Margarita Mail, DO

## 2022-09-28 NOTE — H&P (Signed)
History and Physical    Joshua Schmidt:[id number] DOB: [birth year] DOA: 09/28/2022  Referring MD/NP/PA:   PCP: Margarita Mail, DO   Patient coming from:  The patient is coming from home.     Chief Complaint: Shortness of breath  HPI: Joshua Schmidt is a 72 y.o. male with medical history significant of dCHF, HTN, HLD,COPD, CAD, tobacco abuse, alcohol abuse, OSA, ascending aortic aneurysm, erythrocytosis, right leg DVT not on anticoagulants, who presents with shortness of breath.  Patient states that he has a short breath for multiple days, which has been progressively worsening.  He has dry cough, no chest pain, fever or chills.  He has about 10 pounds weight gain recently.  Patient does not have nausea, vomiting, diarrhea or abdominal pain.  Denies symptoms of UTI.  Patient was initially seen in the urgent care and found to have oxygen desaturation to 78-81% on room air, use accessory muscle for breathing. Patient is sent to ED for further evaluation and treatment.  Normally patient is not using oxygen.  Now patient needs 4L of oxygen with 94% saturation in ED.  Data reviewed independently and ED Course: pt was found to have troponin 232 --> 224, WBC 8.4, negative PCR for COVID and Flu, GFR> 60, temperature normal, blood pressure 136/88, heart rate 70, RR 31.  Chest x-ray showed cardiomegaly and bilateral interstitial prominence.  CTA negative for PE.  Patient is admitted to PCU as inpatient.  CTA:  1. No evidence of pulmonary embolus. 2. Dilated ascending thoracic aorta, measuring up to 4.5 cm, unchanged when compared with the prior exam. Ascending thoracic aortic aneurysm. Recommend semi-annual imaging followup by CTA or MRA and referral to cardiothoracic surgery if not already obtained. This recommendation follows 2010 ACCF/AHA/AATS/ACR/ASA/SCA/SCAI/SIR/STS/SVM Guidelines for the Diagnosis and Management of Patients With Thoracic Aortic Disease. Circulation. 2010; 121:  W119-J478. Aortic aneurysm NOS (ICD10-I71.9) 3. Stable solid left lower lobe pulmonary nodule. Recommend attention on annual lung cancer screening CT. 4. Dilated main pulmonary artery, findings can be seen in the setting of pulmonary arterial hypertension. 5. Coronary artery calcifications and aortic Atherosclerosis (ICD10-I70.0).   EKG: I have personally reviewed.  Sinus rhythm, QTc 414, LAD, poor R wave progression, RAD, T wave inversion in V1-V5   Review of Systems:   General: no fevers, chills, no body weight gain,  has fatigue HEENT: no blurry vision, hearing changes or sore throat Respiratory: has dyspnea, coughing, wheezing CV: no chest pain, no palpitations GI: no nausea, vomiting, abdominal pain, diarrhea, constipation GU: no dysuria, burning on urination, increased urinary frequency, hematuria  Ext: has leg edema Neuro: no unilateral weakness, numbness, or tingling, no vision change or hearing loss Skin: no rash, no skin tear. MSK: No muscle spasm, no deformity, no limitation of range of movement in spin Heme: No easy bruising.  Travel history: No recent long distant travel.   Allergy: No Known Allergies  Past Medical History:  Diagnosis Date   Aneurysm (HCC)    Erythrocytosis 12/01/2018   Hyperlipidemia    Hypertension    Myocardial infarction [facility])     Past Surgical History:  Procedure Laterality Date   APPENDECTOMY     COLONOSCOPY WITH PROPOFOL N/A 11/20/2014   Procedure: COLONOSCOPY WITH PROPOFOL;  Surgeon: Kieth Brightly, MD;  Location: [facility] ENDOSCOPY;  Service: Endoscopy;  Laterality: N/A;   COLONOSCOPY WITH PROPOFOL N/A 03/19/2022   Procedure: COLONOSCOPY WITH PROPOFOL;  Surgeon: Toney Reil, MD;  Location: [facility] [place] ENDOSCOPY;  Service: Gastroenterology;  Laterality: N/A;  TONSILLECTOMY AND ADENOIDECTOMY      Social History:  reports that he has been smoking cigarettes. He has a 51.00 pack-year smoking history. He has never used smokeless  tobacco. He reports current alcohol use. He reports that he does not use drugs.  Family History: No family history on file.   Prior to Admission medications   Medication Sig Start Date End Date Taking? Authorizing Provider  aspirin 81 MG chewable tablet Chew 81 mg by mouth daily.   Yes [provider]  atorvastatin (LIPITOR) 20 MG tablet TAKE 1 TABLET BY MOUTH DAILY 09/04/22  Yes Margarita Mail, DO  carvedilol (COREG) 12.5 MG tablet Take 1 tablet (12.5 mg total) by mouth 2 (two) times daily. 08/18/22 08/13/23 Yes Iran Ouch, MD  fluticasone (FLONASE) 50 MCG/ACT nasal spray Place 2 sprays into both nostrils daily. 05/21/22  Yes Margarita Mail, DO  hydrochlorothiazide (HYDRODIURIL) 25 MG tablet TAKE 1 TABLET BY MOUTH DAILY 04/14/22  Yes Masoud, Renda Rolls, MD  losartan (COZAAR) 50 MG tablet Take 1 tablet (50 mg total) by mouth daily. 06/30/22  Yes Margarita Mail, DO    Physical Exam: Vitals:   09/28/22 1530 09/28/22 1600 09/28/22 1605 09/28/22 1611  BP: (!) 124/91 125/89  125/89  Pulse: (!) 58 (!) 105  60  Resp: (!) 33 (!) 27    Temp:   97.9 F (36.6 C)   TempSrc:   Oral   SpO2: 94% (!) 86%    Weight:      Height:       General: Not in acute distress HEENT:       Eyes: PERRL, EOMI, no jaundice       ENT: No discharge from the ears and nose, no pharynx injection, no tonsillar enlargement.        Neck: + JVD, no bruit, no mass felt. Heme: No neck lymph node enlargement. Cardiac: S1/S2, RRR, No murmurs, No gallops or rubs. Respiratory: has crackles bilaterally, with mild wheezing bilaterally GI: Soft, nondistended, nontender, no rebound pain, no organomegaly, BS present. GU: No hematuria Ext: 2+ leg edema bilaterally. 1+DP/PT pulse bilaterally. Musculoskeletal: No joint deformities, No joint redness or warmth, no limitation of ROM in spin. Skin: No rashes.  Neuro: Alert, oriented X3, cranial nerves II-XII grossly intact, moves all extremities normally. Psych: Patient  is not psychotic, no suicidal or hemocidal ideation.  Labs on Admission: I have personally reviewed following labs and imaging studies  CBC: Recent Labs  Lab 09/28/22 1130  WBC 8.5  HGB 17.1*  HCT 53.6*  MCV 92.7  PLT 221   Basic Metabolic Panel: Recent Labs  Lab 09/28/22 1130 09/28/22 1359  NA 131*  --   K 4.3  --   CL 90*  --   CO2 33*  --   GLUCOSE 124*  --   BUN 14  --   CREATININE 0.68  --   CALCIUM 8.9  --   MG  --  1.8   GFR: Estimated Creatinine Clearance: 99.8 mL/min (by C-G formula based on SCr of 0.68 mg/dL). Liver Function Tests: No results for input(s): "AST", "ALT", "ALKPHOS", "BILITOT", "PROT", "ALBUMIN" in the last 168 hours. No results for input(s): "LIPASE", "AMYLASE" in the last 168 hours. No results for input(s): "AMMONIA" in the last 168 hours. Coagulation Profile: No results for input(s): "INR", "PROTIME" in the last 168 hours. Cardiac Enzymes: No results for input(s): "CKTOTAL", "CKMB", "CKMBINDEX", "TROPONINI" in the last 168 hours. BNP (last 3 results) No results for input(s): "  PROBNP" in the last 8760 hours. HbA1C: No results for input(s): "HGBA1C" in the last 72 hours. CBG: No results for input(s): "GLUCAP" in the last 168 hours. Lipid Profile: No results for input(s): "CHOL", "HDL", "LDLCALC", "TRIG", "CHOLHDL", "LDLDIRECT" in the last 72 hours. Thyroid Function Tests: No results for input(s): "TSH", "T4TOTAL", "FREET4", "T3FREE", "THYROIDAB" in the last 72 hours. Anemia Panel: No results for input(s): "VITAMINB12", "FOLATE", "FERRITIN", "TIBC", "IRON", "RETICCTPCT" in the last 72 hours. Urine analysis:    Component Value Date/Time   BILIRUBINUR neg 09/06/2020 1012   PROTEINUR Negative 09/06/2020 1012   UROBILINOGEN negative (A) 09/06/2020 1012   NITRITE neg 09/06/2020 1012   LEUKOCYTESUR Negative 09/06/2020 1012   Sepsis Labs: @LABRCNTIP (procalcitonin:4,lacticidven:4) ) Recent Results (from the past 240 hour(s))  Resp Panel  by RT-PCR (Flu A&B, Covid) Anterior Nasal Swab     Status: None   Collection Time: 09/28/22  1:57 PM   Specimen: Anterior Nasal Swab  Result Value Ref Range Status   SARS Coronavirus 2 by RT PCR NEGATIVE NEGATIVE Final    Comment: (NOTE) SARS-CoV-2 target nucleic acids are NOT DETECTED.  The SARS-CoV-2 RNA is generally detectable in upper respiratory specimens during the acute phase of infection. The lowest concentration of SARS-CoV-2 viral copies this assay can detect is 138 copies/mL. A negative result does not preclude SARS-Cov-2 infection and should not be used as the sole basis for treatment or other patient management decisions. A negative result may occur with  improper specimen collection/handling, submission of specimen other than nasopharyngeal swab, presence of viral mutation(s) within the areas targeted by this assay, and inadequate number of viral copies(<138 copies/mL). A negative result must be combined with clinical observations, patient history, and epidemiological information. The expected result is Negative.  Fact Sheet for Patients:  BloggerCourse.com  Fact Sheet for Healthcare Providers:  SeriousBroker.it  This test is no t yet approved or cleared by the [place] FDA and  has been authorized for detection and/or diagnosis of SARS-CoV-2 by FDA under an Emergency Use Authorization (EUA). This EUA will remain  in effect (meaning this test can be used) for the duration of the COVID-19 declaration under Section 564(b)(1) of the Act, 21 U.S.C.section 360bbb-3(b)(1), unless the authorization is terminated  or revoked sooner.       Influenza A by PCR NEGATIVE NEGATIVE Final   Influenza B by PCR NEGATIVE NEGATIVE Final    Comment: (NOTE) The Xpert Xpress SARS-CoV-2/FLU/RSV plus assay is intended as an aid in the diagnosis of influenza from Nasopharyngeal swab specimens and should not be used as a sole basis  for treatment. Nasal washings and aspirates are unacceptable for Xpert Xpress SARS-CoV-2/FLU/RSV testing.  Fact Sheet for Patients: BloggerCourse.com  Fact Sheet for Healthcare Providers: SeriousBroker.it  This test is not yet approved or cleared by the [place] FDA and has been authorized for detection and/or diagnosis of SARS-CoV-2 by FDA under an Emergency Use Authorization (EUA). This EUA will remain in effect (meaning this test can be used) for the duration of the COVID-19 declaration under Section 564(b)(1) of the Act, 21 U.S.C. section 360bbb-3(b)(1), unless the authorization is terminated or revoked.  Performed at [facility], [address] [place], [place] [address]      Radiological Exams on Admission: CT Angio Chest PE W and/or Wo Contrast  Result Date: 09/28/2022 CLINICAL DATA:  Pulmonary embolus suspected EXAM: CT ANGIOGRAPHY CHEST WITH CONTRAST TECHNIQUE: Multidetector CT imaging of the chest was performed using the standard protocol during bolus  administration of intravenous contrast. Multiplanar CT image reconstructions and MIPs were obtained to evaluate the vascular anatomy. RADIATION DOSE REDUCTION: This exam was performed according to the departmental dose-optimization program which includes automated exposure control, adjustment of the mA and/or kV according to patient size and/or use of iterative reconstruction technique. CONTRAST:  75mL OMNIPAQUE IOHEXOL 350 MG/ML SOLN COMPARISON:  Lung cancer screening chest CT dated February 03, 2022 FINDINGS: Cardiovascular: No evidence of pulmonary embolus. Cardiomegaly. No pericardial effusion. Dilated ascending thoracic aorta, measuring up to 4.5 cm, unchanged when compared with the prior. Mild atherosclerotic disease of the thoracic aorta. Severe left main and three-vessel coronary artery calcifications. Dilated main pulmonary artery, measuring up to 4.3 cm.  Mediastinum/Nodes: Esophagus and thyroid are unremarkable. No enlarged lymph nodes seen in the chest. Lungs/Pleura: Central airways are patent. Bibasilar atelectasis. No consolidation, pleural effusion or pneumothorax. Solid pulmonary nodule of the left lower lobe measuring 5 mm series 6, image 129, unchanged when compared with the prior exam. Upper Abdomen: Unchanged bilateral adrenal adenomas, no specific follow-up imaging is necessary. No acute abnormality. Musculoskeletal: No chest wall abnormality. No acute or significant osseous findings. Review of the MIP images confirms the above findings. IMPRESSION: 1. No evidence of pulmonary embolus. 2. Dilated ascending thoracic aorta, measuring up to 4.5 cm, unchanged when compared with the prior exam. Ascending thoracic aortic aneurysm. Recommend semi-annual imaging followup by CTA or MRA and referral to cardiothoracic surgery if not already obtained. This recommendation follows 2010 ACCF/AHA/AATS/ACR/ASA/SCA/SCAI/SIR/STS/SVM Guidelines for the Diagnosis and Management of Patients With Thoracic Aortic Disease. Circulation. 2010; 121: Z610-R604. Aortic aneurysm NOS (ICD10-I71.9) 3. Stable solid left lower lobe pulmonary nodule. Recommend attention on annual lung cancer screening CT. 4. Dilated main pulmonary artery, findings can be seen in the setting of pulmonary arterial hypertension. 5. Coronary artery calcifications and aortic Atherosclerosis (ICD10-I70.0). Electronically Signed   By: Allegra Lai M.D.   On: 09/28/2022 15:00   DG Chest 2 View  Result Date: 09/28/2022 CLINICAL DATA:  SOB EXAM: CHEST - 2 VIEW COMPARISON:  CXR 03/06/05 FINDINGS: No pleural effusion. No pneumothorax. Enlarged cardiac contours. Prominent bilateral interstitial opacities could represent pulmonary venous congestion. Bibasilar atelectasis. No radiographically apparent displaced rib fractures. Visualized upper abdomen is unremarkable. IMPRESSION: Enlarged cardiac contours with  prominent bilateral interstitial opacities, which could represent pulmonary venous congestion. Electronically Signed   By: Lorenza Cambridge M.D.   On: 09/28/2022 13:10      Assessment/Plan Principal Problem:   Acute respiratory failure with hypoxia (HCC) Active Problems:   Acute on chronic diastolic CHF (congestive heart failure) (HCC)   COPD exacerbation (HCC)   Myocardial injury   CAD (coronary artery disease)   Erythrocytosis   HLD (hyperlipidemia)   HTN (hypertension)   Aneurysm of ascending aorta without rupture (HCC)   Lung nodule   DVT (deep venous thrombosis) (HCC)   Tobacco abuse   Alcohol abuse   Assessment and Plan:   Acute respiratory failure with hypoxia [facility]): Patient has 4L of new oxygen requirement now.  This is likely due to combination of COPD and CHF exacerbation.  Patient has wheezing on auscultation, indicating COPD exacerbation.  He has elevated BNP 826, crackles on auscultation, 2+ leg edema, positive JVD, prominent interstitial by chest x-ray, clinically consistent with CHF exacerbation.  -Admitted to PCU as inpatient -Bronchodilators -Z-Pak and Solu-Medrol for COPD exacerbation -IV Lasix for CHF exacerbation -Nasal cannula oxygen to maintain oxygen saturation above 93%  Acute on chronic diastolic CHF (congestive heart failure) (HCC): 2D  echo on 04/15/2022 showed EF of 55-60% with grade 1 diastolic dysfunction.  Now has CHF exacerbation -Lasix 40 mg bid by IV -Daily weights -strict I/O's -Low salt diet -Fluid restriction -As needed bronchodilators for shortness of breath  COPD exacerbation (HCC): -Bronchodilators -Solu-Medrol 40 mg IV bid -Z pak  -Mucinex for cough  -Incentive spirometry -sputum culture -Nasal cannula oxygen as needed to maintain O2 saturation 93% or greater  Myocardial injury and hx of CAD: trop 232 --> 224,  tending down. No CP.  Pulse rate due to demand ischemia. -Aspirin, Lipitor -Trend troponin -Check A1c,  FLP  Erythrocytosis: Hgb stable, 17.1 (17.4 on 06/10/22) -f/u by CBC  HLD (hyperlipidemia) -Lipitor  HTN (hypertension) IV hydralazine as needed -Coreg, Cozaar -Hold HCTZ since patient is on Lasix IV  Aneurysm of ascending aorta without rupture (HCC) -Follow-up with PCP  Lung nodule: CTA showed a stable solid left lower lobe pulmonary nodule.  -f/u with PCP  Hx of DVT (deep venous thrombosis)-right leg: not taking anticoagulants. -Will repeat lower extremity venous Doppler  Tobacco abuse and Alcohol abuse: -Did counseling about importance of quitting smoking and alcohol use -Nicotine patch -CIWA protocol          DVT ppx: SQ Lovenox  Code Status: Full code     Family Communication: yes, patient's wife by phone  Disposition Plan:  Anticipate discharge back to previous environment  Consults called:  none  Admission status and Level of care: Progressive:    as inpt     Dispo: The patient is from: Home              Anticipated d/c is to: Home              Anticipated d/c date is: 2 days              Patient currently is not medically stable to d/c.    Severity of Illness:  The appropriate patient status for this patient is INPATIENT. Inpatient status is judged to be reasonable and necessary in order to provide the required intensity of service to ensure the patient's safety. The patient's presenting symptoms, physical exam findings, and initial radiographic and laboratory data in the context of their chronic comorbidities is felt to place them at high risk for further clinical deterioration. Furthermore, it is not anticipated that the patient will be medically stable for discharge from the hospital within 2 midnights of admission.   * I certify that at the point of admission it is my clinical judgment that the patient will require inpatient hospital care spanning beyond 2 midnights from the point of admission due to high intensity of service, high risk for further  deterioration and high frequency of surveillance required.*       Date of Service 09/28/2022    Lorretta Harp [facility]   If 7PM-7AM, please contact night-coverage [email or web address] 09/28/2022, 5:08 PM

## 2022-09-28 NOTE — ED Triage Notes (Signed)
Pt sts that he has been SOB for the past 4 days. Pt sts that he went to a UC and was directed to come to the ED after he refused EMS at that location.

## 2022-09-28 NOTE — ED Provider Notes (Signed)
[facility] Provider Note    Event Date/Time   First MD Initiated Contact with Patient 09/28/22 1217     (approximate)   History   Shortness of Breath   HPI  Joshua Schmidt is a 72 y.o. male   history of previous DVT, chronic venous insufficiency previous MI  Patient reports for about 4 to 5 days now he has had shortness of breath.  He feels weak when he exerts himself.  He noticed that shortness of breath is been consistent.  He feels like a wet feeling or a wet cough no fever.  He does have a history of COPD but describes this is different than typical wheezing  He is not having any chest pain.  He follows with cardiology including Dr. Kirke Corin        Physical Exam   Triage Vital Signs: ED Triage Vitals  Enc Vitals Group     BP 09/28/22 1129 (!) 154/106     Pulse Rate 09/28/22 1129 70     Resp 09/28/22 1129 17     Temp 09/28/22 1129 98.4 F (36.9 C)     Temp Source 09/28/22 1129 Oral     SpO2 09/28/22 1129 (!) 81 %     Weight 09/28/22 1131 210 lb (95.3 kg)     Height --      Head Circumference --      Peak Flow --      Pain Score 09/28/22 1131 0     Pain Loc --      Pain Edu? --      Excl. in GC? --     Most recent vital signs: Vitals:   09/28/22 1600 09/28/22 1605  BP: 125/89   Pulse: (!) 105   Resp: (!) 27   Temp:  97.9 F (36.6 C)  SpO2: (!) 86%      General: Awake, no distress.  He is very pleasant, currently on oxygen by nasal cannula and reports respirating comfortably.  He does have a frequent somewhat rhonchorous or wet cough CV:  Good peripheral perfusion.  Normal tones and rate Resp:  Normal effort.  His effort is normal but he has crackles throughout the bases bilaterally and questionable slight wheezing with expiration centrally versus rhonchi.  He again does not does exhibit any distress though Abd:  No distention.  Other:  Patient has a moderate pitting edema bilateral lower extremities which she reports is  somewhat worse in the last 4 to 5 days.  Additionally he has JVD approximately to the level of the mandible   ED Results / Procedures / Treatments   Labs (all labs ordered are listed, but only abnormal results are displayed) Labs Reviewed  BASIC METABOLIC PANEL - Abnormal; Notable for the following components:      Result Value   Sodium 131 (*)    Chloride 90 (*)    CO2 33 (*)    Glucose, Bld 124 (*)    All other components within normal limits  CBC - Abnormal; Notable for the following components:   Hemoglobin 17.1 (*)    HCT 53.6 (*)    RDW 16.4 (*)    All other components within normal limits  BRAIN NATRIURETIC PEPTIDE - Abnormal; Notable for the following components:   B Natriuretic Peptide 826.0 (*)    All other components within normal limits  TROPONIN I (HIGH SENSITIVITY) - Abnormal; Notable for the following components:   Troponin I (High Sensitivity) 232 (*)  All other components within normal limits  TROPONIN I (HIGH SENSITIVITY) - Abnormal; Notable for the following components:   Troponin I (High Sensitivity) 224 (*)    All other components within normal limits  RESP PANEL BY RT-PCR (FLU A&B, COVID) [id number]  EXPECTORATED SPUTUM ASSESSMENT W GRAM STAIN, RFLX TO RESP C  HEMOGLOBIN A1C  MAGNESIUM     EKG  And interpreted by me at 1240 Heart rate 65 QRS 100 QTc 400 Sinus rhythm, T wave inversions in a anterior lateral precordial distribution.  Compared with previous EKG from February 6 no significant changes noted.   RADIOLOGY CT chest PE study interpreted by me for acute gross pathology, no acute pulmonary embolism noted.  CT Angio Chest PE W and/or Wo Contrast  Result Date: 09/28/2022 CLINICAL DATA:  Pulmonary embolus suspected EXAM: CT ANGIOGRAPHY CHEST WITH CONTRAST TECHNIQUE: Multidetector CT imaging of the chest was performed using the standard protocol during bolus administration of intravenous contrast. Multiplanar CT image reconstructions and MIPs were  obtained to evaluate the vascular anatomy. RADIATION DOSE REDUCTION: This exam was performed according to the departmental dose-optimization program which includes automated exposure control, adjustment of the mA and/or kV according to patient size and/or use of iterative reconstruction technique. CONTRAST:  75mL OMNIPAQUE IOHEXOL 350 MG/ML SOLN COMPARISON:  Lung cancer screening chest CT dated February 03, 2022 FINDINGS: Cardiovascular: No evidence of pulmonary embolus. Cardiomegaly. No pericardial effusion. Dilated ascending thoracic aorta, measuring up to 4.5 cm, unchanged when compared with the prior. Mild atherosclerotic disease of the thoracic aorta. Severe left main and three-vessel coronary artery calcifications. Dilated main pulmonary artery, measuring up to 4.3 cm. Mediastinum/Nodes: Esophagus and thyroid are unremarkable. No enlarged lymph nodes seen in the chest. Lungs/Pleura: Central airways are patent. Bibasilar atelectasis. No consolidation, pleural effusion or pneumothorax. Solid pulmonary nodule of the left lower lobe measuring 5 mm series 6, image 129, unchanged when compared with the prior exam. Upper Abdomen: Unchanged bilateral adrenal adenomas, no specific follow-up imaging is necessary. No acute abnormality. Musculoskeletal: No chest wall abnormality. No acute or significant osseous findings. Review of the MIP images confirms the above findings. IMPRESSION: 1. No evidence of pulmonary embolus. 2. Dilated ascending thoracic aorta, measuring up to 4.5 cm, unchanged when compared with the prior exam. Ascending thoracic aortic aneurysm. Recommend semi-annual imaging followup by CTA or MRA and referral to cardiothoracic surgery if not already obtained. This recommendation follows 2010 ACCF/AHA/AATS/ACR/ASA/SCA/SCAI/SIR/STS/SVM Guidelines for the Diagnosis and Management of Patients With Thoracic Aortic Disease. Circulation. 2010; 121: Y782-N562. Aortic aneurysm NOS (ICD10-I71.9) 3. Stable solid left  lower lobe pulmonary nodule. Recommend attention on annual lung cancer screening CT. 4. Dilated main pulmonary artery, findings can be seen in the setting of pulmonary arterial hypertension. 5. Coronary artery calcifications and aortic Atherosclerosis (ICD10-I70.0). Electronically Signed   By: Allegra Lai M.D.   On: 09/28/2022 15:00   DG Chest 2 View  Result Date: 09/28/2022 CLINICAL DATA:  SOB EXAM: CHEST - 2 VIEW COMPARISON:  CXR 03/06/05 FINDINGS: No pleural effusion. No pneumothorax. Enlarged cardiac contours. Prominent bilateral interstitial opacities could represent pulmonary venous congestion. Bibasilar atelectasis. No radiographically apparent displaced rib fractures. Visualized upper abdomen is unremarkable. IMPRESSION: Enlarged cardiac contours with prominent bilateral interstitial opacities, which could represent pulmonary venous congestion. Electronically Signed   By: Lorenza Cambridge M.D.   On: 09/28/2022 13:10     PROCEDURES:  Critical Care performed: No  Procedures   IMPRESSION / MDM / ASSESSMENT AND PLAN / ED COURSE  I  reviewed the triage vital signs and the nursing notes.                              Differential diagnosis includes, but is not limited to, mild volume overload based on clinical exam and history, CHF, pulmonary hypertension, ACS, angina, etc.  He has no clear signs or symptoms to be suggestive of pneumonia.  Afebrile and.  No associated chest pain.  Labs notable for normal white count.  Troponin elevated at 220, but with lack of chest pain and no new ischemic changes on EKG I would favor this is likely demand in nature.  BNP is elevated and chest x-ray concerning for vascular congestion.  Patient's presentation is most consistent with acute complicated illness / injury requiring diagnostic workup.   The patient is on the cardiac monitor to evaluate for evidence of arrhythmia and/or significant heart rate changes.   Consulted with and patient accepted to  hospitalist service.  At this point it seems most likely patient suffering from volume overload or pulmonary hypertension.  He has started to diurese well after Lasix.  He is in no acute distress and patient is understanding agreeable with plan for admission to hospitalist service.  Excepted by Dr. Clyde Lundborg     FINAL CLINICAL IMPRESSION(S) / ED DIAGNOSES   Final diagnoses:  Volume overload state of heart  Hypoxia     Rx / DC Orders   ED Discharge Orders     None        Note:  This document was prepared using Dragon voice recognition software and may include unintentional dictation errors.   Sharyn Creamer, MD 09/28/22 [phone]

## 2022-09-28 NOTE — ED Notes (Signed)
Pt reports he has been feeling off since Thursday of last week but wasn't able to be seen by his PCP until this morning. Pt states he got up Thursday morning  and said he hit the ground suddenly, that his legs gave out underneath him. Pt reports that he has been feeling like he's exerted himself (like running a marathon) when he hasn't done anything to feel like this. Pt states he has experienced SOB for years and as long as he takes it easy he can live with it.

## 2022-09-29 ENCOUNTER — Inpatient Hospital Stay: Payer: 59

## 2022-09-29 ENCOUNTER — Encounter: Payer: Self-pay | Admitting: Internal Medicine

## 2022-09-29 DIAGNOSIS — J9601 Acute respiratory failure with hypoxia: Secondary | ICD-10-CM | POA: Diagnosis not present

## 2022-09-29 LAB — HEMOGLOBIN A1C
Hgb A1c MFr Bld: 5.4 % (ref 4.8–5.6)
Mean Plasma Glucose: 108.28 mg/dL

## 2022-09-29 LAB — BASIC METABOLIC PANEL
Anion gap: 9 (ref 5–15)
BUN: 13 mg/dL (ref 8–23)
CO2: 35 mmol/L — ABNORMAL HIGH (ref 22–32)
Calcium: 8.4 mg/dL — ABNORMAL LOW (ref 8.9–10.3)
Chloride: 87 mmol/L — ABNORMAL LOW (ref 98–111)
Creatinine, Ser: 0.64 mg/dL (ref 0.61–1.24)
GFR, Estimated: >60 mL/min (ref >60)
Glucose, Bld: 124 mg/dL — ABNORMAL HIGH (ref 70–99)
Potassium: 3.7 mmol/L (ref 3.5–5.1)
Sodium: 131 mmol/L — ABNORMAL LOW (ref 135–145)

## 2022-09-29 LAB — CBC
HCT: 53.9 % — ABNORMAL HIGH (ref 39.0–52.0)
Hemoglobin: 17 g/dL (ref 13.0–17.0)
MCH: 29.2 pg (ref 26.0–34.0)
MCHC: 31.5 g/dL (ref 30.0–36.0)
MCV: 92.5 fL (ref 80.0–100.0)
Platelets: 199 10*3/uL (ref 150–400)
RBC: 5.83 MIL/uL — ABNORMAL HIGH (ref 4.22–5.81)
RDW: 16.4 % — ABNORMAL HIGH (ref 11.5–15.5)
WBC: 5 10*3/uL (ref 4.0–10.5)
nRBC: 0 % (ref 0.0–0.2)

## 2022-09-29 LAB — LIPID PANEL
Cholesterol: 108 mg/dL (ref 0–200)
HDL: 56 mg/dL (ref >40)
LDL Cholesterol: 41 mg/dL (ref 0–99)
Total CHOL/HDL Ratio: 1.9 RATIO
Triglycerides: 55 mg/dL (ref <150)
VLDL: 11 mg/dL (ref 0–40)

## 2022-09-29 NOTE — Progress Notes (Addendum)
PROGRESS NOTE    Joshua Schmidt   UJW:[id number] DOB: [birth year]  DOA: 09/28/2022 Date of Service: 09/29/22 PCP: Margarita Mail, DO     Brief Narrative / Hospital Course:  Joshua Schmidt is a 72 y.o. male with medical history significant of dCHF, HTN, HLD,COPD, CAD, tobacco abuse, alcohol abuse, OSA, ascending aortic aneurysm, erythrocytosis, right leg DVT not on anticoagulants, who presents with shortness of breath, weight gain. Seen in urgent care, O2 desat, sent to ED>  06/17: Chest x-ray showed cardiomegaly and bilateral interstitial prominence. CTA negative for PE. Patient is admitted to PCU as inpatient. Diuresing. Troponins elevated, likely d/t demand, had Lexiscan Myoview in February which showed no evidence of ischemia with normal ejection fraction. 06/18: SOB improved. Will continue diuresis.     Consultants:  none  Procedures: none      ASSESSMENT & PLAN:   Principal Problem:   Acute respiratory failure with hypoxia (HCC) Active Problems:   Acute on chronic diastolic CHF (congestive heart failure) (HCC)   COPD exacerbation (HCC)   Myocardial injury   CAD (coronary artery disease)   Erythrocytosis   HLD (hyperlipidemia)   HTN (hypertension)   Aneurysm of ascending aorta without rupture (HCC)   Lung nodule   DVT (deep venous thrombosis) (HCC)   Tobacco abuse   Alcohol abuse  Acute respiratory failure with hypoxia [facility]): Patient has 4L of new oxygen requirement now.  This is likely due to combination of COPD and CHF exacerbation.  Patient has wheezing on auscultation, indicating COPD exacerbation.  He has elevated BNP 826, crackles on auscultation, 2+ leg edema, positive JVD, prominent interstitial by chest x-ray, clinically consistent with CHF exacerbation.   -Admitted to PCU as inpatient -Bronchodilators -Z-Pak and Solu-Medrol for COPD exacerbation -IV Lasix for CHF exacerbation -Nasal cannula oxygen to maintain oxygen saturation above 93%    Acute on chronic diastolic CHF (congestive heart failure) (HCC): 2D echo on 04/15/2022 showed EF of 55-60% with grade 1 diastolic dysfunction.  Now has CHF exacerbation -Lasix 40 mg bid by IV -Daily weights -strict I/O's -Low salt diet -Fluid restriction -As needed bronchodilators for shortness of breath   COPD exacerbation (HCC): -Bronchodilators -Solu-Medrol 40 mg IV bid -Z pak  -Mucinex for cough  -Incentive spirometry -sputum culture -Nasal cannula oxygen as needed to maintain O2 saturation 93% or greater   Myocardial injury and hx of CAD: trop 232 --> 224,  tending down. No CP.  Likely due to demand ischemia. -Aspirin, Lipitor -Trend troponin -Check A1c, FLP   Erythrocytosis:  Hgb stable, 17.1 (17.4 on 06/10/22) Likely d/t smoking -f/u by CBC   HLD (hyperlipidemia) -Lipitor   HTN (hypertension) IV hydralazine as needed -Coreg, Cozaar -Hold HCTZ since patient is on Lasix IV   Aneurysm of ascending aorta without rupture (HCC) -Follow-up with PCP   Lung nodule: CTA showed a stable solid left lower lobe pulmonary nodule.  -f/u with PCP   Hx of DVT (deep venous thrombosis)-right leg:  not taking anticoagulants. -Will repeat lower extremity venous Doppler --> negative    Tobacco abuse and Alcohol abuse: -Did counseling about importance of quitting smoking and alcohol use -Nicotine patch -CIWA protocol                   Subjective / Brief ROS:  Patient reports overall better today Denies CP/SOB.  Pain controlled.  Denies new weakness.  Tolerating diet.  Reports no concerns w/ urination/defecation.   Family Communication: none at this time  Objective Findings:  Vitals:   09/29/22 0733 09/29/22 1118 09/29/22 1312 09/29/22 1541  BP:  (!) 145/88  122/80  Pulse:  71  65  Resp:  18  18  Temp:  98.3 F (36.8 C)  98.5 F (36.9 C)  TempSrc:      SpO2: 90% 90% (!) 89% 92%  Weight:      Height:        Intake/Output Summary (Last 24  hours) at 09/29/2022 1639 Last data filed at 09/29/2022 1345 Gross per 24 hour  Intake 840 ml  Output 2700 ml  Net -1860 ml   Filed Weights   09/28/22 1131 09/28/22 1500 09/29/22 0411  Weight: 95.3 kg 95.3 kg 92.9 kg    Examination:  Physical Exam Constitutional:      General: He is not in acute distress. Cardiovascular:     Rate and Rhythm: Normal rate and regular rhythm.  Pulmonary:     Effort: Pulmonary effort is normal.     Breath sounds: Examination of the right-lower field reveals rales. Examination of the left-lower field reveals rales. Decreased breath sounds and rales present.  Musculoskeletal:     Right lower leg: Edema (trace) present.     Left lower leg: Edema (trace) present.  Neurological:     Mental Status: He is alert.          Scheduled Medications:   aspirin  81 mg Oral Daily   atorvastatin  20 mg Oral Daily   azithromycin  250 mg Oral Daily   carvedilol  12.5 mg Oral BID WC   enoxaparin (LOVENOX) injection  40 mg Subcutaneous Q24H   fluticasone  2 spray Each Nare Daily   folic acid  1 mg Oral Daily   furosemide  40 mg Intravenous Q12H   ipratropium-albuterol  3 mL Nebulization Q6H   LORazepam  0-4 mg Intravenous Q6H   Followed by   Melene Muller ON 09/30/2022] LORazepam  0-4 mg Intravenous Q12H   losartan  50 mg Oral Daily   methylPREDNISolone (SOLU-MEDROL) injection  40 mg Intravenous Q12H   multivitamin with minerals  1 tablet Oral Daily   nicotine  21 mg Transdermal Daily   thiamine  100 mg Oral Daily   Or   thiamine  100 mg Intravenous Daily    Continuous Infusions:   PRN Medications:  acetaminophen, albuterol, dextromethorphan-guaiFENesin, hydrALAZINE, LORazepam **OR** LORazepam, ondansetron (ZOFRAN) IV  Antimicrobials from admission:  Anti-infectives (From admission, onward)    Start     Dose/Rate Route Frequency Ordered Stop   09/29/22 1000  azithromycin (ZITHROMAX) tablet 250 mg       See Hyperspace for full Linked Orders Report.    250 mg Oral Daily 09/28/22 1548 10/03/22 0959   09/28/22 1600  azithromycin (ZITHROMAX) tablet 500 mg       See Hyperspace for full Linked Orders Report.   500 mg Oral Daily 09/28/22 1548 09/28/22 1606           Data Reviewed:  I have personally reviewed the following...  CBC: Recent Labs  Lab 09/28/22 1130 09/29/22 0507  WBC 8.5 5.0  HGB 17.1* 17.0  HCT 53.6* 53.9*  MCV 92.7 92.5  PLT 221 199   Basic Metabolic Panel: Recent Labs  Lab 09/28/22 1130 09/28/22 1359 09/29/22 0507  NA 131*  --  131*  K 4.3  --  3.7  CL 90*  --  87*  CO2 33*  --  35*  GLUCOSE 124*  --  124*  BUN 14  --  13  CREATININE 0.68  --  0.64  CALCIUM 8.9  --  8.4*  MG  --  1.8  --    GFR: Estimated Creatinine Clearance: 99.8 mL/min (by C-G formula based on SCr of 0.64 mg/dL). Liver Function Tests: No results for input(s): "AST", "ALT", "ALKPHOS", "BILITOT", "PROT", "ALBUMIN" in the last 168 hours. No results for input(s): "LIPASE", "AMYLASE" in the last 168 hours. No results for input(s): "AMMONIA" in the last 168 hours. Coagulation Profile: No results for input(s): "INR", "PROTIME" in the last 168 hours. Cardiac Enzymes: No results for input(s): "CKTOTAL", "CKMB", "CKMBINDEX", "TROPONINI" in the last 168 hours. BNP (last 3 results) No results for input(s): "PROBNP" in the last 8760 hours. HbA1C: Recent Labs    09/28/22 1724  HGBA1C 5.4   CBG: No results for input(s): "GLUCAP" in the last 168 hours. Lipid Profile: Recent Labs    09/29/22 0507  CHOL 108  HDL 56  LDLCALC 41  TRIG 55  CHOLHDL 1.9   Thyroid Function Tests: No results for input(s): "TSH", "T4TOTAL", "FREET4", "T3FREE", "THYROIDAB" in the last 72 hours. Anemia Panel: No results for input(s): "VITAMINB12", "FOLATE", "FERRITIN", "TIBC", "IRON", "RETICCTPCT" in the last 72 hours. Most Recent Urinalysis On File:     Component Value Date/Time   BILIRUBINUR neg 09/06/2020 1012   PROTEINUR Negative 09/06/2020 1012    UROBILINOGEN negative (A) 09/06/2020 1012   NITRITE neg 09/06/2020 1012   LEUKOCYTESUR Negative 09/06/2020 1012   Sepsis Labs: @LABRCNTIP (procalcitonin:4,lacticidven:4) Microbiology: Recent Results (from the past 240 hour(s))  Resp Panel by RT-PCR (Flu A&B, Covid) Anterior Nasal Swab     Status: None   Collection Time: 09/28/22  1:57 PM   Specimen: Anterior Nasal Swab  Result Value Ref Range Status   SARS Coronavirus 2 by RT PCR NEGATIVE NEGATIVE Final    Comment: (NOTE) SARS-CoV-2 target nucleic acids are NOT DETECTED.  The SARS-CoV-2 RNA is generally detectable in upper respiratory specimens during the acute phase of infection. The lowest concentration of SARS-CoV-2 viral copies this assay can detect is 138 copies/mL. A negative result does not preclude SARS-Cov-2 infection and should not be used as the sole basis for treatment or other patient management decisions. A negative result may occur with  improper specimen collection/handling, submission of specimen other than nasopharyngeal swab, presence of viral mutation(s) within the areas targeted by this assay, and inadequate number of viral copies(<138 copies/mL). A negative result must be combined with clinical observations, patient history, and epidemiological information. The expected result is Negative.  Fact Sheet for Patients:  BloggerCourse.com  Fact Sheet for Healthcare Providers:  SeriousBroker.it  This test is no t yet approved or cleared by the [place] FDA and  has been authorized for detection and/or diagnosis of SARS-CoV-2 by FDA under an Emergency Use Authorization (EUA). This EUA will remain  in effect (meaning this test can be used) for the duration of the COVID-19 declaration under Section 564(b)(1) of the Act, 21 U.S.C.section 360bbb-3(b)(1), unless the authorization is terminated  or revoked sooner.       Influenza A by PCR NEGATIVE NEGATIVE  Final   Influenza B by PCR NEGATIVE NEGATIVE Final    Comment: (NOTE) The Xpert Xpress SARS-CoV-2/FLU/RSV plus assay is intended as an aid in the diagnosis of influenza from Nasopharyngeal swab specimens and should not be used as a sole basis for treatment. Nasal washings and aspirates are unacceptable for Xpert Xpress SARS-CoV-2/FLU/RSV testing.  Fact Sheet for  Patients: BloggerCourse.com  Fact Sheet for Healthcare Providers: SeriousBroker.it  This test is not yet approved or cleared by the [place] FDA and has been authorized for detection and/or diagnosis of SARS-CoV-2 by FDA under an Emergency Use Authorization (EUA). This EUA will remain in effect (meaning this test can be used) for the duration of the COVID-19 declaration under Section 564(b)(1) of the Act, 21 U.S.C. section 360bbb-3(b)(1), unless the authorization is terminated or revoked.  Performed at [facility], [address], [place], [place] [address]       Radiology Studies last 3 days: US Venous Img Lower Bilateral (DVT)  Result Date: 09/29/2022 CLINICAL DATA:  Lower extremity edema EXAM: BILATERAL LOWER EXTREMITY VENOUS DOPPLER ULTRASOUND TECHNIQUE: Gray-scale sonography with graded compression, as well as color Doppler and duplex ultrasound were performed to evaluate the lower extremity deep venous systems from the level of the common femoral vein and including the common femoral, femoral, profunda femoral, popliteal and calf veins including the posterior tibial, peroneal and gastrocnemius veins when visible. The superficial great saphenous vein was also interrogated. Spectral Doppler was utilized to evaluate flow at rest and with distal augmentation maneuvers in the common femoral, femoral and popliteal veins. COMPARISON:  None Available. FINDINGS: RIGHT LOWER EXTREMITY Common Femoral Vein: No evidence of thrombus. Normal compressibility, respiratory  phasicity and response to augmentation. Saphenofemoral Junction: No evidence of thrombus. Normal compressibility and flow on color Doppler imaging. Profunda Femoral Vein: No evidence of thrombus. Normal compressibility and flow on color Doppler imaging. Femoral Vein: No evidence of thrombus. Normal compressibility, respiratory phasicity and response to augmentation. Popliteal Vein: No evidence of thrombus. Normal compressibility, respiratory phasicity and response to augmentation. Calf Veins: No evidence of thrombus. Normal compressibility and flow on color Doppler imaging. Superficial Great Saphenous Vein: No evidence of thrombus. Normal compressibility. Venous Reflux:  None. Other Findings:  None. LEFT LOWER EXTREMITY Common Femoral Vein: No evidence of thrombus. Normal compressibility, respiratory phasicity and response to augmentation. Saphenofemoral Junction: No evidence of thrombus. Normal compressibility and flow on color Doppler imaging. Profunda Femoral Vein: No evidence of thrombus. Normal compressibility and flow on color Doppler imaging. Femoral Vein: No evidence of thrombus. Normal compressibility, respiratory phasicity and response to augmentation. Popliteal Vein: No evidence of thrombus. Normal compressibility, respiratory phasicity and response to augmentation. Calf Veins: No evidence of thrombus. Normal compressibility and flow on color Doppler imaging. Superficial Great Saphenous Vein: No evidence of thrombus. Normal compressibility. Venous Reflux:  None. Other Findings:  None. IMPRESSION: No evidence of deep venous thrombosis in either lower extremity. Electronically Signed   By: Malachy Moan M.D.   On: 09/29/2022 11:05   CT Angio Chest PE W and/or Wo Contrast  Result Date: 09/28/2022 CLINICAL DATA:  Pulmonary embolus suspected EXAM: CT ANGIOGRAPHY CHEST WITH CONTRAST TECHNIQUE: Multidetector CT imaging of the chest was performed using the standard protocol during bolus administration of  intravenous contrast. Multiplanar CT image reconstructions and MIPs were obtained to evaluate the vascular anatomy. RADIATION DOSE REDUCTION: This exam was performed according to the departmental dose-optimization program which includes automated exposure control, adjustment of the mA and/or kV according to patient size and/or use of iterative reconstruction technique. CONTRAST:  75mL OMNIPAQUE IOHEXOL 350 MG/ML SOLN COMPARISON:  Lung cancer screening chest CT dated February 03, 2022 FINDINGS: Cardiovascular: No evidence of pulmonary embolus. Cardiomegaly. No pericardial effusion. Dilated ascending thoracic aorta, measuring up to 4.5 cm, unchanged when compared with the prior. Mild atherosclerotic disease of the thoracic aorta. Severe left  main and three-vessel coronary artery calcifications. Dilated main pulmonary artery, measuring up to 4.3 cm. Mediastinum/Nodes: Esophagus and thyroid are unremarkable. No enlarged lymph nodes seen in the chest. Lungs/Pleura: Central airways are patent. Bibasilar atelectasis. No consolidation, pleural effusion or pneumothorax. Solid pulmonary nodule of the left lower lobe measuring 5 mm series 6, image 129, unchanged when compared with the prior exam. Upper Abdomen: Unchanged bilateral adrenal adenomas, no specific follow-up imaging is necessary. No acute abnormality. Musculoskeletal: No chest wall abnormality. No acute or significant osseous findings. Review of the MIP images confirms the above findings. IMPRESSION: 1. No evidence of pulmonary embolus. 2. Dilated ascending thoracic aorta, measuring up to 4.5 cm, unchanged when compared with the prior exam. Ascending thoracic aortic aneurysm. Recommend semi-annual imaging followup by CTA or MRA and referral to cardiothoracic surgery if not already obtained. This recommendation follows 2010 ACCF/AHA/AATS/ACR/ASA/SCA/SCAI/SIR/STS/SVM Guidelines for the Diagnosis and Management of Patients With Thoracic Aortic Disease. Circulation.  2010; 121: W098-J191. Aortic aneurysm NOS (ICD10-I71.9) 3. Stable solid left lower lobe pulmonary nodule. Recommend attention on annual lung cancer screening CT. 4. Dilated main pulmonary artery, findings can be seen in the setting of pulmonary arterial hypertension. 5. Coronary artery calcifications and aortic Atherosclerosis (ICD10-I70.0). Electronically Signed   By: Allegra Lai M.D.   On: 09/28/2022 15:00   DG Chest 2 View  Result Date: 09/28/2022 CLINICAL DATA:  SOB EXAM: CHEST - 2 VIEW COMPARISON:  CXR 03/06/05 FINDINGS: No pleural effusion. No pneumothorax. Enlarged cardiac contours. Prominent bilateral interstitial opacities could represent pulmonary venous congestion. Bibasilar atelectasis. No radiographically apparent displaced rib fractures. Visualized upper abdomen is unremarkable. IMPRESSION: Enlarged cardiac contours with prominent bilateral interstitial opacities, which could represent pulmonary venous congestion. Electronically Signed   By: Lorenza Cambridge M.D.   On: 09/28/2022 13:10             LOS: 1 day      Sunnie Nielsen, DO [facility] 09/29/2022, 4:39 PM    Dictation software may have been used to generate the above note. Typos may occur and escape review in typed/dictated notes. Please contact Dr Lyn Hollingshead directly for clarity if needed.  Staff may message me via secure chat in Epic  but this may not receive an immediate response,  please page me for urgent matters!  If 7PM-7AM, please contact night coverage [email or web address]

## 2022-09-29 NOTE — TOC Initial Note (Signed)
Transition of Care [facility]) - Initial/Assessment Note    Patient Details  Name: Joshua Schmidt MRN: [id number] Date of Birth: 09-10-1950  [facility]) CM/SW Contact:    Margarito Liner, LCSW Phone Number: 09/29/2022, 12:07 PM  Clinical Narrative:   CSW met with patient. No supports at bedside. CSW introduced role and explained that discharge planning would be discussed. PCP is Margarita Mail, DO. Patient drives himself to appointments. He uses [facility] but if any new medications are needed at discharge, [facility] [address] No issues obtaining medications. Patient lives home with his wife. No home health or DME use prior to admission. Patient is currently on acute oxygen. Will follow for this potential need. Patient declined SA resources. No further concerns. CSW encouraged patient to contact CSW as needed. CSW will continue to follow patient for support and facilitate return home once stable. He will drive himself home at discharge.               Expected Discharge Plan: Home/Self Care Barriers to Discharge: Continued Medical Work up   Patient Goals and CMS Choice            Expected Discharge Plan and Services     Post Acute Care Choice: NA Living arrangements for the past 2 months: Single Family Home                                      Prior Living Arrangements/Services Living arrangements for the past 2 months: Single Family Home Lives with:: Spouse Patient language and need for interpreter reviewed:: Yes Do you feel safe going back to the place where you live?: Yes      Need for Family Participation in Patient Care: Yes (Comment) Care giver support system in place?: Yes (comment)   Criminal Activity/Legal Involvement Pertinent to Current Situation/Hospitalization: No - Comment as needed  Activities of Daily Living Home Assistive Devices/Equipment: None ADL Screening (condition at time of admission) Patient's cognitive  ability adequate to safely complete daily activities?: No Is the patient deaf or have difficulty hearing?: No Does the patient have difficulty seeing, even when wearing glasses/contacts?: No Does the patient have difficulty concentrating, remembering, or making decisions?: No Patient able to express need for assistance with ADLs?: Yes Does the patient have difficulty dressing or bathing?: No Independently performs ADLs?: Yes (appropriate for developmental age) Does the patient have difficulty walking or climbing stairs?: No Weakness of Legs: Both Weakness of Arms/Hands: None  Permission Sought/Granted                  Emotional Assessment Appearance:: Appears stated age Attitude/Demeanor/Rapport: Engaged, Gracious Affect (typically observed): Accepting, Appropriate, Calm, Pleasant Orientation: : Oriented to Self, Oriented to Place, Oriented to  Time, Oriented to Situation Alcohol / Substance Use: Alcohol Use Psych Involvement: No (comment)  Admission diagnosis:  Hypoxia [R09.02] Acute respiratory failure with hypoxia (HCC) [J96.01] Acute on chronic diastolic CHF (congestive heart failure) (HCC) [I50.33] Volume overload state of heart [E87.79] Patient Active Problem List   Diagnosis Date Noted   Acute on chronic diastolic CHF (congestive heart failure) (HCC) 09/28/2022   Lung nodule 09/28/2022   Myocardial injury 09/28/2022   Acute respiratory failure with hypoxia (HCC) 09/28/2022   HLD (hyperlipidemia) 09/28/2022   HTN (hypertension) 09/28/2022   COPD exacerbation (HCC) 09/28/2022   CAD (coronary artery disease) 09/28/2022  DVT (deep venous thrombosis) (HCC) 09/28/2022   Sleep apnea 06/10/2022   History of adenomatous polyp of colon 03/19/2022   Adenomatous polyp of descending colon 03/19/2022   Traumatic tear of left rotator cuff 02/09/2022   Aneurysm of ascending aorta without rupture (HCC) 02/09/2022   Iron deficiency 10/22/2021   Varicose veins with pain  05/04/2021   Chronic venous insufficiency 05/04/2021   Acute deep vein thrombosis (DVT) of right lower extremity (HCC) 12/03/2020   Encounter for annual physical exam 09/06/2020   Alcohol abuse 09/06/2020   Bruit of left carotid artery 08/30/2020   Erythrocytosis 12/01/2018   Tobacco abuse 10/27/2018   Sebaceous cyst 11/08/2015   PCP:  Margarita Mail, DO [facility]acy:   [id number] [place] [facility], [place] - [address] [place] [address] Phone: [phone] Fax: [phone]  [facility]) - [place], [place] - [address] [facility] [address] [place] [address] [place] [address] Phone: [phone] Fax: [phone]  [facility] Delivery - [place], [place] - [address] [place] [address] Phone: [phone] Fax: [phone]     Social Determinants of Health (SDOH) Social History: SDOH Screenings   Food Insecurity: No Food Insecurity (09/28/2022)  Housing: Low Risk  (09/28/2022)  Transportation Needs: No Transportation Needs (09/28/2022)  Utilities: Not At Risk (09/28/2022)  Depression (PHQ2-9): Low Risk  (09/28/2022)  Financial Resource Strain: Low Risk  (11/06/2021)  Physical Activity: Sufficiently Active (11/06/2021)  Social Connections: Unknown (11/06/2021)  Stress: No Stress Concern Present (11/06/2021)  Tobacco Use: High Risk (09/29/2022)   SDOH Interventions:     Readmission Risk Interventions     No data to display

## 2022-09-30 DIAGNOSIS — J9601 Acute respiratory failure with hypoxia: Secondary | ICD-10-CM | POA: Diagnosis not present

## 2022-09-30 LAB — BASIC METABOLIC PANEL
Anion gap: 9 (ref 5–15)
BUN: 19 mg/dL (ref 8–23)
CO2: 37 mmol/L — ABNORMAL HIGH (ref 22–32)
Calcium: 8.2 mg/dL — ABNORMAL LOW (ref 8.9–10.3)
Chloride: 90 mmol/L — ABNORMAL LOW (ref 98–111)
Creatinine, Ser: 0.69 mg/dL (ref 0.61–1.24)
GFR, Estimated: >60 mL/min (ref >60)
Glucose, Bld: 123 mg/dL — ABNORMAL HIGH (ref 70–99)
Potassium: 3.5 mmol/L (ref 3.5–5.1)
Sodium: 136 mmol/L (ref 135–145)

## 2022-09-30 LAB — EXPECTORATED SPUTUM ASSESSMENT W GRAM STAIN, RFLX TO RESP C

## 2022-09-30 MED ORDER — ORAL CARE MOUTH RINSE: 15.0000 mL | OROMUCOSAL | Status: DC | PRN

## 2022-09-30 MED ORDER — FUROSEMIDE 10 MG/ML IJ SOLN
40.0000 mg | Freq: Three times a day (TID) | INTRAMUSCULAR | Status: DC
  Administered 2022-09-30 – 2022-10-02 (×6): 40 mg via INTRAVENOUS
  Filled 2022-09-30 (×6): qty 4

## 2022-09-30 NOTE — Progress Notes (Addendum)
PROGRESS NOTE    ORRIN SIMMONDS   ZOX:[id number] DOB: 13-Nov-1950  DOA: 09/28/2022 Date of Service: 09/30/22 PCP: Margarita Mail, DO     Brief Narrative / Hospital Course:  TRENDEN SUPPES is a 72 y.o. male with medical history significant of dCHF, HTN, HLD,COPD, CAD, tobacco abuse, alcohol abuse, OSA, ascending aortic aneurysm, erythrocytosis, right leg DVT not on anticoagulants, who presents with shortness of breath, weight gain. Seen in urgent care, O2 desat, sent to ED>  06/17: Chest x-ray showed cardiomegaly and bilateral interstitial prominence. CTA negative for PE. Patient is admitted to PCU as inpatient. Diuresing. Troponins elevated, likely d/t demand, had Lexiscan Myoview in February which showed no evidence of ischemia with normal ejection fraction. 06/18: SOB improved. Will continue diuresis lasix 40 mg IV bid.  06/19: not much progress weaning O2. Room Air at Rest =73%. Continue diuresis, increased lasix to tid. Continue IV steroids. Will get repeat Echo to reasses cardiac function.      Consultants:  none  Procedures: none      ASSESSMENT & PLAN:   Principal Problem:   Acute respiratory failure with hypoxia (HCC) Active Problems:   Acute on chronic diastolic CHF (congestive heart failure) (HCC)   COPD exacerbation (HCC)   Myocardial injury   CAD (coronary artery disease)   Erythrocytosis   HLD (hyperlipidemia)   HTN (hypertension)   Aneurysm of ascending aorta without rupture (HCC)   Lung nodule   DVT (deep venous thrombosis) (HCC)   Tobacco abuse   Alcohol abuse  Acute respiratory failure with hypoxia [facility]):  Patient has 4L of new oxygen requirement  likely due to combination of COPD and CHF exacerbation.   O2 support Treat underlying causes as below     Acute on chronic diastolic CHF (congestive heart failure) (HCC):  2D echo on 04/15/2022 showed EF of 55-60% with grade 1 diastolic dysfunction.  Now has CHF exacerbation Lasix 40 mg bid by IV  --> tid today  Repeat echo to reassess given worsening and substantial O2 need Cardiology consult pending echo and pending response to increased diuresis  Daily weights, strict I/O's, Low salt diet, Fluid restriction   COPD exacerbation (HCC): Bronchodilators Solu-Medrol 40 mg IV bid Z pak  Mucinex for cough  Incentive spirometry sputum culture Nasal cannula oxygen as needed to maintain O2 saturation 90% or greater   Myocardial injury and hx of CAD:  trop 232 --> 224,  tending down. No CP.  Likely due to demand ischemia. Aspirin, Lipitor Trend troponin Check A1c, FLP Echo   Erythrocytosis:  Hgb stable, 17.1 (17.4 on 06/10/22) Likely d/t smoking f/u by CBC   HLD (hyperlipidemia) Lipitor   HTN (hypertension) IV hydralazine as needed Coreg, Cozaar Hold HCTZ since patient is on Lasix IV   Aneurysm of ascending aorta without rupture (HCC) Follow-up with PCP   Lung nodule: CTA showed a stable solid left lower lobe pulmonary nodule.  f/u with PCP   Hx of DVT (deep venous thrombosis)-right leg:  not taking anticoagulants. Will repeat lower extremity venous Doppler --> negative    Tobacco abuse and Alcohol abuse: Did counseling about importance of quitting smoking and alcohol use Nicotine patch CIWA protocol   DVT prophylaxis: lovenox  Pertinent IV fluids/nutrition: no continuous IV fluids Central lines / invasive devices: none  Code Status: FULL CODE ACP documentation reviewed: none on file   Current Admission Status: inpatient   TOC needs / Dispo plan: TBD< may need home O2 Barriers to discharge /  significant pending items: Echo, O2 requirement                     Subjective / Brief ROS:  Patient reports depressed he isn't getting better Denies CP SOB on exertion  Pain controlled.  Denies new weakness.  Tolerating diet.  Reports no concerns w/ urination/defecation.   Family Communication: none at this time    Objective  Findings:  Vitals:   09/30/22 0353 09/30/22 0728 09/30/22 0729 09/30/22 1056  BP: 122/64 111/69  118/72  Pulse: 72 68  66  Resp: 18 18  18   Temp: 98 F (36.7 C) 97.8 F (36.6 C)  97.7 F (36.5 C)  TempSrc: Oral     SpO2: 96% 96% 94% 94%  Weight:    89.9 kg  Height:        Intake/Output Summary (Last 24 hours) at 09/30/2022 1535 Last data filed at 09/30/2022 1101 Gross per 24 hour  Intake 600 ml  Output 2175 ml  Net -1575 ml   Filed Weights   09/28/22 1500 09/29/22 0411 09/30/22 1056  Weight: 95.3 kg 92.9 kg 89.9 kg    Examination:  Physical Exam Constitutional:      General: He is not in acute distress. Cardiovascular:     Rate and Rhythm: Normal rate and regular rhythm.  Pulmonary:     Effort: Pulmonary effort is normal.     Breath sounds: Examination of the right-lower field reveals rales. Examination of the left-lower field reveals rales. Decreased breath sounds and rales present.  Musculoskeletal:     Right lower leg: No edema (trace).     Left lower leg: No edema (trace).  Neurological:     Mental Status: He is alert.          Scheduled Medications:   aspirin  81 mg Oral Daily   atorvastatin  20 mg Oral Daily   azithromycin  250 mg Oral Daily   carvedilol  12.5 mg Oral BID WC   enoxaparin (LOVENOX) injection  40 mg Subcutaneous Q24H   fluticasone  2 spray Each Nare Daily   folic acid  1 mg Oral Daily   furosemide  40 mg Intravenous Q8H   LORazepam  0-4 mg Intravenous Q6H   Followed by   LORazepam  0-4 mg Intravenous Q12H   losartan  50 mg Oral Daily   methylPREDNISolone (SOLU-MEDROL) injection  40 mg Intravenous Q12H   multivitamin with minerals  1 tablet Oral Daily   nicotine  21 mg Transdermal Daily   thiamine  100 mg Oral Daily   Or   thiamine  100 mg Intravenous Daily    Continuous Infusions:   PRN Medications:  acetaminophen, albuterol, dextromethorphan-guaiFENesin, hydrALAZINE, LORazepam **OR** LORazepam, ondansetron (ZOFRAN)  IV  Antimicrobials from admission:  Anti-infectives (From admission, onward)    Start     Dose/Rate Route Frequency Ordered Stop   09/29/22 1000  azithromycin (ZITHROMAX) tablet 250 mg       See Hyperspace for full Linked Orders Report.   250 mg Oral Daily 09/28/22 1548 10/03/22 0959   09/28/22 1600  azithromycin (ZITHROMAX) tablet 500 mg       See Hyperspace for full Linked Orders Report.   500 mg Oral Daily 09/28/22 1548 09/28/22 1606           Data Reviewed:  I have personally reviewed the following...  CBC: Recent Labs  Lab 09/28/22 1130 09/29/22 0507  WBC 8.5 5.0  HGB 17.1* 17.0  HCT 53.6* 53.9*  MCV 92.7 92.5  PLT 221 199   Basic Metabolic Panel: Recent Labs  Lab 09/28/22 1130 09/28/22 1359 09/29/22 0507 09/30/22 0346  NA 131*  --  131* 136  K 4.3  --  3.7 3.5  CL 90*  --  87* 90*  CO2 33*  --  35* 37*  GLUCOSE 124*  --  124* 123*  BUN 14  --  13 19  CREATININE 0.68  --  0.64 0.69  CALCIUM 8.9  --  8.4* 8.2*  MG  --  1.8  --   --    GFR: Estimated Creatinine Clearance: 99.8 mL/min (by C-G formula based on SCr of 0.69 mg/dL). Liver Function Tests: No results for input(s): "AST", "ALT", "ALKPHOS", "BILITOT", "PROT", "ALBUMIN" in the last 168 hours. No results for input(s): "LIPASE", "AMYLASE" in the last 168 hours. No results for input(s): "AMMONIA" in the last 168 hours. Coagulation Profile: No results for input(s): "INR", "PROTIME" in the last 168 hours. Cardiac Enzymes: No results for input(s): "CKTOTAL", "CKMB", "CKMBINDEX", "TROPONINI" in the last 168 hours. BNP (last 3 results) No results for input(s): "PROBNP" in the last 8760 hours. HbA1C: Recent Labs    09/28/22 1724  HGBA1C 5.4   CBG: No results for input(s): "GLUCAP" in the last 168 hours. Lipid Profile: Recent Labs    09/29/22 0507  CHOL 108  HDL 56  LDLCALC 41  TRIG 55  CHOLHDL 1.9   Thyroid Function Tests: No results for input(s): "TSH", "T4TOTAL", "FREET4", "T3FREE",  "THYROIDAB" in the last 72 hours. Anemia Panel: No results for input(s): "VITAMINB12", "FOLATE", "FERRITIN", "TIBC", "IRON", "RETICCTPCT" in the last 72 hours. Most Recent Urinalysis On File:     Component Value Date/Time   BILIRUBINUR neg 09/06/2020 1012   PROTEINUR Negative 09/06/2020 1012   UROBILINOGEN negative (A) 09/06/2020 1012   NITRITE neg 09/06/2020 1012   LEUKOCYTESUR Negative 09/06/2020 1012   Sepsis Labs: @LABRCNTIP (procalcitonin:4,lacticidven:4) Microbiology: Recent Results (from the past 240 hour(s))  Resp Panel by RT-PCR (Flu A&B, Covid) Anterior Nasal Swab     Status: None   Collection Time: 09/28/22  1:57 PM   Specimen: Anterior Nasal Swab  Result Value Ref Range Status   SARS Coronavirus 2 by RT PCR NEGATIVE NEGATIVE Final    Comment: (NOTE) SARS-CoV-2 target nucleic acids are NOT DETECTED.  The SARS-CoV-2 RNA is generally detectable in upper respiratory specimens during the acute phase of infection. The lowest concentration of SARS-CoV-2 viral copies this assay can detect is 138 copies/mL. A negative result does not preclude SARS-Cov-2 infection and should not be used as the sole basis for treatment or other patient management decisions. A negative result may occur with  improper specimen collection/handling, submission of specimen other than nasopharyngeal swab, presence of viral mutation(s) within the areas targeted by this assay, and inadequate number of viral copies(<138 copies/mL). A negative result must be combined with clinical observations, patient history, and epidemiological information. The expected result is Negative.  Fact Sheet for Patients:  BloggerCourse.com  Fact Sheet for Healthcare Providers:  SeriousBroker.it  This test is no t yet approved or cleared by the [place] FDA and  has been authorized for detection and/or diagnosis of SARS-CoV-2 by FDA under an Emergency Use  Authorization (EUA). This EUA will remain  in effect (meaning this test can be used) for the duration of the COVID-19 declaration under Section 564(b)(1) of the Act, 21 U.S.C.section 360bbb-3(b)(1), unless the authorization is terminated  or revoked sooner.  Influenza A by PCR NEGATIVE NEGATIVE Final   Influenza B by PCR NEGATIVE NEGATIVE Final    Comment: (NOTE) The Xpert Xpress SARS-CoV-2/FLU/RSV plus assay is intended as an aid in the diagnosis of influenza from Nasopharyngeal swab specimens and should not be used as a sole basis for treatment. Nasal washings and aspirates are unacceptable for Xpert Xpress SARS-CoV-2/FLU/RSV testing.  Fact Sheet for Patients: BloggerCourse.com  Fact Sheet for Healthcare Providers: SeriousBroker.it  This test is not yet approved or cleared by the [place] FDA and has been authorized for detection and/or diagnosis of SARS-CoV-2 by FDA under an Emergency Use Authorization (EUA). This EUA will remain in effect (meaning this test can be used) for the duration of the COVID-19 declaration under Section 564(b)(1) of the Act, 21 U.S.C. section 360bbb-3(b)(1), unless the authorization is terminated or revoked.  Performed at [facility], [address] [place], [place] [address]       Radiology Studies last 3 days: US Venous Img Lower Bilateral (DVT)  Result Date: 09/29/2022 CLINICAL DATA:  Lower extremity edema EXAM: BILATERAL LOWER EXTREMITY VENOUS DOPPLER ULTRASOUND TECHNIQUE: Gray-scale sonography with graded compression, as well as color Doppler and duplex ultrasound were performed to evaluate the lower extremity deep venous systems from the level of the common femoral vein and including the common femoral, femoral, profunda femoral, popliteal and calf veins including the posterior tibial, peroneal and gastrocnemius veins when visible. The superficial great saphenous vein was  also interrogated. Spectral Doppler was utilized to evaluate flow at rest and with distal augmentation maneuvers in the common femoral, femoral and popliteal veins. COMPARISON:  None Available. FINDINGS: RIGHT LOWER EXTREMITY Common Femoral Vein: No evidence of thrombus. Normal compressibility, respiratory phasicity and response to augmentation. Saphenofemoral Junction: No evidence of thrombus. Normal compressibility and flow on color Doppler imaging. Profunda Femoral Vein: No evidence of thrombus. Normal compressibility and flow on color Doppler imaging. Femoral Vein: No evidence of thrombus. Normal compressibility, respiratory phasicity and response to augmentation. Popliteal Vein: No evidence of thrombus. Normal compressibility, respiratory phasicity and response to augmentation. Calf Veins: No evidence of thrombus. Normal compressibility and flow on color Doppler imaging. Superficial Great Saphenous Vein: No evidence of thrombus. Normal compressibility. Venous Reflux:  None. Other Findings:  None. LEFT LOWER EXTREMITY Common Femoral Vein: No evidence of thrombus. Normal compressibility, respiratory phasicity and response to augmentation. Saphenofemoral Junction: No evidence of thrombus. Normal compressibility and flow on color Doppler imaging. Profunda Femoral Vein: No evidence of thrombus. Normal compressibility and flow on color Doppler imaging. Femoral Vein: No evidence of thrombus. Normal compressibility, respiratory phasicity and response to augmentation. Popliteal Vein: No evidence of thrombus. Normal compressibility, respiratory phasicity and response to augmentation. Calf Veins: No evidence of thrombus. Normal compressibility and flow on color Doppler imaging. Superficial Great Saphenous Vein: No evidence of thrombus. Normal compressibility. Venous Reflux:  None. Other Findings:  None. IMPRESSION: No evidence of deep venous thrombosis in either lower extremity. Electronically Signed   By: Malachy Moan M.D.   On: 09/29/2022 11:05   CT Angio Chest PE W and/or Wo Contrast  Result Date: 09/28/2022 CLINICAL DATA:  Pulmonary embolus suspected EXAM: CT ANGIOGRAPHY CHEST WITH CONTRAST TECHNIQUE: Multidetector CT imaging of the chest was performed using the standard protocol during bolus administration of intravenous contrast. Multiplanar CT image reconstructions and MIPs were obtained to evaluate the vascular anatomy. RADIATION DOSE REDUCTION: This exam was performed according to the departmental dose-optimization program which includes automated exposure control, adjustment of the  mA and/or kV according to patient size and/or use of iterative reconstruction technique. CONTRAST:  75mL OMNIPAQUE IOHEXOL 350 MG/ML SOLN COMPARISON:  Lung cancer screening chest CT dated February 03, 2022 FINDINGS: Cardiovascular: No evidence of pulmonary embolus. Cardiomegaly. No pericardial effusion. Dilated ascending thoracic aorta, measuring up to 4.5 cm, unchanged when compared with the prior. Mild atherosclerotic disease of the thoracic aorta. Severe left main and three-vessel coronary artery calcifications. Dilated main pulmonary artery, measuring up to 4.3 cm. Mediastinum/Nodes: Esophagus and thyroid are unremarkable. No enlarged lymph nodes seen in the chest. Lungs/Pleura: Central airways are patent. Bibasilar atelectasis. No consolidation, pleural effusion or pneumothorax. Solid pulmonary nodule of the left lower lobe measuring 5 mm series 6, image 129, unchanged when compared with the prior exam. Upper Abdomen: Unchanged bilateral adrenal adenomas, no specific follow-up imaging is necessary. No acute abnormality. Musculoskeletal: No chest wall abnormality. No acute or significant osseous findings. Review of the MIP images confirms the above findings. IMPRESSION: 1. No evidence of pulmonary embolus. 2. Dilated ascending thoracic aorta, measuring up to 4.5 cm, unchanged when compared with the prior exam. Ascending  thoracic aortic aneurysm. Recommend semi-annual imaging followup by CTA or MRA and referral to cardiothoracic surgery if not already obtained. This recommendation follows 2010 ACCF/AHA/AATS/ACR/ASA/SCA/SCAI/SIR/STS/SVM Guidelines for the Diagnosis and Management of Patients With Thoracic Aortic Disease. Circulation. 2010; 121: J191-Y782. Aortic aneurysm NOS (ICD10-I71.9) 3. Stable solid left lower lobe pulmonary nodule. Recommend attention on annual lung cancer screening CT. 4. Dilated main pulmonary artery, findings can be seen in the setting of pulmonary arterial hypertension. 5. Coronary artery calcifications and aortic Atherosclerosis (ICD10-I70.0). Electronically Signed   By: Allegra Lai M.D.   On: 09/28/2022 15:00   DG Chest 2 View  Result Date: 09/28/2022 CLINICAL DATA:  SOB EXAM: CHEST - 2 VIEW COMPARISON:  CXR 03/06/05 FINDINGS: No pleural effusion. No pneumothorax. Enlarged cardiac contours. Prominent bilateral interstitial opacities could represent pulmonary venous congestion. Bibasilar atelectasis. No radiographically apparent displaced rib fractures. Visualized upper abdomen is unremarkable. IMPRESSION: Enlarged cardiac contours with prominent bilateral interstitial opacities, which could represent pulmonary venous congestion. Electronically Signed   By: Lorenza Cambridge M.D.   On: 09/28/2022 13:10             LOS: 2 days      Sunnie Nielsen, DO [facility] 09/30/2022, 3:35 PM    Dictation software may have been used to generate the above note. Typos may occur and escape review in typed/dictated notes. Please contact Dr Lyn Hollingshead directly for clarity if needed.  Staff may message me via secure chat in Epic  but this may not receive an immediate response,  please page me for urgent matters!  If 7PM-7AM, please contact night coverage [email or web address]

## 2022-09-30 NOTE — Consult Note (Signed)
[facility] Customer service manager [facility]) Accountable Care Organization (ACO) [facility] Liaison Note  09/30/2022  Joshua Schmidt June 05, 1950 [id number]  Location: [facility] RN Hospital Liaison met patient at bedside at [facility].  Insurance: [employer]   Joshua Schmidt is a 72 y.o. male who is a Primary Care Patient of Margarita Mail, DO. The patient was screened for  readmission hospitalization with noted low risk score for unplanned readmission risk with 1 iP in 6 months.  The patient was assessed for potential [facility]) Care Management service needs for post hospital transition for care coordination. Review of patient's electronic medical record reveals patient with volume state of heart (hypoxia). Liaison met with pt at bedside an introduced services. Pt receptive to a post prevention readmission call upon his discharge. Pt inquired about providing an additional supplemental insurance to the hospital. At this hour office are closed -liaison requested pt to call the main hospital contact number which was provided during business hours request billing or financial administration to provider the additional information. Pt very grateful for the information. No further request at this time. No anticipated needs presented at this time.   Plan: [facility] Liaison will continue to follow progress and disposition to asess for post hospital community care coordination/management needs.  Referral request for community care coordination: anticipate [facility] Transitions of Care Team follow up.   [facility] Care Management/Population Health does not replace or interfere with any arrangements made by the Inpatient Transition of Care team.   For questions contact:   Elliot Cousin, RN, BSN [facility] Liaison [place]   [facility]  Population Health Office Hours MTWF  8:00 am-6:00 pm Off on Thursday [phone]  mobile [phone] [Office toll free line][facility] Office Hours are M-F 8:30 - 5 pm 24 hour nurse advise line [phone] Concierge  [email or web address]

## 2022-09-30 NOTE — Progress Notes (Signed)
SATURATION QUALIFICATIONS: (This note is used to comply with regulatory documentation for home oxygen)  Patient Saturations on Room Air at Rest = 73 %  Patient Saturations on Room Air while Ambulating = 70 %  Patient Saturations on 8 Liters of oxygen while Ambulating = 91%  Please briefly explain why patient needs home oxygen:

## 2022-10-01 ENCOUNTER — Inpatient Hospital Stay (HOSPITAL_COMMUNITY)
Admit: 2022-10-01 | Discharge: 2022-10-01 | Disposition: A | Discharge status: Home / Self Care | Payer: 59 | Attending: Osteopathic Medicine | Admitting: Osteopathic Medicine

## 2022-10-01 ENCOUNTER — Encounter: Payer: Self-pay | Admitting: Oncology

## 2022-10-01 DIAGNOSIS — I5033 Acute on chronic diastolic (congestive) heart failure: Secondary | ICD-10-CM

## 2022-10-01 DIAGNOSIS — J9601 Acute respiratory failure with hypoxia: Secondary | ICD-10-CM | POA: Diagnosis not present

## 2022-10-01 LAB — ECHOCARDIOGRAM COMPLETE
AR max vel: 3.52 cm2
AV Area VTI: 4.11 cm2
AV Area mean vel: 3.38 cm2
AV Mean grad: 4 mmHg
AV Peak grad: 8.8 mmHg
Ao pk vel: 1.48 m/s
Area-P 1/2: 2.19 cm2
Height: 75 in
MV VTI: 3.85 cm2
S' Lateral: 2.4 cm
Weight: 3185.21 oz

## 2022-10-01 LAB — BASIC METABOLIC PANEL
Anion gap: 11 (ref 5–15)
BUN: 21 mg/dL (ref 8–23)
CO2: 41 mmol/L — ABNORMAL HIGH (ref 22–32)
Calcium: 8.4 mg/dL — ABNORMAL LOW (ref 8.9–10.3)
Chloride: 85 mmol/L — ABNORMAL LOW (ref 98–111)
Creatinine, Ser: 0.83 mg/dL (ref 0.61–1.24)
GFR, Estimated: >60 mL/min (ref >60)
Glucose, Bld: 123 mg/dL — ABNORMAL HIGH (ref 70–99)
Potassium: 3.9 mmol/L (ref 3.5–5.1)
Sodium: 137 mmol/L (ref 135–145)

## 2022-10-01 NOTE — TOC Progression Note (Signed)
Transition of Care [facility]) - Progression Note    Patient Details  Name: Joshua Schmidt MRN: [id number] Date of Birth: 1950/10/07  Transition of [facility]) CM/SW Contact  Margarito Liner, LCSW Phone Number: 10/01/2022, 1:36 PM  Clinical Narrative:   TOC continues to follow for discharge needs. Patient is still on acute oxygen.  Expected Discharge Plan: Home/Self Care Barriers to Discharge: Continued Medical Work up  Expected Discharge Plan and Services     Post Acute Care Choice: NA Living arrangements for the past 2 months: Single Family Home                                       Social Determinants of Health (SDOH) Interventions SDOH Screenings   Food Insecurity: No Food Insecurity (09/28/2022)  Housing: Low Risk  (09/28/2022)  Transportation Needs: No Transportation Needs (09/28/2022)  Utilities: Not At Risk (09/28/2022)  Depression (PHQ2-9): Low Risk  (09/28/2022)  Financial Resource Strain: Low Risk  (11/06/2021)  Physical Activity: Sufficiently Active (11/06/2021)  Social Connections: Unknown (11/06/2021)  Stress: No Stress Concern Present (11/06/2021)  Tobacco Use: High Risk (09/29/2022)    Readmission Risk Interventions     No data to display

## 2022-10-01 NOTE — Care Management Important Message (Signed)
Important Message  Patient Details  Name: Joshua Schmidt MRN: [id number] Date of Birth: December 27, 1950   Medicare Important Message Given:  Yes     [name] 10/01/2022, 2:06 PM

## 2022-10-01 NOTE — Progress Notes (Addendum)
PROGRESS NOTE  Joshua Schmidt UJW:[id number] DOB: 05/26/1950 DOA: 09/28/2022 PCP: Margarita Mail, DO  HPI/Recap of past 24 hours: Joshua Schmidt is a 72 y.o. male with medical history significant of dCHF, HTN, HLD,COPD, CAD, tobacco abuse, currently 1 pack/day from 2 packs/day, alcohol abuse, OSA, ascending aortic aneurysm, erythrocytosis, right leg DVT not on anticoagulants, who presents with shortness of breath, weight gain. Seen in urgent care, O2 desat, sent to ED>  06/17: Chest x-ray showed cardiomegaly and bilateral interstitial prominence. CTA negative for PE. Patient is admitted to PCU as inpatient. Diuresing. Troponins elevated, likely d/t demand, had Lexiscan Myoview in February which showed no evidence of ischemia with normal ejection fraction. 06/18: SOB improved. Will continue diuresis lasix 40 mg IV bid.  06/19: not much progress weaning O2. Room Air at Rest =73%. Continue diuresis, increased lasix to tid. Continue IV steroids. Will get repeat Echo to reasses cardiac function.  10/01/2022: The patient was seen and examined at his bedside.  Still dyspneic with exertion.  Not on oxygen supplementation at baseline, currently requiring 5 L to maintain a saturation above 92%.    Assessment/Plan: Principal Problem:   Acute respiratory failure with hypoxia (HCC) Active Problems:   Acute on chronic diastolic CHF (congestive heart failure) (HCC)   COPD exacerbation (HCC)   Myocardial injury   CAD (coronary artery disease)   Erythrocytosis   HLD (hyperlipidemia)   HTN (hypertension)   Aneurysm of ascending aorta without rupture (HCC)   Lung nodule   DVT (deep venous thrombosis) (HCC)   Tobacco abuse   Alcohol abuse  Acute respiratory failure with hypoxia (HCC) likely secondary to COPD exacerbation versus acute on chronic diastolic CHF:  Continue to treat underlying causes Continue IV Solu-Medrol, bronchodilators, breathing treatments, IV azithromycin. On IV Lasix 40 mg 3  times daily, continue. Incentive spirometer Early mobilization   Acute on chronic diastolic CHF (congestive heart failure) (HCC):  BNP greater than 800 2D echo on 04/15/2022 showed EF of 55-60% with grade 1 diastolic dysfunction.  Now has CHF exacerbation Continue Lasix 40 mg 3 times daily Repeat 2D echo, limited on 10/01/2022. Continue strict I's and O's and daily weight.   Acute COPD exacerbation (HCC): Bronchodilators Solu-Medrol 40 mg IV bid Z pak  Mucinex for cough  Incentive spirometry sputum culture Nasal cannula oxygen as needed to maintain O2 saturation 90% or greater   Myocardial injury and hx of CAD:  trop 232 --> 224,  tending down. No CP.  Likely due to demand ischemia. Aspirin, Lipitor Trend troponin Check A1c, FLP Follow 2D echo   Erythrocytosis:  Hgb stable, 17.1 (17.4 on 06/10/22) Likely d/t smoking   HLD (hyperlipidemia) Lipitor   HTN (hypertension) IV hydralazine as needed Coreg, Cozaar Continue to monitor vital signs with   Aneurysm of ascending aorta without rupture (HCC) Follow-up with PCP   Lung nodule: CTA showed a stable solid left lower lobe pulmonary nodule.  f/u with PCP   Hx of DVT (deep venous thrombosis)-right leg:  not taking anticoagulants. Will repeat lower extremity venous Doppler --> negative  CT angio chest negative for pulmonary embolism.   Tobacco abuse and Alcohol abuse: Smoking cessation counseling provided. Nicotine patch  Alcohol abuse with concern for withdrawal CIWA protocol Multivitamin, thiamine, folic acid supplement     DVT prophylaxis: lovenox subcu daily  Central lines / invasive devices: none   Code Status: FULL CODE ACP documentation reviewed: none on file    Current Admission Status: inpatient  TOC needs / Dispo plan: TBD< may need home O2 Barriers to discharge / significant pending items: Echo, O2 requirement          Status is: Inpatient     Objective: Vitals:   09/30/22 2315 10/01/22  0438 10/01/22 0500 10/01/22 0858  BP: 117/80 110/67  119/78  Pulse: 65 67  69  Resp: 18 18  18   Temp: 98.3 F (36.8 C) 97.9 F (36.6 C)  98.2 F (36.8 C)  TempSrc:  Oral  Oral  SpO2: 90% 94%  92%  Weight:   90.3 kg   Height:        Intake/Output Summary (Last 24 hours) at 10/01/2022 1550 Last data filed at 10/01/2022 1409 Gross per 24 hour  Intake 460 ml  Output 2600 ml  Net -2140 ml   Filed Weights   09/29/22 0411 09/30/22 1056 10/01/22 0500  Weight: 92.9 kg 89.9 kg 90.3 kg    Exam:  General: 72 y.o. year-old male well developed well nourished in no acute distress.  Alert and oriented x3. Cardiovascular: Regular rate and rhythm with no rubs or gallops.  No thyromegaly or JVD noted.   Respiratory: Mild rales at bases.  Mild wheezing noted.  Poor inspiratory effort. Abdomen: Soft nontender nondistended with normal bowel sounds x4 quadrants. Musculoskeletal: No lower extremity edema. 2/4 pulses in all 4 extremities. Skin: No ulcerative lesions noted or rashes, Psychiatry: Mood is appropriate for condition and setting   Data Reviewed: CBC: Recent Labs  Lab 09/28/22 1130 09/29/22 0507  WBC 8.5 5.0  HGB 17.1* 17.0  HCT 53.6* 53.9*  MCV 92.7 92.5  PLT 221 199   Basic Metabolic Panel: Recent Labs  Lab 09/28/22 1130 09/28/22 1359 09/29/22 0507 09/30/22 0346 10/01/22 0436  NA 131*  --  131* 136 137  K 4.3  --  3.7 3.5 3.9  CL 90*  --  87* 90* 85*  CO2 33*  --  35* 37* 41*  GLUCOSE 124*  --  124* 123* 123*  BUN 14  --  13 19 21   CREATININE 0.68  --  0.64 0.69 0.83  CALCIUM 8.9  --  8.4* 8.2* 8.4*  MG  --  1.8  --   --   --    GFR: Estimated Creatinine Clearance: 96.2 mL/min (by C-G formula based on SCr of 0.83 mg/dL). Liver Function Tests: No results for input(s): "AST", "ALT", "ALKPHOS", "BILITOT", "PROT", "ALBUMIN" in the last 168 hours. No results for input(s): "LIPASE", "AMYLASE" in the last 168 hours. No results for input(s): "AMMONIA" in the last 168  hours. Coagulation Profile: No results for input(s): "INR", "PROTIME" in the last 168 hours. Cardiac Enzymes: No results for input(s): "CKTOTAL", "CKMB", "CKMBINDEX", "TROPONINI" in the last 168 hours. BNP (last 3 results) No results for input(s): "PROBNP" in the last 8760 hours. HbA1C: Recent Labs    09/28/22 1724  HGBA1C 5.4   CBG: No results for input(s): "GLUCAP" in the last 168 hours. Lipid Profile: Recent Labs    09/29/22 0507  CHOL 108  HDL 56  LDLCALC 41  TRIG 55  CHOLHDL 1.9   Thyroid Function Tests: No results for input(s): "TSH", "T4TOTAL", "FREET4", "T3FREE", "THYROIDAB" in the last 72 hours. Anemia Panel: No results for input(s): "VITAMINB12", "FOLATE", "FERRITIN", "TIBC", "IRON", "RETICCTPCT" in the last 72 hours. Urine analysis:    Component Value Date/Time   BILIRUBINUR neg 09/06/2020 1012   PROTEINUR Negative 09/06/2020 1012   UROBILINOGEN negative (A) 09/06/2020 1012  NITRITE neg 09/06/2020 1012   LEUKOCYTESUR Negative 09/06/2020 1012   Sepsis Labs: @LABRCNTIP (procalcitonin:4,lacticidven:4)  ) Recent Results (from the past 240 hour(s))  Resp Panel by RT-PCR (Flu A&B, Covid) Anterior Nasal Swab     Status: None   Collection Time: 09/28/22  1:57 PM   Specimen: Anterior Nasal Swab  Result Value Ref Range Status   SARS Coronavirus 2 by RT PCR NEGATIVE NEGATIVE Final    Comment: (NOTE) SARS-CoV-2 target nucleic acids are NOT DETECTED.  The SARS-CoV-2 RNA is generally detectable in upper respiratory specimens during the acute phase of infection. The lowest concentration of SARS-CoV-2 viral copies this assay can detect is 138 copies/mL. A negative result does not preclude SARS-Cov-2 infection and should not be used as the sole basis for treatment or other patient management decisions. A negative result may occur with  improper specimen collection/handling, submission of specimen other than nasopharyngeal swab, presence of viral mutation(s) within  the areas targeted by this assay, and inadequate number of viral copies(<138 copies/mL). A negative result must be combined with clinical observations, patient history, and epidemiological information. The expected result is Negative.  Fact Sheet for Patients:  BloggerCourse.com  Fact Sheet for Healthcare Providers:  SeriousBroker.it  This test is no t yet approved or cleared by the [place] FDA and  has been authorized for detection and/or diagnosis of SARS-CoV-2 by FDA under an Emergency Use Authorization (EUA). This EUA will remain  in effect (meaning this test can be used) for the duration of the COVID-19 declaration under Section 564(b)(1) of the Act, 21 U.S.C.section 360bbb-3(b)(1), unless the authorization is terminated  or revoked sooner.       Influenza A by PCR NEGATIVE NEGATIVE Final   Influenza B by PCR NEGATIVE NEGATIVE Final    Comment: (NOTE) The Xpert Xpress SARS-CoV-2/FLU/RSV plus assay is intended as an aid in the diagnosis of influenza from Nasopharyngeal swab specimens and should not be used as a sole basis for treatment. Nasal washings and aspirates are unacceptable for Xpert Xpress SARS-CoV-2/FLU/RSV testing.  Fact Sheet for Patients: BloggerCourse.com  Fact Sheet for Healthcare Providers: SeriousBroker.it  This test is not yet approved or cleared by the [place] FDA and has been authorized for detection and/or diagnosis of SARS-CoV-2 by FDA under an Emergency Use Authorization (EUA). This EUA will remain in effect (meaning this test can be used) for the duration of the COVID-19 declaration under Section 564(b)(1) of the Act, 21 U.S.C. section 360bbb-3(b)(1), unless the authorization is terminated or revoked.  Performed at [facility], [address] [place], [place] [address]   Expectorated Sputum Assessment w Gram Stain, Rflx  to Resp Cult     Status: None   Collection Time: 09/30/22  5:50 PM   Specimen: SPU  Result Value Ref Range Status   Specimen Description SPUTUM  Final   Special Requests EXPSU  Final   Sputum evaluation   Final    Sputum specimen not acceptable for testing.  Please recollect.   CALLED TO [facility] HARBAWI 09/30/22 1833 KLW Performed at [facility], [address] [place], [place] [address]    Report Status 09/30/2022 FINAL  Final      Studies: No results found.  Scheduled Meds:  aspirin  81 mg Oral Daily   atorvastatin  20 mg Oral Daily   azithromycin  250 mg Oral Daily   carvedilol  12.5 mg Oral BID WC   enoxaparin (LOVENOX) injection  40 mg Subcutaneous Q24H  fluticasone  2 spray Each Nare Daily   folic acid  1 mg Oral Daily   furosemide  40 mg Intravenous Q8H   LORazepam  0-4 mg Intravenous Q12H   losartan  50 mg Oral Daily   methylPREDNISolone (SOLU-MEDROL) injection  40 mg Intravenous Q12H   multivitamin with minerals  1 tablet Oral Daily   nicotine  21 mg Transdermal Daily   thiamine  100 mg Oral Daily   Or   thiamine  100 mg Intravenous Daily    Continuous Infusions:   LOS: 3 days     Darlin Drop, MD [facility] Pager [phone]  If 7PM-7AM, please contact night-coverage [email or web address] Password [email or web address] 10/01/2022, 3:50 PM

## 2022-10-01 NOTE — Plan of Care (Signed)
  Problem: Education: Goal: Ability to demonstrate management of disease process will improve Outcome: Progressing Goal: Ability to verbalize understanding of medication therapies will improve Outcome: Progressing Goal: Individualized Educational Video(s) Outcome: Progressing   Problem: Activity: Goal: Capacity to carry out activities will improve Outcome: Progressing   Problem: Cardiac: Goal: Ability to achieve and maintain adequate cardiopulmonary perfusion will improve Outcome: Progressing   Problem: Education: Goal: Knowledge of disease or condition will improve Outcome: Progressing Goal: Knowledge of the prescribed therapeutic regimen will improve Outcome: Progressing Goal: Individualized Educational Video(s) Outcome: Progressing   Problem: Activity: Goal: Ability to tolerate increased activity will improve Outcome: Progressing Goal: Will verbalize the importance of balancing activity with adequate rest periods Outcome: Progressing   Problem: Respiratory: Goal: Ability to maintain a clear airway will improve Outcome: Progressing Goal: Levels of oxygenation will improve Outcome: Progressing Goal: Ability to maintain adequate ventilation will improve Outcome: Progressing   Problem: Education: Goal: Knowledge of General Education information will improve Description Including pain rating scale, medication(s)/side effects and non-pharmacologic comfort measures Outcome: Progressing   Problem: Health Behavior/Discharge Planning: Goal: Ability to manage health-related needs will improve Outcome: Progressing   Problem: Clinical Measurements: Goal: Ability to maintain clinical measurements within normal limits will improve Outcome: Progressing Goal: Will remain free from infection Outcome: Progressing Goal: Diagnostic test results will improve Outcome: Progressing Goal: Respiratory complications will improve Outcome: Progressing Goal: Cardiovascular complication  will be avoided Outcome: Progressing   Problem: Activity: Goal: Risk for activity intolerance will decrease Outcome: Progressing   Problem: Nutrition: Goal: Adequate nutrition will be maintained Outcome: Progressing   Problem: Coping: Goal: Level of anxiety will decrease Outcome: Progressing   Problem: Elimination: Goal: Will not experience complications related to bowel motility Outcome: Progressing Goal: Will not experience complications related to urinary retention Outcome: Progressing   Problem: Pain Managment: Goal: General experience of comfort will improve Outcome: Progressing   Problem: Safety: Goal: Ability to remain free from injury will improve Outcome: Progressing   Problem: Skin Integrity: Goal: Risk for impaired skin integrity will decrease Outcome: Progressing   

## 2022-10-02 ENCOUNTER — Inpatient Hospital Stay (HOSPITAL_COMMUNITY)
Admit: 2022-10-02 | Discharge: 2022-10-02 | Disposition: A | Discharge status: Home / Self Care | Payer: 59 | Attending: Internal Medicine | Admitting: Internal Medicine

## 2022-10-02 DIAGNOSIS — J9601 Acute respiratory failure with hypoxia: Secondary | ICD-10-CM | POA: Diagnosis not present

## 2022-10-02 DIAGNOSIS — R0609 Other forms of dyspnea: Secondary | ICD-10-CM

## 2022-10-02 LAB — BLOOD GAS, ARTERIAL
Acid-Base Excess: 24.9 mmol/L — ABNORMAL HIGH (ref 0.0–2.0)
Bicarbonate: 54.2 mmol/L — ABNORMAL HIGH (ref 20.0–28.0)
O2 Saturation: 95.2 %
Patient temperature: 37
pCO2 arterial: 78 mmHg (ref 32–48)
pH, Arterial: 7.45 (ref 7.35–7.45)
pO2, Arterial: 69 mmHg — ABNORMAL LOW (ref 83–108)

## 2022-10-02 LAB — ECHOCARDIOGRAM LIMITED
Height: 75 in
S' Lateral: 3.4 cm
Weight: 3185.21 oz

## 2022-10-02 LAB — CBC
HCT: 56.1 % — ABNORMAL HIGH (ref 39.0–52.0)
Hemoglobin: 17.3 g/dL — ABNORMAL HIGH (ref 13.0–17.0)
MCH: 28.7 pg (ref 26.0–34.0)
MCHC: 30.8 g/dL (ref 30.0–36.0)
MCV: 93.2 fL (ref 80.0–100.0)
Platelets: 215 10*3/uL (ref 150–400)
RBC: 6.02 MIL/uL — ABNORMAL HIGH (ref 4.22–5.81)
RDW: 16.3 % — ABNORMAL HIGH (ref 11.5–15.5)
WBC: 11.5 10*3/uL — ABNORMAL HIGH (ref 4.0–10.5)
nRBC: 0 % (ref 0.0–0.2)

## 2022-10-02 LAB — PROCALCITONIN: Procalcitonin: <0.1 ng/mL

## 2022-10-02 LAB — BRAIN NATRIURETIC PEPTIDE: B Natriuretic Peptide: 75.6 pg/mL (ref 0.0–100.0)

## 2022-10-02 MED ORDER — BUDESONIDE 0.5 MG/2ML IN SUSP
0.5000 mg | Freq: Two times a day (BID) | RESPIRATORY_TRACT | Status: DC
  Administered 2022-10-02 – 2022-10-05 (×7): 0.5 mg via RESPIRATORY_TRACT
  Filled 2022-10-02 (×7): qty 2

## 2022-10-02 MED ORDER — REVEFENACIN 175 MCG/3ML IN SOLN
175.0000 ug | Freq: Every day | RESPIRATORY_TRACT | Status: DC
  Administered 2022-10-02 – 2022-10-05 (×4): 175 ug via RESPIRATORY_TRACT
  Filled 2022-10-02 (×4): qty 3

## 2022-10-02 MED ORDER — ARFORMOTEROL TARTRATE 15 MCG/2ML IN NEBU
15.0000 ug | INHALATION_SOLUTION | Freq: Two times a day (BID) | RESPIRATORY_TRACT | Status: DC
  Administered 2022-10-02 – 2022-10-05 (×7): 15 ug via RESPIRATORY_TRACT
  Filled 2022-10-02 (×8): qty 2

## 2022-10-02 NOTE — Evaluation (Signed)
Occupational Therapy Evaluation Patient Details Name: Joshua Schmidt MRN: [id number] DOB: 05-24-1950 Today's Date: 10/02/2022   History of Present Illness Pt is a 72 y.o. male presenting to hospital 6/17 with c/o SOB.  Pt admitted with acute on chronic diastolic CHF, acute respiratory failure with hypoxia, COPD exacerbation, MI injury, and erythrocytosis  PMH includes COPD, h/o DVT, chronic venous insufficinecy, h/o MI, CHF, htn, CAD, OSA, AAA.   Clinical Impression   Pt was seen for OT evaluation this date. Prior to hospital admission, pt was active and independent. Pt lives with his spouse who is also active and independent. Pt presents to acute OT demonstrating near baseline independence with ADL, just limited by need for 5L O2 to maintain SpO2 >90%. Pt educated in home/routines modifications, and energy conservation strategies including activity pacing and pursed lip breathing. Pt also educated in benefits of obtaining a pulse oximeter to keep on hand at home. Pt verbalized understanding. Denied additional OT needs. No additional skilled OT needs at this time. Will sign off. Pt in agreement.    Recommendations for follow up therapy are one component of a multi-disciplinary discharge planning process, led by the attending physician.  Recommendations may be updated based on patient status, additional functional criteria and insurance authorization.   Assistance Recommended at Discharge PRN  Patient can return home with the following      Functional Status Assessment  Patient has not had a recent decline in their functional status  Equipment Recommendations  None recommended by OT    Recommendations for Other Services       Precautions / Restrictions Precautions Precautions: None Restrictions Weight Bearing Restrictions: No      Mobility Bed Mobility Overal bed mobility: Modified Independent                  Transfers Overall transfer level: Independent Equipment used:  None                      Balance Overall balance assessment: Independent                                         ADL either performed or assessed with clinical judgement   ADL Overall ADL's : Independent                                             Vision         Perception     Praxis      Pertinent Vitals/Pain Pain Assessment Pain Assessment: No/denies pain     Hand Dominance     Extremity/Trunk Assessment Upper Extremity Assessment Upper Extremity Assessment: Overall WFL for tasks assessed   Lower Extremity Assessment Lower Extremity Assessment: Generalized weakness   Cervical / Trunk Assessment Cervical / Trunk Assessment: Normal   Communication Communication Communication: No difficulties   Cognition Arousal/Alertness: Awake/alert Behavior During Therapy: WFL for tasks assessed/performed Overall Cognitive Status: Within Functional Limits for tasks assessed                                       General Comments  blood (appearing old to semi-old) noted on pt's bed sheets when  pt stood up--unsure of where it came from--nurse notified    Exercises Other Exercises Other Exercises: Pt educated in home/routines modifications, and energy conservation strategies including activity pacing and pursed lip breathing   Shoulder Instructions      Home Living Family/patient expects to be discharged to:: Private residence Living Arrangements: Spouse/significant other Available Help at Discharge: Family;Available 24 hours/day Type of Home: House Home Access: Stairs to enter [employer] of Steps: 3 platform steps Entrance Stairs-Rails: Right;Left Home Layout: One level     Bathroom Shower/Tub: Chief Strategy Officer: Handicapped height     Home Equipment: Other (comment);Grab bars - tub/shower (walking stick)          Prior Functioning/Environment Prior Level of Function :  Independent/Modified Independent             Mobility Comments: Pt is a retired Product/process development scientist.  Occasional use of walking stick when going for walks.  H/o 3 recent falls (various activities). ADLs Comments: Independent        OT Problem List: Cardiopulmonary status limiting activity      OT Treatment/Interventions:      OT Goals(Current goals can be found in the care plan section) Acute Rehab OT Goals Patient Stated Goal: be independent  OT Frequency:      Co-evaluation              AM-PAC OT "6 Clicks" Daily Activity     Outcome Measure Help from another person eating meals?: None Help from another person taking care of personal grooming?: None Help from another person toileting, which includes using toliet, bedpan, or urinal?: None Help from another person bathing (including washing, rinsing, drying)?: None Help from another person to put on and taking off regular upper body clothing?: None Help from another person to put on and taking off regular lower body clothing?: None 6 Click Score: 24   End of Session    Activity Tolerance: Patient tolerated treatment well Patient left: in bed;with call bell/phone within reach (EOB for lunch)  OT Visit Diagnosis: Other abnormalities of gait and mobility (R26.89)                Time: 9326-7124 OT Time Calculation (min): 18 min Charges:  OT General Charges $OT Visit: 1 Visit OT Evaluation $OT Eval Low Complexity: 1 Low  Arman Filter., MPH, MS, OTR/L ascom [phone] 10/02/22, 1:35 PM

## 2022-10-02 NOTE — Consult Note (Signed)
NAME:  Joshua Schmidt, MRN:  [id number], DOB:  01/03/1951, LOS: 4 ADMISSION DATE:  09/28/2022, CONSULTATION DATE: 02 October 2022 REFERRING MD: Raphael Gibney, DO, CHIEF COMPLAINT: Shortness of breath, prolonged COPD exacerbation  History of Present Illness:  The patient is a 72 year old current smoker with a 51-pack-year history of smoking history as noted below, presented to [facility] on 28 September 2022 with the complaint of increasing shortness of breath.  The patient was evaluated and admitted after he was noted to be hypoxic with saturations of 78 to 81% on room air upon arrival to the ED and accessory muscle use.  The patient has been requiring anywhere between 4 to 5 L of oxygen during his admission.  At the time of admission he also had significant lower extremity edema.  Patient had noted a 2 to 3-day history of worsening shortness of breath prior to his admission however he does note that he has been short of breath gradually over the last month and a half prior to admission.  She was admitted with the impression of acute respiratory failure with hypoxia with a differential diagnosis of COPD versus CHF exacerbation.  He did have an elevated BNP on admission.  He was treated with Lasix, oxygen, IV steroids and received as needed bronchodilators.  We are asked to assess him because of his persistent hypoxia.  Patient states that he feels markedly better from his admission.  His lower extremity edema has resolved.  His work of breathing has minimized.  Still has some underlying shortness of breath.  No chest pain.  He has had a cough but has not had much sputum production.  Does not endorse any fevers, chills or sweats.  I have reviewed the patient's laboratory data and imaging obtained thus far.  Chest x-ray on admission showed enlarged cardiac silhouette and prominent bilateral interstitial opacities which could represent edema.  CT angio chest did not show any PE patient does have a dilated ascending thoracic  aorta measuring up to 4.5 cm, stable solid left pulmonary nodule, dilated main pulmonary artery and coronary artery calcifications.  Echocardiogram performed 01 October 2022 showed an LVEF of 60 to 65%, moderate left ventricular hypertrophy, grade 1 diastolic dysfunction, moderate enlargement of the right ventricle with mildly reduced systolic function normal pulmonary artery systolic pressure, left atrial size mild to moderately dilated right atrial size moderately dilated.  The patient has worked [employer]) for 30+ years.  Has had exposures to an organic dusts and asbestos in the past.  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  No exposure to agent orange that he is aware of.  He has never been evaluated by a pulmonologist previously.  He is not on inhalers at home.  He has a 51-pack-year history of smoking.  Review of his medications here showed that he is on Solu-Medrol and as needed albuterol.  When I asked the patient if he had been getting albuterol regularly he states that perhaps maybe once a day as he does not ask for the nebulizer.  Pertinent  Medical History   Patient Active Problem List   Diagnosis Date Noted   Acute on chronic diastolic CHF (congestive heart failure) (HCC) 09/28/2022   Lung nodule 09/28/2022   Myocardial injury 09/28/2022   Acute respiratory failure with hypoxia (HCC) 09/28/2022   HLD (hyperlipidemia) 09/28/2022   HTN (hypertension) 09/28/2022   COPD exacerbation (HCC) 09/28/2022   CAD (coronary artery disease) 09/28/2022   DVT (deep venous thrombosis) (  HCC) 09/28/2022   Sleep apnea 06/10/2022   History of adenomatous polyp of colon 03/19/2022   Adenomatous polyp of descending colon 03/19/2022   Traumatic tear of left rotator cuff 02/09/2022   Aneurysm of ascending aorta without rupture (HCC) 02/09/2022   Iron deficiency 10/22/2021   Varicose veins with pain 05/04/2021   Chronic venous insufficiency 05/04/2021   Acute deep vein  thrombosis (DVT) of right lower extremity (HCC) 12/03/2020   Encounter for annual physical exam 09/06/2020   Alcohol abuse 09/06/2020   Bruit of left carotid artery 08/30/2020   Erythrocytosis 12/01/2018   Tobacco abuse 10/27/2018   Sebaceous cyst 11/08/2015   Significant Hospital Events: Including procedures, antibiotic start and stop dates in addition to other pertinent events   06/17 admitted with acute hypoxic respiratory failure Dx: COPD exacerbation versus CHF exacerbation 06/21 PCCM consulted due to slow to resolve hypoxic respiratory failure  Interim History / Subjective:  Patient notes that he feels better than on admission.  Still short of breath but feels he is improving.  Objective   Blood pressure 107/72, pulse 66, temperature 97.8 F (36.6 C), resp. rate 16, height 6\' 3"  (1.905 m), weight 90.3 kg, SpO2 96 %.  SpO2: 96 % O2 Flow Rate (L/min): 5 L/min   Intake/Output Summary (Last 24 hours) at 10/02/2022 1213 Last data filed at 10/02/2022 1051 Gross per 24 hour  Intake 240 ml  Output 2000 ml  Net -1760 ml   Filed Weights   09/29/22 0411 09/30/22 1056 10/01/22 0500  Weight: 92.9 kg 89.9 kg 90.3 kg    Examination: GENERAL: Well-developed, well-nourished gentleman, in no acute distress.  Comfortable with nasal cannula O2 in place.  No conversational dyspnea HEAD: Normocephalic, atraumatic.  EYES: Pupils equal, round, reactive to light.  No scleral icterus.  MOUTH: Poor dentition, oral mucosa moist.  No thrush. NECK: Supple. No thyromegaly. Trachea midline. No JVD.  No adenopathy. PULMONARY: Good air entry bilaterally.  Diffuse end expiratory wheezes throughout, I to E ratio 1:4, few rhonchi. CARDIOVASCULAR: S1 and S2. Regular rate and rhythm.  No rubs, murmurs or gallops heard. ABDOMEN: Benign. MUSCULOSKELETAL: No joint deformity, no clubbing, no edema.  NEUROLOGIC: No overt focal deficit, gait not tested, speech is fluent. SKIN: Intact,warm,dry. PSYCH: Mood and  behavior normal.  Resolved Hospital Problem list     Assessment & Plan:  Acute respiratory failure with hypoxia  D/T COPD exacerbation D/T Diastolic heart failure exacerbation Suspect more acute on chronic respiratory failure Metabolic panel consistent with CO2 retention Corroborate with ABG Wean O2 as tolerated for sats of 88 to 92% Will need oxygen evaluation prior to discharge Ongoing management of diastolic heart failure  COPD exacerbation, prolonged Significant bronchospasm present Undertreated with bronchodilators Begin Brovana plus Pulmicort twice a day via nebulizer Yupelri once a day via nebulizer  Diastolic heart failure exacerbation Cor pulmonale with decompensation Responded to diuretics Has coronary calcifications Is established with cardiology  Erythrocytosis Suspect secondary To tobacco abuse/hypoxia can aggravate Follows with oncology Needs phlebotomy as hematocrit over 52 Has appointment with hematology on 6/28 for phlebotomy ([facility])  Labs   CBC: Recent Labs  Lab 09/28/22 1130 09/29/22 0507 10/02/22 0540  WBC 8.5 5.0 11.5*  HGB 17.1* 17.0 17.3*  HCT 53.6* 53.9* 56.1*  MCV 92.7 92.5 93.2  PLT 221 199 215    Basic Metabolic Panel: Recent Labs  Lab 09/28/22 1130 09/28/22 1359 09/29/22 0507 09/30/22 0346 10/01/22 0436  NA 131*  --  131* 136 137  K 4.3  --  3.7 3.5 3.9  CL 90*  --  87* 90* 85*  CO2 33*  --  35* 37* 41*  GLUCOSE 124*  --  124* 123* 123*  BUN 14  --  13 19 21   CREATININE 0.68  --  0.64 0.69 0.83  CALCIUM 8.9  --  8.4* 8.2* 8.4*  MG  --  1.8  --   --   --    GFR: Estimated Creatinine Clearance: 96.2 mL/min (by C-G formula based on SCr of 0.83 mg/dL). Recent Labs  Lab 09/28/22 1130 09/29/22 0507 10/02/22 0540  PROCALCITON  --   --  <0.10  WBC 8.5 5.0 11.5*    Liver Function Tests: No results for input(s): "AST", "ALT", "ALKPHOS", "BILITOT", "PROT", "ALBUMIN" in the last 168 hours. No results for input(s):  "LIPASE", "AMYLASE" in the last 168 hours. No results for input(s): "AMMONIA" in the last 168 hours.  ABG No results found for: "PHART", "PCO2ART", "PO2ART", "HCO3", "TCO2", "ACIDBASEDEF", "O2SAT"   Coagulation Profile: No results for input(s): "INR", "PROTIME" in the last 168 hours.  Cardiac Enzymes: No results for input(s): "CKTOTAL", "CKMB", "CKMBINDEX", "TROPONINI" in the last 168 hours.  HbA1C: Hgb A1c MFr Bld  Date/Time Value Ref Range Status  09/28/2022 05:24 PM 5.4 4.8 - 5.6 % Final    Comment:    (NOTE) Pre diabetes:          5.7%-6.4%  Diabetes:              >6.4%  Glycemic control for   <7.0% adults with diabetes     CBG: No results for input(s): "GLUCAP" in the last 168 hours.  Review of Systems:   A 10 point review of systems was performed and it is as noted above otherwise negative.  Past Medical History:  He,  has a past medical history of Aneurysm (HCC), Erythrocytosis (12/01/2018), Hyperlipidemia, Hypertension, and Myocardial infarction (HCC).   Surgical History:   Past Surgical History:  Procedure Laterality Date   APPENDECTOMY     COLONOSCOPY WITH PROPOFOL N/A 11/20/2014   Procedure: COLONOSCOPY WITH PROPOFOL;  Surgeon: Kieth Brightly, MD;  Location: [facility] ENDOSCOPY;  Service: Endoscopy;  Laterality: N/A;   COLONOSCOPY WITH PROPOFOL N/A 03/19/2022   Procedure: COLONOSCOPY WITH PROPOFOL;  Surgeon: Toney Reil, MD;  Location: [facility] ENDOSCOPY;  Service: Gastroenterology;  Laterality: N/A;   TONSILLECTOMY AND ADENOIDECTOMY       Social History:   reports that he has been smoking cigarettes. He has a 51.00 pack-year smoking history. He has never used smokeless tobacco. He reports current alcohol use. He reports that he does not use drugs.   Family History:  His family history is not on file.   Allergies No Known Allergies   Home Medications  Prior to Admission medications   Medication Sig Start Date End Date Taking? Authorizing  Provider  aspirin 81 MG chewable tablet Chew 81 mg by mouth daily.   Yes [provider]  atorvastatin (LIPITOR) 20 MG tablet TAKE 1 TABLET BY MOUTH DAILY 09/04/22  Yes Margarita Mail, DO  carvedilol (COREG) 12.5 MG tablet Take 1 tablet (12.5 mg total) by mouth 2 (two) times daily. 08/18/22 08/13/23 Yes Iran Ouch, MD  fluticasone (FLONASE) 50 MCG/ACT nasal spray Place 2 sprays into both nostrils daily. 05/21/22  Yes Margarita Mail, DO  hydrochlorothiazide (HYDRODIURIL) 25 MG tablet TAKE 1 TABLET BY MOUTH DAILY 04/14/22  Yes Corky Downs, MD  losartan (COZAAR) 50 MG  tablet Take 1 tablet (50 mg total) by mouth daily. 06/30/22  Yes Margarita Mail, DO    Scheduled Meds:  aspirin  81 mg Oral Daily   atorvastatin  20 mg Oral Daily   carvedilol  12.5 mg Oral BID WC   enoxaparin (LOVENOX) injection  40 mg Subcutaneous Q24H   fluticasone  2 spray Each Nare Daily   folic acid  1 mg Oral Daily   LORazepam  0-4 mg Intravenous Q12H   losartan  50 mg Oral Daily   methylPREDNISolone (SOLU-MEDROL) injection  40 mg Intravenous Q12H   multivitamin with minerals  1 tablet Oral Daily   nicotine  21 mg Transdermal Daily   thiamine  100 mg Oral Daily   Or   thiamine  100 mg Intravenous Daily   Continuous Infusions: PRN Meds:.acetaminophen, albuterol, dextromethorphan-guaiFENesin, hydrALAZINE, ondansetron (ZOFRAN) IV, mouth rinse   Level 4 consult    Discussion: Patient has significant bronchospasm noted.  He has been undertreated with bronchodilators.  We will start him on a regimen of Brovana plus Pulmicort and Yupelri via nebulizer.  In addition he should have his oxygen weaned to maintain oxygen saturations between 88 to 92%.  I suspect he has significant CHRONIC respiratory failure given his laboratory data and echocardiogram.  Erythrocytosis is likely secondary.  Patient has a history of obstructive sleep apnea however has declined CPAP in the past.  Will need to be reevaluated as  an outpatient.  Upon discharge, he may be switched to LABA/ICS/LAMA combination inhalers for maintenance.  Will also need albuterol for rescue.  We will establish him with [facility], he has an appointment on 7/26 11 AM with Rubye Oaks, NP, nurse practitioner at [facility].  Dr. Rhea Bleacher will see the patient in the morning to reassess.  Gailen Shelter, MD Advanced Bronchoscopy PCCM [facility]   *This note was dictated using voice recognition software/Dragon.  Despite best efforts to proofread, errors can occur which can change the meaning. Any transcriptional errors that result from this process are unintentional and may not be fully corrected at the time of dictation.

## 2022-10-02 NOTE — Progress Notes (Signed)
*  PRELIMINARY RESULTS* Echocardiogram 2D Echocardiogram has been performed.  Cristela Blue 10/02/2022, 11:36 AM

## 2022-10-02 NOTE — Progress Notes (Signed)
PROGRESS NOTE  Joshua Schmidt WGN:[id number] DOB: Aug 25, 1950 DOA: 09/28/2022 PCP: Margarita Mail, DO  HPI/Recap of past 24 hours: Joshua Schmidt is a 72 y.o. male with medical history significant of dCHF, HTN, HLD, COPD, CAD, tobacco abuse(1 pack/day from 2 packs/day), alcohol abuse, OSA, ascending aortic aneurysm, erythrocytosis, right leg DVT not on anticoagulant, who presents with shortness of breath, weight gain. Seen in urgent care, O2 desat, sent to ED>  Was admitted for acute hypoxic respiratory failure secondary to COPD exacerbation and acute on chronic diastolic CHF.  Slow to progress with difficulty weaning off oxygen supplementation.  Pulmonary consulted on 10/02/2022.  10/02/2022: The patient was seen and examined at his bedside.  Very wheezy on exam.  Started on scheduled bronchodilators by pulmonology.  Appreciate Dr. Georgann Housekeeper assistance.    Assessment/Plan: Principal Problem:   Acute respiratory failure with hypoxia (HCC) Active Problems:   Acute on chronic diastolic CHF (congestive heart failure) (HCC)   COPD exacerbation (HCC)   Myocardial injury   CAD (coronary artery disease)   Erythrocytosis   HLD (hyperlipidemia)   HTN (hypertension)   Aneurysm of ascending aorta without rupture (HCC)   Lung nodule   DVT (deep venous thrombosis) (HCC)   Tobacco abuse   Alcohol abuse  Acute respiratory failure with hypoxia (HCC) likely secondary to COPD exacerbation versus acute on chronic diastolic CHF:  Scheduled bronchodilators started by pulmonary on 10/02/2022, Brovana, Pulmicort, Yupelri. On IV Solu-Medrol Completed a course of azithromycin. Continue incentive spirometer Continue to mobilize as tolerated. Wean off oxygen supplementation as tolerated.  Acute on chronic diastolic CHF BNP greater than 784 on 09/28/2022. Chest x-ray with cardiomegaly and mild pulmonary edema 2D echo showing preserved LVEF with no wall motion abnormalities Received IV diuresis,  stopped on 10/02/2022.  BNP 75 on 621.4.. Strict I's and O's and daily weight   Elevated troponin, suspect demand ischemia in the setting of acute hypoxic respiratory failure. High-sensitivity troponin peaked at 232 and downtrended. No evidence of wall motion abnormalities on 2D echo.   Acute COPD exacerbation (HCC): Bronchodilators Solu-Medrol 40 mg IV bid Z pak, completed. Mucinex for cough  Incentive spirometry sputum culture, sputum specimen not acceptable for testing. Nasal cannula oxygen as needed to maintain O2 saturation 90% or greater   Myocardial injury and hx of CAD:  trop 232 -->  tending down. No CP.  Likely due to demand ischemia. Aspirin, Lipitor Trend troponin Follow 2D echo   Erythrocytosis:  Hgb stable, 17.1 (17.4 on 06/10/22) Likely d/t smoking   HLD (hyperlipidemia) LDL is at goal 41. Lipitor   HTN (hypertension) Blood pressure is at goal normotensive Coreg, Cozaar Continue to monitor vital signs with   Aneurysm of ascending aorta without rupture (HCC) Follow-up with PCP   Lung nodule: CTA showed a stable solid left lower lobe pulmonary nodule.  f/u with PCP   Hx of DVT (deep venous thrombosis)-right leg:  not taking anticoagulants. Will repeat lower extremity venous Doppler --> negative  CT angio chest negative for pulmonary embolism.   Tobacco abuse and Alcohol abuse: Smoking cessation counseling provided. The patient is receptive to receiving assistance for tobacco cessation. Currently smokes 1 pack/day from 2 packs/day.  He states he is done with smoking. Nicotine patch  Alcohol abuse with concern for withdrawal CIWA protocol No evidence of alcohol withdrawal at this time Multivitamin, thiamine, folic acid supplement     DVT prophylaxis: lovenox subcu daily  Central lines / invasive devices: none   Code Status:  FULL CODE ACP documentation reviewed: none on file    Current Admission Status: inpatient   TOC needs / Dispo plan:  Anticipate discharge to home on 10/03/2022 or once pulmonary Soft. Barriers to discharge / significant pending items: Pulmonary signing off.         Status is: Inpatient     Objective: Vitals:   10/01/22 1920 10/01/22 2353 10/02/22 0326 10/02/22 0741  BP: 118/65 119/74 114/76 107/72  Pulse: 66 72 68 66  Resp: 18 18 20 16   Temp: 98 F (36.7 C) 98.5 F (36.9 C) 97.8 F (36.6 C) 97.8 F (36.6 C)  TempSrc: Oral Oral    SpO2: 93% 94% 90% 96%  Weight:      Height:        Intake/Output Summary (Last 24 hours) at 10/02/2022 1221 Last data filed at 10/02/2022 1051 Gross per 24 hour  Intake 240 ml  Output 2000 ml  Net -1760 ml   Filed Weights   09/29/22 0411 09/30/22 1056 10/01/22 0500  Weight: 92.9 kg 89.9 kg 90.3 kg    Exam:  General: 72 y.o. year-old male well developed well nourished in no acute distress.  Alert and oriented x3. Cardiovascular: Regular rate and rhythm with no rubs or gallops.  No thyromegaly or JVD noted.   Respiratory: Diffuse wheezing bilaterally Abdomen: Soft nontender nondistended with normal bowel sounds x4 quadrants. Musculoskeletal: No lower extremity edema. 2/4 pulses in all 4 extremities. Skin: No ulcerative lesions noted or rashes, Psychiatry: Mood is appropriate for condition and setting   Data Reviewed: CBC: Recent Labs  Lab 09/28/22 1130 09/29/22 0507 10/02/22 0540  WBC 8.5 5.0 11.5*  HGB 17.1* 17.0 17.3*  HCT 53.6* 53.9* 56.1*  MCV 92.7 92.5 93.2  PLT 221 199 215   Basic Metabolic Panel: Recent Labs  Lab 09/28/22 1130 09/28/22 1359 09/29/22 0507 09/30/22 0346 10/01/22 0436  NA 131*  --  131* 136 137  K 4.3  --  3.7 3.5 3.9  CL 90*  --  87* 90* 85*  CO2 33*  --  35* 37* 41*  GLUCOSE 124*  --  124* 123* 123*  BUN 14  --  13 19 21   CREATININE 0.68  --  0.64 0.69 0.83  CALCIUM 8.9  --  8.4* 8.2* 8.4*  MG  --  1.8  --   --   --    GFR: Estimated Creatinine Clearance: 96.2 mL/min (by C-G formula based on SCr of 0.83  mg/dL). Liver Function Tests: No results for input(s): "AST", "ALT", "ALKPHOS", "BILITOT", "PROT", "ALBUMIN" in the last 168 hours. No results for input(s): "LIPASE", "AMYLASE" in the last 168 hours. No results for input(s): "AMMONIA" in the last 168 hours. Coagulation Profile: No results for input(s): "INR", "PROTIME" in the last 168 hours. Cardiac Enzymes: No results for input(s): "CKTOTAL", "CKMB", "CKMBINDEX", "TROPONINI" in the last 168 hours. BNP (last 3 results) No results for input(s): "PROBNP" in the last 8760 hours. HbA1C: No results for input(s): "HGBA1C" in the last 72 hours.  CBG: No results for input(s): "GLUCAP" in the last 168 hours. Lipid Profile: No results for input(s): "CHOL", "HDL", "LDLCALC", "TRIG", "CHOLHDL", "LDLDIRECT" in the last 72 hours.  Thyroid Function Tests: No results for input(s): "TSH", "T4TOTAL", "FREET4", "T3FREE", "THYROIDAB" in the last 72 hours. Anemia Panel: No results for input(s): "VITAMINB12", "FOLATE", "FERRITIN", "TIBC", "IRON", "RETICCTPCT" in the last 72 hours. Urine analysis:    Component Value Date/Time   BILIRUBINUR neg 09/06/2020 1012  PROTEINUR Negative 09/06/2020 1012   UROBILINOGEN negative (A) 09/06/2020 1012   NITRITE neg 09/06/2020 1012   LEUKOCYTESUR Negative 09/06/2020 1012   Sepsis Labs: @LABRCNTIP (procalcitonin:4,lacticidven:4)  ) Recent Results (from the past 240 hour(s))  Resp Panel by RT-PCR (Flu A&B, Covid) Anterior Nasal Swab     Status: None   Collection Time: 09/28/22  1:57 PM   Specimen: Anterior Nasal Swab  Result Value Ref Range Status   SARS Coronavirus 2 by RT PCR NEGATIVE NEGATIVE Final    Comment: (NOTE) SARS-CoV-2 target nucleic acids are NOT DETECTED.  The SARS-CoV-2 RNA is generally detectable in upper respiratory specimens during the acute phase of infection. The lowest concentration of SARS-CoV-2 viral copies this assay can detect is 138 copies/mL. A negative result does not preclude  SARS-Cov-2 infection and should not be used as the sole basis for treatment or other patient management decisions. A negative result may occur with  improper specimen collection/handling, submission of specimen other than nasopharyngeal swab, presence of viral mutation(s) within the areas targeted by this assay, and inadequate number of viral copies(<138 copies/mL). A negative result must be combined with clinical observations, patient history, and epidemiological information. The expected result is Negative.  Fact Sheet for Patients:  BloggerCourse.com  Fact Sheet for Healthcare Providers:  SeriousBroker.it  This test is no t yet approved or cleared by the [place] FDA and  has been authorized for detection and/or diagnosis of SARS-CoV-2 by FDA under an Emergency Use Authorization (EUA). This EUA will remain  in effect (meaning this test can be used) for the duration of the COVID-19 declaration under Section 564(b)(1) of the Act, 21 U.S.C.section 360bbb-3(b)(1), unless the authorization is terminated  or revoked sooner.       Influenza A by PCR NEGATIVE NEGATIVE Final   Influenza B by PCR NEGATIVE NEGATIVE Final    Comment: (NOTE) The Xpert Xpress SARS-CoV-2/FLU/RSV plus assay is intended as an aid in the diagnosis of influenza from Nasopharyngeal swab specimens and should not be used as a sole basis for treatment. Nasal washings and aspirates are unacceptable for Xpert Xpress SARS-CoV-2/FLU/RSV testing.  Fact Sheet for Patients: BloggerCourse.com  Fact Sheet for Healthcare Providers: SeriousBroker.it  This test is not yet approved or cleared by the [place] FDA and has been authorized for detection and/or diagnosis of SARS-CoV-2 by FDA under an Emergency Use Authorization (EUA). This EUA will remain in effect (meaning this test can be used) for the duration of  the COVID-19 declaration under Section 564(b)(1) of the Act, 21 U.S.C. section 360bbb-3(b)(1), unless the authorization is terminated or revoked.  Performed at [facility], [address] [place], [place] [address]   Expectorated Sputum Assessment w Gram Stain, Rflx to Resp Cult     Status: None   Collection Time: 09/30/22  5:50 PM   Specimen: SPU  Result Value Ref Range Status   Specimen Description SPUTUM  Final   Special Requests EXPSU  Final   Sputum evaluation   Final    Sputum specimen not acceptable for testing.  Please recollect.   CALLED TO SAMER HARBAWI 09/30/22 1833 KLW Performed at [facility], [address] [place] [place], [place] [address]    Report Status 09/30/2022 FINAL  Final      Studies: ECHOCARDIOGRAM COMPLETE  Result Date: 10/01/2022    ECHOCARDIOGRAM REPORT   Patient Name:   Joshua Schmidt Date of Exam: 10/01/2022 Medical Rec #:  [id number]       Height:  75.0 in Accession #:    [id number]      Weight:       199.1 lb Date of Birth:  13-Jun-1950       BSA:          2.190 m Patient Age:    71 years        BP:           110/67 mmHg Patient Gender: M               HR:           66 bpm. Exam Location:  [facility] Procedure: 2D Echo, Cardiac Doppler and Color Doppler Indications:     CHF  History:         Patient has prior history of Echocardiogram examinations, most                  recent 04/15/2022. CHF, CAD, COPD, Signs/Symptoms:Shortness of                  Breath; Risk Factors:Hypertension, Sleep Apnea, Dyslipidemia                  and Current Smoker.  Sonographer:     Mikki Harbor Referring Phys:  [id number] Sunnie Nielsen Diagnosing Phys: Julien Nordmann MD  Sonographer Comments: Technically difficult study due to poor echo windows. Image acquisition challenging due to COPD. IMPRESSIONS  1. Left ventricular ejection fraction, by estimation, is 60 to 65%. The left ventricle has normal function. The left ventricle has no regional wall motion  abnormalities. There is moderate left ventricular hypertrophy. Left ventricular diastolic parameters are consistent with Grade I diastolic dysfunction (impaired relaxation).  2. Right ventricular systolic function is mildly reduced. The right ventricular size is moderately enlarged. There is normal pulmonary artery systolic pressure. The estimated right ventricular systolic pressure is 21.8 mmHg.  3. Left atrial size was mild to moderately dilated.  4. Right atrial size was moderately dilated.  5. The mitral valve is normal in structure. Mild mitral valve regurgitation. No evidence of mitral stenosis.  6. The aortic valve is tricuspid. Aortic valve regurgitation is not visualized. Aortic valve sclerosis is present, with no evidence of aortic valve stenosis.  7. There is mild dilatation of the aortic root, measuring 44 mm. There is moderate dilatation of the ascending aorta, measuring 47 mm.  8. The inferior vena cava is normal in size with greater than 50% respiratory variability, suggesting right atrial pressure of 3 mmHg. FINDINGS  Left Ventricle: Left ventricular ejection fraction, by estimation, is 60 to 65%. The left ventricle has normal function. The left ventricle has no regional wall motion abnormalities. The left ventricular internal cavity size was normal in size. There is  moderate left ventricular hypertrophy. Left ventricular diastolic parameters are consistent with Grade I diastolic dysfunction (impaired relaxation). Right Ventricle: The right ventricular size is moderately enlarged. No increase in right ventricular wall thickness. Right ventricular systolic function is mildly reduced. There is normal pulmonary artery systolic pressure. The tricuspid regurgitant velocity is 1.86 m/s, and with an assumed right atrial pressure of 8 mmHg, the estimated right ventricular systolic pressure is 21.8 mmHg. Left Atrium: Left atrial size was mild to moderately dilated. Right Atrium: Right atrial size was  moderately dilated. Pericardium: There is no evidence of pericardial effusion. Mitral Valve: The mitral valve is normal in structure. Mild mitral annular calcification. Mild mitral valve regurgitation. No evidence of mitral valve stenosis. MV peak gradient, 3.8 mmHg. The  mean mitral valve gradient is 1.0 mmHg. Tricuspid Valve: The tricuspid valve is normal in structure. Tricuspid valve regurgitation is mild . No evidence of tricuspid stenosis. Aortic Valve: The aortic valve is tricuspid. Aortic valve regurgitation is not visualized. Aortic valve sclerosis is present, with no evidence of aortic valve stenosis. Aortic valve mean gradient measures 4.0 mmHg. Aortic valve peak gradient measures 8.8  mmHg. Aortic valve area, by VTI measures 4.11 cm. Pulmonic Valve: The pulmonic valve was normal in structure. Pulmonic valve regurgitation is not visualized. No evidence of pulmonic stenosis. Aorta: The aortic root is normal in size and structure. There is mild dilatation of the aortic root, measuring 44 mm. There is moderate dilatation of the ascending aorta, measuring 47 mm. Venous: The inferior vena cava is normal in size with greater than 50% respiratory variability, suggesting right atrial pressure of 3 mmHg. IAS/Shunts: No atrial level shunt detected by color flow Doppler.  LEFT VENTRICLE PLAX 2D LVIDd:         5.00 cm   Diastology LVIDs:         2.40 cm   LV e' medial:    7.40 cm/s LV PW:         1.50 cm   LV E/e' medial:  8.3 LV IVS:        1.60 cm   LV e' lateral:   7.62 cm/s LVOT diam:     2.20 cm   LV E/e' lateral: 8.0 LV SV:         113 LV SV Index:   52 LVOT Area:     3.80 cm  RIGHT VENTRICLE RV Basal diam:  4.55 cm RV Mid diam:    5.40 cm RV S prime:     18.00 cm/s TAPSE (M-mode): 3.6 cm LEFT ATRIUM              Index        RIGHT ATRIUM           Index LA diam:        5.00 cm  2.28 cm/m   RA Area:     33.00 cm LA Vol (A2C):   102.0 ml 46.57 ml/m  RA Volume:   132.00 ml 60.27 ml/m LA Vol (A4C):   67.4 ml   30.78 ml/m LA Biplane Vol: 83.8 ml  38.26 ml/m  AORTIC VALVE                    PULMONIC VALVE AV Area (Vmax):    3.52 cm     PV Vmax:       1.37 m/s AV Area (Vmean):   3.38 cm     PV Peak grad:  7.5 mmHg AV Area (VTI):     4.11 cm AV Vmax:           148.00 cm/s AV Vmean:          98.100 cm/s AV VTI:            0.275 m AV Peak Grad:      8.8 mmHg AV Mean Grad:      4.0 mmHg LVOT Vmax:         137.00 cm/s LVOT Vmean:        87.100 cm/s LVOT VTI:          0.297 m LVOT/AV VTI ratio: 1.08  AORTA Ao Root diam: 4.40 cm Ao Asc diam:  4.60 cm MITRAL VALVE  TRICUSPID VALVE MV Area (PHT): 2.19 cm    TR Peak grad:   13.8 mmHg MV Area VTI:   3.85 cm    TR Vmax:        186.00 cm/s MV Peak grad:  3.8 mmHg MV Mean grad:  1.0 mmHg    SHUNTS MV Vmax:       0.98 m/s    Systemic VTI:  0.30 m MV Vmean:      46.5 cm/s   Systemic Diam: 2.20 cm MV Decel Time: 346 msec MV E velocity: 61.30 cm/s MV A velocity: 85.90 cm/s MV E/A ratio:  0.71 Julien Nordmann MD Electronically signed by Julien Nordmann MD Signature Date/Time: 10/01/2022/4:55:11 PM    Final     Scheduled Meds:  arformoterol  15 mcg Nebulization BID   aspirin  81 mg Oral Daily   atorvastatin  20 mg Oral Daily   budesonide (PULMICORT) nebulizer solution  0.5 mg Nebulization BID   carvedilol  12.5 mg Oral BID WC   enoxaparin (LOVENOX) injection  40 mg Subcutaneous Q24H   fluticasone  2 spray Each Nare Daily   folic acid  1 mg Oral Daily   LORazepam  0-4 mg Intravenous Q12H   losartan  50 mg Oral Daily   methylPREDNISolone (SOLU-MEDROL) injection  40 mg Intravenous Q12H   multivitamin with minerals  1 tablet Oral Daily   nicotine  21 mg Transdermal Daily   revefenacin  175 mcg Nebulization Daily   thiamine  100 mg Oral Daily   Or   thiamine  100 mg Intravenous Daily    Continuous Infusions:   LOS: 4 days     Darlin Drop, MD [facility] Pager [phone]  If 7PM-7AM, please contact night-coverage [email or web address] Password  [facility] 10/02/2022, 12:21 PM

## 2022-10-02 NOTE — Evaluation (Signed)
Physical Therapy Evaluation Patient Details Name: Joshua Schmidt MRN: [id number] DOB: [birth year] Today's Date: 10/02/2022  History of Present Illness  Pt is a 72 y.o. male presenting to hospital 6/17 with c/o SOB.  Pt admitted with acute on chronic diastolic CHF, acute respiratory failure with hypoxia, COPD exacerbation, MI injury, and erythrocytosis  PMH includes COPD, h/o DVT, chronic venous insufficinecy, h/o MI, CHF, htn, CAD, OSA, AAA.  Clinical Impression  Prior to hospital admission, pt was independent with functional mobility; lives with his wife in 1 level home with 3 platform steps to enter with B railings; h/o falls.  Currently pt is modified independent with bed mobility; independent with transfers; and SBA ambulating around nursing loop.  Pt's SpO2 sats 92% at rest on 5 L O2 via nasal cannula beginning/end of session; pt's SpO2 sats 90% or greater on 6 L O2 via nasal cannula during sessions activities/walking (mild SOB noted).  Pt would currently benefit from skilled PT to address noted impairments and functional limitations during hospitalization (see below for any additional details).  Upon hospital discharge, no further therapy needs anticipated.    Recommendations for follow up therapy are one component of a multi-disciplinary discharge planning process, led by the attending physician.  Recommendations may be updated based on patient status, additional functional criteria and insurance authorization.        Assistance Recommended at Discharge PRN  Patient can return home with the following  Assistance with cooking/housework;Assist for transportation;Help with stairs or ramp for entrance    Equipment Recommendations None recommended by PT  Recommendations for Other Services       Functional Status Assessment Patient has had a recent decline in their functional status and demonstrates the ability to make significant improvements in function in a reasonable and predictable  amount of time.     Precautions / Restrictions Precautions Precautions: None Restrictions Weight Bearing Restrictions: No      Mobility  Bed Mobility Overal bed mobility: Modified Independent             General bed mobility comments: Semi-supine to sitting without any noted difficulties.    Transfers Overall transfer level: Independent Equipment used: None               General transfer comment: steady safe transfer from bed x2 trials    Ambulation/Gait Ambulation/Gait assistance: Supervision Gait Distance (Feet): 220 Feet Assistive device: None Gait Pattern/deviations: Step-through pattern Gait velocity: normal     General Gait Details: steady ambulation  Stairs            Wheelchair Mobility    Modified Rankin (Stroke Patients Only)       Balance Overall balance assessment: Independent Sitting-balance support: No upper extremity supported, Feet supported Sitting balance-Leahy Scale: Normal Sitting balance - Comments: steady reaching outside BOS   Standing balance support: No upper extremity supported, During functional activity Standing balance-Leahy Scale: Normal Standing balance comment: steady ambulation                             Pertinent Vitals/Pain Pain Assessment Pain Assessment: No/denies pain HR WFL during sessions activities.    Home Living Family/patient expects to be discharged to:: Private residence Living Arrangements: Spouse/significant other Available Help at Discharge: Family;Available 24 hours/day Type of Home: House Home Access: Stairs to enter Entrance Stairs-Rails: Doctor, general practice of Steps: 3 platform steps   Home Layout: One level Home Equipment: Other (comment);Grab bars -  tub/shower (walking stick)      Prior Function Prior Level of Function : Independent/Modified Independent             Mobility Comments: Pt is a retired Product/process development scientist.  Occasional use of  walking stick when going for walks.  H/o 3 recent falls (various activities). ADLs Comments: Independent     Hand Dominance        Extremity/Trunk Assessment   Upper Extremity Assessment Upper Extremity Assessment: Overall WFL for tasks assessed    Lower Extremity Assessment Lower Extremity Assessment: Generalized weakness    Cervical / Trunk Assessment Cervical / Trunk Assessment: Normal  Communication   Communication: No difficulties  Cognition Arousal/Alertness: Awake/alert Behavior During Therapy: WFL for tasks assessed/performed Overall Cognitive Status: Within Functional Limits for tasks assessed                                          General Comments General comments (skin integrity, edema, etc.): blood (appearing old to semi-old) noted on pt's bed sheets when pt stood up--unsure of where it came from--nurse notified.  Nursing cleared pt for participation in physical therapy.  Pt agreeable to PT session.    Exercises  Pt educated on pacing and activity modification: pt verbalizing appropriate understanding.   Assessment/Plan    PT Assessment Patient needs continued PT services  PT Problem List Decreased strength;Decreased activity tolerance;Decreased mobility;Cardiopulmonary status limiting activity       PT Treatment Interventions DME instruction;Gait training;Stair training;Functional mobility training;Therapeutic activities;Therapeutic exercise;Balance training;Patient/family education    PT Goals (Current goals can be found in the Care Plan section)  Acute Rehab PT Goals Patient Stated Goal: to improve breathing PT Goal Formulation: With patient Time For Goal Achievement: 10/16/22 Potential to Achieve Goals: Fair    Frequency Min 2X/week     Co-evaluation               AM-PAC PT "6 Clicks" Mobility  Outcome Measure Help needed turning from your back to your side while in a flat bed without using bedrails?: None Help needed  moving from lying on your back to sitting on the side of a flat bed without using bedrails?: None Help needed moving to and from a bed to a chair (including a wheelchair)?: None Help needed standing up from a chair using your arms (e.g., wheelchair or bedside chair)?: None Help needed to walk in hospital room?: A Little Help needed climbing 3-5 steps with a railing? : A Little 6 Click Score: 22    End of Session Equipment Utilized During Treatment: Gait belt;Oxygen (5 L at rest; 6 L with activity (on O2 tank)) Activity Tolerance: Patient tolerated treatment well Patient left:  (sitting on edge of bed with OT present) Nurse Communication: Mobility status;Precautions;Other (comment) (blood noted on pt's bed linen; pt's SpO2 sats during session) PT Visit Diagnosis: Other abnormalities of gait and mobility (R26.89);Muscle weakness (generalized) (M62.81)    Time: 1610-9604 PT Time Calculation (min) (ACUTE ONLY): 22 min   Charges:   PT Evaluation $PT Eval Low Complexity: 1 Low PT Treatments $Therapeutic Activity: 8-22 mins       Hendricks Limes, PT 10/02/22, 1:18 PM

## 2022-10-02 NOTE — Plan of Care (Signed)
Patient's arterial blood gas has been resulted:  ABG    Component Value Date/Time   PHART 7.45 10/02/2022 1540   PCO2ART 78 (HH) 10/02/2022 1540   PO2ART 69 (L) 10/02/2022 1540   HCO3 54.2 (H) 10/02/2022 1540   O2SAT 95.2 10/02/2022 1540   Recommend to withhold any diuretics.  Given pH, patient is well compensated so he has by definition chronic respiratory failure with hypoxia and hypercarbia.  Therefore diagnosis should be updated to acute on chronic respiratory failure with hypoxia and hypercarbia.  It is imperative to decrease O2 as tolerated to keep sats between 88 to 92%.  Higher flows of oxygen will lead to more CO2 retention.  May benefit from one-time dose of Diamox.  I have written medications to optimize respiratory status.  He may benefit from noninvasive ventilation at nighttime however, he has had a prior diagnosis of obstructive sleep apnea in the past and declined CPAP.  Not sure how compliant he would be with a noninvasive ventilator (Trilogy/AVAPS).   Gailen Shelter, MD Advanced Bronchoscopy P[facility]    *This note was dictated using voice recognition software/Dragon.  Despite best efforts to proofread, errors can occur which can change the meaning. Any transcriptional errors that result from this process are unintentional and may not be fully corrected at the time of dictation.

## 2022-10-03 DIAGNOSIS — J9601 Acute respiratory failure with hypoxia: Secondary | ICD-10-CM | POA: Diagnosis not present

## 2022-10-03 MED ORDER — METHYLPREDNISOLONE SODIUM SUCC 40 MG IJ SOLR
40.0000 mg | Freq: Every day | INTRAMUSCULAR | Status: DC
  Administered 2022-10-04 – 2022-10-05 (×2): 40 mg via INTRAVENOUS
  Filled 2022-10-03 (×2): qty 1

## 2022-10-03 NOTE — Progress Notes (Signed)
[facility]  - [place] [facility]   PATIENT NAME: Joshua Schmidt    MR#:  [id number]  DATE OF BIRTH:  10/09/1950  SUBJECTIVE:      VITALS:  Blood pressure 107/70, pulse (!) 59, temperature 98.3 F (36.8 C), resp. rate 16, height 6\' 3"  (1.905 m), weight 88 kg, SpO2 90 %.  PHYSICAL EXAMINATION:   GENERAL:  72 y.o.-year-old patient with no acute distress.  LUNGS: Normal breath sounds bilaterally, no wheezing CARDIOVASCULAR: S1, S2 normal. No murmur   ABDOMEN: Soft, nontender, nondistended. Bowel sounds present.  EXTREMITIES: No  edema b/l.    NEUROLOGIC: nonfocal  patient is alert and awake SKIN: No obvious rash, lesion, or ulcer.   LABORATORY PANEL:  CBC Recent Labs  Lab 10/02/22 0540  WBC 11.5*  HGB 17.3*  HCT 56.1*  PLT 215    Chemistries  Recent Labs  Lab 09/28/22 1359 09/29/22 0507 10/01/22 0436  NA  --    < > 137  K  --    < > 3.9  CL  --    < > 85*  CO2  --    < > 41*  GLUCOSE  --    < > 123*  BUN  --    < > 21  CREATININE  --    < > 0.83  CALCIUM  --    < > 8.4*  MG 1.8  --   --    < > = values in this interval not displayed.   Cardiac Enzymes No results for input(s): "TROPONINI" in the last 168 hours. RADIOLOGY:  ECHOCARDIOGRAM LIMITED  Result Date: 10/02/2022    ECHOCARDIOGRAM LIMITED REPORT   Patient Name:   Joshua Schmidt Date of Exam: 10/02/2022 Medical Rec #:  [id number]       Height:       75.0 in Accession #:    [id number]      Weight:       199.1 lb Date of Birth:  1950/07/21       BSA:          2.190 m Patient Age:    71 years        BP:           117/80 mmHg Patient Gender: M               HR:           67 bpm. Exam Location:  [facility] Procedure: Limited Echo, Color Doppler and Cardiac Doppler Indications:     Dyspnea R06.00                  Elevated Troponin  History:         Patient has prior history of Echocardiogram examinations, most                  recent 10/01/2022. Previous Myocardial Infarction; Risk                   Factors:Hypertension and Dyslipidemia.  Sonographer:     Cristela Blue Referring Phys:  [id number] Oliver Pila HALL Diagnosing Phys: Julien Nordmann MD IMPRESSIONS  1. Left ventricular ejection fraction, by estimation, is 60 to 65%. The left ventricle has normal function. The left ventricle has no regional wall motion abnormalities. There is mild left ventricular hypertrophy.  2. Right ventricular systolic function is normal. The right ventricular size is normal. Tricuspid regurgitation signal is inadequate for assessing PA pressure.  3. The mitral valve is normal in structure. No evidence of mitral valve regurgitation. No evidence of mitral stenosis.  4. The aortic valve is tricuspid. Aortic valve regurgitation is not visualized. Aortic valve sclerosis is present, with no evidence of aortic valve stenosis.  5. The inferior vena cava is normal in size with greater than 50% respiratory variability, suggesting right atrial pressure of 3 mmHg. FINDINGS  Left Ventricle: Left ventricular ejection fraction, by estimation, is 60 to 65%. The left ventricle has normal function. The left ventricle has no regional wall motion abnormalities. The left ventricular internal cavity size was normal in size. There is  mild left ventricular hypertrophy. Right Ventricle: The right ventricular size is normal. No increase in right ventricular wall thickness. Right ventricular systolic function is normal. Tricuspid regurgitation signal is inadequate for assessing PA pressure. Left Atrium: Left atrial size was normal in size. Right Atrium: Right atrial size was normal in size. Pericardium: There is no evidence of pericardial effusion. Mitral Valve: The mitral valve is normal in structure. Mild mitral annular calcification. No evidence of mitral valve stenosis. Tricuspid Valve: The tricuspid valve is normal in structure. Tricuspid valve regurgitation is not demonstrated. No evidence of tricuspid stenosis. Aortic Valve: The aortic valve is tricuspid.  Aortic valve regurgitation is not visualized. Aortic valve sclerosis is present, with no evidence of aortic valve stenosis. Pulmonic Valve: The pulmonic valve was normal in structure. Pulmonic valve regurgitation is not visualized. No evidence of pulmonic stenosis. Aorta: The aortic root is normal in size and structure. Venous: The inferior vena cava is normal in size with greater than 50% respiratory variability, suggesting right atrial pressure of 3 mmHg. IAS/Shunts: No atrial level shunt detected by color flow Doppler. Additional Comments: Spectral Doppler performed. Color Doppler performed.  LEFT VENTRICLE PLAX 2D LVIDd:         5.40 cm LVIDs:         3.40 cm LV PW:         1.30 cm LV IVS:        1.20 cm LVOT diam:     2.10 cm LVOT Area:     3.46 cm  RIGHT VENTRICLE RV Basal diam:  4.90 cm RV Mid diam:    4.10 cm LEFT ATRIUM           Index        RIGHT ATRIUM           Index LA diam:      3.40 cm 1.55 cm/m   RA Area:     18.10 cm LA Vol (A2C): 51.4 ml 23.47 ml/m  RA Volume:   49.90 ml  22.78 ml/m LA Vol (A4C): 75.2 ml 34.34 ml/m   AORTA Ao Root diam: 4.00 cm  SHUNTS Systemic Diam: 2.10 cm Julien Nordmann MD Electronically signed by Julien Nordmann MD Signature Date/Time: 10/02/2022/4:48:19 PM    Final    ECHOCARDIOGRAM COMPLETE  Result Date: 10/01/2022    ECHOCARDIOGRAM REPORT   Patient Name:   Joshua Schmidt Date of Exam: 10/01/2022 Medical Rec #:  [id number]       Height:       75.0 in Accession #:    [id number]      Weight:       199.1 lb Date of Birth:  [birth year]       BSA:          2.190 m Patient Age:    39 years  BP:           110/67 mmHg Patient Gender: M               HR:           66 bpm. Exam Location:  [facility] Procedure: 2D Echo, Cardiac Doppler and Color Doppler Indications:     CHF  History:         Patient has prior history of Echocardiogram examinations, most                  recent 04/15/2022. CHF, CAD, COPD, Signs/Symptoms:Shortness of                  Breath; Risk  Factors:Hypertension, Sleep Apnea, Dyslipidemia                  and Current Smoker.  Sonographer:     Mikki Harbor Referring Phys:  [id number] Sunnie Nielsen Diagnosing Phys: Julien Nordmann MD  Sonographer Comments: Technically difficult study due to poor echo windows. Image acquisition challenging due to COPD. IMPRESSIONS  1. Left ventricular ejection fraction, by estimation, is 60 to 65%. The left ventricle has normal function. The left ventricle has no regional wall motion abnormalities. There is moderate left ventricular hypertrophy. Left ventricular diastolic parameters are consistent with Grade I diastolic dysfunction (impaired relaxation).  2. Right ventricular systolic function is mildly reduced. The right ventricular size is moderately enlarged. There is normal pulmonary artery systolic pressure. The estimated right ventricular systolic pressure is 21.8 mmHg.  3. Left atrial size was mild to moderately dilated.  4. Right atrial size was moderately dilated.  5. The mitral valve is normal in structure. Mild mitral valve regurgitation. No evidence of mitral stenosis.  6. The aortic valve is tricuspid. Aortic valve regurgitation is not visualized. Aortic valve sclerosis is present, with no evidence of aortic valve stenosis.  7. There is mild dilatation of the aortic root, measuring 44 mm. There is moderate dilatation of the ascending aorta, measuring 47 mm.  8. The inferior vena cava is normal in size with greater than 50% respiratory variability, suggesting right atrial pressure of 3 mmHg. FINDINGS  Left Ventricle: Left ventricular ejection fraction, by estimation, is 60 to 65%. The left ventricle has normal function. The left ventricle has no regional wall motion abnormalities. The left ventricular internal cavity size was normal in size. There is  moderate left ventricular hypertrophy. Left ventricular diastolic parameters are consistent with Grade I diastolic dysfunction (impaired relaxation). Right  Ventricle: The right ventricular size is moderately enlarged. No increase in right ventricular wall thickness. Right ventricular systolic function is mildly reduced. There is normal pulmonary artery systolic pressure. The tricuspid regurgitant velocity is 1.86 m/s, and with an assumed right atrial pressure of 8 mmHg, the estimated right ventricular systolic pressure is 21.8 mmHg. Left Atrium: Left atrial size was mild to moderately dilated. Right Atrium: Right atrial size was moderately dilated. Pericardium: There is no evidence of pericardial effusion. Mitral Valve: The mitral valve is normal in structure. Mild mitral annular calcification. Mild mitral valve regurgitation. No evidence of mitral valve stenosis. MV peak gradient, 3.8 mmHg. The mean mitral valve gradient is 1.0 mmHg. Tricuspid Valve: The tricuspid valve is normal in structure. Tricuspid valve regurgitation is mild . No evidence of tricuspid stenosis. Aortic Valve: The aortic valve is tricuspid. Aortic valve regurgitation is not visualized. Aortic valve sclerosis is present, with no evidence of aortic valve stenosis. Aortic valve mean gradient measures 4.0 mmHg.  Aortic valve peak gradient measures 8.8  mmHg. Aortic valve area, by VTI measures 4.11 cm. Pulmonic Valve: The pulmonic valve was normal in structure. Pulmonic valve regurgitation is not visualized. No evidence of pulmonic stenosis. Aorta: The aortic root is normal in size and structure. There is mild dilatation of the aortic root, measuring 44 mm. There is moderate dilatation of the ascending aorta, measuring 47 mm. Venous: The inferior vena cava is normal in size with greater than 50% respiratory variability, suggesting right atrial pressure of 3 mmHg. IAS/Shunts: No atrial level shunt detected by color flow Doppler.  LEFT VENTRICLE PLAX 2D LVIDd:         5.00 cm   Diastology LVIDs:         2.40 cm   LV e' medial:    7.40 cm/s LV PW:         1.50 cm   LV E/e' medial:  8.3 LV IVS:        1.60  cm   LV e' lateral:   7.62 cm/s LVOT diam:     2.20 cm   LV E/e' lateral: 8.0 LV SV:         113 LV SV Index:   52 LVOT Area:     3.80 cm  RIGHT VENTRICLE RV Basal diam:  4.55 cm RV Mid diam:    5.40 cm RV S prime:     18.00 cm/s TAPSE (M-mode): 3.6 cm LEFT ATRIUM              Index        RIGHT ATRIUM           Index LA diam:        5.00 cm  2.28 cm/m   RA Area:     33.00 cm LA Vol (A2C):   102.0 ml 46.57 ml/m  RA Volume:   132.00 ml 60.27 ml/m LA Vol (A4C):   67.4 ml  30.78 ml/m LA Biplane Vol: 83.8 ml  38.26 ml/m  AORTIC VALVE                    PULMONIC VALVE AV Area (Vmax):    3.52 cm     PV Vmax:       1.37 m/s AV Area (Vmean):   3.38 cm     PV Peak grad:  7.5 mmHg AV Area (VTI):     4.11 cm AV Vmax:           148.00 cm/s AV Vmean:          98.100 cm/s AV VTI:            0.275 m AV Peak Grad:      8.8 mmHg AV Mean Grad:      4.0 mmHg LVOT Vmax:         137.00 cm/s LVOT Vmean:        87.100 cm/s LVOT VTI:          0.297 m LVOT/AV VTI ratio: 1.08  AORTA Ao Root diam: 4.40 cm Ao Asc diam:  4.60 cm MITRAL VALVE               TRICUSPID VALVE MV Area (PHT): 2.19 cm    TR Peak grad:   13.8 mmHg MV Area VTI:   3.85 cm    TR Vmax:        186.00 cm/s MV Peak grad:  3.8 mmHg MV Mean grad:  1.0 mmHg  SHUNTS MV Vmax:       0.98 m/s    Systemic VTI:  0.30 m MV Vmean:      46.5 cm/s   Systemic Diam: 2.20 cm MV Decel Time: 346 msec MV E velocity: 61.30 cm/s MV A velocity: 85.90 cm/s MV E/A ratio:  0.71 Julien Nordmann MD Electronically signed by Julien Nordmann MD Signature Date/Time: 10/01/2022/4:55:11 PM    Final     Assessment and Plan Joshua Schmidt is a 72 y.o. male with medical history significant of dCHF, HTN, HLD, COPD, CAD, tobacco abuse(1 pack/day from 2 packs/day), alcohol abuse, OSA, ascending aortic aneurysm, erythrocytosis, right leg DVT not on anticoagulant, who presents with shortness of breath, weight gain. Seen in urgent care, O2 desat, sent to ED>  Was admitted for acute hypoxic respiratory  failure secondary to COPD exacerbation and acute on chronic diastolic CHF.  Slow to progress with difficulty weaning off oxygen supplementation.  Pulmonary consulted on 10/02/2022.  Acute respiratory failure with hypoxia (HCC) likely secondary to COPD exacerbation versus acute on chronic diastolic CHF:  Scheduled bronchodilators started by pulmonary on 10/02/2022, Brovana, Pulmicort, Yupelri. On IV Solu-Medrol--can swtich to oral from 6/23 Completed a course of azithromycin. Continue incentive spirometer Wean off oxygen supplementation as tolerated. Patient qualified for home oxygen. Order place. Will do DME nebulizer as well patient will follow-up with Dr. Corliss Skains pulmonary as outpatient   Acute on chronic diastolic CHF BNP greater than 161 on 09/28/2022. Chest x-ray with cardiomegaly and mild pulmonary edema 2D echo showing preserved LVEF with no wall motion abnormalities Received IV diuresis, stopped on 10/02/2022.  BNP 75 on 10/02/22.Marland Kitchen   Elevated troponin, suspect demand ischemia in the setting of acute hypoxic respiratory failure. High-sensitivity troponin peaked at 232 and downtrended. No evidence of wall motion abnormalities on 2D echo.   Myocardial injury and hx of CAD:  trop 232 -->  tending down. No CP.  Likely due to demand ischemia. Aspirin, Lipitor   Erythrocytosis:  Hgb stable, 17.1 (17.4 on 06/10/22) Likely d/t smoking   HLD (hyperlipidemia) LDL is at goal 41. Lipitor   HTN (hypertension) Blood pressure is at goal normotensive Coreg, Cozaar  Aneurysm of ascending aorta without rupture (HCC) Follow-up with PCP   Lung nodule: CTA showed a stable solid left lower lobe pulmonary nodule.  f/u with PCP/pulmonary  Hx of DVT (deep venous thrombosis)-right leg:  not taking anticoagulants. Will repeat lower extremity venous Doppler --> negative  CT angio chest negative for pulmonary embolism.   Tobacco abuse and Alcohol abuse: Smoking cessation counseling  provided. Currently smokes 1 pack/day from 2 packs/day.  He states he is done with smoking. Nicotine patch   Alcohol abuse with concern for withdrawal CIWA protocol-- scoring zero. Will discontinue No evidence of alcohol withdrawal at this time Multivitamin, thiamine, folic acid supplement     overall slow improvement. Patient should be able discharge in 1 to 2 days.  Procedures: Family communication : none Consults : pulmonary CODE STATUS: full DVT Prophylaxis : Lovenox Level of care: Progressive Status is: Inpatient Remains inpatient appropriate because: COPD exacerbation.    TOTAL TIME TAKING CARE OF THIS PATIENT: 35 minutes.  >50% time spent on counselling and coordination of care  Note: This dictation was prepared with Dragon dictation along with smaller phrase technology. Any transcriptional errors that result from this process are unintentional.  Enedina Finner M.D    [facility]   CC: Primary care physician; Margarita Mail, DO

## 2022-10-03 NOTE — TOC Progression Note (Signed)
Transition of Care [facility]) - Progression Note    Patient Details  Name: Joshua Schmidt MRN: [id number] Date of Birth: 1950/08/18  Transition of [facility]) CM/SW Contact  Bing Quarry, RN Phone Number: 10/03/2022, 1:49 PM  Clinical Narrative:  6/22: DME home oxygen and nebulizer orders in after ambulation saturation test qualified. Notified [employer]. EDD  expected in 1 to 2 days. Gabriel Cirri RN CM     Expected Discharge Plan: Home/Self Care Barriers to Discharge: Continued Medical Work up  Expected Discharge Plan and Services     Post Acute Care Choice: NA Living arrangements for the past 2 months: Single Family Home                                       Social Determinants of Health (SDOH) Interventions SDOH Screenings   Food Insecurity: No Food Insecurity (09/28/2022)  Housing: Low Risk  (09/28/2022)  Transportation Needs: No Transportation Needs (09/28/2022)  Utilities: Not At Risk (09/28/2022)  Depression (PHQ2-9): Low Risk  (09/28/2022)  Financial Resource Strain: Low Risk  (11/06/2021)  Physical Activity: Sufficiently Active (11/06/2021)  Social Connections: Unknown (11/06/2021)  Stress: No Stress Concern Present (11/06/2021)  Tobacco Use: High Risk (09/29/2022)    Readmission Risk Interventions     No data to display

## 2022-10-03 NOTE — Progress Notes (Signed)
SATURATION QUALIFICATIONS:   Patient Saturations on Room Air at Rest = 88%  Patient Saturations on Room Air while Ambulating = 84%  Patient Saturations on 4 Liters of oxygen while Ambulating = 91%  Please briefly explain why patient needs home oxygen: Desaturation on room air

## 2022-10-03 NOTE — Plan of Care (Signed)
  Problem: Pain Managment: Goal: General experience of comfort will improve Outcome: Progressing   Problem: Safety: Goal: Ability to remain free from injury will improve Outcome: Progressing   Problem: Skin Integrity: Goal: Risk for impaired skin integrity will decrease Outcome: Progressing   

## 2022-10-04 DIAGNOSIS — J9601 Acute respiratory failure with hypoxia: Secondary | ICD-10-CM | POA: Diagnosis not present

## 2022-10-04 NOTE — Progress Notes (Signed)
  Progress Note   Patient: Joshua Schmidt GNF:[id number] DOB: 1950-11-03 DOA: 09/28/2022     6 DOS: the patient was seen and examined on 10/04/2022 at 9:15AM      Brief hospital course: Mr. Juan Quam is a 72 y.o. M with COPD, smoking, dCHF, HTN, HLD, CAD, OSA, aortic aneurysm, alcohol abuse, history of right leg DVT not on anticoagulation who presented with shortness of breath hypoxia.  Found to have COPD flare and CHF flare.     Assessment and Plan: Acute respiratory failure with hypoxia due to COPD exacerbation and CHF exacerbation Weaned to 4L.  Not previously on O2.  Suspect he will need chronic O2 4L.  CTA here ruled out PE, showed no pneumonia.  Echo with preserved EF.  Given wheezing, smoking history, suspect COPD   COPD with exacerbation Patient presented with wheezing, hypoxia.    - Continue Solu-Medrol - Continue Brovana, Pulmicort, Yupelri - Ouptaitent PFTs  Acute on chronic diastolic congestive heart failure Patient was treated with diuretics, now appears euvolemic - Hold diuretics - Continue carvedilol, losartan  Myocardial injury Ischemia ruled out, troponin peaked to 32 in the setting of hypoxic respiratory failure.  No chest pain or EKG changes or wall motion abnormalities on echocardiogram.  Hyperlipidemia Aneurysm of ascending aorta Hypertension Blood pressure controlled -Continue aspirin, atorvastatin, carvedilol, losartan - Follow-up aneurysm with PCP  Lung nodule -Follow-up with PCP  Smoking Cessation recommended, modalities discussed  Alcohol abuse No evidence of withdrawal - Continue thiamine and folate       Subjective: Patient is feeling okay, no fever, no confusion      Physical Exam: BP 103/71 (BP Location: Left Arm)   Pulse 64   Temp 98.3 F (36.8 C)   Resp 16   Ht 6\' 3"  (1.905 m)   Wt 88 kg   SpO2 90%   BMI 24.25 kg/m   Thin adult male, sitting up in bed, eating breakfast RRR, no murmurs, no peripheral edema Respiratory  normal, lung sounds diminished, no rales or wheezes appreciated Abdomen soft no tenderness palpation or guarding Attention normal, affect appropriate, judgment Syprine normal    Data Reviewed: pCO2 78, pH normal pO2 69 Patient metabolic panel several days ago normal CBC showed hemoglobin 17 Echocardiogram showed normal EF BNP is low      Disposition: Status is: Inpatient Patient was admitted with respiratory failure, probably mostly due to COPD exacerbation, possibly also CHF exacerbation  He is now weaned to 4 L, and is stable.  He still desaturates with ambulation so we will need to discharge on home oxygen with PCP follow-up for COPD flare  Oxygen should be delivered tomorrow and he can transition to oral prednisone and discharge tomorrow        Author: Alberteen Sam, MD 10/04/2022 3:42 PM  For on call review [email or web address].

## 2022-10-04 NOTE — TOC Progression Note (Signed)
Transition of Care [facility]) - Progression Note    Patient Details  Name: Joshua Schmidt MRN: [id number] Date of Birth: Aug 26, 1950  [facility]) CM/SW Contact  Bing Quarry, RN Phone Number: 10/04/2022, 4:46 PM  Clinical Narrative: 6/23: DME home oxygen delivery complete and confirmed. Transport tanks delivered to room and condenser and nebulizer ordered delivered to home address this afternoon. 4 transport tanks delivered to room, lasting approx. 2 hours each per [employer]. Expected discharge is now Monday 10/05/22.  Gabriel Cirri RN CM Transitions of Care Department Weekends Only [phone]       Expected Discharge Plan: Home/Self Care Barriers to Discharge: Barriers Resolved  Expected Discharge Plan and Services     Post Acute Care Choice: NA Living arrangements for the past 2 months: Single Family Home                 DME Arranged: Oxygen, Nebulizer machine DME Agency: [employer] Date DME Agency Contacted: 10/03/22 Time DME Agency Contacted: 1430 Representative spoke with at DME Agency: [facility] HH Arranged: NA HH Agency: NA         Social Determinants of Health (SDOH) Interventions SDOH Screenings   Food Insecurity: No Food Insecurity (09/28/2022)  Housing: Low Risk  (09/28/2022)  Transportation Needs: No Transportation Needs (09/28/2022)  Utilities: Not At Risk (09/28/2022)  Depression (PHQ2-9): Low Risk  (09/28/2022)  Financial Resource Strain: Low Risk  (11/06/2021)  Physical Activity: Sufficiently Active (11/06/2021)  Social Connections: Unknown (11/06/2021)  Stress: No Stress Concern Present (11/06/2021)  Tobacco Use: High Risk (09/29/2022)    Readmission Risk Interventions     No data to display

## 2022-10-04 NOTE — Progress Notes (Signed)
Mobility Specialist - Progress Note   Pre-mobility: SpO2(92) During mobility: SpO2(90-91) Post-mobility: SPO2(90)     10/04/22 1700  Mobility  Activity Ambulated with assistance in hallway;Stood at bedside;Dangled on edge of bed  Level of Assistance Independent  Assistive Device None  Distance Ambulated (ft) 1000 ft  Range of Motion/Exercises Active  Activity Response Tolerated well  Mobility Referral Yes  $Mobility charge 1 Mobility  Mobility Specialist Start Time (ACUTE ONLY) 1655  Mobility Specialist Stop Time (ACUTE ONLY) 1730  Mobility Specialist Time Calculation (min) (ACUTE ONLY) 35 min   Pt resting EOB upon entry on 4L. Pt STS and ambulates to hallway to atrium around loop with no AD. Pt remained steadily at 90-91% o2 throughout ambulation session. Pt endorses no pain, SOB or dizziness. Pt left EOB with needs in reach.   Joshua Schmidt Mobility Specialist 10/04/22, 5:37 PM

## 2022-10-05 DIAGNOSIS — E8779 Other fluid overload: Principal | ICD-10-CM

## 2022-10-05 DIAGNOSIS — J441 Chronic obstructive pulmonary disease with (acute) exacerbation: Secondary | ICD-10-CM | POA: Diagnosis not present

## 2022-10-05 DIAGNOSIS — J9601 Acute respiratory failure with hypoxia: Secondary | ICD-10-CM | POA: Diagnosis not present

## 2022-10-05 DIAGNOSIS — I5033 Acute on chronic diastolic (congestive) heart failure: Secondary | ICD-10-CM | POA: Diagnosis not present

## 2022-10-05 DIAGNOSIS — I10 Essential (primary) hypertension: Secondary | ICD-10-CM

## 2022-10-05 MED ORDER — FOLIC ACID 1 MG PO TABS: 1.0000 mg | ORAL_TABLET | Freq: Every day | ORAL | 0 refills | Status: DC

## 2022-10-05 MED ORDER — INCRUSE ELLIPTA 62.5 MCG/ACT IN AEPB: 1.0000 | INHALATION_SPRAY | Freq: Every day | RESPIRATORY_TRACT | 1 refills | Status: DC

## 2022-10-05 MED ORDER — ALBUTEROL SULFATE HFA 108 (90 BASE) MCG/ACT IN AERS: 2.0000 | INHALATION_SPRAY | Freq: Four times a day (QID) | RESPIRATORY_TRACT | 2 refills | Status: DC | PRN

## 2022-10-05 MED ORDER — ADULT MULTIVITAMIN W/MINERALS CH: 1.0000 | ORAL_TABLET | Freq: Every day | ORAL | 0 refills | Status: AC

## 2022-10-05 MED ORDER — DM-GUAIFENESIN ER 30-600 MG PO TB12: 1.0000 | ORAL_TABLET | Freq: Two times a day (BID) | ORAL | 0 refills | Status: DC | PRN

## 2022-10-05 MED ORDER — PREDNISONE 10 MG (21) PO TBPK: ORAL_TABLET | ORAL | 0 refills | Status: DC

## 2022-10-05 NOTE — Care Management Important Message (Signed)
Important Message  Patient Details  Name: Joshua Schmidt MRN: [id number] Date of Birth: Mar 30, 1951   Medicare Important Message Given:  Yes     [name] 10/05/2022, 11:12 AM

## 2022-10-05 NOTE — TOC Transition Note (Signed)
Transition of Care [facility]) - CM/SW Discharge Note   Patient Details  Name: Joshua Schmidt MRN: [id number] Date of Birth: [birth year]  [facility] CM/SW Contact:  Margarito Liner, LCSW Phone Number: 10/05/2022, 11:13 AM   Clinical Narrative:  Patient has orders to discharge home today. No further concerns. CSW signing off.   Final next level of care: Home/Self Care Barriers to Discharge: Barriers Resolved   Patient Goals and CMS Choice      Discharge Placement                  Patient to be transferred to facility by: Self   Patient and family notified of of transfer: 10/05/22  Discharge Plan and Services Additional resources added to the After Visit Summary for       Post Acute Care Choice: NA          DME Arranged: Oxygen, Nebulizer machine DME Agency: [employer] Date DME Agency Contacted: 10/03/22 Time DME Agency Contacted: [id number] Representative spoke with at DME Agency: [facility] HH Arranged: NA HH Agency: NA        Social Determinants of Health (SDOH) Interventions SDOH Screenings   Food Insecurity: No Food Insecurity (09/28/2022)  Housing: Low Risk  (09/28/2022)  Transportation Needs: No Transportation Needs (09/28/2022)  Utilities: Not At Risk (09/28/2022)  Depression (PHQ2-9): Low Risk  (09/28/2022)  Financial Resource Strain: Low Risk  (11/06/2021)  Physical Activity: Sufficiently Active (11/06/2021)  Social Connections: Unknown (11/06/2021)  Stress: No Stress Concern Present (11/06/2021)  Tobacco Use: High Risk (09/29/2022)     Readmission Risk Interventions     No data to display

## 2022-10-05 NOTE — Discharge Summary (Signed)
Physician Discharge Summary   Patient: Joshua Schmidt MRN: [id number] DOB: 11-07-1950  Admit date:     09/28/2022  Discharge date: 10/05/22  Discharge Physician: Arnetha Courser   PCP: Margarita Mail, DO   Recommendations at discharge:  Please obtain CBC and BMP in 1 week Follow-up with primary care provider within a week Patient will need out patient pulmonary evaluation and lung function testing, PCP can arrange. Patient is being discharged on tapering course of steroid, please ensure the completion of course Patient need persistent counseling for smoking and alcohol cessation.  Discharge Diagnoses: Principal Problem:   Acute respiratory failure with hypoxia (HCC) Active Problems:   Acute on chronic diastolic CHF (congestive heart failure) (HCC)   COPD exacerbation (HCC)   Myocardial injury   CAD (coronary artery disease)   Erythrocytosis   HLD (hyperlipidemia)   HTN (hypertension)   Aneurysm of ascending aorta without rupture (HCC)   Lung nodule   DVT (deep venous thrombosis) (HCC)   Tobacco abuse   Alcohol abuse   Volume overload state of heart  Resolved Problems:   * No resolved hospital problems. [facility] Course: Joshua Schmidt is a 72 y.o. M with COPD, smoking, dCHF, HTN, HLD, CAD, OSA, aortic aneurysm, alcohol abuse, history of right leg DVT not on anticoagulation who presented with shortness of breath hypoxia.  Found to have COPD flare and CHF flare.   Patient was treated with Solu-Medrol, Brovana, Pulmicort and Yupelri for concern of COPD exacerbation.  Remained on oxygen and is being discharged on 4 L of oxygen.  Concern of significant underlying COPD and he will need outpatient pulmonary evaluation at [facility] trend testing. He is being discharged on Spiriva and albuterol as needed.  Patient was treated with IV diuresis for concern of acute on chronic diastolic heart failure, clinically euvolemic and will continue home medications.  Patient was also found to  have mildly positive troponin which peaked at 32, likely demand ischemia.  No ACS.  Echocardiogram with no wall motion abnormalities.  Patient will continue on current medications and need to have a close follow-up with his providers for further recommendations.  Consultants: Pulmonology Procedures performed: None Disposition: Home Diet recommendation:  Discharge Diet Orders (From admission, onward)     Start     Ordered   10/05/22 0000  Diet - low sodium heart healthy        10/05/22 1100           Cardiac diet DISCHARGE MEDICATION: Allergies as of 10/05/2022   No Known Allergies      Medication List     STOP taking these medications    hydrochlorothiazide 25 MG tablet Commonly known as: HYDRODIURIL       TAKE these medications    albuterol 108 (90 Base) MCG/ACT inhaler Commonly known as: VENTOLIN HFA Inhale 2 puffs into the lungs every 6 (six) hours as needed for wheezing or shortness of breath.   aspirin 81 MG chewable tablet Chew 81 mg by mouth daily.   atorvastatin 20 MG tablet Commonly known as: LIPITOR TAKE 1 TABLET BY MOUTH DAILY   carvedilol 12.5 MG tablet Commonly known as: COREG Take 1 tablet (12.5 mg total) by mouth 2 (two) times daily.   dextromethorphan-guaiFENesin 30-600 MG 12hr tablet Commonly known as: MUCINEX DM Take 1 tablet by mouth 2 (two) times daily as needed for cough.   fluticasone 50 MCG/ACT nasal spray Commonly known as: FLONASE Place 2 sprays into both nostrils daily.  folic acid 1 MG tablet Commonly known as: FOLVITE Take 1 tablet (1 mg total) by mouth daily. Start taking on: October 06, 2022   Incruse Ellipta 62.5 MCG/ACT Aepb Generic drug: umeclidinium bromide Inhale 1 puff into the lungs daily.   losartan 50 MG tablet Commonly known as: COZAAR Take 1 tablet (50 mg total) by mouth daily.   multivitamin with minerals Tabs tablet Take 1 tablet by mouth daily. Start taking on: October 06, 2022   predniSONE 10 MG (21)  Tbpk tablet Commonly known as: STERAPRED UNI-PAK 21 TAB Take 4 tablet tomorrow, decrease 1 tablet every other day until you finish all the course               Durable Medical Equipment  (From admission, onward)           Start     Ordered   10/03/22 1313  For home use only DME oxygen  Once       Question Answer Comment  Length of Need Lifetime   Mode or (Route) Nasal cannula   Liters per Minute 4   Frequency Continuous (stationary and portable oxygen unit needed)   Oxygen conserving device Yes   Oxygen delivery system Gas      10/03/22 1313   10/03/22 1313  For home use only DME Nebulizer machine  Once       Question Answer Comment  Patient needs a nebulizer to treat with the following condition COPD (chronic obstructive pulmonary disease) (HCC)   Length of Need Lifetime      10/03/22 1313            Follow-up Information     Margarita Mail, DO. Schedule an appointment as soon as possible for a visit in 1 week(s).   Specialty: Internal Medicine Contact information: [address] [place] [address] [phone]                Discharge Exam: Ceasar Mons Weights   09/30/22 1056 10/01/22 0500 10/03/22 0853  Weight: 89.9 kg 90.3 kg 88 kg   General.  Well-developed gentleman, in no acute distress. Pulmonary.  Lungs clear bilaterally, normal respiratory effort. CV.  Regular rate and rhythm, no JVD, rub or murmur. Abdomen.  Soft, nontender, nondistended, BS positive. CNS.  Alert and oriented .  No focal neurologic deficit. Extremities.  No edema, no cyanosis, pulses intact and symmetrical. Psychiatry.  Judgment and insight appears normal.   Condition at discharge: stable  The results of significant diagnostics from this hospitalization (including imaging, microbiology, ancillary and laboratory) are listed below for reference.   Imaging Studies: ECHOCARDIOGRAM LIMITED  Result Date: 10/02/2022    ECHOCARDIOGRAM LIMITED REPORT    Patient Name:   Joshua Schmidt Date of Exam: 10/02/2022 Medical Rec #:  [id number]       Height:       75.0 in Accession #:    [id number]      Weight:       199.1 lb Date of Birth:  01-26-1951       BSA:          2.190 m Patient Age:    71 years        BP:           117/80 mmHg Patient Gender: M               HR:           67 bpm. Exam Location:  [facility] Procedure:  Limited Echo, Color Doppler and Cardiac Doppler Indications:     Dyspnea R06.00                  Elevated Troponin  History:         Patient has prior history of Echocardiogram examinations, most                  recent 10/01/2022. Previous Myocardial Infarction; Risk                  Factors:Hypertension and Dyslipidemia.  Sonographer:     Cristela Blue Referring Phys:  [id number] Oliver Pila HALL Diagnosing Phys: Julien Nordmann MD IMPRESSIONS  1. Left ventricular ejection fraction, by estimation, is 60 to 65%. The left ventricle has normal function. The left ventricle has no regional wall motion abnormalities. There is mild left ventricular hypertrophy.  2. Right ventricular systolic function is normal. The right ventricular size is normal. Tricuspid regurgitation signal is inadequate for assessing PA pressure.  3. The mitral valve is normal in structure. No evidence of mitral valve regurgitation. No evidence of mitral stenosis.  4. The aortic valve is tricuspid. Aortic valve regurgitation is not visualized. Aortic valve sclerosis is present, with no evidence of aortic valve stenosis.  5. The inferior vena cava is normal in size with greater than 50% respiratory variability, suggesting right atrial pressure of 3 mmHg. FINDINGS  Left Ventricle: Left ventricular ejection fraction, by estimation, is 60 to 65%. The left ventricle has normal function. The left ventricle has no regional wall motion abnormalities. The left ventricular internal cavity size was normal in size. There is  mild left ventricular hypertrophy. Right Ventricle: The right ventricular size is normal.  No increase in right ventricular wall thickness. Right ventricular systolic function is normal. Tricuspid regurgitation signal is inadequate for assessing PA pressure. Left Atrium: Left atrial size was normal in size. Right Atrium: Right atrial size was normal in size. Pericardium: There is no evidence of pericardial effusion. Mitral Valve: The mitral valve is normal in structure. Mild mitral annular calcification. No evidence of mitral valve stenosis. Tricuspid Valve: The tricuspid valve is normal in structure. Tricuspid valve regurgitation is not demonstrated. No evidence of tricuspid stenosis. Aortic Valve: The aortic valve is tricuspid. Aortic valve regurgitation is not visualized. Aortic valve sclerosis is present, with no evidence of aortic valve stenosis. Pulmonic Valve: The pulmonic valve was normal in structure. Pulmonic valve regurgitation is not visualized. No evidence of pulmonic stenosis. Aorta: The aortic root is normal in size and structure. Venous: The inferior vena cava is normal in size with greater than 50% respiratory variability, suggesting right atrial pressure of 3 mmHg. IAS/Shunts: No atrial level shunt detected by color flow Doppler. Additional Comments: Spectral Doppler performed. Color Doppler performed.  LEFT VENTRICLE PLAX 2D LVIDd:         5.40 cm LVIDs:         3.40 cm LV PW:         1.30 cm LV IVS:        1.20 cm LVOT diam:     2.10 cm LVOT Area:     3.46 cm  RIGHT VENTRICLE RV Basal diam:  4.90 cm RV Mid diam:    4.10 cm LEFT ATRIUM           Index        RIGHT ATRIUM           Index LA diam:      3.40  cm 1.55 cm/m   RA Area:     18.10 cm LA Vol (A2C): 51.4 ml 23.47 ml/m  RA Volume:   49.90 ml  22.78 ml/m LA Vol (A4C): 75.2 ml 34.34 ml/m   AORTA Ao Root diam: 4.00 cm  SHUNTS Systemic Diam: 2.10 cm Julien Nordmann MD Electronically signed by Julien Nordmann MD Signature Date/Time: 10/02/2022/4:48:19 PM    Final    ECHOCARDIOGRAM COMPLETE  Result Date: 10/01/2022     ECHOCARDIOGRAM REPORT   Patient Name:   Joshua Schmidt Date of Exam: 10/01/2022 Medical Rec #:  [id number]       Height:       75.0 in Accession #:    [id number]      Weight:       199.1 lb Date of Birth:  January 04, 1951       BSA:          2.190 m Patient Age:    71 years        BP:           110/67 mmHg Patient Gender: M               HR:           66 bpm. Exam Location:  [facility] Procedure: 2D Echo, Cardiac Doppler and Color Doppler Indications:     CHF  History:         Patient has prior history of Echocardiogram examinations, most                  recent 04/15/2022. CHF, CAD, COPD, Signs/Symptoms:Shortness of                  Breath; Risk Factors:Hypertension, Sleep Apnea, Dyslipidemia                  and Current Smoker.  Sonographer:     Mikki Harbor Referring Phys:  [id number] Sunnie Nielsen Diagnosing Phys: Julien Nordmann MD  Sonographer Comments: Technically difficult study due to poor echo windows. Image acquisition challenging due to COPD. IMPRESSIONS  1. Left ventricular ejection fraction, by estimation, is 60 to 65%. The left ventricle has normal function. The left ventricle has no regional wall motion abnormalities. There is moderate left ventricular hypertrophy. Left ventricular diastolic parameters are consistent with Grade I diastolic dysfunction (impaired relaxation).  2. Right ventricular systolic function is mildly reduced. The right ventricular size is moderately enlarged. There is normal pulmonary artery systolic pressure. The estimated right ventricular systolic pressure is 21.8 mmHg.  3. Left atrial size was mild to moderately dilated.  4. Right atrial size was moderately dilated.  5. The mitral valve is normal in structure. Mild mitral valve regurgitation. No evidence of mitral stenosis.  6. The aortic valve is tricuspid. Aortic valve regurgitation is not visualized. Aortic valve sclerosis is present, with no evidence of aortic valve stenosis.  7. There is mild dilatation of the aortic root,  measuring 44 mm. There is moderate dilatation of the ascending aorta, measuring 47 mm.  8. The inferior vena cava is normal in size with greater than 50% respiratory variability, suggesting right atrial pressure of 3 mmHg. FINDINGS  Left Ventricle: Left ventricular ejection fraction, by estimation, is 60 to 65%. The left ventricle has normal function. The left ventricle has no regional wall motion abnormalities. The left ventricular internal cavity size was normal in size. There is  moderate left ventricular hypertrophy. Left ventricular diastolic parameters are consistent with Grade I diastolic dysfunction (  impaired relaxation). Right Ventricle: The right ventricular size is moderately enlarged. No increase in right ventricular wall thickness. Right ventricular systolic function is mildly reduced. There is normal pulmonary artery systolic pressure. The tricuspid regurgitant velocity is 1.86 m/s, and with an assumed right atrial pressure of 8 mmHg, the estimated right ventricular systolic pressure is 21.8 mmHg. Left Atrium: Left atrial size was mild to moderately dilated. Right Atrium: Right atrial size was moderately dilated. Pericardium: There is no evidence of pericardial effusion. Mitral Valve: The mitral valve is normal in structure. Mild mitral annular calcification. Mild mitral valve regurgitation. No evidence of mitral valve stenosis. MV peak gradient, 3.8 mmHg. The mean mitral valve gradient is 1.0 mmHg. Tricuspid Valve: The tricuspid valve is normal in structure. Tricuspid valve regurgitation is mild . No evidence of tricuspid stenosis. Aortic Valve: The aortic valve is tricuspid. Aortic valve regurgitation is not visualized. Aortic valve sclerosis is present, with no evidence of aortic valve stenosis. Aortic valve mean gradient measures 4.0 mmHg. Aortic valve peak gradient measures 8.8  mmHg. Aortic valve area, by VTI measures 4.11 cm. Pulmonic Valve: The pulmonic valve was normal in structure. Pulmonic  valve regurgitation is not visualized. No evidence of pulmonic stenosis. Aorta: The aortic root is normal in size and structure. There is mild dilatation of the aortic root, measuring 44 mm. There is moderate dilatation of the ascending aorta, measuring 47 mm. Venous: The inferior vena cava is normal in size with greater than 50% respiratory variability, suggesting right atrial pressure of 3 mmHg. IAS/Shunts: No atrial level shunt detected by color flow Doppler.  LEFT VENTRICLE PLAX 2D LVIDd:         5.00 cm   Diastology LVIDs:         2.40 cm   LV e' medial:    7.40 cm/s LV PW:         1.50 cm   LV E/e' medial:  8.3 LV IVS:        1.60 cm   LV e' lateral:   7.62 cm/s LVOT diam:     2.20 cm   LV E/e' lateral: 8.0 LV SV:         113 LV SV Index:   52 LVOT Area:     3.80 cm  RIGHT VENTRICLE RV Basal diam:  4.55 cm RV Mid diam:    5.40 cm RV S prime:     18.00 cm/s TAPSE (M-mode): 3.6 cm LEFT ATRIUM              Index        RIGHT ATRIUM           Index LA diam:        5.00 cm  2.28 cm/m   RA Area:     33.00 cm LA Vol (A2C):   102.0 ml 46.57 ml/m  RA Volume:   132.00 ml 60.27 ml/m LA Vol (A4C):   67.4 ml  30.78 ml/m LA Biplane Vol: 83.8 ml  38.26 ml/m  AORTIC VALVE                    PULMONIC VALVE AV Area (Vmax):    3.52 cm     PV Vmax:       1.37 m/s AV Area (Vmean):   3.38 cm     PV Peak grad:  7.5 mmHg AV Area (VTI):     4.11 cm AV Vmax:  148.00 cm/s AV Vmean:          98.100 cm/s AV VTI:            0.275 m AV Peak Grad:      8.8 mmHg AV Mean Grad:      4.0 mmHg LVOT Vmax:         137.00 cm/s LVOT Vmean:        87.100 cm/s LVOT VTI:          0.297 m LVOT/AV VTI ratio: 1.08  AORTA Ao Root diam: 4.40 cm Ao Asc diam:  4.60 cm MITRAL VALVE               TRICUSPID VALVE MV Area (PHT): 2.19 cm    TR Peak grad:   13.8 mmHg MV Area VTI:   3.85 cm    TR Vmax:        186.00 cm/s MV Peak grad:  3.8 mmHg MV Mean grad:  1.0 mmHg    SHUNTS MV Vmax:       0.98 m/s    Systemic VTI:  0.30 m MV Vmean:      46.5  cm/s   Systemic Diam: 2.20 cm MV Decel Time: 346 msec MV E velocity: 61.30 cm/s MV A velocity: 85.90 cm/s MV E/A ratio:  0.71 Julien Nordmann MD Electronically signed by Julien Nordmann MD Signature Date/Time: 10/01/2022/4:55:11 PM    Final    US Venous Img Lower Bilateral (DVT)  Result Date: 09/29/2022 CLINICAL DATA:  Lower extremity edema EXAM: BILATERAL LOWER EXTREMITY VENOUS DOPPLER ULTRASOUND TECHNIQUE: Gray-scale sonography with graded compression, as well as color Doppler and duplex ultrasound were performed to evaluate the lower extremity deep venous systems from the level of the common femoral vein and including the common femoral, femoral, profunda femoral, popliteal and calf veins including the posterior tibial, peroneal and gastrocnemius veins when visible. The superficial great saphenous vein was also interrogated. Spectral Doppler was utilized to evaluate flow at rest and with distal augmentation maneuvers in the common femoral, femoral and popliteal veins. COMPARISON:  None Available. FINDINGS: RIGHT LOWER EXTREMITY Common Femoral Vein: No evidence of thrombus. Normal compressibility, respiratory phasicity and response to augmentation. Saphenofemoral Junction: No evidence of thrombus. Normal compressibility and flow on color Doppler imaging. Profunda Femoral Vein: No evidence of thrombus. Normal compressibility and flow on color Doppler imaging. Femoral Vein: No evidence of thrombus. Normal compressibility, respiratory phasicity and response to augmentation. Popliteal Vein: No evidence of thrombus. Normal compressibility, respiratory phasicity and response to augmentation. Calf Veins: No evidence of thrombus. Normal compressibility and flow on color Doppler imaging. Superficial Great Saphenous Vein: No evidence of thrombus. Normal compressibility. Venous Reflux:  None. Other Findings:  None. LEFT LOWER EXTREMITY Common Femoral Vein: No evidence of thrombus. Normal compressibility, respiratory  phasicity and response to augmentation. Saphenofemoral Junction: No evidence of thrombus. Normal compressibility and flow on color Doppler imaging. Profunda Femoral Vein: No evidence of thrombus. Normal compressibility and flow on color Doppler imaging. Femoral Vein: No evidence of thrombus. Normal compressibility, respiratory phasicity and response to augmentation. Popliteal Vein: No evidence of thrombus. Normal compressibility, respiratory phasicity and response to augmentation. Calf Veins: No evidence of thrombus. Normal compressibility and flow on color Doppler imaging. Superficial Great Saphenous Vein: No evidence of thrombus. Normal compressibility. Venous Reflux:  None. Other Findings:  None. IMPRESSION: No evidence of deep venous thrombosis in either lower extremity. Electronically Signed   By: Malachy Moan M.D.   On: 09/29/2022 11:05  CT Angio Chest PE W and/or Wo Contrast  Result Date: 09/28/2022 CLINICAL DATA:  Pulmonary embolus suspected EXAM: CT ANGIOGRAPHY CHEST WITH CONTRAST TECHNIQUE: Multidetector CT imaging of the chest was performed using the standard protocol during bolus administration of intravenous contrast. Multiplanar CT image reconstructions and MIPs were obtained to evaluate the vascular anatomy. RADIATION DOSE REDUCTION: This exam was performed according to the departmental dose-optimization program which includes automated exposure control, adjustment of the mA and/or kV according to patient size and/or use of iterative reconstruction technique. CONTRAST:  75mL OMNIPAQUE IOHEXOL 350 MG/ML SOLN COMPARISON:  Lung cancer screening chest CT dated February 03, 2022 FINDINGS: Cardiovascular: No evidence of pulmonary embolus. Cardiomegaly. No pericardial effusion. Dilated ascending thoracic aorta, measuring up to 4.5 cm, unchanged when compared with the prior. Mild atherosclerotic disease of the thoracic aorta. Severe left main and three-vessel coronary artery calcifications. Dilated  main pulmonary artery, measuring up to 4.3 cm. Mediastinum/Nodes: Esophagus and thyroid are unremarkable. No enlarged lymph nodes seen in the chest. Lungs/Pleura: Central airways are patent. Bibasilar atelectasis. No consolidation, pleural effusion or pneumothorax. Solid pulmonary nodule of the left lower lobe measuring 5 mm series 6, image 129, unchanged when compared with the prior exam. Upper Abdomen: Unchanged bilateral adrenal adenomas, no specific follow-up imaging is necessary. No acute abnormality. Musculoskeletal: No chest wall abnormality. No acute or significant osseous findings. Review of the MIP images confirms the above findings. IMPRESSION: 1. No evidence of pulmonary embolus. 2. Dilated ascending thoracic aorta, measuring up to 4.5 cm, unchanged when compared with the prior exam. Ascending thoracic aortic aneurysm. Recommend semi-annual imaging followup by CTA or MRA and referral to cardiothoracic surgery if not already obtained. This recommendation follows 2010 ACCF/AHA/AATS/ACR/ASA/SCA/SCAI/SIR/STS/SVM Guidelines for the Diagnosis and Management of Patients With Thoracic Aortic Disease. Circulation. 2010; 121: Z610-R604. Aortic aneurysm NOS (ICD10-I71.9) 3. Stable solid left lower lobe pulmonary nodule. Recommend attention on annual lung cancer screening CT. 4. Dilated main pulmonary artery, findings can be seen in the setting of pulmonary arterial hypertension. 5. Coronary artery calcifications and aortic Atherosclerosis (ICD10-I70.0). Electronically Signed   By: Allegra Lai M.D.   On: 09/28/2022 15:00   DG Chest 2 View  Result Date: 09/28/2022 CLINICAL DATA:  SOB EXAM: CHEST - 2 VIEW COMPARISON:  CXR 03/06/05 FINDINGS: No pleural effusion. No pneumothorax. Enlarged cardiac contours. Prominent bilateral interstitial opacities could represent pulmonary venous congestion. Bibasilar atelectasis. No radiographically apparent displaced rib fractures. Visualized upper abdomen is unremarkable.  IMPRESSION: Enlarged cardiac contours with prominent bilateral interstitial opacities, which could represent pulmonary venous congestion. Electronically Signed   By: Lorenza Cambridge M.D.   On: 09/28/2022 13:10    Microbiology: Results for orders placed or performed during the hospital encounter of 09/28/22  Resp Panel by RT-PCR (Flu A&B, Covid) Anterior Nasal Swab     Status: None   Collection Time: 09/28/22  1:57 PM   Specimen: Anterior Nasal Swab  Result Value Ref Range Status   SARS Coronavirus 2 by RT PCR NEGATIVE NEGATIVE Final    Comment: (NOTE) SARS-CoV-2 target nucleic acids are NOT DETECTED.  The SARS-CoV-2 RNA is generally detectable in upper respiratory specimens during the acute phase of infection. The lowest concentration of SARS-CoV-2 viral copies this assay can detect is 138 copies/mL. A negative result does not preclude SARS-Cov-2 infection and should not be used as the sole basis for treatment or other patient management decisions. A negative result may occur with  improper specimen collection/handling, submission of specimen other than nasopharyngeal swab,  presence of viral mutation(s) within the areas targeted by this assay, and inadequate number of viral copies(<138 copies/mL). A negative result must be combined with clinical observations, patient history, and epidemiological information. The expected result is Negative.  Fact Sheet for Patients:  BloggerCourse.com  Fact Sheet for Healthcare Providers:  SeriousBroker.it  This test is no t yet approved or cleared by the [place] FDA and  has been authorized for detection and/or diagnosis of SARS-CoV-2 by FDA under an Emergency Use Authorization (EUA). This EUA will remain  in effect (meaning this test can be used) for the duration of the COVID-19 declaration under Section 564(b)(1) of the Act, 21 U.S.C.section 360bbb-3(b)(1), unless the authorization is  terminated  or revoked sooner.       Influenza A by PCR NEGATIVE NEGATIVE Final   Influenza B by PCR NEGATIVE NEGATIVE Final    Comment: (NOTE) The Xpert Xpress SARS-CoV-2/FLU/RSV plus assay is intended as an aid in the diagnosis of influenza from Nasopharyngeal swab specimens and should not be used as a sole basis for treatment. Nasal washings and aspirates are unacceptable for Xpert Xpress SARS-CoV-2/FLU/RSV testing.  Fact Sheet for Patients: BloggerCourse.com  Fact Sheet for Healthcare Providers: SeriousBroker.it  This test is not yet approved or cleared by the [place] FDA and has been authorized for detection and/or diagnosis of SARS-CoV-2 by FDA under an Emergency Use Authorization (EUA). This EUA will remain in effect (meaning this test can be used) for the duration of the COVID-19 declaration under Section 564(b)(1) of the Act, 21 U.S.C. section 360bbb-3(b)(1), unless the authorization is terminated or revoked.  Performed at [facility], [address] [place], [place] [address]   Expectorated Sputum Assessment w Gram Stain, Rflx to Resp Cult     Status: None   Collection Time: 09/30/22  5:50 PM   Specimen: SPU  Result Value Ref Range Status   Specimen Description SPUTUM  Final   Special Requests EXPSU  Final   Sputum evaluation   Final    Sputum specimen not acceptable for testing.  Please recollect.   CALLED TO SAMER HARBAWI 09/30/22 1833 KLW Performed at [facility], [address] [place], [place] [address]    Report Status 09/30/2022 FINAL  Final    Labs: CBC: Recent Labs  Lab 09/28/22 1130 09/29/22 0507 10/02/22 0540  WBC 8.5 5.0 11.5*  HGB 17.1* 17.0 17.3*  HCT 53.6* 53.9* 56.1*  MCV 92.7 92.5 93.2  PLT 221 199 215   Basic Metabolic Panel: Recent Labs  Lab 09/28/22 1130 09/28/22 1359 09/29/22 0507 09/30/22 0346 10/01/22 0436  NA 131*  --  131* 136 137  K 4.3   --  3.7 3.5 3.9  CL 90*  --  87* 90* 85*  CO2 33*  --  35* 37* 41*  GLUCOSE 124*  --  124* 123* 123*  BUN 14  --  13 19 21   CREATININE 0.68  --  0.64 0.69 0.83  CALCIUM 8.9  --  8.4* 8.2* 8.4*  MG  --  1.8  --   --   --    Liver Function Tests: No results for input(s): "AST", "ALT", "ALKPHOS", "BILITOT", "PROT", "ALBUMIN" in the last 168 hours. CBG: No results for input(s): "GLUCAP" in the last 168 hours.  Discharge time spent: greater than 30 minutes.  This record has been created using Conservation officer, historic buildings. Errors have been sought and corrected,but may not always be located. Such creation errors do not  reflect on the standard of care.   Signed: Arnetha Courser, MD [facility] 10/05/2022

## 2022-10-06 ENCOUNTER — Telehealth: Payer: Self-pay | Admitting: *Deleted

## 2022-10-06 NOTE — Transitions of Care (Post Inpatient/ED Visit) (Signed)
   10/06/2022  Name: Joshua Schmidt MRN: [id number] DOB: May 15, 1950  Today's TOC FU Call Status: Today's TOC FU Call Status:: Unsuccessul Call (1st Attempt) Unsuccessful Call (1st Attempt) Date: 10/06/22  Attempted to reach the patient regarding the most recent Inpatient/ED visit.  Follow Up Plan: Additional outreach attempts will be made to reach the patient to complete the Transitions of Care (Post Inpatient/ED visit) call.   Gean Maidens BSN RN [facility] [phone]

## 2022-10-07 ENCOUNTER — Telehealth: Payer: Self-pay | Admitting: *Deleted

## 2022-10-07 NOTE — Transitions of Care (Post Inpatient/ED Visit) (Signed)
   10/07/2022  Name: Joshua Schmidt MRN: [id number] DOB: [birth year]  Today's TOC FU Call Status: Today's TOC FU Call Status:: Unsuccessful Call (2nd Attempt) Unsuccessful Call (2nd Attempt) Date: 10/07/22  Attempted to reach the patient regarding the most recent Inpatient/ED visit.  Follow Up Plan: Additional outreach attempts will be made to reach the patient to complete the Transitions of Care (Post Inpatient/ED visit) call.   Gean Maidens BSN RN [facility] [phone]

## 2022-10-08 ENCOUNTER — Telehealth: Payer: Self-pay | Admitting: *Deleted

## 2022-10-08 NOTE — Transitions of Care (Post Inpatient/ED Visit) (Signed)
10/08/2022  Name: Joshua Schmidt MRN: [id number] DOB: 1951/01/29  Today's TOC FU Call Status: Today's TOC FU Call Status:: Successful TOC FU Call Competed TOC FU Call Complete Date: 10/08/22  Transition Care Management Follow-up Telephone Call Date of Discharge: 10/05/22 Discharge Facility: [facility]) Primary Inpatient Discharge Diagnosis:: Acute respiratory failure with hypoxia How have you been since you were released from the hospital?: Better Any questions or concerns?: Yes Patient Questions/Concerns:: Patient had questions regarding the nebulizer that he received because he didn't feel he needed it Patient Questions/Concerns Addressed: Other: (Patient was going to talk with the company and let them know)  Items Reviewed: Did you receive and understand the discharge instructions provided?: Yes Medications obtained,verified, and reconciled?: Yes (Medications Reviewed) Any new allergies since your discharge?: No Dietary orders reviewed?: No Do you have support at home?: Yes People in Home: spouse Name of Support/Comfort Primary Source: cheryl  Medications Reviewed Today: Medications Reviewed Today     Reviewed by Nelia Shi, CPhT (Pharmacy Technician) on 09/28/22 at 1232  Med List Status: Complete   Medication Order Taking? Sig Documenting Provider Last Dose Status Informant  aspirin 81 MG chewable tablet [id number] Yes Chew 81 mg by mouth daily. [provider] 09/28/2022 Active Pharmacy Records, Multiple Informants, Other  atorvastatin (LIPITOR) 20 MG tablet [id number] Yes TAKE 1 TABLET BY MOUTH DAILY Margarita Mail, DO 09/28/2022 Active Pharmacy Records, Multiple Informants, Other  carvedilol (COREG) 12.5 MG tablet [id number] Yes Take 1 tablet (12.5 mg total) by mouth 2 (two) times daily. Iran Ouch, MD 09/28/2022 Active Pharmacy Records, Multiple Informants, Other  fluticasone (FLONASE) 50 MCG/ACT nasal spray [id number] Yes  Place 2 sprays into both nostrils daily. Margarita Mail, DO 09/28/2022 Active Pharmacy Records, Multiple Informants, Other  hydrochlorothiazide (HYDRODIURIL) 25 MG tablet [id number] Yes TAKE 1 TABLET BY MOUTH DAILY Corky Downs, MD 09/28/2022 Active Pharmacy Records, Multiple Informants, Other  losartan (COZAAR) 50 MG tablet [id number] Yes Take 1 tablet (50 mg total) by mouth daily. Margarita Mail, DO 09/28/2022 Active Pharmacy Records, Multiple Informants, Other            Home Care and Equipment/Supplies: Were Home Health Services Ordered?: No Any new equipment or medical supplies ordered?: Yes Name of Medical supply agency?: [facility] Were you able to get the equipment/medical supplies?: Yes Do you have any questions related to the use of the equipment/supplies?: No  Functional Questionnaire: Do you need assistance with bathing/showering or dressing?: No Do you need assistance with meal preparation?: No Do you need assistance with eating?: No Do you have difficulty maintaining continence: No Do you have difficulty managing or taking your medications?: No  Follow up appointments reviewed: PCP Follow-up appointment confirmed?: Yes Date of PCP follow-up appointment?: 10/16/22 Follow-up Provider: Francesca Oman Specialist [facility] Follow-up appointment confirmed?: Yes Date of Specialist follow-up appointment?: 10/05/22 Follow-Up Specialty Provider:: Tammy Parrett Do you need transportation to your follow-up appointment?: No Do you understand care options if your condition(s) worsen?: No-reviewed care options from [facility]  SDOH Interventions Today    Flowsheet Row Most Recent Value  SDOH Interventions   Food Insecurity Interventions Intervention Not Indicated  Housing Interventions Intervention Not Indicated  Transportation Interventions Intervention Not Indicated      Interventions Today    Flowsheet Row Most Recent Value  General Interventions   General Interventions  Discussed/Reviewed General Interventions Discussed, General Interventions Reviewed, Doctor Visits  Doctor Visits Discussed/Reviewed Doctor Visits Discussed, Doctor Visits Reviewed  Pharmacy Interventions   Pharmacy  Dicussed/Reviewed Pharmacy Topics Discussed  [patient got all his medications]      TOC Interventions Today    Flowsheet Row Most Recent Value  TOC Interventions   TOC Interventions Discussed/Reviewed TOC Interventions Discussed, TOC Interventions Reviewed        Gean Maidens BSN RN [facility] [phone]

## 2022-10-09 ENCOUNTER — Inpatient Hospital Stay: Payer: 59

## 2022-10-09 ENCOUNTER — Inpatient Hospital Stay: Payer: 59 | Attending: Oncology

## 2022-10-09 VITALS — BP 128/77 | HR 74 | Temp 99.5°F

## 2022-10-09 DIAGNOSIS — D751 Secondary polycythemia: Secondary | ICD-10-CM | POA: Insufficient documentation

## 2022-10-09 DIAGNOSIS — F17210 Nicotine dependence, cigarettes, uncomplicated: Secondary | ICD-10-CM | POA: Insufficient documentation

## 2022-10-09 LAB — HEMOGLOBIN AND HEMATOCRIT (CANCER CENTER ONLY)
HCT: 50.9 % (ref 39.0–52.0)
Hemoglobin: 15.3 g/dL (ref 13.0–17.0)

## 2022-10-16 ENCOUNTER — Ambulatory Visit (INDEPENDENT_AMBULATORY_CARE_PROVIDER_SITE_OTHER): Payer: 59

## 2022-10-16 VITALS — BP 130/86 | Ht 75.0 in | Wt 214.0 lb

## 2022-10-16 DIAGNOSIS — Z0000 Encounter for general adult medical examination without abnormal findings: Secondary | ICD-10-CM | POA: Diagnosis not present

## 2022-10-16 NOTE — Progress Notes (Signed)
Subjective:   Joshua Schmidt is a 72 y.o. male who presents for Medicare Annual/Subsequent preventive examination.  Visit Complete: In person  Patient Medicare AWV questionnaire was completed by the patient on (not done); I have confirmed that all information answered by patient is correct and no changes since this date.  Review of Systems     Cardiac Risk Factors include: advanced age (>58men, >78 women);dyslipidemia;hypertension;male gender     Objective:    Today's Vitals   10/16/22 1420 10/16/22 1431  BP: 130/86   Weight: 214 lb (97.1 kg)   Height: 6\' 3"  (1.905 m)   PainSc:  0-No pain   Body mass index is 26.75 kg/m.     10/16/2022    2:57 PM 09/28/2022    6:51 PM 09/28/2022    3:01 PM 03/19/2022   11:19 AM 01/22/2022    2:38 PM 01/22/2022    2:36 PM 11/06/2021    9:04 AM  Advanced Directives  Does Patient Have a Medical Advance Directive? Yes Yes Yes Yes Yes Yes Yes  Type of Estate agent of [place];Living will Healthcare Power of [place];Living will Living will;Out of facility DNR (pink MOST or yellow form) Healthcare Power of [place];Living will Living will;Healthcare Power of Attorney  Living will;Healthcare Power of Attorney  Does patient want to make changes to medical advance directive?  No - Patient declined       Copy of Healthcare Power of Attorney in Chart?  No - copy requested   No - copy requested  No - copy requested    Current Medications (verified) Outpatient Encounter Medications as of 10/16/2022  Medication Sig   albuterol (VENTOLIN HFA) 108 (90 Base) MCG/ACT inhaler Inhale 2 puffs into the lungs every 6 (six) hours as needed for wheezing or shortness of breath.   aspirin 81 MG chewable tablet Chew 81 mg by mouth daily.   atorvastatin (LIPITOR) 20 MG tablet TAKE 1 TABLET BY MOUTH DAILY   carvedilol (COREG) 12.5 MG tablet Take 1 tablet (12.5 mg total) by mouth 2 (two) times daily.   fluticasone (FLONASE) 50 MCG/ACT nasal spray  Place 2 sprays into both nostrils daily.   folic acid (FOLVITE) 1 MG tablet Take 1 tablet (1 mg total) by mouth daily.   hydrochlorothiazide (HYDRODIURIL) 25 MG tablet Take 25 mg by mouth daily.   lisinopril (ZESTRIL) 20 MG tablet Take 20 mg by mouth daily.   losartan (COZAAR) 50 MG tablet Take 1 tablet (50 mg total) by mouth daily.   metoprolol tartrate (LOPRESSOR) 25 MG tablet Take 25 mg by mouth 2 (two) times daily.   Multiple Vitamin (MULTIVITAMIN WITH MINERALS) TABS tablet Take 1 tablet by mouth daily.   umeclidinium bromide (INCRUSE ELLIPTA) 62.5 MCG/ACT AEPB Inhale 1 puff into the lungs daily.   dextromethorphan-guaiFENesin (MUCINEX DM) 30-600 MG 12hr tablet Take 1 tablet by mouth 2 (two) times daily as needed for cough. (Patient not taking: Reported on 10/16/2022)   predniSONE (STERAPRED UNI-PAK 21 TAB) 10 MG (21) TBPK tablet Take 4 tablet tomorrow, decrease 1 tablet every other day until you finish all the course (Patient not taking: Reported on 10/16/2022)   No facility-administered encounter medications on file as of 10/16/2022.    Allergies (verified) Patient has no known allergies.   History: Past Medical History:  Diagnosis Date   Aneurysm (HCC)    Erythrocytosis 12/01/2018   Hyperlipidemia    Hypertension    Myocardial infarction [facility])    Past Surgical History:  Procedure Laterality Date   APPENDECTOMY     COLONOSCOPY WITH PROPOFOL N/A 11/20/2014   Procedure: COLONOSCOPY WITH PROPOFOL;  Surgeon: Kieth Brightly, MD;  Location: [facility] ENDOSCOPY;  Service: Endoscopy;  Laterality: N/A;   COLONOSCOPY WITH PROPOFOL N/A 03/19/2022   Procedure: COLONOSCOPY WITH PROPOFOL;  Surgeon: Toney Reil, MD;  Location[facility] ENDOSCOPY;  Service: Gastroenterology;  Laterality: N/A;   TONSILLECTOMY AND ADENOIDECTOMY     No family history on file. Social History   Socioeconomic History   Marital status: Married    Spouse name: Not on file   Number of children: Not on file    Years of education: Not on file   Highest education level: Not on file  Occupational History   Not on file  Tobacco Use   Smoking status: Former    Packs/day: 1.00    Years: 51.00    Additional pack years: 0.00    Total pack years: 51.00    Types: Cigarettes    Quit date: 10/01/2022    Years since quitting: 0.0   Smokeless tobacco: Never   Tobacco comments:    1ppd currently  Vaping Use   Vaping Use: Never used  Substance and Sexual Activity   Alcohol use: Yes   Drug use: No   Sexual activity: Not Currently  Other Topics Concern   Not on file  Social History Narrative   Not on file   Social Determinants of Health   Financial Resource Strain: Low Risk  (10/16/2022)   Overall Financial Resource Strain (CARDIA)    Difficulty of Paying Living Expenses: Not hard at all  Food Insecurity: No Food Insecurity (10/16/2022)   Hunger Vital Sign    Worried About Running Out of Food in the Last Year: Never true    Ran Out of Food in the Last Year: Never true  Transportation Needs: No Transportation Needs (10/16/2022)   PRAPARE - Administrator, Civil Service (Medical): No    Lack of Transportation (Non-Medical): No  Physical Activity: Sufficiently Active (10/16/2022)   Exercise Vital Sign    Days of Exercise per Week: 3 days    Minutes of Exercise per Session: 90 min  Stress: No Stress Concern Present (10/16/2022)   [employer] of Occupational Health - Occupational Stress Questionnaire    Feeling of Stress : Only a little  Social Connections: Moderately Integrated (10/16/2022)   Social Connection and Isolation Panel [NHANES]    Frequency of Communication with Friends and Family: Three times a week    Frequency of Social Gatherings with Friends and Family: More than three times a week    Attends Religious Services: More than 4 times per year    Active Member of [employer] or Organizations: No    Attends Engineer, structural: Never    Marital Status: Married     Tobacco Counseling Counseling given: Not Answered Tobacco comments: 1ppd currently   Clinical Intake:  Pre-visit preparation completed: Yes  Pain : No/denies pain Pain Score: 0-No pain     BMI - recorded: 26.75 Nutritional Status: BMI 25 -29 Overweight Nutritional Risks: None Diabetes: No  How often do you need to have someone help you when you read instructions, pamphlets, or other written materials from your doctor or pharmacy?: 1 - Never  Interpreter Needed?: No  Comments: lives with wife Information entered by :: B.Onesty Clair,LPN   Activities of Daily Living    10/16/2022    2:58 PM 09/28/2022    6:51  PM  In your present state of health, do you have any difficulty performing the following activities:  Hearing? 0 0  Vision? 0 0  Difficulty concentrating or making decisions? 0 0  Walking or climbing stairs? 0 0  Dressing or bathing? 0 0  Doing errands, shopping? 0 0  Preparing Food and eating ? N   Using the Toilet? N   In the past six months, have you accidently leaked urine? N   Do you have problems with loss of bowel control? N   Managing your Medications? N   Managing your Finances? N   Housekeeping or managing your Housekeeping? N     Patient Care Team: Margarita Mail, DO as PCP - General (Internal Medicine) Iran Ouch, MD as PCP - Cardiology (Cardiology) Rickard Patience, MD as Consulting Physician (Oncology)  Indicate any recent Medical Services you may have received from other than [facility] providers in the past year (date may be approximate).     Assessment:   This is a routine wellness examination for [facility].  Hearing/Vision screen Hearing Screening - Comments:: Adequate hearing Vision Screening - Comments:: Adequate vision  [facility]  Dietary issues and exercise activities discussed:     Goals Addressed   None    Depression Screen    10/16/2022    2:55 PM 09/28/2022   11:01 AM 06/30/2022    2:40 PM 06/02/2022    9:15 AM  05/21/2022    1:19 PM 02/11/2021    3:49 PM 09/06/2020    9:39 AM  PHQ 2/9 Scores  PHQ - 2 Score 0 0 0 0 0 0 0  PHQ- 9 Score  0 0 0 0      Fall Risk    10/16/2022    2:48 PM 09/28/2022   11:01 AM 06/30/2022    2:36 PM 06/02/2022    9:15 AM 05/21/2022    1:18 PM  Fall Risk   Falls in the past year? 1 1 0 1 1  Number falls in past yr: 0 1 0 1 0  Injury with Fall? 1 1 0 0 0  Risk for fall due to : No Fall Risks      Follow up Education provided;Falls prevention discussed        MEDICARE RISK AT HOME:  Medicare Risk at Home - 10/16/22 1450     Any stairs in or around the home? Yes    If so, are there any without handrails? Yes    Home free of loose throw rugs in walkways, pet beds, electrical cords, etc? Yes    Adequate lighting in your home to reduce risk of falls? Yes    Life alert? No    Use of a cane, walker or w/c? No    Grab bars in the bathroom? Yes    Shower chair or bench in shower? Yes    Elevated toilet seat or a handicapped toilet? Yes             TIMED UP AND GO:  Was the test performed?  Yes  Length of time to ambulate 10 feet: 12 sec Gait steady and fast without use of assistive device    Cognitive Function:        10/16/2022    2:58 PM  6CIT Screen  What Year? 0 points  What month? 0 points  What time? 0 points  Count back from 20 0 points  Months in reverse 0 points  Repeat phrase 0  points  Total Score 0 points    Immunizations Immunization History  Administered Date(s) Administered   Fluad Quad(high Dose 65+) 01/13/2022   Influenza, High Dose Seasonal PF 01/12/2018   Influenza-Unspecified 01/06/2021   PFIZER(Purple Top)SARS-COV-2 Vaccination 06/03/2019, 06/27/2019, 01/11/2020, 07/26/2020   PNEUMOCOCCAL CONJUGATE-20 02/10/2021   Pfizer Covid-19 Vaccine Bivalent Booster 5y-11y 01/06/2021   Tdap 02/10/2021   Zoster Recombinant(Shingrix) 05/10/2014    TDAP status: Up to date  Flu Vaccine status: Up to date  Pneumococcal vaccine status: Up  to date  Covid-19 vaccine status: Completed vaccines  Qualifies for Shingles Vaccine? Yes   Zostavax completed Yes   Shingrix Completed?: Yes  Screening Tests Health Maintenance  Topic Date Due   Hepatitis C Screening  Never done   COVID-19 Vaccine (6 - 2023-24 season) 12/12/2021   Zoster Vaccines- Shingrix (2 of 2) 12/29/2022 (Originally 07/05/2014)   INFLUENZA VACCINE  11/12/2022   Colonoscopy  03/20/2023   Lung Cancer Screening  09/28/2023   Medicare Annual Wellness (AWV)  10/16/2023   DTaP/Tdap/Td (2 - Td or Tdap) 02/11/2031   Pneumonia Vaccine 53+ Years old  Completed   HPV VACCINES  Aged Out    Health Maintenance  Health Maintenance Due  Topic Date Due   Hepatitis C Screening  Never done   COVID-19 Vaccine (6 - 2023-24 season) 12/12/2021    Colorectal cancer screening: Type of screening: Colonoscopy. Completed yes. Repeat every 5-10 years  Lung Cancer Screening: (Low Dose CT Chest recommended if Age 5-80 years, 20 pack-year currently smoking OR have quit w/in 15years.) does qualify.   Lung Cancer Screening Referral: no  Additional Screening:  Hepatitis C Screening: does not qualify; Completed yes  Vision Screening: Recommended annual ophthalmology exams for early detection of glaucoma and other disorders of the eye. Is the patient up to date with their annual eye exam?  Yes  Who is the provider or what is the name of the office in which the patient attends annual eye exams? [facility] If pt is not established with a provider, would they like to be referred to a provider to establish care? No .   Dental Screening: Recommended annual dental exams for proper oral hygiene  Diabetic Foot Exam: n/a  Community Resource Referral / Chronic Care Management: CRR required this visit?  No   CCM required this visit?  Appt scheduled with PCP     Plan:     I have personally reviewed and noted the following in the patient's chart:   Medical and social  history Use of alcohol, tobacco or illicit drugs  Current medications and supplements including opioid prescriptions. Patient is not currently taking opioid prescriptions. Functional ability and status Nutritional status Physical activity Advanced directives List of other physicians Hospitalizations, surgeries, and ER visits in previous 12 months Vitals Screenings to include cognitive, depression, and falls Referrals and appointments  In addition, I have reviewed and discussed with patient certain preventive protocols, quality metrics, and best practice recommendations. A written personalized care plan for preventive services as well as general preventive health recommendations were provided to patient.     Sue Lush, LPN   04/18/1094   After Visit Summary: Printed and given to pt.  Nurse Notes: pt presents with O2l via NL.the patient walking fast.; denies any SOB. He relays he has been doing well since he got out of the hospital as he has resumed building his deck. Pt voices no concerns or questions.  Pt is noted NOT to have  follow up with PCP. Appt made for first available 10/30/22.

## 2022-10-16 NOTE — Patient Instructions (Addendum)
Joshua Schmidt , Thank you for taking time to come for your Medicare Wellness Visit. I appreciate your ongoing commitment to your health goals. Please review the following plan we discussed and let me know if I can assist you in the future.   These are the goals we discussed:  Goals   None     This is a list of the screening recommended for you and due dates:  Health Maintenance  Topic Date Due   Hepatitis C Screening  Never done   COVID-19 Vaccine (6 - 2023-24 season) 12/12/2021   Zoster (Shingles) Vaccine (2 of 2) 12/29/2022*   Flu Shot  11/12/2022   Colon Cancer Screening  03/20/2023   Screening for Lung Cancer  09/28/2023   Medicare Annual Wellness Visit  10/16/2023   DTaP/Tdap/Td vaccine (2 - Td or Tdap) 02/11/2031   Pneumonia Vaccine  Completed   HPV Vaccine  Aged Out  *Topic was postponed. The date shown is not the original due date.    Advanced directives: yes  Conditions/risks identified: low falls risk  Next appointment: Follow up in one year for your annual wellness visit. 10/21/2023 @ 2:30pm  Preventive Care 65 Years and Older, Male  Preventive care refers to lifestyle choices and visits with your health care provider that can promote health and wellness. What does preventive care include? A yearly physical exam. This is also called an annual well check. Dental exams once or twice a year. Routine eye exams. Ask your health care provider how often you should have your eyes checked. Personal lifestyle choices, including: Daily care of your teeth and gums. Regular physical activity. Eating a healthy diet. Avoiding tobacco and drug use. Limiting alcohol use. Practicing safe sex. Taking low doses of aspirin every day. Taking vitamin and mineral supplements as recommended by your health care provider. What happens during an annual well check? The services and screenings done by your health care provider during your annual well check will depend on your age, overall  health, lifestyle risk factors, and family history of disease. Counseling  Your health care provider may ask you questions about your: Alcohol use. Tobacco use. Drug use. Emotional well-being. Home and relationship well-being. Sexual activity. Eating habits. History of falls. Memory and ability to understand (cognition). Work and work Astronomer. Screening  You may have the following tests or measurements: Height, weight, and BMI. Blood pressure. Lipid and cholesterol levels. These may be checked every 5 years, or more frequently if you are over 39 years old. Skin check. Lung cancer screening. You may have this screening every year starting at age 97 if you have a 30-pack-year history of smoking and currently smoke or have quit within the past 15 years. Fecal occult blood test (FOBT) of the stool. You may have this test every year starting at age 2. Flexible sigmoidoscopy or colonoscopy. You may have a sigmoidoscopy every 5 years or a colonoscopy every 10 years starting at age 45. Prostate cancer screening. Recommendations will vary depending on your family history and other risks. Hepatitis C blood test. Hepatitis B blood test. Sexually transmitted disease (STD) testing. Diabetes screening. This is done by checking your blood sugar (glucose) after you have not eaten for a while (fasting). You may have this done every 1-3 years. Abdominal aortic aneurysm (AAA) screening. You may need this if you are a current or former smoker. Osteoporosis. You may be screened starting at age 42 if you are at high risk. Talk with your health care provider  about your test results, treatment options, and if necessary, the need for more tests. Vaccines  Your health care provider may recommend certain vaccines, such as: Influenza vaccine. This is recommended every year. Tetanus, diphtheria, and acellular pertussis (Tdap, Td) vaccine. You may need a Td booster every 10 years. Zoster vaccine. You may  need this after age 15. Pneumococcal 13-valent conjugate (PCV13) vaccine. One dose is recommended after age 62. Pneumococcal polysaccharide (PPSV23) vaccine. One dose is recommended after age 35. Talk to your health care provider about which screenings and vaccines you need and how often you need them. This information is not intended to replace advice given to you by your health care provider. Make sure you discuss any questions you have with your health care provider. Document Released: 04/26/2015 Document Revised: 12/18/2015 Document Reviewed: 01/29/2015 Elsevier Interactive Patient Education  2017 [employer].  Fall Prevention in the Home Falls can cause injuries. They can happen to people of all ages. There are many things you can do to make your home safe and to help prevent falls. What can I do on the outside of my home? Regularly fix the edges of walkways and driveways and fix any cracks. Remove anything that might make you trip as you walk through a door, such as a raised step or threshold. Trim any bushes or trees on the path to your home. Use bright outdoor lighting. Clear any walking paths of anything that might make someone trip, such as rocks or tools. Regularly check to see if handrails are loose or broken. Make sure that both sides of any steps have handrails. Any raised decks and porches should have guardrails on the edges. Have any leaves, snow, or ice cleared regularly. Use sand or salt on walking paths during winter. Clean up any spills in your garage right away. This includes oil or grease spills. What can I do in the bathroom? Use night lights. Install grab bars by the toilet and in the tub and shower. Do not use towel bars as grab bars. Use non-skid mats or decals in the tub or shower. If you need to sit down in the shower, use a plastic, non-slip stool. Keep the floor dry. Clean up any water that spills on the floor as soon as it happens. Remove soap buildup in  the tub or shower regularly. Attach bath mats securely with double-sided non-slip rug tape. Do not have throw rugs and other things on the floor that can make you trip. What can I do in the bedroom? Use night lights. Make sure that you have a light by your bed that is easy to reach. Do not use any sheets or blankets that are too big for your bed. They should not hang down onto the floor. Have a firm chair that has side arms. You can use this for support while you get dressed. Do not have throw rugs and other things on the floor that can make you trip. What can I do in the kitchen? Clean up any spills right away. Avoid walking on wet floors. Keep items that you use a lot in easy-to-reach places. If you need to reach something above you, use a strong step stool that has a grab bar. Keep electrical cords out of the way. Do not use floor polish or wax that makes floors slippery. If you must use wax, use non-skid floor wax. Do not have throw rugs and other things on the floor that can make you trip. What can I do  with my stairs? Do not leave any items on the stairs. Make sure that there are handrails on both sides of the stairs and use them. Fix handrails that are broken or loose. Make sure that handrails are as long as the stairways. Check any carpeting to make sure that it is firmly attached to the stairs. Fix any carpet that is loose or worn. Avoid having throw rugs at the top or bottom of the stairs. If you do have throw rugs, attach them to the floor with carpet tape. Make sure that you have a light switch at the top of the stairs and the bottom of the stairs. If you do not have them, ask someone to add them for you. What else can I do to help prevent falls? Wear shoes that: Do not have high heels. Have rubber bottoms. Are comfortable and fit you well. Are closed at the toe. Do not wear sandals. If you use a stepladder: Make sure that it is fully opened. Do not climb a closed  stepladder. Make sure that both sides of the stepladder are locked into place. Ask someone to hold it for you, if possible. Clearly mark and make sure that you can see: Any grab bars or handrails. First and last steps. Where the edge of each step is. Use tools that help you move around (mobility aids) if they are needed. These include: Canes. Walkers. Scooters. Crutches. Turn on the lights when you go into a dark area. Replace any light bulbs as soon as they burn out. Set up your furniture so you have a clear path. Avoid moving your furniture around. If any of your floors are uneven, fix them. If there are any pets around you, be aware of where they are. Review your medicines with your doctor. Some medicines can make you feel dizzy. This can increase your chance of falling. Ask your doctor what other things that you can do to help prevent falls. This information is not intended to replace advice given to you by your health care provider. Make sure you discuss any questions you have with your health care provider. Document Released: 01/24/2009 Document Revised: 09/05/2015 Document Reviewed: 05/04/2014 Elsevier Interactive Patient Education  2017 [employer].

## 2022-10-28 NOTE — Progress Notes (Deleted)
Established Patient Office Visit  Subjective    Patient ID: Joshua Schmidt, male    DOB: March 09, 1951  Age: 72 y.o. MRN: [id number]  CC:  No chief complaint on file.   HPI Joshua Schmidt presents to follow up.   Discharge Date: 10/05/22 Diagnosis: CHF exacerbation, COPD exacerbation, acute respiratory failure  Procedures/tests: BNP on admission 826, troponin levels elevated. Chest x-ray with enlarged cardiac contours with prominent bilateral interstitial opacities. CTA negative for PE, dilated ascending thoracic aorta measuring up to 4.5 cm Consultants: *** New medications: *** Discontinued medications: *** Discharge instructions:  *** Status: {Blank multiple:19196::"better","worse","stable","fluctuating"}   Hypertension: -Medications: Coreg 12.5 mg BID, HCTZ 25 mg, Lisinopril 20 mg discontinued at LOV due to facial swelling, now on Losartan 50 mg -Patient is compliant with above medications and reports no side effects.  Patient states he is doing really well with medication change.  He denies any further incidents with facial swelling and has improved energy since switching her blood pressure medications. -Checking BP at home (average): 132/72-74 -Denies any SOB, CP, vision changes, LE edema or symptoms of hypotension -Following with Cardiology, last seen on 05/19/22  HLD/History of MI/Aortic and Iliac Aneurysms/Chronic Venous Insufficiency:   -Medications: Lipitor 20 mg, aspirin 81 mg -Patient is compliant with above medications and reports no side effects.  -History of MI in 2022 for which he did not seek medical attention at the time, was diagnosed retroactively  -Following with vascular surgery for abnormal ABI's -Last lipid panel: Lipid Panel     Component Value Date/Time   CHOL 108 09/29/2022 0507   TRIG 55 09/29/2022 0507   HDL 56 09/29/2022 0507   CHOLHDL 1.9 09/29/2022 0507   VLDL 11 09/29/2022 0507   LDLCALC 41 09/29/2022 0507   LDLCALC 50 10/21/2021 1145    Erythrocytosis:  -Following with Hematology, last seen 06/10/22 -Has labs monthly, sometimes requires phlebotomy but hasn't had to have one in 2 months.  -Last CBC 10/23 hgb 17.7  COPD: -COPD status: stable -Current medications: Nothing  -Oxygen use: no -Dyspnea frequency: None -Cough frequency: Daily, productive  -Limitation of activity: no -Pneumovax: Up to Date -Influenza: Up to Date -Annual lung cancer screening: 10/23 Lung-RADS-2  -Current smoker but trying to decrease  -Does have some post nasal drip, does will with Flonase   PTSD: -Not currently on medication -Had done counseling in the past but not interested it anything now  Health Maintance: -Blood work UTD -Colon cancer screening: colonoscopy 12/23, 3 polyps removed, plan tto follow up in 1 year   Outpatient Encounter Medications as of 10/30/2022  Medication Sig   albuterol (VENTOLIN HFA) 108 (90 Base) MCG/ACT inhaler Inhale 2 puffs into the lungs every 6 (six) hours as needed for wheezing or shortness of breath.   aspirin 81 MG chewable tablet Chew 81 mg by mouth daily.   atorvastatin (LIPITOR) 20 MG tablet TAKE 1 TABLET BY MOUTH DAILY   carvedilol (COREG) 12.5 MG tablet Take 1 tablet (12.5 mg total) by mouth 2 (two) times daily.   dextromethorphan-guaiFENesin (MUCINEX DM) 30-600 MG 12hr tablet Take 1 tablet by mouth 2 (two) times daily as needed for cough. (Patient not taking: Reported on 10/16/2022)   fluticasone (FLONASE) 50 MCG/ACT nasal spray Place 2 sprays into both nostrils daily.   folic acid (FOLVITE) 1 MG tablet Take 1 tablet (1 mg total) by mouth daily.   hydrochlorothiazide (HYDRODIURIL) 25 MG tablet Take 25 mg by mouth daily.   lisinopril (ZESTRIL) 20 MG  tablet Take 20 mg by mouth daily.   losartan (COZAAR) 50 MG tablet Take 1 tablet (50 mg total) by mouth daily.   metoprolol tartrate (LOPRESSOR) 25 MG tablet Take 25 mg by mouth 2 (two) times daily.   Multiple Vitamin (MULTIVITAMIN WITH MINERALS) TABS  tablet Take 1 tablet by mouth daily.   predniSONE (STERAPRED UNI-PAK 21 TAB) 10 MG (21) TBPK tablet Take 4 tablet tomorrow, decrease 1 tablet every other day until you finish all the course (Patient not taking: Reported on 10/16/2022)   umeclidinium bromide (INCRUSE ELLIPTA) 62.5 MCG/ACT AEPB Inhale 1 puff into the lungs daily.   No facility-administered encounter medications on file as of 10/30/2022.    Past Medical History:  Diagnosis Date   Aneurysm (HCC)    Erythrocytosis 12/01/2018   Hyperlipidemia    Hypertension    Myocardial infarction [facility])     Past Surgical History:  Procedure Laterality Date   APPENDECTOMY     COLONOSCOPY WITH PROPOFOL N/A 11/20/2014   Procedure: COLONOSCOPY WITH PROPOFOL;  Surgeon: Kieth Brightly, MD;  Location: [facility] ENDOSCOPY;  Service: Endoscopy;  Laterality: N/A;   COLONOSCOPY WITH PROPOFOL N/A 03/19/2022   Procedure: COLONOSCOPY WITH PROPOFOL;  Surgeon: Toney Reil, MD;  Location: [facility] ENDOSCOPY;  Service: Gastroenterology;  Laterality: N/A;   TONSILLECTOMY AND ADENOIDECTOMY      No family history on file.  Social History   Socioeconomic History   Marital status: Married    Spouse name: Not on file   Number of children: Not on file   Years of education: Not on file   Highest education level: Not on file  Occupational History   Not on file  Tobacco Use   Smoking status: Former    Current packs/day: 0.00    Average packs/day: 1 pack/day for 51.0 years (51.0 ttl pk-yrs)    Types: Cigarettes    Start date: 10/01/1971    Quit date: 10/01/2022    Years since quitting: 0.0   Smokeless tobacco: Never   Tobacco comments:    1ppd currently  Vaping Use   Vaping status: Never Used  Substance and Sexual Activity   Alcohol use: Yes   Drug use: No   Sexual activity: Not Currently  Other Topics Concern   Not on file  Social History Narrative   Not on file   Social Determinants of Health   Financial Resource Strain: Low Risk   (10/16/2022)   Overall Financial Resource Strain (CARDIA)    Difficulty of Paying Living Expenses: Not hard at all  Food Insecurity: No Food Insecurity (10/16/2022)   Hunger Vital Sign    Worried About Running Out of Food in the Last Year: Never true    Ran Out of Food in the Last Year: Never true  Transportation Needs: No Transportation Needs (10/16/2022)   PRAPARE - Administrator, Civil Service (Medical): No    Lack of Transportation (Non-Medical): No  Physical Activity: Sufficiently Active (10/16/2022)   Exercise Vital Sign    Days of Exercise per Week: 3 days    Minutes of Exercise per Session: 90 min  Stress: No Stress Concern Present (10/16/2022)   [employer] of Occupational Health - Occupational Stress Questionnaire    Feeling of Stress : Only a little  Social Connections: Moderately Integrated (10/16/2022)   Social Connection and Isolation Panel [NHANES]    Frequency of Communication with Friends and Family: Three times a week    Frequency of Social Gatherings with  Friends and Family: More than three times a week    Attends Religious Services: More than 4 times per year    Active Member of Clubs or Organizations: No    Attends Banker Meetings: Never    Marital Status: Married  Catering manager Violence: Not At Risk (10/16/2022)   Humiliation, Afraid, Rape, and Kick questionnaire    Fear of Current or Ex-Partner: No    Emotionally Abused: No    Physically Abused: No    Sexually Abused: No    Review of Systems  Constitutional:  Negative for chills and fever.  Eyes:  Negative for blurred vision.  Cardiovascular:  Negative for chest pain, palpitations and leg swelling.  Gastrointestinal:  Negative for abdominal pain.        Objective    There were no vitals taken for this visit.  Physical Exam Constitutional:      Appearance: Normal appearance.  HENT:     Head: Normocephalic and atraumatic.  Eyes:     Conjunctiva/sclera: Conjunctivae  normal.  Cardiovascular:     Rate and Rhythm: Normal rate and regular rhythm.  Pulmonary:     Effort: Pulmonary effort is normal.     Breath sounds: Normal breath sounds.  Musculoskeletal:     Right lower leg: No edema.     Left lower leg: No edema.  Skin:    General: Skin is warm and dry.  Neurological:     General: No focal deficit present.     Mental Status: He is alert. Mental status is at baseline.  Psychiatric:        Mood and Affect: Mood normal.        Behavior: Behavior normal.     Last CBC Lab Results  Component Value Date   WBC 11.5 (H) 10/02/2022   HGB 15.3 10/09/2022   HCT 50.9 10/09/2022   MCV 93.2 10/02/2022   MCH 28.7 10/02/2022   RDW 16.3 (H) 10/02/2022   PLT 215 10/02/2022   Last metabolic panel Lab Results  Component Value Date   GLUCOSE 123 (H) 10/01/2022   NA 137 10/01/2022   K 3.9 10/01/2022   CL 85 (L) 10/01/2022   CO2 41 (H) 10/01/2022   BUN 21 10/01/2022   CREATININE 0.83 10/01/2022   GFRNONAA >60 10/01/2022   CALCIUM 8.4 (L) 10/01/2022   PROT 7.3 01/22/2022   ALBUMIN 3.6 01/22/2022   BILITOT 0.6 01/22/2022   ALKPHOS 58 01/22/2022   AST 24 01/22/2022   ALT 20 01/22/2022   ANIONGAP 11 10/01/2022   Last lipids Lab Results  Component Value Date   CHOL 108 09/29/2022   HDL 56 09/29/2022   LDLCALC 41 09/29/2022   TRIG 55 09/29/2022   CHOLHDL 1.9 09/29/2022   Last hemoglobin A1c Lab Results  Component Value Date   HGBA1C 5.4 09/28/2022   Last thyroid functions Lab Results  Component Value Date   TSH 0.69 10/21/2021   Last vitamin D No results found for: "25OHVITD2", "25OHVITD3", "VD25OH" Last vitamin B12 and Folate No results found for: "VITAMINB12", "FOLATE"      Assessment & Plan:   1. Hypertension, unspecified type: Blood pressure better controlled, patient feeling better and no further episodes of facial swelling. Continue Losartan 50 mg, refilled. Continue Coreg 12.5 mg BID, HCTZ 25 mg as well.   - losartan  (COZAAR) 50 MG tablet; Take 1 tablet (50 mg total) by mouth daily.  Dispense: 90 tablet; Refill: 1  2. Mixed hyperlipidemia: Stable, refill  Lipitor 20 mg. Patient is considering surgical repair of aortic aneurysms, concerned about the cost of that procedure.   - atorvastatin (LIPITOR) 20 MG tablet; Take 1 tablet (20 mg total) by mouth daily.  Dispense: 90 tablet; Refill: 1   No follow-ups on file.   Margarita Mail, DO

## 2022-10-30 ENCOUNTER — Inpatient Hospital Stay: Payer: 59 | Admitting: Internal Medicine

## 2022-11-06 ENCOUNTER — Ambulatory Visit: Payer: 59 | Admitting: Adult Health

## 2022-11-06 ENCOUNTER — Encounter: Payer: Self-pay | Admitting: Adult Health

## 2022-11-06 VITALS — BP 124/84 | HR 65 | Temp 97.5°F | Ht 75.0 in | Wt 220.6 lb

## 2022-11-06 DIAGNOSIS — J449 Chronic obstructive pulmonary disease, unspecified: Secondary | ICD-10-CM | POA: Insufficient documentation

## 2022-11-06 DIAGNOSIS — J9611 Chronic respiratory failure with hypoxia: Secondary | ICD-10-CM | POA: Diagnosis not present

## 2022-11-06 DIAGNOSIS — Z720 Tobacco use: Secondary | ICD-10-CM

## 2022-11-06 MED ORDER — STIOLTO RESPIMAT 2.5-2.5 MCG/ACT IN AERS: 2.0000 | INHALATION_SPRAY | Freq: Every day | RESPIRATORY_TRACT | 0 refills | Status: DC

## 2022-11-06 MED ORDER — STIOLTO RESPIMAT 2.5-2.5 MCG/ACT IN AERS: 2.0000 | INHALATION_SPRAY | Freq: Every day | RESPIRATORY_TRACT | 5 refills | Status: DC

## 2022-11-06 NOTE — Assessment & Plan Note (Signed)
Patient has quit smoking.  Congratulated on cessation.

## 2022-11-06 NOTE — Patient Instructions (Addendum)
Begin Stiolto 2 puffs daily  Albuterol inhaler As needed   Continue with yearly CT chest screening program.  Great job not smoking  Decrease Oxygen 2l/m  Order for POC .  Follow up with Dr. Jayme Cloud in 6-8 weeks with PFT and As needed

## 2022-11-06 NOTE — Assessment & Plan Note (Signed)
New onset hypoxic and hypercarbic respiratory failure with hospitalization June 2024.  Since discharge patient is improving.  Has decreased oxygen demands.  Was able to walk in the office today on 2 L pulsed oxygen on POC device and maintain O2 saturations greater than 88 to 9%.  Order to DME company for POC.  Patient needs a more portable system as it is hard for him to carry heavy tanks with him and be more active. May decrease oxygen to 2 L.

## 2022-11-06 NOTE — Assessment & Plan Note (Signed)
Presumed severe COPD with recent hospitalization for exacerbation.  Will need PFTs going forward.  Congratulated on smoking cessation.  Pneumonia vaccine up-to-date.  Continue with lung cancer CT chest screening program.  On return visit consider alpha-1 testing.  Will begin LABA/LAMA maintenance inhaler with Stiolto.  Plan  Patient Instructions  Begin Stiolto 2 puffs daily  Albuterol inhaler As needed   Continue with yearly CT chest screening program.  Great job not smoking  Decrease Oxygen 2l/m  Order for POC .  Follow up with Dr. Jayme Cloud in 6-8 weeks with PFT and As needed

## 2022-11-06 NOTE — Progress Notes (Signed)
@Patient  ID: Joshua Schmidt, male    DOB: [birth year], 72 y.o.   MRN: [id number]  Chief Complaint  Patient presents with   Consult    Referring provider: Margarita Mail, DO  HPI: 72 year old male former smoker(quit  09/2022) seen for pulmonary consult during hospitalization June 2024 for COPD exacerbation and acute hypoxic/hypercarbic respiratory failure and Diastolic CHF flare .  Participates in the lung cancer CT chest screening program Medical history significant for DVT, alcohol abuse, erythrocytosis followed by hematology,   TEST/EVENTS :  History: Product/process development scientist, served in [employer], exposure to organic dust and asbestos.smoker   2D echo October 02, 2022 EF 60-65%, right ventricular size normal right ventricular systolic function normal  Venous Dopplers September 29, 2022 negative  CT chest -neg PE , 5 mm left lower lobe nodule, dilated ascending thoracic aorta 4.5 cm.  ABG October 02, 2022 pH 7.45, pCO2 78, pO2 69, bicarb 54  11/06/2022 Follow up ; COPD, O2 RF , post hospital follow-up Patient presents for a posthospital follow-up.  Patient was admitted last month for a COPD exacerbation and decompensated diastolic heart failure.  He presented with acute respiratory failure with O2 saturations in the 70s.  He was treated with empiric antibiotics and steroids and nebulized bronchodilators.  Patient was seen for a pulmonary consult during hospitalization.  Recommended on COPD management.  Patient has never had a formal evaluation for COPD.  Did not carry a previous diagnosis prior to admission.  He was recommended to begin Spiriva at discharge.  Patient says he did not receive a prescription.  ABG during hospitalization showed hypercarbia with a pH at 7.45 and a pCO2 at 78.  He did require oxygen during hospital stay and continued to have desaturations prior to discharge.  He was discharged on oxygen at 2 L.  He did require diuresis.  Echo showed preserved EF. Since discharge patient  says he is feeling better with less dyspnea. Oxygen has really helped a lot.  He is able to do more activities now.  Oxygen tanks are heavy he is looking for more portable system.  Says he has been checking his oxygen levels at home and they have been remaining above 90%.  Today in the office walk test showed patient was able to maintain O2 saturations at 2 L pulse oxygen on POC device and maintain O2 saturations greater than 88 to 90%.  O2 saturations on room air 87%.   No Known Allergies  Immunization History  Administered Date(s) Administered   Fluad Quad(high Dose 65+) 01/13/2022   Influenza, High Dose Seasonal PF 01/12/2018   Influenza-Unspecified 01/06/2021   PFIZER(Purple Top)SARS-COV-2 Vaccination 06/03/2019, 06/27/2019, 01/11/2020, 07/26/2020   PNEUMOCOCCAL CONJUGATE-20 02/10/2021   Pfizer Covid-19 Vaccine Bivalent Booster 5y-11y 01/06/2021   Tdap 02/10/2021   Zoster Recombinant(Shingrix) 05/10/2014    Past Medical History:  Diagnosis Date   Aneurysm (HCC)    Erythrocytosis 12/01/2018   Hyperlipidemia    Hypertension    Myocardial infarction (HCC)     Tobacco History: Social History   Tobacco Use  Smoking Status Former   Current packs/day: 0.00   Average packs/day: 1 pack/day for 51.0 years (51.0 ttl pk-yrs)   Types: Cigarettes   Start date: 10/01/1971   Quit date: 10/01/2022   Years since quitting: 0.0  Smokeless Tobacco Never   Counseling given: Not Answered   Outpatient Medications Prior to Visit  Medication Sig Dispense Refill   albuterol (VENTOLIN HFA) 108 (90 Base) MCG/ACT inhaler Inhale 2 puffs  into the lungs every 6 (six) hours as needed for wheezing or shortness of breath. 8 g 2   aspirin 81 MG chewable tablet Chew 81 mg by mouth daily.     atorvastatin (LIPITOR) 20 MG tablet TAKE 1 TABLET BY MOUTH DAILY 100 tablet 2   carvedilol (COREG) 12.5 MG tablet Take 1 tablet (12.5 mg total) by mouth 2 (two) times daily. 180 tablet 3    dextromethorphan-guaiFENesin (MUCINEX DM) 30-600 MG 12hr tablet Take 1 tablet by mouth 2 (two) times daily as needed for cough. 30 tablet 0   fluticasone (FLONASE) 50 MCG/ACT nasal spray Place 2 sprays into both nostrils daily. 16 g 6   hydrochlorothiazide (HYDRODIURIL) 25 MG tablet Take 25 mg by mouth daily.     lisinopril (ZESTRIL) 20 MG tablet Take 20 mg by mouth daily.     metoprolol tartrate (LOPRESSOR) 25 MG tablet Take 25 mg by mouth 2 (two) times daily.     Multiple Vitamin (MULTIVITAMIN WITH MINERALS) TABS tablet Take 1 tablet by mouth daily. 90 tablet 0   folic acid (FOLVITE) 1 MG tablet Take 1 tablet (1 mg total) by mouth daily. (Patient not taking: Reported on 11/06/2022) 90 tablet 0   losartan (COZAAR) 50 MG tablet Take 1 tablet (50 mg total) by mouth daily. (Patient not taking: Reported on 11/06/2022) 90 tablet 1   predniSONE (STERAPRED UNI-PAK 21 TAB) 10 MG (21) TBPK tablet Take 4 tablet tomorrow, decrease 1 tablet every other day until you finish all the course (Patient not taking: Reported on 10/16/2022) 21 tablet 0   umeclidinium bromide (INCRUSE ELLIPTA) 62.5 MCG/ACT AEPB Inhale 1 puff into the lungs daily. (Patient not taking: Reported on 11/06/2022) 30 each 1   No facility-administered medications prior to visit.     Review of Systems:   Constitutional:   No  weight loss, night sweats,  Fevers, chills, fatigue, or  lassitude.  HEENT:   No headaches,  Difficulty swallowing,  Tooth/dental problems, or  Sore throat,                No sneezing, itching, ear ache, nasal congestion, post nasal drip,   CV:  No chest pain,  Orthopnea, PND, anasarca, dizziness, palpitations, syncope.   GI  No heartburn, indigestion, abdominal pain, nausea, vomiting, diarrhea, change in bowel habits, loss of appetite, bloody stools.   Resp:  No excess mucus, no productive cough,  No non-productive cough,  No coughing up of blood.  No change in color of mucus.  No wheezing.  No chest wall  deformity  Skin: no rash or lesions.  GU: no dysuria, change in color of urine, no urgency or frequency.  No flank pain, no hematuria   MS:  No joint pain or swelling.  No decreased range of motion.  No back pain.    Physical Exam  BP 124/84 (BP Location: Left Arm, Cuff Size: Large)   Pulse 65   Temp (!) 97.5 F (36.4 C)   Ht 6\' 3"  (1.905 m)   Wt 220 lb 9.6 oz (100.1 kg)   SpO2 96%   BMI 27.57 kg/m   GEN: A/Ox3; pleasant , NAD, well nourished    HEENT:  [place]/AT,  EACs-clear, TMs-wnl, NOSE-clear, THROAT-clear, no lesions, no postnasal drip or exudate noted.   NECK:  Supple w/ fair ROM; no JVD; normal carotid impulses w/o bruits; no thyromegaly or nodules palpated; no lymphadenopathy.    RESP  Clear  P & A; w/o, wheezes/ rales/ or  rhonchi. no accessory muscle use, no dullness to percussion  CARD:  RRR, no m/r/g, tr peripheral edema, pulses intact, no cyanosis or clubbing.  GI:   Soft & nt; nml bowel sounds; no organomegaly or masses detected.   Musco: Warm bil, no deformities or joint swelling noted.   Neuro: alert, no focal deficits noted.    Skin: Warm, no lesions or rashes    Lab Results:  CBC    Component Value Date/Time   WBC 11.5 (H) 10/02/2022 0540   RBC 6.02 (H) 10/02/2022 0540   HGB 15.3 10/09/2022 1239   HCT 50.9 10/09/2022 1239   PLT 215 10/02/2022 0540   MCV 93.2 10/02/2022 0540   MCH 28.7 10/02/2022 0540   MCHC 30.8 10/02/2022 0540   RDW 16.3 (H) 10/02/2022 0540   LYMPHSABS 1.0 06/10/2022 1258   MONOABS 0.8 06/10/2022 1258   EOSABS 0.2 06/10/2022 1258   BASOSABS 0.0 06/10/2022 1258    BMET    Component Value Date/Time   NA 137 10/01/2022 0436   K 3.9 10/01/2022 0436   CL 85 (L) 10/01/2022 0436   CO2 41 (H) 10/01/2022 0436   GLUCOSE 123 (H) 10/01/2022 0436   BUN 21 10/01/2022 0436   CREATININE 0.83 10/01/2022 0436   CREATININE 0.78 10/21/2021 1145   CALCIUM 8.4 (L) 10/01/2022 0436   GFRNONAA >60 10/01/2022 0436   GFRAA >60 11/30/2018  1558    BNP    Component Value Date/Time   BNP 75.6 10/02/2022 0540    ProBNP No results found for: "PROBNP"  Imaging: No results found.  Administration History     None           No data to display          No results found for: "NITRICOXIDE"      Assessment & Plan:   COPD (chronic obstructive pulmonary disease) (HCC) Presumed severe COPD with recent hospitalization for exacerbation.  Will need PFTs going forward.  Congratulated on smoking cessation.  Pneumonia vaccine up-to-date.  Continue with lung cancer CT chest screening program.  On return visit consider alpha-1 testing.  Will begin LABA/LAMA maintenance inhaler with Stiolto.  Plan  Patient Instructions  Begin Stiolto 2 puffs daily  Albuterol inhaler As needed   Continue with yearly CT chest screening program.  Great job not smoking  Decrease Oxygen 2l/m  Order for POC .  Follow up with Dr. Jayme Cloud in 6-8 weeks with PFT and As needed        Chronic respiratory failure with hypoxia [facility]) New onset hypoxic and hypercarbic respiratory failure with hospitalization June 2024.  Since discharge patient is improving.  Has decreased oxygen demands.  Was able to walk in the office today on 2 L pulsed oxygen on POC device and maintain O2 saturations greater than 88 to 9%.  Order to DME company for POC.  Patient needs a more portable system as it is hard for him to carry heavy tanks with him and be more active. May decrease oxygen to 2 L.  Tobacco abuse Patient has quit smoking.  Congratulated on cessation.     Rubye Oaks, NP 11/06/2022

## 2022-11-06 NOTE — Progress Notes (Signed)
Agree with the details of the visit as noted by Rubye Oaks, NP.  Patient is being seen for post hospitalization visit.  He appears well compensated.  Gailen Shelter, MD  PCCM

## 2022-11-12 DIAGNOSIS — J9601 Acute respiratory failure with hypoxia: Secondary | ICD-10-CM | POA: Diagnosis not present

## 2022-11-12 DIAGNOSIS — I5033 Acute on chronic diastolic (congestive) heart failure: Secondary | ICD-10-CM | POA: Diagnosis not present

## 2022-11-16 ENCOUNTER — Encounter: Payer: Self-pay | Admitting: Oncology

## 2022-11-16 ENCOUNTER — Ambulatory Visit (HOSPITAL_COMMUNITY)
Admission: RE | Admit: 2022-11-16 | Discharge: 2022-11-16 | Disposition: A | Discharge status: Home / Self Care | Payer: 59 | Source: Ambulatory Visit | Attending: Vascular Surgery | Admitting: Vascular Surgery

## 2022-11-16 DIAGNOSIS — I7121 Aneurysm of the ascending aorta, without rupture: Secondary | ICD-10-CM | POA: Diagnosis not present

## 2022-11-17 NOTE — Progress Notes (Signed)
Office Note     CC: Aortoiliac aneurysmal disease Requesting Provider:  Margarita Mail, DO  HPI: Joshua Schmidt is a 72 y.o. ([birth year]) male presenting in follow-up with known aortoiliac aneurysmal disease.  On exam today, Joshua Schmidt was doing well.  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   Since last seen, Joshua Schmidt was admitted for acute respiratory failure with acute on chronic diastolic congestive heart failure, COPD exacerbation.  He is now oxygen dependent, 2 L.  States that with the oxygen, he feels 20 years younger.  He stated he felt like symptoms had been progressive over the last year.  He has not smoked since his hospitalization in June.  Regarding his aortoiliac aneurysmal disease, he denies back pain, abdominal pain, chest pain, pelvic pain. He continues to be active in his garden on a daily basis.  Vascular/venous surgical history includes bilateral venous ablation at another office.  His main concern today was continued lower extremity swelling present at the ankles. No family history of aneurysmal disease.   The pt is  on a statin for cholesterol management.  The pt is  on a daily aspirin.   Other AC:  - The pt is  on medication for hypertension.   The pt is not diabetic.  Tobacco hx:  current- cutting back   Past Medical History:  Diagnosis Date   Aneurysm (HCC)    Erythrocytosis 12/01/2018   Hyperlipidemia    Hypertension    Myocardial infarction [facility])     Past Surgical History:  Procedure Laterality Date   APPENDECTOMY     COLONOSCOPY WITH PROPOFOL N/A 11/20/2014   Procedure: COLONOSCOPY WITH PROPOFOL;  Surgeon: Kieth Brightly, MD;  Location: [facility] ENDOSCOPY;  Service: Endoscopy;  Laterality: N/A;   COLONOSCOPY WITH PROPOFOL N/A 03/19/2022   Procedure: COLONOSCOPY WITH PROPOFOL;  Surgeon: Toney Reil, MD;  Location: [facility] ENDOSCOPY;  Service: Gastroenterology;  Laterality: N/A;   TONSILLECTOMY AND ADENOIDECTOMY       Social History   Socioeconomic History   Marital status: Married    Spouse name: Not on file   Number of children: Not on file   Years of education: Not on file   Highest education level: Not on file  Occupational History   Not on file  Tobacco Use   Smoking status: Former    Current packs/day: 0.00    Average packs/day: 1 pack/day for 51.0 years (51.0 ttl pk-yrs)    Types: Cigarettes    Start date: 10/01/1971    Quit date: 10/01/2022    Years since quitting: 0.1   Smokeless tobacco: Never  Vaping Use   Vaping status: Never Used  Substance and Sexual Activity   Alcohol use: Yes   Drug use: No   Sexual activity: Not Currently  Other Topics Concern   Not on file  Social History Narrative   Not on file   Social Determinants of Health   Financial Resource Strain: Low Risk  (10/16/2022)   Overall Financial Resource Strain (CARDIA)    Difficulty of Paying Living Expenses: Not hard at all  Food Insecurity: No Food Insecurity (10/16/2022)   Hunger Vital Sign    Worried About Running Out of Food in the Last Year: Never true    Ran Out of Food in the Last Year: Never true  Transportation Needs: No Transportation Needs (10/16/2022)   PRAPARE - Administrator, Civil Service (Medical): No    Lack of Transportation (  Non-Medical): No  Physical Activity: Sufficiently Active (10/16/2022)   Exercise Vital Sign    Days of Exercise per Week: 3 days    Minutes of Exercise per Session: 90 min  Stress: No Stress Concern Present (10/16/2022)   [employer] of Occupational Health - Occupational Stress Questionnaire    Feeling of Stress : Only a little  Social Connections: Moderately Integrated (10/16/2022)   Social Connection and Isolation Panel [NHANES]    Frequency of Communication with Friends and Family: Three times a week    Frequency of Social Gatherings with Friends and Family: More than three times a week    Attends Religious Services: More than 4 times per year     Active Member of [employer] or Organizations: No    Attends Banker Meetings: Never    Marital Status: Married  Catering manager Violence: Not At Risk (10/16/2022)   Humiliation, Afraid, Rape, and Kick questionnaire    Fear of Current or Ex-Partner: No    Emotionally Abused: No    Physically Abused: No    Sexually Abused: No   No family history on file.  Current Outpatient Medications  Medication Sig Dispense Refill   albuterol (VENTOLIN HFA) 108 (90 Base) MCG/ACT inhaler Inhale 2 puffs into the lungs every 6 (six) hours as needed for wheezing or shortness of breath. 8 g 2   aspirin 81 MG chewable tablet Chew 81 mg by mouth daily.     atorvastatin (LIPITOR) 20 MG tablet TAKE 1 TABLET BY MOUTH DAILY 100 tablet 2   carvedilol (COREG) 12.5 MG tablet Take 1 tablet (12.5 mg total) by mouth 2 (two) times daily. 180 tablet 3   dextromethorphan-guaiFENesin (MUCINEX DM) 30-600 MG 12hr tablet Take 1 tablet by mouth 2 (two) times daily as needed for cough. 30 tablet 0   fluticasone (FLONASE) 50 MCG/ACT nasal spray Place 2 sprays into both nostrils daily. 16 g 6   folic acid (FOLVITE) 1 MG tablet Take 1 tablet (1 mg total) by mouth daily. (Patient not taking: Reported on 11/06/2022) 90 tablet 0   hydrochlorothiazide (HYDRODIURIL) 25 MG tablet Take 25 mg by mouth daily.     lisinopril (ZESTRIL) 20 MG tablet Take 20 mg by mouth daily.     losartan (COZAAR) 50 MG tablet Take 1 tablet (50 mg total) by mouth daily. (Patient not taking: Reported on 11/06/2022) 90 tablet 1   metoprolol tartrate (LOPRESSOR) 25 MG tablet Take 25 mg by mouth 2 (two) times daily.     Multiple Vitamin (MULTIVITAMIN WITH MINERALS) TABS tablet Take 1 tablet by mouth daily. 90 tablet 0   Tiotropium Bromide-Olodaterol (STIOLTO RESPIMAT) 2.5-2.5 MCG/ACT AERS Inhale 2 puffs into the lungs daily. 4 g 0   Tiotropium Bromide-Olodaterol (STIOLTO RESPIMAT) 2.5-2.5 MCG/ACT AERS Inhale 2 puffs into the lungs daily. 1 each 5   No current  facility-administered medications for this visit.    No Known Allergies   REVIEW OF SYSTEMS:   [X]  denotes positive finding, [ ]  denotes negative finding Cardiac  Comments:  Chest pain or chest pressure:    Shortness of breath upon exertion:    Short of breath when lying flat:    Irregular heart rhythm:        Vascular    Pain in calf, thigh, or hip brought on by ambulation:    Pain in feet at night that wakes you up from your sleep:     Blood clot in your veins:    Leg swelling:  X ankles      Pulmonary    Oxygen at home:    Productive cough:     Wheezing:         Neurologic    Sudden weakness in arms or legs:     Sudden numbness in arms or legs:     Sudden onset of difficulty speaking or slurred speech:    Temporary loss of vision in one eye:     Problems with dizziness:         Gastrointestinal    Blood in stool:     Vomited blood:         Genitourinary    Burning when urinating:     Blood in urine:        Psychiatric    Major depression:         Hematologic    Bleeding problems:    Problems with blood clotting too easily:        Skin    Rashes or ulcers:        Constitutional    Fever or chills:      PHYSICAL EXAMINATION:  There were no vitals filed for this visit.  General:  WDWN in NAD; vital signs documented above Gait: Not observed HENT: WNL, normocephalic Pulmonary: normal non-labored breathing , without wheezing Cardiac: regular HR Abdomen: soft, NT, no masses Skin: without rashes Vascular Exam/Pulses:  Right Left  Radial 2+ (normal) 2+ (normal)  Ulnar    Femoral 2+ (normal) 2+ (normal)  Popliteal    DP 2+ (normal) 2+ (normal)  PT     Extremities: without ischemic changes, without Gangrene , without cellulitis; without open wounds;  Musculoskeletal: no muscle wasting or atrophy  Neurologic: A&O X 3;  No focal weakness or paresthesias are detected Psychiatric:  The pt has Normal affect.   Non-Invasive Vascular Imaging:      Abdominal Aorta Findings:  +-------------+-------+----------+----------+--------+--------+--------+  Location    AP (cm)Trans (cm)PSV (cm/s)WaveformThrombusComments  +-------------+-------+----------+----------+--------+--------+--------+  Proximal    2.46   2.63      136                                 +-------------+-------+----------+----------+--------+--------+--------+  Mid         3.60   4.09                                          +-------------+-------+----------+----------+--------+--------+--------+  RT CIA Distal3.4    3.2                                           +-------------+-------+----------+----------+--------+--------+--------+  LT CIA Prox  1.5    1.6                                           +-------------+-------+----------+----------+--------+--------+--------+  LT CIA Distal1.9    1.8                                           +-------------+-------+----------+----------+--------+--------+--------+  ASSESSMENT/PLAN: ENRI CHUTE is a 72 y.o. male presenting with aortoiliac aneurysmal disease.  Noted iliac duplex ultrasound today demonstrated 4 cm AAA, 3.4 cm right common iliac artery aneurysm.  No significant growth since last seen..  I had a long discussion with Tom regarding the natural history of aortoiliac aneurysmal disease, as well as the treatment modalities associated.  We discussed that at his current sizes, we can continue to watch the aneurysms as the risk of rupture is less than the risk of surgery.  Should the iliac artery aneurysms reaches size greater than 3.5 cm, or the infrarenal abdominal aneurysm reaches size greater than 5.5 cm we would discuss elective repair.  Joshua Schmidt has a previous history of abdominal surgery through a midline laparotomy. From his most recent CT scan, he appears to be a candidate for endovascular approach.  We discussed what this surgery is comprised of.    He is aware  that now that he is oxygen dependent, his risk profile has increased.  Prior to pursuing surgery, I would ask for cardiology clearance.   My plan is to see, Joshua Schmidt every 6 months with aortogram iliac duplex ultrasound.  At the time of his next visit, I will also evaluate his bilateral popliteal arteries to ensure there is no aneurysmal dilatation.    We discussed the signs and symptoms of rupture, and I asked him to call 911 immediately should any of these occur.  I asked that he continue his aspirin and statin therapy.    Victorino Sparrow, MD [facility] [phone]

## 2022-11-18 ENCOUNTER — Ambulatory Visit (INDEPENDENT_AMBULATORY_CARE_PROVIDER_SITE_OTHER): Payer: 59 | Admitting: Vascular Surgery

## 2022-11-18 ENCOUNTER — Encounter: Payer: Self-pay | Admitting: Vascular Surgery

## 2022-11-18 VITALS — BP 153/92 | HR 64 | Temp 98.4°F | Resp 20 | Ht 75.0 in | Wt 223.0 lb

## 2022-11-18 DIAGNOSIS — I7143 Infrarenal abdominal aortic aneurysm, without rupture: Secondary | ICD-10-CM | POA: Diagnosis not present

## 2022-11-18 DIAGNOSIS — I723 Aneurysm of iliac artery: Secondary | ICD-10-CM

## 2022-11-18 DIAGNOSIS — I872 Venous insufficiency (chronic) (peripheral): Secondary | ICD-10-CM | POA: Diagnosis not present

## 2022-11-19 ENCOUNTER — Other Ambulatory Visit: Payer: Self-pay | Admitting: Internal Medicine

## 2022-11-19 DIAGNOSIS — I10 Essential (primary) hypertension: Secondary | ICD-10-CM

## 2022-11-19 NOTE — Progress Notes (Signed)
Established Patient Office Visit  Subjective    Patient ID: Joshua Schmidt, male    DOB: 06-06-1950  Age: 72 y.o. MRN: [id number]  CC:  Chief Complaint  Patient presents with   Follow-up   Hyperlipidemia   Hypertension    HPI Joshua Schmidt presents to follow up. Since our last office visit, he was admitted to the hospital.   Discharge Date: 10/05/22 Diagnosis: CHF exacerbation, COPD exacerbation, acute respiratory failure  Procedures/tests: BNP on admission 826, troponin levels elevated. Chest x-ray with enlarged cardiac contours with prominent bilateral interstitial opacities. CTA negative for PE, dilated ascending thoracic aorta measuring up to 4.5 cm and 5 mm left lower lobe nodule. Echo EF 60-65% Consultants: Pulmonology  New medications: discharged on 4 L supplemental oxygen, Spiriva and Albuterol  Discontinued medications: None Status: better  Hypertension: -Medications: Coreg 12.5 mg BID, HCTZ 25 mg (not taking ?), Losartan 50 mg -Patient is compliant with above medications and reports no side effects.   -Failed Meds: Lisinopril due to facial swelling -Checking BP at home (average): 132-140/80 -Denies any SOB, CP, vision changes or symptoms of hypotension - does have BLE edema -Following with Cardiology, last seen on 05/19/22  HLD/History of MI/Aortic and Iliac Aneurysms/Chronic Venous Insufficiency:   -Medications: Lipitor 20 mg, aspirin 81 mg -Patient is compliant with above medications and reports no side effects.  -History of MI in 2022 for which he did not seek medical attention at the time, was diagnosed retroactively  -CTA 7/24 with dilated ascending thoracic aorta 4.5 cm, iliac duplex US 8/24 with 4 cm AAA, 3.4 cm right common iliac artery aneurysm without significant growth  -Following with vascular surgery per note from 11/18/22 not planning elective surgery until iliac artery aneurysm is 3.5 cm, infrarenal abdominal aneurysm 5.5 cm or greater. Plan is to  evaluate every 6 months -Last lipid panel: Lipid Panel     Component Value Date/Time   CHOL 108 09/29/2022 0507   TRIG 55 09/29/2022 0507   HDL 56 09/29/2022 0507   CHOLHDL 1.9 09/29/2022 0507   VLDL 11 09/29/2022 0507   LDLCALC 41 09/29/2022 0507   LDLCALC 50 10/21/2021 1145   Erythrocytosis:  -Following with Hematology, last seen 06/10/22 -Has labs monthly, sometimes requires phlebotomy but hasn't had to have one in 2 months.  -Last CBC 7/24 hgb 17.3  COPD: -COPD status: stable -Current medications: Now on Stiolto, Albuterol   -Oxygen use: yes - was discharged on 4 L, decreased to 2 L and doing well  -Dyspnea frequency: Occasional  -Cough frequency: Daily, productive  -Limitation of activity: no -Pneumovax: Up to Date -Influenza: Up to Date -Annual lung cancer screening: 10/23 Lung-RADS-2  -Stopped smoking since hospitalization -Now following with Pulmonology, last seen 11/06/22, planning on PFT's  PTSD: -Not currently on medication -Had done counseling in the past but not interested it anything now  Health Maintance: -Blood work due -Colon cancer screening: colonoscopy 12/23, 3 polyps removed, plan tto follow up in 1 year   Outpatient Encounter Medications as of 11/20/2022  Medication Sig   albuterol (VENTOLIN HFA) 108 (90 Base) MCG/ACT inhaler Inhale 2 puffs into the lungs every 6 (six) hours as needed for wheezing or shortness of breath.   aspirin 81 MG chewable tablet Chew 81 mg by mouth daily.   atorvastatin (LIPITOR) 20 MG tablet TAKE 1 TABLET BY MOUTH DAILY   carvedilol (COREG) 12.5 MG tablet Take 1 tablet (12.5 mg total) by mouth 2 (two) times daily.  dextromethorphan-guaiFENesin (MUCINEX DM) 30-600 MG 12hr tablet Take 1 tablet by mouth 2 (two) times daily as needed for cough.   fluticasone (FLONASE) 50 MCG/ACT nasal spray Place 2 sprays into both nostrils daily.   folic acid (FOLVITE) 1 MG tablet Take 1 tablet (1 mg total) by mouth daily.   hydrochlorothiazide  (HYDRODIURIL) 25 MG tablet Take 25 mg by mouth daily.   lisinopril (ZESTRIL) 20 MG tablet Take 20 mg by mouth daily.   losartan (COZAAR) 50 MG tablet Take 1 tablet (50 mg total) by mouth daily.   metoprolol tartrate (LOPRESSOR) 25 MG tablet Take 25 mg by mouth 2 (two) times daily.   Multiple Vitamin (MULTIVITAMIN WITH MINERALS) TABS tablet Take 1 tablet by mouth daily.   Tiotropium Bromide-Olodaterol (STIOLTO RESPIMAT) 2.5-2.5 MCG/ACT AERS Inhale 2 puffs into the lungs daily.   Tiotropium Bromide-Olodaterol (STIOLTO RESPIMAT) 2.5-2.5 MCG/ACT AERS Inhale 2 puffs into the lungs daily.   No facility-administered encounter medications on file as of 11/20/2022.    Past Medical History:  Diagnosis Date   Aneurysm (HCC)    Erythrocytosis 12/01/2018   Hyperlipidemia    Hypertension    Myocardial infarction [facility])     Past Surgical History:  Procedure Laterality Date   APPENDECTOMY     COLONOSCOPY WITH PROPOFOL N/A 11/20/2014   Procedure: COLONOSCOPY WITH PROPOFOL;  Surgeon: Kieth Brightly, MD;  Location: [facility] ENDOSCOPY;  Service: Endoscopy;  Laterality: N/A;   COLONOSCOPY WITH PROPOFOL N/A 03/19/2022   Procedure: COLONOSCOPY WITH PROPOFOL;  Surgeon: Toney Reil, MD;  [facility] END[facility]COPY;  Service: Gastroenterology;  Laterality: N/A;   TONSILLECTOMY AND ADENOIDECTOMY      No family history on file.  Social History   Socioeconomic History   Marital status: Married    Spouse name: Not on file   Number of children: Not on file   Years of education: Not on file   Highest education level: Not on file  Occupational History   Not on file  Tobacco Use   Smoking status: Former    Current packs/day: 0.00    Average packs/day: 1 pack/day for 51.0 years (51.0 ttl pk-yrs)    Types: Cigarettes    Start date: 10/01/1971    Quit date: 10/01/2022    Years since quitting: 0.1   Smokeless tobacco: Never  Vaping Use   Vaping status: Never Used  Substance and Sexual Activity    Alcohol use: Yes   Drug use: No   Sexual activity: Not Currently  Other Topics Concern   Not on file  Social History Narrative   Not on file   Social Determinants of Health   Financial Resource Strain: Low Risk  (10/16/2022)   Overall Financial Resource Strain (CARDIA)    Difficulty of Paying Living Expenses: Not hard at all  Food Insecurity: No Food Insecurity (10/16/2022)   Hunger Vital Sign    Worried About Running Out of Food in the Last Year: Never true    Ran Out of Food in the Last Year: Never true  Transportation Needs: No Transportation Needs (10/16/2022)   PRAPARE - Administrator, Civil Service (Medical): No    Lack of Transportation (Non-Medical): No  Physical Activity: Sufficiently Active (10/16/2022)   Exercise Vital Sign    Days of Exercise per Week: 3 days    Minutes of Exercise per Session: 90 min  Stress: No Stress Concern Present (10/16/2022)   Joshua Schmidt of Occupational Health - Occupational Stress Questionnaire  Feeling of Stress : Only a little  Social Connections: Moderately Integrated (10/16/2022)   Social Connection and Isolation Panel [NHANES]    Frequency of Communication with Friends and Family: Three times a week    Frequency of Social Gatherings with Friends and Family: More than three times a week    Attends Religious Services: More than 4 times per year    Active Member of [employer] or Organizations: No    Attends Banker Meetings: Never    Marital Status: Married  Catering manager Violence: Not At Risk (10/16/2022)   Humiliation, Afraid, Rape, and Kick questionnaire    Fear of Current or Ex-Partner: No    Emotionally Abused: No    Physically Abused: No    Sexually Abused: No    Review of Systems  Constitutional:  Negative for chills and fever.  Eyes:  Negative for blurred vision.  Respiratory:  Negative for cough, shortness of breath and wheezing.   Cardiovascular:  Positive for leg swelling. Negative for chest pain and  palpitations.  Gastrointestinal:  Negative for abdominal pain.  Neurological:  Negative for dizziness and headaches.        Objective    BP 136/88   Pulse 67   Temp 98.1 F (36.7 C) (Oral)   Resp 16   Ht 6\' 3"  (1.905 m)   Wt 226 lb (102.5 kg)   SpO2 (!) 86% Comment: O2 2L  BMI 28.25 kg/m   Physical Exam Constitutional:      Appearance: Normal appearance.  HENT:     Head: Normocephalic and atraumatic.  Eyes:     Conjunctiva/sclera: Conjunctivae normal.  Cardiovascular:     Rate and Rhythm: Normal rate and regular rhythm.  Pulmonary:     Effort: Pulmonary effort is normal.     Breath sounds: Normal breath sounds. No wheezing, rhonchi or rales.     Comments: On 2 L [place] Musculoskeletal:     Right lower leg: Edema present.     Left lower leg: Edema present.     Comments: 2+ BLE pitting edema  Skin:    General: Skin is warm and dry.  Neurological:     General: No focal deficit present.     Mental Status: He is alert. Mental status is at baseline.  Psychiatric:        Mood and Affect: Mood normal.        Behavior: Behavior normal.     Last CBC Lab Results  Component Value Date   WBC 11.5 (H) 10/02/2022   HGB 15.3 10/09/2022   HCT 50.9 10/09/2022   MCV 93.2 10/02/2022   MCH 28.7 10/02/2022   RDW 16.3 (H) 10/02/2022   PLT 215 10/02/2022   Last metabolic panel Lab Results  Component Value Date   GLUCOSE 123 (H) 10/01/2022   NA 137 10/01/2022   K 3.9 10/01/2022   CL 85 (L) 10/01/2022   CO2 41 (H) 10/01/2022   BUN 21 10/01/2022   CREATININE 0.83 10/01/2022   GFRNONAA >60 10/01/2022   CALCIUM 8.4 (L) 10/01/2022   PROT 7.3 01/22/2022   ALBUMIN 3.6 01/22/2022   BILITOT 0.6 01/22/2022   ALKPHOS 58 01/22/2022   AST 24 01/22/2022   ALT 20 01/22/2022   ANIONGAP 11 10/01/2022   Last lipids Lab Results  Component Value Date   CHOL 108 09/29/2022   HDL 56 09/29/2022   LDLCALC 41 09/29/2022   TRIG 55 09/29/2022   CHOLHDL 1.9 09/29/2022   Last  hemoglobin A1c Lab Results  Component Value Date   HGBA1C 5.4 09/28/2022   Last thyroid functions Lab Results  Component Value Date   TSH 0.69 10/21/2021   Last vitamin D No results found for: "25OHVITD2", "25OHVITD3", "VD25OH" Last vitamin B12 and Folate No results found for: "VITAMINB12", "FOLATE"      Assessment & Plan:   1. Hypertension, unspecified type: Blood pressure stable, patient currently not taking hydrochlorothiazide, uncertain why. Is currently on Losartan 50, Coreg 12.5 mg BID. Having BLE swelling, discussed elevation of legs, compression stockings, decreasing sodium. Can take hydrochlorothiazide as needed if swelling is worse, recheck in 1 month. Recheck labs today, refills ordered.   - CBC w/Diff/Platelet - COMPLETE METABOLIC PANEL WITH GFR - carvedilol (COREG) 12.5 MG tablet; Take 1 tablet (12.5 mg total) by mouth 2 (two) times daily.  Dispense: 180 tablet; Refill: 3 - losartan (COZAAR) 50 MG tablet; Take 1 tablet (50 mg total) by mouth daily.  Dispense: 90 tablet; Refill: 1  2. Chronic venous insufficiency/Mixed hyperlipidemia: Was recently seen by vascular, note reviewed from 11/18/22. Continue Lipitor 20 mg.  - atorvastatin (LIPITOR) 20 MG tablet; Take 1 tablet (20 mg total) by mouth daily.  Dispense: 100 tablet; Refill: 2  3. Chronic obstructive pulmonary disease, unspecified COPD type (HCC)/Acute respiratory failure with hypoxia (HCC): Much improved, now on 2 L [place] and Stiolto maintenance inhaler. Refill Albuterol.   - albuterol (VENTOLIN HFA) 108 (90 Base) MCG/ACT inhaler; Inhale 2 puffs into the lungs every 6 (six) hours as needed for wheezing or shortness of breath.  Dispense: 8 g; Refill: 2  4. Folate deficiency: Started on folic acid in the hospital, recheck levels.   - B12 and Folate Panel   Return in about 4 weeks (around 12/18/2022).   Margarita Mail, DO

## 2022-11-20 ENCOUNTER — Encounter: Payer: Self-pay | Admitting: Internal Medicine

## 2022-11-20 ENCOUNTER — Ambulatory Visit: Payer: 59 | Admitting: Internal Medicine

## 2022-11-20 ENCOUNTER — Ambulatory Visit (INDEPENDENT_AMBULATORY_CARE_PROVIDER_SITE_OTHER): Payer: 59 | Admitting: Internal Medicine

## 2022-11-20 VITALS — BP 136/88 | HR 67 | Temp 98.1°F | Resp 16 | Ht 75.0 in | Wt 226.0 lb

## 2022-11-20 DIAGNOSIS — E782 Mixed hyperlipidemia: Secondary | ICD-10-CM | POA: Diagnosis not present

## 2022-11-20 DIAGNOSIS — J9601 Acute respiratory failure with hypoxia: Secondary | ICD-10-CM | POA: Diagnosis not present

## 2022-11-20 DIAGNOSIS — E538 Deficiency of other specified B group vitamins: Secondary | ICD-10-CM

## 2022-11-20 DIAGNOSIS — I10 Essential (primary) hypertension: Secondary | ICD-10-CM | POA: Diagnosis not present

## 2022-11-20 DIAGNOSIS — J449 Chronic obstructive pulmonary disease, unspecified: Secondary | ICD-10-CM | POA: Diagnosis not present

## 2022-11-20 DIAGNOSIS — I872 Venous insufficiency (chronic) (peripheral): Secondary | ICD-10-CM

## 2022-11-20 LAB — CBC WITH DIFFERENTIAL/PLATELET
Absolute Monocytes: 632 cells/uL (ref 200–950)
Basophils Absolute: 47 cells/uL (ref 0–200)
Basophils Relative: 0.6 %
Eosinophils Absolute: 403 cells/uL (ref 15–500)
Eosinophils Relative: 5.1 %
HCT: 38.7 % (ref 38.5–50.0)
Hemoglobin: 12.3 g/dL — ABNORMAL LOW (ref 13.2–17.1)
Lymphs Abs: 869 cells/uL (ref 850–3900)
MCH: 28.9 pg (ref 27.0–33.0)
MCHC: 31.8 g/dL — ABNORMAL LOW (ref 32.0–36.0)
MCV: 90.8 fL (ref 80.0–100.0)
MPV: 9.6 fL (ref 7.5–12.5)
Monocytes Relative: 8 %
Neutro Abs: 5949 cells/uL (ref 1500–7800)
Neutrophils Relative %: 75.3 %
Platelets: 210 10*3/uL (ref 140–400)
RBC: 4.26 10*6/uL (ref 4.20–5.80)
RDW: 17.8 % — ABNORMAL HIGH (ref 11.0–15.0)
Total Lymphocyte: 11 %
WBC: 7.9 10*3/uL (ref 3.8–10.8)

## 2022-11-20 MED ORDER — LOSARTAN POTASSIUM 50 MG PO TABS
50.0000 mg | ORAL_TABLET | Freq: Every day | ORAL | 1 refills | Status: DC
Start: 2022-11-20 — End: 2023-03-15

## 2022-11-20 MED ORDER — ATORVASTATIN CALCIUM 20 MG PO TABS
20.0000 mg | ORAL_TABLET | Freq: Every day | ORAL | 2 refills | Status: DC
Start: 2022-11-20 — End: 2023-07-26

## 2022-11-20 MED ORDER — ALBUTEROL SULFATE HFA 108 (90 BASE) MCG/ACT IN AERS
2.0000 | INHALATION_SPRAY | Freq: Four times a day (QID) | RESPIRATORY_TRACT | 2 refills | Status: DC | PRN
Start: 2022-11-20 — End: 2023-07-08

## 2022-11-20 MED ORDER — CARVEDILOL 12.5 MG PO TABS: 12.5000 mg | ORAL_TABLET | Freq: Two times a day (BID) | ORAL | 3 refills | Status: DC

## 2022-11-20 NOTE — Telephone Encounter (Signed)
Requested Prescriptions  Refused Prescriptions Disp Refills   losartan (COZAAR) 50 MG tablet [Pharmacy Med Name: Losartan Potassium 50 MG Oral Tablet] 80 tablet 3    Sig: TAKE 1 TABLET BY MOUTH DAILY     Cardiovascular:  Angiotensin Receptor Blockers Failed - 11/19/2022 12:19 PM      Failed - Last BP in normal range    BP Readings from Last 1 Encounters:  11/20/22 136/88         Passed - Cr in normal range and within 180 days    Creat  Date Value Ref Range Status  10/21/2021 0.78 0.70 - 1.28 mg/dL Final   Creatinine, Ser  Date Value Ref Range Status  10/01/2022 0.83 0.61 - 1.24 mg/dL Final         Passed - K in normal range and within 180 days    Potassium  Date Value Ref Range Status  10/01/2022 3.9 3.5 - 5.1 mmol/L Final         Passed - Patient is not pregnant      Passed - Valid encounter within last 6 months    Recent Outpatient Visits           Today Hypertension, unspecified type   [facility] Margarita Mail, DO   1 month ago Acute respiratory failure with hypoxia [facility])   [facility] Margarita Mail, DO   4 months ago Hypertension, unspecified type   [facility] Margarita Mail, DO   5 months ago Angioedema, initial encounter   [facility] Margarita Mail, DO   6 months ago Hypertension, unspecified type   [facility] Margarita Mail, DO       Future Appointments             In 4 weeks Margarita Mail, DO [place] [facility]   In 3 months Kirke Corin, Chelsea Aus, MD [facility]

## 2022-11-20 NOTE — Patient Instructions (Signed)
It was great seeing you today!  Plan discussed at today's visit: -Blood work ordered today, results will be uploaded to MyChart.  -Medications refilled, hold off on hydrochlorothiazide for now unless swelling in legs gets worse   Follow up in: 1 month  Take care and let us know if you have any questions or concerns prior to your next visit.  Dr. Caralee Ates  Edema  Edema is when you have too much fluid in your body or under your skin. Edema may make your legs, feet, and ankles swell. Swelling often happens in looser tissues, such as around your eyes. This is a common condition. It gets more common as you get older. There are many possible causes of edema. These include: Eating too much salt (sodium). Being on your feet or sitting for a long time. Certain medical conditions, such as: Pregnancy. Heart failure. Liver disease. Kidney disease. Cancer. Hot weather may make edema worse. Edema is usually painless. Your skin may look swollen or shiny. Follow these instructions at home: Medicines Take over-the-counter and prescription medicines only as told by your doctor. Your doctor may prescribe a medicine to help your body get rid of extra water (diuretic). Take this medicine if you are told to take it. Eating and drinking Eat a low-salt (low-sodium) diet as told by your doctor. Sometimes, eating less salt may reduce swelling. Depending on the cause of your swelling, you may need to limit how much fluid you drink (fluid restriction). General instructions Raise the injured area above the level of your heart while you are sitting or lying down. Do not sit still or stand for a long time. Do not wear tight clothes. Do not wear garters on your upper legs. Exercise your legs. This can help the swelling go down. Wear compression stockings as told by your doctor. It is important that these are the right size. These should be prescribed by your doctor to prevent possible injuries. If elastic  bandages or wraps are recommended, use them as told by your doctor. Contact a doctor if: Treatment is not working. You have heart, liver, or kidney disease and have symptoms of edema. You have sudden and unexplained weight gain. Get help right away if: You have shortness of breath or chest pain. You cannot breathe when you lie down. You have pain, redness, or warmth in the swollen areas. You have heart, liver, or kidney disease and get edema all of a sudden. You have a fever and your symptoms get worse all of a sudden. These symptoms may be an emergency. Get help right away. Call 911. Do not wait to see if the symptoms will go away. Do not drive yourself to the hospital. Summary Edema is when you have too much fluid in your body or under your skin. Edema may make your legs, feet, and ankles swell. Swelling often happens in looser tissues, such as around your eyes. Raise the injured area above the level of your heart while you are sitting or lying down. Follow your doctor's instructions about diet and how much fluid you can drink. This information is not intended to replace advice given to you by your health care provider. Make sure you discuss any questions you have with your health care provider. Document Revised: 12/02/2020 Document Reviewed: 12/02/2020 Elsevier Patient Education  2024 [employer].

## 2022-11-23 DIAGNOSIS — J441 Chronic obstructive pulmonary disease with (acute) exacerbation: Secondary | ICD-10-CM | POA: Diagnosis not present

## 2022-11-23 DIAGNOSIS — J9601 Acute respiratory failure with hypoxia: Secondary | ICD-10-CM | POA: Diagnosis not present

## 2022-11-23 DIAGNOSIS — I5033 Acute on chronic diastolic (congestive) heart failure: Secondary | ICD-10-CM | POA: Diagnosis not present

## 2022-11-25 ENCOUNTER — Other Ambulatory Visit: Payer: Self-pay

## 2022-11-25 DIAGNOSIS — I739 Peripheral vascular disease, unspecified: Secondary | ICD-10-CM

## 2022-11-25 DIAGNOSIS — R0989 Other specified symptoms and signs involving the circulatory and respiratory systems: Secondary | ICD-10-CM

## 2022-11-25 DIAGNOSIS — I7143 Infrarenal abdominal aortic aneurysm, without rupture: Secondary | ICD-10-CM

## 2022-11-25 DIAGNOSIS — I723 Aneurysm of iliac artery: Secondary | ICD-10-CM

## 2022-11-26 ENCOUNTER — Ambulatory Visit: Payer: 59 | Attending: Adult Health

## 2022-11-27 ENCOUNTER — Ambulatory Visit: Payer: 59 | Admitting: Vascular Surgery

## 2022-11-27 ENCOUNTER — Other Ambulatory Visit (HOSPITAL_COMMUNITY): Payer: 59

## 2022-12-15 ENCOUNTER — Encounter: Payer: Self-pay | Admitting: Pulmonary Disease

## 2022-12-15 ENCOUNTER — Ambulatory Visit (INDEPENDENT_AMBULATORY_CARE_PROVIDER_SITE_OTHER): Payer: 59 | Admitting: Pulmonary Disease

## 2022-12-15 VITALS — BP 118/80 | HR 60 | Temp 98.2°F | Ht 75.0 in | Wt 225.6 lb

## 2022-12-15 DIAGNOSIS — I5032 Chronic diastolic (congestive) heart failure: Secondary | ICD-10-CM

## 2022-12-15 DIAGNOSIS — J449 Chronic obstructive pulmonary disease, unspecified: Secondary | ICD-10-CM

## 2022-12-15 DIAGNOSIS — J9612 Chronic respiratory failure with hypercapnia: Secondary | ICD-10-CM | POA: Diagnosis not present

## 2022-12-15 DIAGNOSIS — Z87891 Personal history of nicotine dependence: Secondary | ICD-10-CM | POA: Diagnosis not present

## 2022-12-15 DIAGNOSIS — J9611 Chronic respiratory failure with hypoxia: Secondary | ICD-10-CM

## 2022-12-15 NOTE — Patient Instructions (Signed)
We will get your breathing tests ordered.  Continue using your Stiolto 2 puffs once a day.  You may use your rescue inhaler if needed.  We will see you in follow-up in 3 months time call sooner should any new problems arise.

## 2022-12-15 NOTE — Progress Notes (Signed)
Subjective:    Patient ID: Joshua Schmidt, male    DOB: 1950-11-19, 72 y.o.   MRN: [id number]  Patient Care Team: Margarita Mail, DO as PCP - General (Internal Medicine) Iran Ouch, MD as PCP - Cardiology (Cardiology) Rickard Patience, MD as Consulting Physician (Oncology)  Chief Complaint  Patient presents with   Follow-up    NO SOB or wheezing. Occasional cough.    PATIENT PROFILE:  72 year old male former smoker(quit  09/2022) seen for pulmonary consult during hospitalization June 2024 for COPD exacerbation and acute hypoxic/hypercarbic respiratory failure and Diastolic CHF decompensation.  Participates in the lung cancer CT chest screening program, due 25 October. Medical history significant for DVT, alcohol abuse, erythrocytosis followed by hematology. 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 Quit smoking June 2024.   DATA 10/02/2022: 2D echo EF 60-65%, right ventricular size normal right ventricular systolic function normal 09/29/2022: Venous Dopplers: negative 09/28/2022: CT chest -neg PE , 5 mm left lower lobe nodule, dilated ascending thoracic aorta 4.5 cm. 10/02/2022: ABG pH 7.45, pCO2 78, pO2 69, bicarb 54  INTERVAL Patient was seen in follow-up posthospital admission on 06 November 2022 by Rubye Oaks, NP.  The patient was started at the time on Stiolto 2 inhalations daily.  He was continued on oxygen at 2 L/min.  He had no active complaints at that time.  HPI Patient presents for follow-up on severe COPD by clinical impression and chronic respiratory failure with hypoxia and hypercarbia.  He he is compliant with oxygen at 2 L/min.  He continues to abstain from tobacco use.  He notes that oxygen is very beneficial to him.  He has not had to use albuterol as rescue since his prior visit.  No orthopnea or paroxysmal nocturnal dyspnea.  He does have lower extremity edema towards the end of the day.  No calf tenderness.   Follows with cardiology and is consistent with follow-ups.  He was scheduled to have pulmonary function testing during his last visit however he missed the appointment.  This will be rescheduled today.  I advised him that this study is important to establish baseline of his pulmonary function.  He has not had any fevers, chills or sweats.  No cough or sputum production.  No other symptomatology.  Overall he feels well.   Review of Systems A 10 point review of systems was performed and it is as noted above otherwise negative.   Patient Active Problem List   Diagnosis Date Noted   COPD (chronic obstructive pulmonary disease) (HCC) 11/06/2022   Chronic respiratory failure with hypoxia (HCC) 11/06/2022   Volume overload state of heart 10/05/2022   Acute on chronic diastolic CHF (congestive heart failure) (HCC) 09/28/2022   Lung nodule 09/28/2022   Myocardial injury 09/28/2022   Acute respiratory failure with hypoxia (HCC) 09/28/2022   HLD (hyperlipidemia) 09/28/2022   HTN (hypertension) 09/28/2022   COPD exacerbation (HCC) 09/28/2022   CAD (coronary artery disease) 09/28/2022   DVT (deep venous thrombosis) (HCC) 09/28/2022   Sleep apnea 06/10/2022   History of adenomatous polyp of colon 03/19/2022   Adenomatous polyp of descending colon 03/19/2022   Traumatic tear of left rotator cuff 02/09/2022   Aneurysm of ascending aorta without rupture (HCC) 02/09/2022   Iron deficiency 10/22/2021   Varicose veins with pain 05/04/2021   Chronic venous insufficiency 05/04/2021   Acute deep vein thrombosis (DVT) of right lower extremity (HCC) 12/03/2020   Encounter for annual physical exam 09/06/2020  Alcohol abuse 09/06/2020   Bruit of left carotid artery 08/30/2020   Erythrocytosis 12/01/2018   Tobacco abuse 10/27/2018   Sebaceous cyst 11/08/2015    Social History   Tobacco Use   Smoking status: Former    Current packs/day: 0.00    Average packs/day: 1 pack/day for 51.0 years (51.0 ttl  pk-yrs)    Types: Cigarettes    Start date: 10/01/1971    Quit date: 10/01/2022    Years since quitting: 0.2   Smokeless tobacco: Never  Substance Use Topics   Alcohol use: Yes    No Known Allergies  Current Meds  Medication Sig   albuterol (VENTOLIN HFA) 108 (90 Base) MCG/ACT inhaler Inhale 2 puffs into the lungs every 6 (six) hours as needed for wheezing or shortness of breath.   aspirin 81 MG chewable tablet Chew 81 mg by mouth daily.   atorvastatin (LIPITOR) 20 MG tablet Take 1 tablet (20 mg total) by mouth daily.   carvedilol (COREG) 12.5 MG tablet Take 1 tablet (12.5 mg total) by mouth 2 (two) times daily.   dextromethorphan-guaiFENesin (MUCINEX DM) 30-600 MG 12hr tablet Take 1 tablet by mouth 2 (two) times daily as needed for cough.   fluticasone (FLONASE) 50 MCG/ACT nasal spray Place 2 sprays into both nostrils daily.   folic acid (FOLVITE) 1 MG tablet Take 1 tablet (1 mg total) by mouth daily.   hydrochlorothiazide (HYDRODIURIL) 25 MG tablet Take 25 mg by mouth daily.   losartan (COZAAR) 50 MG tablet Take 1 tablet (50 mg total) by mouth daily.   Multiple Vitamin (MULTIVITAMIN WITH MINERALS) TABS tablet Take 1 tablet by mouth daily.   Tiotropium Bromide-Olodaterol (STIOLTO RESPIMAT) 2.5-2.5 MCG/ACT AERS Inhale 2 puffs into the lungs daily.    Immunization History  Administered Date(s) Administered   Fluad Quad(high Dose 65+) 01/13/2022   Influenza, High Dose Seasonal PF 01/12/2018   Influenza-Unspecified 01/06/2021   PFIZER(Purple Top)SARS-COV-2 Vaccination 06/03/2019, 06/27/2019, 01/11/2020, 07/26/2020   PNEUMOCOCCAL CONJUGATE-20 02/10/2021   Pfizer Covid-19 Vaccine Bivalent Booster 5y-11y 01/06/2021   Tdap 02/10/2021   Zoster Recombinant(Shingrix) 05/10/2014        Objective:     BP 118/80 (BP Location: Right Arm, Cuff Size: Normal)   Pulse 60   Temp 98.2 F (36.8 C)   Ht 6\' 3"  (1.905 m)   Wt 225 lb 9.6 oz (102.3 kg)   SpO2 90%   BMI 28.20 kg/m   SpO2: 90  % O2 Device: Nasal cannula O2 Flow Rate (L/min): 2 L/min O2 Type: Pulse O2  GENERAL: Well-developed, well-nourished gentleman, in no acute distress.  Comfortable with nasal cannula O2 in place.  No conversational dyspnea HEAD: Normocephalic, atraumatic.  EYES: Pupils equal, round, reactive to light.  No scleral icterus.  MOUTH: Poor dentition, oral mucosa moist.  No thrush. NECK: Supple. No thyromegaly. Trachea midline. No JVD.  No adenopathy. PULMONARY: Good air entry bilaterally.  Coarse, no adventitious sounds otherwise. CARDIOVASCULAR: S1 and S2. Regular rate and rhythm.  No rubs, murmurs or gallops heard. ABDOMEN: Benign. MUSCULOSKELETAL: No joint deformity, no clubbing, there is 1+ edema at the level of the ankles.  NEUROLOGIC: No overt focal deficit, gait not tested, speech is fluent. SKIN: Intact,warm,dry.  Chronic stasis changes noted. PSYCH: Mood and behavior normal.   Assessment & Plan:     ICD-10-CM   1. Chronic obstructive pulmonary disease, unspecified COPD type (HCC)  J44.9    Need to assess function with PFTs Reschedule PFTs Continue Stiolto 2 inhalations daily  Continue as needed albuterol    2. Chronic respiratory failure with hypoxia and hypercapnia (HCC)  J96.11    J96.12    Continue oxygen at 2 L/min Patient compliant with therapy Notes benefit of therapy    3. Chronic diastolic heart failure (HCC)  V40.98    This issue adds complexity to his management Follows with cardiology    4. Former heavy tobacco smoker  Z87.891    No evidence of relapse Enrolled in lung cancer screening program     Patient had PFTs rescheduled today.  We will see the patient in follow-up in 3 months time he is to contact us prior to that time should any new difficulties arise.   Gailen Shelter, MD Advanced Bronchoscopy PCCM [facility]    *This note was dictated using voice recognition software/Dragon.  Despite best efforts to proofread, errors can  occur which can change the meaning. Any transcriptional errors that result from this process are unintentional and may not be fully corrected at the time of dictation.

## 2022-12-17 NOTE — Progress Notes (Signed)
Established Patient Office Visit  Subjective    Patient ID: JOESEPH Schmidt, male    DOB: 22-Jan-1951  Age: 72 y.o. MRN: [id number]  CC:  Chief Complaint  Patient presents with   Follow-up    HPI Joshua Schmidt presents to follow up. Patient has been doing well since our last visit.  Hypertension: -Medications: Coreg 12.5 mg BID, HCTZ 25 mg, Losartan 50 mg -Patient is compliant with above medications and reports no side effects.   -Failed Meds: Lisinopril due to facial swelling -Checking BP at home (average): 132-140/80 -Denies any SOB, CP, vision changes or symptoms of hypotension - does have BLE edema but fairly well controlled  -Following with Cardiology  HLD/History of MI/Aortic and Iliac Aneurysms/Chronic Venous Insufficiency:   -Medications: Lipitor 20 mg, aspirin 81 mg -Patient is compliant with above medications and reports no side effects.  -History of MI in 2022 for which he did not seek medical attention at the time, was diagnosed retroactively  -CTA 7/24 with dilated ascending thoracic aorta 4.5 cm, iliac duplex US 8/24 with 4 cm AAA, 3.4 cm right common iliac artery aneurysm without significant growth  -Following with vascular surgery per note from 11/18/22 not planning elective surgery until iliac artery aneurysm is 3.5 cm, infrarenal abdominal aneurysm 5.5 cm or greater. Plan is to evaluate every 6 months -Last lipid panel: Lipid Panel     Component Value Date/Time   CHOL 108 09/29/2022 0507   TRIG 55 09/29/2022 0507   HDL 56 09/29/2022 0507   CHOLHDL 1.9 09/29/2022 0507   VLDL 11 09/29/2022 0507   LDLCALC 41 09/29/2022 0507   LDLCALC 50 10/21/2021 1145   Erythrocytosis:  -Following with Hematology, last seen 06/10/22 -Has labs monthly, sometimes requires phlebotomy but hasn't had to have in awhile  -CBC 7/24 hgb 17.3 , 12.3 8/24 after hospitalization -Has appointment scheduled in October   COPD: -COPD status: stable -Current medications: Now on Stiolto,  Albuterol   -Oxygen use: yes - was discharged on 4 L, decreased to 2 L and doing well  -Dyspnea frequency: Occasional  -Cough frequency: Daily, productive  -Limitation of activity: no -Pneumovax: Up to Date -Influenza: Up to Date -Annual lung cancer screening: 10/23 Lung-RADS-2  -Stopped smoking since hospitalization -Now following with Pulmonology, last seen 12/15/22  PTSD: -Not currently on medication -Had done counseling in the past but not interested it anything now  Health Maintance: -Blood work UTD -Colon cancer screening: colonoscopy 12/23, 3 polyps removed, plan tto follow up in 1 year  -Just had flu and RSP vaccines in August  Outpatient Encounter Medications as of 12/18/2022  Medication Sig   albuterol (VENTOLIN HFA) 108 (90 Base) MCG/ACT inhaler Inhale 2 puffs into the lungs every 6 (six) hours as needed for wheezing or shortness of breath.   aspirin 81 MG chewable tablet Chew 81 mg by mouth daily.   atorvastatin (LIPITOR) 20 MG tablet Take 1 tablet (20 mg total) by mouth daily.   carvedilol (COREG) 12.5 MG tablet Take 1 tablet (12.5 mg total) by mouth 2 (two) times daily.   dextromethorphan-guaiFENesin (MUCINEX DM) 30-600 MG 12hr tablet Take 1 tablet by mouth 2 (two) times daily as needed for cough.   fluticasone (FLONASE) 50 MCG/ACT nasal spray Place 2 sprays into both nostrils daily.   folic acid (FOLVITE) 1 MG tablet Take 1 tablet (1 mg total) by mouth daily.   hydrochlorothiazide (HYDRODIURIL) 25 MG tablet Take 25 mg by mouth daily.   losartan (COZAAR)  50 MG tablet Take 1 tablet (50 mg total) by mouth daily.   Multiple Vitamin (MULTIVITAMIN WITH MINERALS) TABS tablet Take 1 tablet by mouth daily.   Tiotropium Bromide-Olodaterol (STIOLTO RESPIMAT) 2.5-2.5 MCG/ACT AERS Inhale 2 puffs into the lungs daily.   Tiotropium Bromide-Olodaterol (STIOLTO RESPIMAT) 2.5-2.5 MCG/ACT AERS Inhale 2 puffs into the lungs daily.   No facility-administered encounter medications on file as  of 12/18/2022.    Past Medical History:  Diagnosis Date   Aneurysm (HCC)    Erythrocytosis 12/01/2018   Hyperlipidemia    Hypertension    Myocardial infarction [facility])     Past Surgical History:  Procedure Laterality Date   APPENDECTOMY     COLONOSCOPY WITH PROPOFOL N/A 11/20/2014   Procedure: COLONOSCOPY WITH PROPOFOL;  Surgeon: Kieth Brightly, MD;  Location: [facility] ENDOSCOPY;  Service: Endoscopy;  Laterality: N/A;   COLONOSCOPY WITH PROPOFOL N/A 03/19/2022   Procedure: COLONOSCOPY WITH PROPOFOL;  Surgeon: Toney Reil, MD;  Location: [facility] ENDOSCOPY;  Service: Gastroenterology;  Laterality: N/A;   TONSILLECTOMY AND ADENOIDECTOMY      History reviewed. No pertinent family history.  Social History   Socioeconomic History   Marital status: Married    Spouse name: Not on file   Number of children: Not on file   Years of education: Not on file   Highest education level: Not on file  Occupational History   Not on file  Tobacco Use   Smoking status: Former    Current packs/day: 0.00    Average packs/day: 1 pack/day for 51.0 years (51.0 ttl pk-yrs)    Types: Cigarettes    Start date: 10/01/1971    Quit date: 10/01/2022    Years since quitting: 0.2   Smokeless tobacco: Never  Vaping Use   Vaping status: Never Used  Substance and Sexual Activity   Alcohol use: Yes   Drug use: No   Sexual activity: Not Currently  Other Topics Concern   Not on file  Social History Narrative   Not on file   Social Determinants of Health   Financial Resource Strain: Low Risk  (10/16/2022)   Overall Financial Resource Strain (CARDIA)    Difficulty of Paying Living Expenses: Not hard at all  Food Insecurity: No Food Insecurity (10/16/2022)   Hunger Vital Sign    Worried About Running Out of Food in the Last Year: Never true    Ran Out of Food in the Last Year: Never true  Transportation Needs: No Transportation Needs (10/16/2022)   PRAPARE - Administrator, Civil Service  (Medical): No    Lack of Transportation (Non-Medical): No  Physical Activity: Sufficiently Active (10/16/2022)   Exercise Vital Sign    Days of Exercise per Week: 3 days    Minutes of Exercise per Session: 90 min  Stress: No Stress Concern Present (10/16/2022)   [employer] of Occupational Health - Occupational Stress Questionnaire    Feeling of Stress : Only a little  Social Connections: Moderately Integrated (10/16/2022)   Social Connection and Isolation Panel [NHANES]    Frequency of Communication with Friends and Family: Three times a week    Frequency of Social Gatherings with Friends and Family: More than three times a week    Attends Religious Services: More than 4 times per year    Active Member of Clubs or Organizations: No    Attends Banker Meetings: Never    Marital Status: Married  Catering manager Violence: Not At Risk (10/16/2022)  Humiliation, Afraid, Rape, and Kick questionnaire    Fear of Current or Ex-Partner: No    Emotionally Abused: No    Physically Abused: No    Sexually Abused: No    Review of Systems  Constitutional:  Negative for chills and fever.  Eyes:  Negative for blurred vision.  Respiratory:  Negative for cough, shortness of breath and wheezing.   Cardiovascular:  Negative for chest pain, palpitations and leg swelling.  Gastrointestinal:  Negative for abdominal pain.  Neurological:  Negative for dizziness and headaches.        Objective    BP 128/62   Pulse 87   Temp 97.8 F (36.6 C)   Ht 6\' 3"  (1.905 m)   Wt 227 lb (103 kg)   SpO2 93% Comment: 2 Lt  BMI 28.37 kg/m   Physical Exam Constitutional:      Appearance: Normal appearance.  HENT:     Head: Normocephalic and atraumatic.  Eyes:     Conjunctiva/sclera: Conjunctivae normal.  Cardiovascular:     Rate and Rhythm: Normal rate and regular rhythm.  Pulmonary:     Effort: Pulmonary effort is normal.     Breath sounds: Normal breath sounds.     Comments: On 2 L   Musculoskeletal:     Comments: Mild non-pitting edema present BLE  Skin:    General: Skin is warm and dry.  Neurological:     General: No focal deficit present.     Mental Status: He is alert. Mental status is at baseline.  Psychiatric:        Mood and Affect: Mood normal.        Behavior: Behavior normal.     Last CBC Lab Results  Component Value Date   WBC 7.9 11/20/2022   HGB 12.3 (L) 11/20/2022   HCT 38.7 11/20/2022   MCV 90.8 11/20/2022   MCH 28.9 11/20/2022   RDW 17.8 (H) 11/20/2022   PLT 210 11/20/2022   Last metabolic panel Lab Results  Component Value Date   GLUCOSE 90 11/20/2022   NA 140 11/20/2022   K 4.4 11/20/2022   CL 101 11/20/2022   CO2 33 (H) 11/20/2022   BUN 11 11/20/2022   CREATININE 0.58 (L) 11/20/2022   GFRNONAA >60 10/01/2022   CALCIUM 9.3 11/20/2022   PROT 6.3 11/20/2022   ALBUMIN 3.6 01/22/2022   BILITOT 0.3 11/20/2022   ALKPHOS 58 01/22/2022   AST 16 11/20/2022   ALT 17 11/20/2022   ANIONGAP 11 10/01/2022   Last lipids Lab Results  Component Value Date   CHOL 108 09/29/2022   HDL 56 09/29/2022   LDLCALC 41 09/29/2022   TRIG 55 09/29/2022   CHOLHDL 1.9 09/29/2022   Last hemoglobin A1c Lab Results  Component Value Date   HGBA1C 5.4 09/28/2022   Last thyroid functions Lab Results  Component Value Date   TSH 0.69 10/21/2021   Last vitamin D No results found for: "25OHVITD2", "25OHVITD3", "VD25OH" Last vitamin B12 and Folate Lab Results  Component Value Date   VITAMINB12 839 11/20/2022   FOLATE >24.0 11/20/2022        Assessment & Plan:   1. Hypertension, unspecified type/Chronic venous insufficiency: Blood pressure well controlled today, swelling improved. No changes to medications today.  2. Mixed hyperlipidemia: Stable on statin and aspirin.  3. Chronic obstructive pulmonary disease, unspecified COPD type (HCC): Continues to do well on oxygen, inhalers. Note reviewed from Pulmonology on 12/15/22.  4.  Erythrocytosis: Scheduled for labs and  Hematology in October.   Return in about 3 months (around 03/19/2023).   Margarita Mail, DO

## 2022-12-18 ENCOUNTER — Ambulatory Visit (INDEPENDENT_AMBULATORY_CARE_PROVIDER_SITE_OTHER): Payer: 59 | Admitting: Internal Medicine

## 2022-12-18 ENCOUNTER — Encounter: Payer: Self-pay | Admitting: Internal Medicine

## 2022-12-18 VITALS — BP 128/62 | HR 87 | Temp 97.8°F | Ht 75.0 in | Wt 227.0 lb

## 2022-12-18 DIAGNOSIS — I872 Venous insufficiency (chronic) (peripheral): Secondary | ICD-10-CM | POA: Diagnosis not present

## 2022-12-18 DIAGNOSIS — D751 Secondary polycythemia: Secondary | ICD-10-CM

## 2022-12-18 DIAGNOSIS — J449 Chronic obstructive pulmonary disease, unspecified: Secondary | ICD-10-CM | POA: Diagnosis not present

## 2022-12-18 DIAGNOSIS — I10 Essential (primary) hypertension: Secondary | ICD-10-CM | POA: Diagnosis not present

## 2022-12-18 DIAGNOSIS — E782 Mixed hyperlipidemia: Secondary | ICD-10-CM

## 2022-12-24 DIAGNOSIS — I5033 Acute on chronic diastolic (congestive) heart failure: Secondary | ICD-10-CM | POA: Diagnosis not present

## 2022-12-24 DIAGNOSIS — J9601 Acute respiratory failure with hypoxia: Secondary | ICD-10-CM | POA: Diagnosis not present

## 2022-12-24 DIAGNOSIS — J441 Chronic obstructive pulmonary disease with (acute) exacerbation: Secondary | ICD-10-CM | POA: Diagnosis not present

## 2022-12-31 ENCOUNTER — Ambulatory Visit: Payer: 59 | Admitting: Internal Medicine

## 2023-01-04 DIAGNOSIS — I5033 Acute on chronic diastolic (congestive) heart failure: Secondary | ICD-10-CM | POA: Diagnosis not present

## 2023-01-04 DIAGNOSIS — J9601 Acute respiratory failure with hypoxia: Secondary | ICD-10-CM | POA: Diagnosis not present

## 2023-01-07 ENCOUNTER — Ambulatory Visit: Payer: 59 | Attending: Adult Health

## 2023-01-07 DIAGNOSIS — F17210 Nicotine dependence, cigarettes, uncomplicated: Secondary | ICD-10-CM | POA: Insufficient documentation

## 2023-01-07 DIAGNOSIS — J449 Chronic obstructive pulmonary disease, unspecified: Secondary | ICD-10-CM | POA: Diagnosis not present

## 2023-01-07 DIAGNOSIS — J984 Other disorders of lung: Secondary | ICD-10-CM | POA: Insufficient documentation

## 2023-01-07 LAB — PULMONARY FUNCTION TEST ARMC ONLY
DL/VA % pred: 70 %
DL/VA: 2.78 ml/min/mmHg/L
DLCO unc % pred: 39 %
DLCO unc: 11.61 ml/min/mmHg
FEF 25-75 Post: 0.64 L/s
FEF 25-75 Pre: 1.09 L/s
FEF2575-%Change-Post: -41 %
FEF2575-%Pred-Post: 22 %
FEF2575-%Pred-Pre: 38 %
FEV1-%Change-Post: -5 %
FEV1-%Pred-Post: 45 %
FEV1-%Pred-Pre: 48 %
FEV1-Post: 1.73 L
FEV1-Pre: 1.83 L
FEV1FVC-%Change-Post: -10 %
FEV1FVC-%Pred-Pre: 83 %
FEV6-%Change-Post: 8 %
FEV6-%Pred-Post: 62 %
FEV6-%Pred-Pre: 58 %
FEV6-Post: 3.09 L
FEV6-Pre: 2.85 L
FEV6FVC-%Change-Post: -3 %
FEV6FVC-%Pred-Post: 102 %
FEV6FVC-%Pred-Pre: 105 %
FVC-%Change-Post: 5 %
FVC-%Pred-Post: 61 %
FVC-%Pred-Pre: 58 %
FVC-Post: 3.2 L
FVC-Pre: 3.02 L
Post FEV1/FVC ratio: 54 %
Post FEV6/FVC ratio: 97 %
Pre FEV1/FVC ratio: 61 %
Pre FEV6/FVC Ratio: 100 %
RV % pred: 158 %
RV: 4.42 L
TLC % pred: 96 %
TLC: 7.72 L

## 2023-01-07 MED ORDER — ALBUTEROL SULFATE (2.5 MG/3ML) 0.083% IN NEBU
2.5000 mg | INHALATION_SOLUTION | Freq: Once | RESPIRATORY_TRACT | Status: AC
  Administered 2023-01-07: 2.5 mg via RESPIRATORY_TRACT
  Filled 2023-01-07: qty 3

## 2023-01-14 ENCOUNTER — Encounter: Payer: Self-pay | Admitting: Oncology

## 2023-01-23 DIAGNOSIS — J441 Chronic obstructive pulmonary disease with (acute) exacerbation: Secondary | ICD-10-CM | POA: Diagnosis not present

## 2023-01-23 DIAGNOSIS — I5033 Acute on chronic diastolic (congestive) heart failure: Secondary | ICD-10-CM | POA: Diagnosis not present

## 2023-01-23 DIAGNOSIS — J9601 Acute respiratory failure with hypoxia: Secondary | ICD-10-CM | POA: Diagnosis not present

## 2023-02-03 DIAGNOSIS — J45909 Unspecified asthma, uncomplicated: Secondary | ICD-10-CM | POA: Diagnosis not present

## 2023-02-05 ENCOUNTER — Ambulatory Visit: Admission: RE | Admit: 2023-02-05 | Payer: 59 | Source: Ambulatory Visit

## 2023-02-08 ENCOUNTER — Inpatient Hospital Stay: Payer: 59

## 2023-02-08 ENCOUNTER — Inpatient Hospital Stay: Payer: 59 | Attending: Oncology

## 2023-02-08 ENCOUNTER — Inpatient Hospital Stay: Payer: 59 | Admitting: Oncology

## 2023-02-16 ENCOUNTER — Ambulatory Visit
Admission: RE | Admit: 2023-02-16 | Discharge: 2023-02-16 | Disposition: A | Discharge status: Home / Self Care | Payer: 59 | Source: Ambulatory Visit | Attending: Internal Medicine | Admitting: Internal Medicine

## 2023-02-16 ENCOUNTER — Other Ambulatory Visit: Payer: Self-pay

## 2023-02-16 DIAGNOSIS — Z87891 Personal history of nicotine dependence: Secondary | ICD-10-CM

## 2023-02-16 DIAGNOSIS — Z122 Encounter for screening for malignant neoplasm of respiratory organs: Secondary | ICD-10-CM | POA: Diagnosis not present

## 2023-02-16 DIAGNOSIS — F17210 Nicotine dependence, cigarettes, uncomplicated: Secondary | ICD-10-CM | POA: Diagnosis not present

## 2023-02-18 ENCOUNTER — Ambulatory Visit: Payer: 59 | Attending: Cardiovascular Disease | Admitting: Cardiovascular Disease

## 2023-02-18 ENCOUNTER — Encounter: Payer: Self-pay | Admitting: Cardiovascular Disease

## 2023-02-18 VITALS — BP 124/74 | HR 82 | Ht 75.0 in | Wt 237.0 lb

## 2023-02-18 DIAGNOSIS — I723 Aneurysm of iliac artery: Secondary | ICD-10-CM | POA: Diagnosis not present

## 2023-02-18 DIAGNOSIS — E785 Hyperlipidemia, unspecified: Secondary | ICD-10-CM | POA: Diagnosis not present

## 2023-02-18 DIAGNOSIS — Z720 Tobacco use: Secondary | ICD-10-CM

## 2023-02-18 DIAGNOSIS — R0602 Shortness of breath: Secondary | ICD-10-CM

## 2023-02-18 DIAGNOSIS — I10 Essential (primary) hypertension: Secondary | ICD-10-CM

## 2023-02-18 DIAGNOSIS — I7121 Aneurysm of the ascending aorta, without rupture: Secondary | ICD-10-CM | POA: Diagnosis not present

## 2023-02-18 NOTE — Patient Instructions (Signed)

## 2023-02-18 NOTE — Progress Notes (Signed)
Cardiology Office Note   Date:  02/18/2023   ID:  Joshua Schmidt, DOB 10/31/1950, MRN [id number]  PCP:  Margarita Mail, DO  Cardiologist:   Lorine Bears, MD   Chief Complaint  Patient presents with   Follow-up    Patient denies new or acute cardiac problems/concerns today.        History of Present Illness: Joshua Schmidt is a 72 y.o. male who is here today for follow-up visit regarding aortic and iliac aneurysms and possible bicuspid aortic valve.   The patient reports possibly having myocardial infarction at home years ago but did not seek medical attention.  He has chronic medical conditions that include essential hypertension, hyperlipidemia, tobacco use and COPD.  He has no family history of aortic aneurysm or coronary artery disease.  He used to smoke 1-1/2 pack/day but quit in June of this year after his hospitalization for COPD. He has been getting CT scan of the lungs for cancer screening and was noted to have dilated ascending aorta  at 4.7 cm.  In addition, he was noted to have aortic and coronary calcifications and calcified mitral valve annulus.  His pulmonary artery was noted to be dilated.  He also has history of chronic venous insufficiency with normal ABI.  He is status post sclerotherapy involving the right lower extremity. Due to concerns about dilated pulmonary artery, he underwent an echocardiogram in January of this year which showed normal LV systolic function with grade 1 diastolic dysfunction and possible bicuspid aortic valve. Abdominal aortic ultrasound showed evidence of infrarenal  abdominal aortic aneurysm as well as bilateral iliac aneurysms.  This was followed by CTA which showed tortuous aorta with infrarenal abdominal aortic aneurysm measuring 4.2 cm.  In addition, the right common iliac artery was aneurysmal with a diameter of 3.3 cm and the left common iliac artery had a diameter of 2.3 cm.  He underwent a Lexiscan Myoview in February which  showed no evidence of ischemia with normal ejection fraction.  He was seen by Dr. Karin Lieu from vascular surgery with recommendations to follow his aneurysmal disease with plans for intervention once his iliac aneurysm exceeds 3.5 cm.  He was hospitalized in June with COPD exacerbation.  He is now on 2 L of oxygen.  Fortunately, he quit smoking after his hospitalization.  His blood pressure is more controlled.  He denies chest pain or worsening dyspnea.   Past Medical History:  Diagnosis Date   Aneurysm (HCC)    COPD (chronic obstructive pulmonary disease) (HCC)    Erythrocytosis 12/01/2018   Hyperlipidemia    Hypertension    Myocardial infarction Joshua Schmidt)     Past Surgical History:  Procedure Laterality Date   APPENDECTOMY     COLONOSCOPY WITH PROPOFOL N/A 11/20/2014   Procedure: COLONOSCOPY WITH PROPOFOL;  Surgeon: Kieth Brightly, MD;  Location: [facility] ENDOSCOPY;  Service: Endoscopy;  Laterality: N/A;   COLONOSCOPY WITH PROPOFOL N/A 03/19/2022   Procedure: COLONOSCOPY WITH PROPOFOL;  Surgeon: Toney Reil, MD;  Location[facility] ENDOSCOPY;  Service: Gastroenterology;  Laterality: N/A;   TONSILLECTOMY AND ADENOIDECTOMY       Current Outpatient Medications  Medication Sig Dispense Refill   albuterol (VENTOLIN HFA) 108 (90 Base) MCG/ACT inhaler Inhale 2 puffs into the lungs every 6 (six) hours as needed for wheezing or shortness of breath. 8 g 2   aspirin 81 MG chewable tablet Chew 81 mg by mouth daily.     atorvastatin (LIPITOR) 20 MG tablet Take  1 tablet (20 mg total) by mouth daily. 100 tablet 2   carvedilol (COREG) 12.5 MG tablet Take 1 tablet (12.5 mg total) by mouth 2 (two) times daily. 180 tablet 3   dextromethorphan-guaiFENesin (MUCINEX DM) 30-600 MG 12hr tablet Take 1 tablet by mouth 2 (two) times daily as needed for cough. 30 tablet 0   fluticasone (FLONASE) 50 MCG/ACT nasal spray Place 2 sprays into both nostrils daily. 16 g 6   folic acid (FOLVITE) 1 MG tablet Take 1  tablet (1 mg total) by mouth daily. 90 tablet 0   hydrochlorothiazide (HYDRODIURIL) 25 MG tablet Take 25 mg by mouth daily.     losartan (COZAAR) 50 MG tablet Take 1 tablet (50 mg total) by mouth daily. 90 tablet 1   Multiple Vitamin (MULTIVITAMIN WITH MINERALS) TABS tablet Take 1 tablet by mouth daily. 90 tablet 0   Tiotropium Bromide-Olodaterol (STIOLTO RESPIMAT) 2.5-2.5 MCG/ACT AERS Inhale 2 puffs into the lungs daily. 4 g 0   Tiotropium Bromide-Olodaterol (STIOLTO RESPIMAT) 2.5-2.5 MCG/ACT AERS Inhale 2 puffs into the lungs daily. 1 each 5   No current facility-administered medications for this visit.    Allergies:   Patient has no known allergies.    Social History:  The patient  reports that he quit smoking about 4 months ago. His smoking use included cigarettes. He started smoking about 51 years ago. He has a 51 pack-year smoking history. He has never used smokeless tobacco. He reports current alcohol use. He reports that he does not use drugs.   Family History:  The patient's family history is not on file.    ROS:  Please see the history of present illness.   Otherwise, review of systems are positive for none.   All other systems are reviewed and negative.    PHYSICAL EXAM: VS:  BP 124/74 (BP Location: Left Arm, Patient Position: Sitting, Cuff Size: Large)   Pulse 82   Ht 6\' 3"  (1.905 m)   Wt 237 lb (107.5 kg)   SpO2 (!) 89% Comment: 2L O2  BMI 29.62 kg/m  , BMI Body mass index is 29.62 kg/m. GEN: Well nourished, well developed, in no acute distress  HEENT: normal  Neck: no JVD, carotid bruits, or masses Cardiac: RRR; no  rubs, or gallops,no edema .  1/6 systolic murmur in the aortic area. Respiratory:  clear to auscultation bilaterally, normal work of breathing GI: soft, nontender, nondistended, + BS MS: no deformity or atrophy  Skin: warm and dry, no rash Neuro:  Strength and sensation are intact Psych: euthymic mood, full affect   EKG:  EKG is ordered today. The  ekg ordered today demonstrates: Sinus rhythm with 1st degree A-V block Left axis deviation Possible Inferior infarct , age undetermined When compared with ECG of 28-Sep-2022 11:39, Questionable change in QRS axis ST no longer depressed in Anterior leads Nonspecific T wave abnormality no longer evident in Inferior leads T wave inversion no longer evident in Anterior leads    Recent Labs: 09/28/2022: Magnesium 1.8 10/02/2022: B Natriuretic Peptide 75.6 11/20/2022: ALT 17; BUN 11; Creat 0.58; Hemoglobin 12.3; Platelets 210; Potassium 4.4; Sodium 140    Lipid Panel    Component Value Date/Time   CHOL 108 09/29/2022 0507   TRIG 55 09/29/2022 0507   HDL 56 09/29/2022 0507   CHOLHDL 1.9 09/29/2022 0507   VLDL 11 09/29/2022 0507   LDLCALC 41 09/29/2022 0507   LDLCALC 50 10/21/2021 1145      Wt Readings from Last  3 Encounters:  02/18/23 237 lb (107.5 kg)  12/18/22 227 lb (103 kg)  12/15/22 225 lb 9.6 oz (102.3 kg)          02/17/2022    1:54 PM  PAD Screen  Previous PAD dx? Yes  Previous surgical procedure? Yes  Pain with walking? No  Feet/toe relief with dangling? No  Painful, non-healing ulcers? No  Extremities discolored? No      ASSESSMENT AND PLAN:  1.  Ascending aortic aneurysm: This is below threshold for surgical repair.  Given his COPD and the fact that he is now oxygen dependent, he is not going to be a surgical candidate for aortic aneurysm repair or even if this gets larger.  Thus, no need for further imaging of this.  2.  Exertional dyspnea and abnormal EKG: Echocardiogram showed normal LV systolic function and possible bicuspid aortic valve.  Lexiscan Myoview showed no evidence of ischemia.  Dyspnea is likely due to COPD.  3.  Essential hypertension: His blood pressure is well-controlled on current medications.  4.  Hyperlipidemia: Continue atorvastatin 20 mg once daily.  Most recent lipid profile showed an LDL of 41.  5.  Tobacco use: Fortunately, he  quit smoking in June.  6.  Abdominal aortic and iliac artery aneurysm:  his right iliac aneurysm is 3.3 cm.  He follows with vascular surgery.  He is not a candidate for open surgical repair due to his lung disease but can be a reasonable candidate for endovascular repair.   Disposition:   FU with me in 6 months  Signed,  Lorine Bears, MD  02/18/2023 1:24 PM    [facility]

## 2023-02-22 ENCOUNTER — Inpatient Hospital Stay: Payer: 59 | Attending: Oncology | Admitting: Oncology

## 2023-02-22 ENCOUNTER — Encounter: Payer: Self-pay | Admitting: Oncology

## 2023-02-22 ENCOUNTER — Inpatient Hospital Stay: Payer: 59

## 2023-02-22 VITALS — BP 156/96 | HR 87 | Temp 96.8°F | Resp 18 | Wt 235.5 lb

## 2023-02-22 DIAGNOSIS — D751 Secondary polycythemia: Secondary | ICD-10-CM | POA: Diagnosis not present

## 2023-02-22 DIAGNOSIS — Z87891 Personal history of nicotine dependence: Secondary | ICD-10-CM | POA: Insufficient documentation

## 2023-02-22 LAB — CBC WITH DIFFERENTIAL (CANCER CENTER ONLY)
Abs Immature Granulocytes: 0.01 10*3/uL (ref 0.00–0.07)
Basophils Absolute: 0.1 10*3/uL (ref 0.0–0.1)
Basophils Relative: 1 %
Eosinophils Absolute: 0.2 10*3/uL (ref 0.0–0.5)
Eosinophils Relative: 2 %
HCT: 50.3 % (ref 39.0–52.0)
Hemoglobin: 16.8 g/dL (ref 13.0–17.0)
Immature Granulocytes: 0 %
Lymphocytes Relative: 15 %
Lymphs Abs: 1.1 10*3/uL (ref 0.7–4.0)
MCH: 32.5 pg (ref 26.0–34.0)
MCHC: 33.4 g/dL (ref 30.0–36.0)
MCV: 97.3 fL (ref 80.0–100.0)
Monocytes Absolute: 0.5 10*3/uL (ref 0.1–1.0)
Monocytes Relative: 7 %
Neutro Abs: 5.4 10*3/uL (ref 1.7–7.7)
Neutrophils Relative %: 75 %
Platelet Count: 191 10*3/uL (ref 150–400)
RBC: 5.17 MIL/uL (ref 4.22–5.81)
RDW: 12.5 % (ref 11.5–15.5)
WBC Count: 7.2 10*3/uL (ref 4.0–10.5)
nRBC: 0 % (ref 0.0–0.2)

## 2023-02-22 NOTE — Assessment & Plan Note (Signed)
Encouraged his cessation efforts. Patient follows with lung cancer screening program for annual CT chest low-dose

## 2023-02-22 NOTE — Progress Notes (Signed)
Hematology/Oncology Progress note [phone] [phone]      Patient Care Team: Margarita Mail, DO as PCP - General (Internal Medicine) Iran Ouch, MD as PCP - Cardiology (Cardiology) Rickard Patience, MD as Consulting Physician (Oncology)  ASSESSMENT & PLAN:   Erythrocytosis Labs reviewed and discussed with patient. Continue monitor and proceed with phlebotomy if hematocrit is >-=52 No need for phlebotomy today  Former smoker Encouraged his cessation efforts. Patient follows with lung cancer screening program for annual CT chest low-dose  Orders Placed This Encounter  Procedures   CBC with Differential (Cancer Center Only)    Standing Status:   Future    Standing Expiration Date:   02/22/2024   Follow-up in 6 months, lab MD   All questions were answered. The patient knows to call the clinic with any problems, questions or concerns.  Rickard Patience, MD, PhD [facility] Hematology Oncology 02/22/2023   CHIEF COMPLAINTS/REASON FOR VISIT:  Follow up for secondary erythrocytosis.  HISTORY OF PRESENTING ILLNESS:  Joshua Schmidt is a 72 y.o. male who was seen in consultation at the request of Margarita Mail, DO for evaluation of polycytosis/erythrocytosis Patient had lab work done with primary care provider and was found to have high hemoglobin.  Lab results are not available to me.  Patient was referred to heme-onc for further evaluation and discussion. Associated signs or symptoms: Denies weight loss, fever, chills, fatigue, night sweats.   Context:  Smoking history: 76.5 pack year smoking history, he reports that he quitted in the past and then smoke again Testosterone supplements: Denies History of blood clots: Denies Daytime somnolence: Denies Family history of polycythemia: Denies Denies any personal history of thrombosis, stroke, heart attack.  Denies any skin itchiness.  He is a retired Product/process development scientist, remains active.  Mainly works  outdoors. Sleep apnea, not interested in using CPAP machine  Intermittent right ankle swelling, varicose vein, previously seen by vascular surgeon and status post laser ablation of the right great saphenous vein on 05/05/2021.  Post laser ultrasound showed no DVT. Status post sclerotherapy.  INTERVAL HISTORY Joshua Schmidt is a 72 y.o. male who has above history reviewed by me today presents for follow up visit for management of erythrocytosis Patient  has stopped smoking.  He was admitted in June 2024 dye to acute respiratory failure, now on nasal cannula oxygen.  He does not use CPAP machine. He reports that sleep quality is better now after started on oxygen.      Review of Systems  Constitutional:  Negative for appetite change, chills, diaphoresis, fatigue, fever and unexpected weight change.  HENT:   Negative for hearing loss, lump/mass, nosebleeds, sore throat and voice change.   Eyes:  Negative for eye problems and icterus.  Respiratory:  Negative for chest tightness, cough, hemoptysis, shortness of breath and wheezing.   Cardiovascular:  Negative for chest pain and leg swelling.  Gastrointestinal:  Negative for abdominal distention, abdominal pain, blood in stool, diarrhea, nausea and rectal pain.  Endocrine: Negative for hot flashes.  Genitourinary:  Negative for bladder incontinence, difficulty urinating, dysuria, frequency, hematuria and nocturia.   Musculoskeletal:  Negative for arthralgias, back pain, flank pain, gait problem and myalgias.  Skin:  Negative for itching and rash.  Neurological:  Negative for dizziness, gait problem, headaches, light-headedness, numbness and seizures.  Hematological:  Negative for adenopathy. Does not bruise/bleed easily.  Psychiatric/Behavioral:  Negative for confusion and decreased concentration. The patient is not nervous/anxious.     MEDICAL HISTORY:  Past Medical History:  Diagnosis Date   Aneurysm (HCC)    COPD (chronic obstructive  pulmonary disease) (HCC)    Erythrocytosis 12/01/2018   Hyperlipidemia    Hypertension    Myocardial infarction [facility])     SURGICAL HISTORY: Past Surgical History:  Procedure Laterality Date   APPENDECTOMY     COLONOSCOPY WITH PROPOFOL N/A 11/20/2014   Procedure: COLONOSCOPY WITH PROPOFOL;  Surgeon: Kieth Brightly, MD;  Location: [facility] ENDOSCOPY;  Service: Endoscopy;  Laterality: N/A;   COLONOSCOPY WITH PROPOFOL N/A 03/19/2022   Procedure: COLONOSCOPY WITH PROPOFOL;  Surgeon: Toney Reil, MD;  Location: [facility] ENDOSCOPY;  Service: Gastroenterology;  Laterality: N/A;   TONSILLECTOMY AND ADENOIDECTOMY      SOCIAL HISTORY: Social History   Socioeconomic History   Marital status: Married    Spouse name: Not on file   Number of children: Not on file   Years of education: Not on file   Highest education level: Not on file  Occupational History   Not on file  Tobacco Use   Smoking status: Former    Current packs/day: 0.00    Average packs/day: 1 pack/day for 51.0 years (51.0 ttl pk-yrs)    Types: Cigarettes    Start date: 10/01/1971    Quit date: 10/01/2022    Years since quitting: 0.3   Smokeless tobacco: Never  Vaping Use   Vaping status: Never Used  Substance and Sexual Activity   Alcohol use: Yes   Drug use: No   Sexual activity: Not Currently  Other Topics Concern   Not on file  Social History Narrative   Not on file   Social Determinants of Health   Financial Resource Strain: Low Risk  (10/16/2022)   Overall Financial Resource Strain (CARDIA)    Difficulty of Paying Living Expenses: Not hard at all  Food Insecurity: No Food Insecurity (10/16/2022)   Hunger Vital Sign    Worried About Running Out of Food in the Last Year: Never true    Ran Out of Food in the Last Year: Never true  Transportation Needs: No Transportation Needs (10/16/2022)   PRAPARE - Administrator, Civil Service (Medical): No    Lack of Transportation (Non-Medical): No  Physical  Activity: Sufficiently Active (10/16/2022)   Exercise Vital Sign    Days of Exercise per Week: 3 days    Minutes of Exercise per Session: 90 min  Stress: No Stress Concern Present (10/16/2022)   [employer] of Occupational Health - Occupational Stress Questionnaire    Feeling of Stress : Only a little  Social Connections: Moderately Integrated (10/16/2022)   Social Connection and Isolation Panel [NHANES]    Frequency of Communication with Friends and Family: Three times a week    Frequency of Social Gatherings with Friends and Family: More than three times a week    Attends Religious Services: More than 4 times per year    Active Member of [employer] or Organizations: No    Attends Banker Meetings: Never    Marital Status: Married  Catering manager Violence: Not At Risk (10/16/2022)   Humiliation, Afraid, Rape, and Kick questionnaire    Fear of Current or Ex-Partner: No    Emotionally Abused: No    Physically Abused: No    Sexually Abused: No    FAMILY HISTORY: History reviewed. No pertinent family history.  ALLERGIES:  has No Known Allergies.  MEDICATIONS:  Current Outpatient Medications  Medication Sig Dispense Refill   albuterol (  VENTOLIN HFA) 108 (90 Base) MCG/ACT inhaler Inhale 2 puffs into the lungs every 6 (six) hours as needed for wheezing or shortness of breath. 8 g 2   aspirin 81 MG chewable tablet Chew 81 mg by mouth daily.     atorvastatin (LIPITOR) 20 MG tablet Take 1 tablet (20 mg total) by mouth daily. 100 tablet 2   carvedilol (COREG) 12.5 MG tablet Take 1 tablet (12.5 mg total) by mouth 2 (two) times daily. 180 tablet 3   dextromethorphan-guaiFENesin (MUCINEX DM) 30-600 MG 12hr tablet Take 1 tablet by mouth 2 (two) times daily as needed for cough. 30 tablet 0   fluticasone (FLONASE) 50 MCG/ACT nasal spray Place 2 sprays into both nostrils daily. 16 g 6   folic acid (FOLVITE) 1 MG tablet Take 1 tablet (1 mg total) by mouth daily. 90 tablet 0    hydrochlorothiazide (HYDRODIURIL) 25 MG tablet Take 25 mg by mouth daily.     losartan (COZAAR) 50 MG tablet Take 1 tablet (50 mg total) by mouth daily. 90 tablet 1   Multiple Vitamin (MULTIVITAMIN WITH MINERALS) TABS tablet Take 1 tablet by mouth daily. 90 tablet 0   Tiotropium Bromide-Olodaterol (STIOLTO RESPIMAT) 2.5-2.5 MCG/ACT AERS Inhale 2 puffs into the lungs daily. 4 g 0   Tiotropium Bromide-Olodaterol (STIOLTO RESPIMAT) 2.5-2.5 MCG/ACT AERS Inhale 2 puffs into the lungs daily. 1 each 5   No current facility-administered medications for this visit.     PHYSICAL EXAMINATION: ECOG PERFORMANCE STATUS: 0 - Asymptomatic Vitals:   02/22/23 1416  BP: (!) 156/96  Pulse: 87  Resp: 18  Temp: (!) 96.8 F (36 C)  SpO2: 94%   Filed Weights   02/22/23 1416  Weight: 235 lb 8 oz (106.8 kg)    Physical Exam Constitutional:      General: He is not in acute distress. HENT:     Head: Normocephalic and atraumatic.  Eyes:     General: No scleral icterus. Cardiovascular:     Rate and Rhythm: Normal rate.  Pulmonary:     Effort: Pulmonary effort is normal. No respiratory distress.     Comments: On nasal cannula oxygen.  Abdominal:     General: There is no distension.     Palpations: Abdomen is soft.  Musculoskeletal:        General: Normal range of motion.     Cervical back: Normal range of motion and neck supple.  Skin:    General: Skin is warm and dry.     Findings: No erythema.  Neurological:     Mental Status: He is alert and oriented to person, place, and time. Mental status is at baseline.     Cranial Nerves: No cranial nerve deficit.  Psychiatric:        Mood and Affect: Mood normal.     RADIOGRAPHIC STUDIES: I have personally reviewed the radiological images as listed and agreed with the findings in the report. No results found.   LABORATORY DATA:  I have reviewed the data as listed    Latest Ref Rng & Units 02/22/2023    2:11 PM 11/20/2022    8:38 AM 10/09/2022    12:39 PM  CBC  WBC 4.0 - 10.5 K/uL 7.2  7.9    Hemoglobin 13.0 - 17.0 g/dL 16.1  09.6  04.5   Hematocrit 39.0 - 52.0 % 50.3  38.7  50.9   Platelets 150 - 400 K/uL 191  210        Latest Ref  Rng & Units 11/20/2022    8:38 AM 10/01/2022    4:36 AM 09/30/2022    3:46 AM  CMP  Glucose 65 - 99 mg/dL 90  161  096   BUN 7 - 25 mg/dL 11  21  19    Creatinine 0.70 - 1.28 mg/dL 0.45  4.09  8.11   Sodium 135 - 146 mmol/L 140  137  136   Potassium 3.5 - 5.3 mmol/L 4.4  3.9  3.5   Chloride 98 - 110 mmol/L 101  85  90   CO2 20 - 32 mmol/L 33  41  37   Calcium 8.6 - 10.3 mg/dL 9.3  8.4  8.2   Total Protein 6.1 - 8.1 g/dL 6.3     Total Bilirubin 0.2 - 1.2 mg/dL 0.3     AST 10 - 35 U/L 16     ALT 9 - 46 U/L 17       Iron/TIBC/Ferritin/ %Sat    Component Value Date/Time   IRON 27 (L) 10/22/2021 1326   TIBC 428 10/22/2021 1326   FERRITIN 8 (L) 10/22/2021 1326   IRONPCTSAT 6 (L) 10/22/2021 1326

## 2023-02-22 NOTE — Assessment & Plan Note (Addendum)
Labs reviewed and discussed with patient. Continue monitor and proceed with phlebotomy if hematocrit is >-=52 No need for phlebotomy today

## 2023-02-23 DIAGNOSIS — J441 Chronic obstructive pulmonary disease with (acute) exacerbation: Secondary | ICD-10-CM | POA: Diagnosis not present

## 2023-02-23 DIAGNOSIS — J9601 Acute respiratory failure with hypoxia: Secondary | ICD-10-CM | POA: Diagnosis not present

## 2023-02-23 DIAGNOSIS — I5033 Acute on chronic diastolic (congestive) heart failure: Secondary | ICD-10-CM | POA: Diagnosis not present

## 2023-02-26 ENCOUNTER — Telehealth: Payer: Self-pay | Admitting: Internal Medicine

## 2023-02-26 ENCOUNTER — Ambulatory Visit: Payer: Self-pay | Admitting: *Deleted

## 2023-02-26 ENCOUNTER — Other Ambulatory Visit: Payer: Self-pay | Admitting: Nurse Practitioner

## 2023-02-26 DIAGNOSIS — J449 Chronic obstructive pulmonary disease, unspecified: Secondary | ICD-10-CM

## 2023-02-26 MED ORDER — STIOLTO RESPIMAT 2.5-2.5 MCG/ACT IN AERS
2.0000 | INHALATION_SPRAY | Freq: Every day | RESPIRATORY_TRACT | 0 refills | Status: DC
Start: 2023-02-26 — End: 2023-03-19

## 2023-02-26 NOTE — Telephone Encounter (Signed)
Pt is calling in because he requested a refill of Tiotropium Bromide-Olodaterol (STIOLTO RESPIMAT) 2.5-2.5 MCG/ACT AERS [[id number]] to [facility] and the medication won't be here until 11/22. Pt wants to know was there any way he could get the medication sent to  [id number] [place], [place] - [address]

## 2023-02-26 NOTE — Telephone Encounter (Signed)
  Chief Complaint: can not get refill on inhaler due to being processed at [facility]. Tiotropium Bromide-Olodaterol (STIOLTO RESPIMAT) 2.5-2.5 MCG/ACT AERS  Symptoms: took last dose of inhaler this am  Frequency: na Pertinent Negatives: Patient denies na Disposition: [] ED /[] Urgent Care (no appt availability in office) / [] Appointment(In office/virtual)/ []  [facility]/ [x] Home Care/ [] Refused Recommended Disposition /[] [place] [facility]/ []  Follow-up with PCP Additional Notes:   [facility] by [facility]. NT contacted [facility] for confirmation and contacted [facility] mail delivery for patient to cancel Rx from [facility] since Rx would not arrive to patient home until approx. 03/04/23.   Rx canceled by Loraine Leriche from [employer] delivery per request. Recommended [facility] in approx 30 minutes to an 1 hour for verification Rx can be filled. Call back for further assistance.    Reason for Disposition  Caller has medicine question only, adult not sick, AND triager answers question  Answer Assessment - Initial Assessment Questions 1. NAME of MEDICINE: "What medicine(s) are you calling about?"     Patient out of stioloto resimat inhaler and could not get [facility] due to already being sent to [facility] mail order, took last dose of medication this am  2. QUESTION: "What is your question?" (e.g., double dose of medicine, side effect)     How can I get Rx refilled today ? 3. PRESCRIBER: "Who prescribed the medicine?" Reason: if prescribed by specialist, call should be referred to that group.     Norville Haggard 4. SYMPTOMS: "Do you have any symptoms?" If Yes, ask: "What symptoms are you having?"  "How bad are the symptoms (e.g., mild, moderate, severe)     na 5. PREGNANCY:  "Is there any chance that you are pregnant?" "When was your last menstrual period?"     na  Protocols used: Medication Question Call-A-AH

## 2023-02-26 NOTE — Telephone Encounter (Signed)
Called left vm ..

## 2023-03-06 DIAGNOSIS — J45909 Unspecified asthma, uncomplicated: Secondary | ICD-10-CM | POA: Diagnosis not present

## 2023-03-10 ENCOUNTER — Other Ambulatory Visit: Payer: Self-pay | Admitting: Internal Medicine

## 2023-03-10 DIAGNOSIS — I10 Essential (primary) hypertension: Secondary | ICD-10-CM

## 2023-03-15 NOTE — Telephone Encounter (Signed)
Requested Prescriptions  Pending Prescriptions Disp Refills   losartan (COZAAR) 50 MG tablet [Pharmacy Med Name: Losartan Potassium 50 MG Oral Tablet] 100 tablet 1    Sig: TAKE 1 TABLET BY MOUTH DAILY     Cardiovascular:  Angiotensin Receptor Blockers Failed - 03/10/2023 10:32 PM      Failed - Cr in normal range and within 180 days    Creat  Date Value Ref Range Status  11/20/2022 0.58 (L) 0.70 - 1.28 mg/dL Final         Failed - Last BP in normal range    BP Readings from Last 1 Encounters:  02/22/23 (!) 156/96         Passed - K in normal range and within 180 days    Potassium  Date Value Ref Range Status  11/20/2022 4.4 3.5 - 5.3 mmol/L Final         Passed - Patient is not pregnant      Passed - Valid encounter within last 6 months    Recent Outpatient Visits           2 months ago Hypertension, unspecified type   [facility] Margarita Mail, DO   3 months ago Hypertension, unspecified type   [facility] Margarita Mail, DO   5 months ago Acute respiratory failure with hypoxia [facility])   [facility] Margarita Mail, DO   8 months ago Hypertension, unspecified type   [facility] Margarita Mail, DO   9 months ago Angioedema, initial encounter   [facility] Margarita Mail, DO       Future Appointments             In 4 days Margarita Mail, DO [facility], [facility]

## 2023-03-18 ENCOUNTER — Other Ambulatory Visit: Payer: Self-pay

## 2023-03-18 DIAGNOSIS — Z122 Encounter for screening for malignant neoplasm of respiratory organs: Secondary | ICD-10-CM

## 2023-03-18 DIAGNOSIS — Z87891 Personal history of nicotine dependence: Secondary | ICD-10-CM

## 2023-03-18 NOTE — Progress Notes (Signed)
Established Patient Office Visit  Subjective    Patient ID: Joshua Schmidt, male    DOB: 02/11/1951  Age: 72 y.o. MRN: [id number]  CC:  Chief Complaint  Patient presents with   Medical Management of Chronic Issues    3 month recheck    HPI Joshua Schmidt presents to follow up. Patient has been doing well since our last visit. Interested in potentially being screened for Alzheimer's dementia.  Does have history of his father with dementia in his late 3s.  Patient denies any cognitive decline or memory changes.  Hypertension: -Medications: Coreg 12.5 mg BID, HCTZ 25 mg, Losartan 50 mg -Patient is compliant with above medications and reports no side effects.   -Failed Meds: Lisinopril due to facial swelling -Checking BP at home (average): 132-140/80 -Denies any SOB, CP, vision changes or symptoms of hypotension - does have BLE edema but fairly well controlled  -Following with Cardiology, last seen 02/18/23  HLD/History of MI/Aortic and Iliac Aneurysms/Chronic Venous Insufficiency:   -Medications: Lipitor 20 mg, aspirin 81 mg -Patient is compliant with above medications and reports no side effects.  -History of MI in 2022 for which he did not seek medical attention at the time, was diagnosed retroactively  -CTA 7/24 with dilated ascending thoracic aorta 4.5 cm, iliac duplex US 8/24 with 4 cm AAA, 3.4 cm right common iliac artery aneurysm without significant growth. - Per Cardiology note, patient no longer a candidate for surgical fixation now that he is oxygen dependent so routine imaging is no longer required.  Of note, ascending thoracic aortic aneurysm minimally progressive to 4.7 cm on lung CT 11/24 -Last lipid panel: Lipid Panel     Component Value Date/Time   CHOL 108 09/29/2022 0507   TRIG 55 09/29/2022 0507   HDL 56 09/29/2022 0507   CHOLHDL 1.9 09/29/2022 0507   VLDL 11 09/29/2022 0507   LDLCALC 41 09/29/2022 0507   LDLCALC 50 10/21/2021 1145   Erythrocytosis:   -Following with Hematology, last seen 02/22/2023 -Last labs from November showing improvement in hemoglobin to 16.8  COPD: -COPD status: stable -Current medications: Now on Stiolto, Albuterol   -Oxygen use: yes -requiring 2 L supplemental oxygen but has been able to maintain oxygen saturations of 90% or above at rest or with minimal activity -Dyspnea frequency: Occasional  -Cough frequency: Daily, productive  -Limitation of activity: no -Pneumovax: Up to Date -Influenza: Up to Date -Annual lung cancer screening: 11/24 Lung-RADS-2  -Stopped smoking since hospitalization -Now following with Pulmonology, last seen 12/15/22  PTSD: -Not currently on medication -Had done counseling in the past but not interested it anything now  Health Maintance: -Blood work UTD -Colon cancer screening: colonoscopy 12/23, 3 polyps removed, plan to follow up in 1 year. Referral placed today -Just had flu and RSP vaccines in August  Outpatient Encounter Medications as of 03/19/2023  Medication Sig   albuterol (VENTOLIN HFA) 108 (90 Base) MCG/ACT inhaler Inhale 2 puffs into the lungs every 6 (six) hours as needed for wheezing or shortness of breath.   aspirin 81 MG chewable tablet Chew 81 mg by mouth daily.   atorvastatin (LIPITOR) 20 MG tablet Take 1 tablet (20 mg total) by mouth daily.   carvedilol (COREG) 12.5 MG tablet Take 1 tablet (12.5 mg total) by mouth 2 (two) times daily.   fluticasone (FLONASE) 50 MCG/ACT nasal spray Place 2 sprays into both nostrils daily.   folic acid (FOLVITE) 1 MG tablet Take 1 tablet (1 mg total)  by mouth daily.   hydrochlorothiazide (HYDRODIURIL) 25 MG tablet Take 25 mg by mouth daily.   losartan (COZAAR) 50 MG tablet TAKE 1 TABLET BY MOUTH DAILY   Multiple Vitamin (MULTIVITAMIN WITH MINERALS) TABS tablet Take 1 tablet by mouth daily.   Tiotropium Bromide-Olodaterol (STIOLTO RESPIMAT) 2.5-2.5 MCG/ACT AERS Inhale 2 puffs into the lungs daily.   dextromethorphan-guaiFENesin  (MUCINEX DM) 30-600 MG 12hr tablet Take 1 tablet by mouth 2 (two) times daily as needed for cough. (Patient not taking: Reported on 03/19/2023)   Tiotropium Bromide-Olodaterol (STIOLTO RESPIMAT) 2.5-2.5 MCG/ACT AERS Inhale 2 puffs into the lungs daily. (Patient not taking: Reported on 03/19/2023)   No facility-administered encounter medications on file as of 03/19/2023.    Past Medical History:  Diagnosis Date   Aneurysm (HCC)    COPD (chronic obstructive pulmonary disease) (HCC)    Erythrocytosis 12/01/2018   Hyperlipidemia    Hypertension    Myocardial infarction [facility])     Past Surgical History:  Procedure Laterality Date   APPENDECTOMY     COLONOSCOPY WITH PROPOFOL N/A 11/20/2014   Procedure: COLONOSCOPY WITH PROPOFOL;  Surgeon: Kieth Brightly, MD;  Location: [facility] ENDOSCOPY;  Service: Endoscopy;  Laterality: N/A;   COLONOSCOPY WITH PROPOFOL N/A 03/19/2022   Procedure: COLONOSCOPY WITH PROPOFOL;  Surgeon: Toney Reil, MD;  Location: [facility] ENDOSCOPY;  Service: Gastroenterology;  Laterality: N/A;   TONSILLECTOMY AND ADENOIDECTOMY      No family history on file.  Social History   Socioeconomic History   Marital status: Married    Spouse name: Not on file   Number of children: Not on file   Years of education: Not on file   Highest education level: Not on file  Occupational History   Not on file  Tobacco Use   Smoking status: Former    Current packs/day: 0.00    Average packs/day: 1 pack/day for 51.0 years (51.0 ttl pk-yrs)    Types: Cigarettes    Start date: 10/01/1971    Quit date: 10/01/2022    Years since quitting: 0.4   Smokeless tobacco: Never  Vaping Use   Vaping status: Never Used  Substance and Sexual Activity   Alcohol use: Yes   Drug use: No   Sexual activity: Not Currently  Other Topics Concern   Not on file  Social History Narrative   Not on file   Social Determinants of Health   Financial Resource Strain: Low Risk  (10/16/2022)   Overall  Financial Resource Strain (CARDIA)    Difficulty of Paying Living Expenses: Not hard at all  Food Insecurity: No Food Insecurity (10/16/2022)   Hunger Vital Sign    Worried About Running Out of Food in the Last Year: Never true    Ran Out of Food in the Last Year: Never true  Transportation Needs: No Transportation Needs (10/16/2022)   PRAPARE - Administrator, Civil Service (Medical): No    Lack of Transportation (Non-Medical): No  Physical Activity: Sufficiently Active (10/16/2022)   Exercise Vital Sign    Days of Exercise per Week: 3 days    Minutes of Exercise per Session: 90 min  Stress: No Stress Concern Present (10/16/2022)   [employer] of Occupational Health - Occupational Stress Questionnaire    Feeling of Stress : Only a little  Social Connections: Moderately Integrated (10/16/2022)   Social Connection and Isolation Panel [NHANES]    Frequency of Communication with Friends and Family: Three times a week  Frequency of Social Gatherings with Friends and Family: More than three times a week    Attends Religious Services: More than 4 times per year    Active Member of [employer] or Organizations: No    Attends Banker Meetings: Never    Marital Status: Married  Catering manager Violence: Not At Risk (10/16/2022)   Humiliation, Afraid, Rape, and Kick questionnaire    Fear of Current or Ex-Partner: No    Emotionally Abused: No    Physically Abused: No    Sexually Abused: No    Review of Systems  Constitutional:  Negative for chills and fever.  Eyes:  Negative for blurred vision.  Respiratory:  Negative for cough, shortness of breath and wheezing.   Cardiovascular:  Negative for chest pain, palpitations and leg swelling.  Gastrointestinal:  Negative for abdominal pain.  Neurological:  Negative for dizziness and headaches.        Objective    BP 132/80   Pulse 85   Temp 98.1 F (36.7 C) (Oral)   Resp 18   Ht 6\' 3"  (1.905 m)   Wt 233 lb 8 oz  (105.9 kg)   SpO2 92%   BMI 29.19 kg/m   Physical Exam Constitutional:      Appearance: Normal appearance.  HENT:     Head: Normocephalic and atraumatic.  Eyes:     Conjunctiva/sclera: Conjunctivae normal.  Cardiovascular:     Rate and Rhythm: Normal rate and regular rhythm.  Pulmonary:     Effort: Pulmonary effort is normal.     Breath sounds: Normal breath sounds.     Comments: On 2 L [place] Musculoskeletal:     Comments: Mild non-pitting edema present BLE  Skin:    General: Skin is warm and dry.  Neurological:     General: No focal deficit present.     Mental Status: He is alert. Mental status is at baseline.  Psychiatric:        Mood and Affect: Mood normal.        Behavior: Behavior normal.     Last CBC Lab Results  Component Value Date   WBC 7.2 02/22/2023   HGB 16.8 02/22/2023   HCT 50.3 02/22/2023   MCV 97.3 02/22/2023   MCH 32.5 02/22/2023   RDW 12.5 02/22/2023   PLT 191 02/22/2023   Last metabolic panel Lab Results  Component Value Date   GLUCOSE 90 11/20/2022   NA 140 11/20/2022   K 4.4 11/20/2022   CL 101 11/20/2022   CO2 33 (H) 11/20/2022   BUN 11 11/20/2022   CREATININE 0.58 (L) 11/20/2022   GFRNONAA >60 10/01/2022   CALCIUM 9.3 11/20/2022   PROT 6.3 11/20/2022   ALBUMIN 3.6 01/22/2022   BILITOT 0.3 11/20/2022   ALKPHOS 58 01/22/2022   AST 16 11/20/2022   ALT 17 11/20/2022   ANIONGAP 11 10/01/2022   Last lipids Lab Results  Component Value Date   CHOL 108 09/29/2022   HDL 56 09/29/2022   LDLCALC 41 09/29/2022   TRIG 55 09/29/2022   CHOLHDL 1.9 09/29/2022   Last hemoglobin A1c Lab Results  Component Value Date   HGBA1C 5.4 09/28/2022   Last thyroid functions Lab Results  Component Value Date   TSH 0.69 10/21/2021   Last vitamin D No results found for: "25OHVITD2", "25OHVITD3", "VD25OH" Last vitamin B12 and Folate Lab Results  Component Value Date   VITAMINB12 839 11/20/2022   FOLATE >24.0 11/20/2022  Assessment  & Plan:   1. Hypertension, unspecified type: Blood pressure stable here today, no changes made to medications and appropriate refills sent to pharmacy.   - hydrochlorothiazide (HYDRODIURIL) 25 MG tablet; Take 1 tablet (25 mg total) by mouth daily.  Dispense: 90 tablet; Refill: 1  2. Mixed hyperlipidemia/Aneurysm of ascending aorta without rupture [facility]): Lung CT showing minimal progression of ascending thoracic aortic aneurysm to 4.7 cm.  Per cardiology note now that patient is dependent on supplemental oxygen, he is no longer a candidate for surgery.  Continue to manage medically with statin and aspirin.  3. Chronic obstructive pulmonary disease, unspecified COPD type (HCC): Stable, doing well on Stellato and albuterol as needed.  Patient is requiring less oxygen at rest and with minimal activity.  4. Colon cancer screening: Patient due for repeat colonoscopy, referral to GI placed.  - Ambulatory referral to Gastroenterology   Return in about 3 months (around 06/17/2023).   Margarita Mail, DO

## 2023-03-19 ENCOUNTER — Ambulatory Visit (INDEPENDENT_AMBULATORY_CARE_PROVIDER_SITE_OTHER): Payer: 59 | Admitting: Internal Medicine

## 2023-03-19 ENCOUNTER — Encounter: Payer: Self-pay | Admitting: Internal Medicine

## 2023-03-19 VITALS — BP 132/80 | HR 85 | Temp 98.1°F | Resp 18 | Ht 75.0 in | Wt 233.5 lb

## 2023-03-19 DIAGNOSIS — E782 Mixed hyperlipidemia: Secondary | ICD-10-CM

## 2023-03-19 DIAGNOSIS — Z1211 Encounter for screening for malignant neoplasm of colon: Secondary | ICD-10-CM | POA: Diagnosis not present

## 2023-03-19 DIAGNOSIS — I10 Essential (primary) hypertension: Secondary | ICD-10-CM

## 2023-03-19 DIAGNOSIS — I7121 Aneurysm of the ascending aorta, without rupture: Secondary | ICD-10-CM | POA: Diagnosis not present

## 2023-03-19 DIAGNOSIS — J449 Chronic obstructive pulmonary disease, unspecified: Secondary | ICD-10-CM

## 2023-03-19 MED ORDER — HYDROCHLOROTHIAZIDE 25 MG PO TABS: 25.0000 mg | ORAL_TABLET | Freq: Every day | ORAL | 1 refills | Status: DC

## 2023-03-24 ENCOUNTER — Other Ambulatory Visit
Admission: RE | Admit: 2023-03-24 | Discharge: 2023-03-24 | Disposition: A | Discharge status: Home / Self Care | Payer: 59 | Source: Ambulatory Visit | Attending: Pulmonary Disease | Admitting: Pulmonary Disease

## 2023-03-24 ENCOUNTER — Ambulatory Visit: Payer: 59 | Admitting: Pulmonary Disease

## 2023-03-24 ENCOUNTER — Encounter: Payer: Self-pay | Admitting: Pulmonary Disease

## 2023-03-24 VITALS — BP 128/86 | HR 78 | Temp 97.5°F | Ht 75.0 in | Wt 239.0 lb

## 2023-03-24 DIAGNOSIS — Z87891 Personal history of nicotine dependence: Secondary | ICD-10-CM | POA: Diagnosis not present

## 2023-03-24 DIAGNOSIS — J9612 Chronic respiratory failure with hypercapnia: Secondary | ICD-10-CM

## 2023-03-24 DIAGNOSIS — D751 Secondary polycythemia: Secondary | ICD-10-CM

## 2023-03-24 DIAGNOSIS — J9611 Chronic respiratory failure with hypoxia: Secondary | ICD-10-CM | POA: Diagnosis not present

## 2023-03-24 DIAGNOSIS — J449 Chronic obstructive pulmonary disease, unspecified: Secondary | ICD-10-CM | POA: Diagnosis not present

## 2023-03-24 DIAGNOSIS — I5032 Chronic diastolic (congestive) heart failure: Secondary | ICD-10-CM

## 2023-03-24 MED ORDER — BREZTRI AEROSPHERE 160-9-4.8 MCG/ACT IN AERO: 2.0000 | INHALATION_SPRAY | Freq: Two times a day (BID) | RESPIRATORY_TRACT | 11 refills | Status: DC

## 2023-03-24 MED ORDER — BREZTRI AEROSPHERE 160-9-4.8 MCG/ACT IN AERO: 2.0000 | INHALATION_SPRAY | Freq: Two times a day (BID) | RESPIRATORY_TRACT | 0 refills | Status: DC

## 2023-03-24 NOTE — Progress Notes (Signed)
Subjective:    Patient ID: Joshua Schmidt, male    DOB: [birth year], 72 y.o.   MRN: [id number]  Patient Care Team: Margarita Mail, DO as PCP - General (Internal Medicine) Iran Ouch, MD as PCP - Cardiology (Cardiology) Rickard Patience, MD as Consulting Physician (Oncology)  Chief Complaint  Patient presents with   Follow-up    No SOB or wheezing. Occasional cough.     BACKGROUND/INTERVAL:72 year old male former smoker(quit  09/2022) seen for pulmonary consult during hospitalization June 2024 for COPD exacerbation and acute hypoxic/hypercarbic respiratory failure and Diastolic CHF decompensation.  Participates in the lung cancer CT chest screening program. Medical history significant for DVT, alcohol abuse, erythrocytosis followed by hematology. 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 Quit smoking June 2024.  HPI  Discussed the use of AI scribe software for clinical note transcription with the patient, who gave verbal consent to proceed.  History of Present Illness   The patient, with a history of severe COPD, has been managing well since quitting smoking. However, he has noticed weight gain, which he attributes to increased eating since he stopped smoking. He expresses concern about managing his weight, especially with the upcoming holidays. He enjoys cooking and has been spending more time doing so since he is no longer working.  The patient has been compliant with his current medication regimen, including Stiolto for COPD. He has not experienced significant shortness of breath, and supplemental oxygen has been beneficial. However, he expresses concern about an aneurysm that is being monitored and the potential need for surgery, which his doctor has indicated may be complicated by his COPD.  The patient has been proactive about his health, having received his flu shot and RSV shot. He has also completed a lung cancer screening CT,  which returned lung RADS 2 results. Despite these preventative measures, the patient's breathing test showed severe COPD.  He does not endorse starting any new medications.  He has not experienced any swelling, shortness of breath, or chest pains.  Despite his health concerns, the patient remains optimistic and active. He is committed to managing his health and is proactive about seeking medical care and following his treatment plan. He is scheduled to see a new cardiologist and have a stress test and EKG in the near future.     DATA 10/02/2022 2D echo: EF 60-65%, right ventricular size normal right ventricular systolic function normal 09/29/2022 Venous Dopplers: negative 09/28/2022 CT chest: neg PE , 5 mm left lower lobe nodule, dilated ascending thoracic aorta 4.5 cm. 10/02/2022 ABG: pH 7.45, pCO2 78, pO2 69, bicarb 54 01/07/2023 PFTs: FEV1 1.83 L or 48% predicted, FVC 3.02 L or 58% predicted, FEV1/FVC 61%, no bronchodilator response.  Lung volumes showed air trapping.  Diffusion capacity severely reduced.  Consistent with severe COPD on the basis of emphysema. 02/16/2023 LDCT chest: Centrilobular and paraseptal emphysema.  Multiple small lung nodules stable without suspicious pulmonary nodule or mass.  Lung RADS 2.  Thoracic aneurysm as previously noted (follows with cardiology and vascular).   Review of Systems A 10 point review of systems was performed and it is as noted above otherwise negative.   Patient Active Problem List   Diagnosis Date Noted   COPD (chronic obstructive pulmonary disease) (HCC) 11/06/2022   Chronic respiratory failure with hypoxia (HCC) 11/06/2022   Volume overload state of heart 10/05/2022   Acute on chronic diastolic CHF (congestive heart failure) (HCC) 09/28/2022   Lung nodule 09/28/2022  Myocardial injury 09/28/2022   Acute respiratory failure with hypoxia (HCC) 09/28/2022   HLD (hyperlipidemia) 09/28/2022   HTN (hypertension) 09/28/2022   COPD  exacerbation (HCC) 09/28/2022   CAD (coronary artery disease) 09/28/2022   DVT (deep venous thrombosis) (HCC) 09/28/2022   Sleep apnea 06/10/2022   History of adenomatous polyp of colon 03/19/2022   Adenomatous polyp of descending colon 03/19/2022   Traumatic tear of left rotator cuff 02/09/2022   Aneurysm of ascending aorta without rupture (HCC) 02/09/2022   Iron deficiency 10/22/2021   Varicose veins with pain 05/04/2021   Chronic venous insufficiency 05/04/2021   Acute deep vein thrombosis (DVT) of right lower extremity (HCC) 12/03/2020   Encounter for annual physical exam 09/06/2020   Alcohol abuse 09/06/2020   Bruit of left carotid artery 08/30/2020   Erythrocytosis 12/01/2018   Former smoker 10/27/2018   Sebaceous cyst 11/08/2015    Social History   Tobacco Use   Smoking status: Former    Current packs/day: 0.00    Average packs/day: 1 pack/day for 51.0 years (51.0 ttl pk-yrs)    Types: Cigarettes    Start date: 10/01/1971    Quit date: 10/01/2022    Years since quitting: 0.4   Smokeless tobacco: Never  Substance Use Topics   Alcohol use: Yes    No Known Allergies  Current Meds  Medication Sig   albuterol (VENTOLIN HFA) 108 (90 Base) MCG/ACT inhaler Inhale 2 puffs into the lungs every 6 (six) hours as needed for wheezing or shortness of breath.   aspirin 81 MG chewable tablet Chew 81 mg by mouth daily.   atorvastatin (LIPITOR) 20 MG tablet Take 1 tablet (20 mg total) by mouth daily.   Budeson-Glycopyrrol-Formoterol (BREZTRI AEROSPHERE) 160-9-4.8 MCG/ACT AERO Inhale 2 puffs into the lungs in the morning and at bedtime.   Budeson-Glycopyrrol-Formoterol (BREZTRI AEROSPHERE) 160-9-4.8 MCG/ACT AERO Inhale 2 puffs into the lungs in the morning and at bedtime.   carvedilol (COREG) 12.5 MG tablet Take 1 tablet (12.5 mg total) by mouth 2 (two) times daily.   fluticasone (FLONASE) 50 MCG/ACT nasal spray Place 2 sprays into both nostrils daily.   folic acid (FOLVITE) 1 MG  tablet Take 1 tablet (1 mg total) by mouth daily.   hydrochlorothiazide (HYDRODIURIL) 25 MG tablet Take 1 tablet (25 mg total) by mouth daily.   losartan (COZAAR) 50 MG tablet TAKE 1 TABLET BY MOUTH DAILY   Multiple Vitamin (MULTIVITAMIN WITH MINERALS) TABS tablet Take 1 tablet by mouth daily.   [DISCONTINUED] Tiotropium Bromide-Olodaterol (STIOLTO RESPIMAT) 2.5-2.5 MCG/ACT AERS Inhale 2 puffs into the lungs daily.    Immunization History  Administered Date(s) Administered   Fluad Quad(high Dose 65+) 01/13/2022   Influenza, High Dose Seasonal PF 01/12/2018   Influenza-Unspecified 01/06/2021, 12/01/2022   PFIZER(Purple Top)SARS-COV-2 Vaccination 06/03/2019, 06/27/2019, 01/11/2020, 07/26/2020   PNEUMOCOCCAL CONJUGATE-20 02/10/2021   Pfizer Covid-19 Vaccine Bivalent Booster 5y-11y 01/06/2021   Respiratory Syncytial Virus Vaccine,Recomb Aduvanted(Arexvy) 12/01/2022   Tdap 02/10/2021   Zoster Recombinant(Shingrix) 05/10/2014        Objective:    BP 128/86 (BP Location: Right Arm, Cuff Size: Large)   Pulse 78   Temp (!) 97.5 F (36.4 C)   Ht 6\' 3"  (1.905 m)   Wt 239 lb (108.4 kg)   SpO2 93%   BMI 29.87 kg/m   SpO2: 93 % O2 Device: Nasal cannula O2 Flow Rate (L/min): 2 L/min O2 Type: Pulse O2  GENERAL: Well-developed, overweight gentleman, in no acute distress.  Comfortable with nasal  cannula O2 in place.  No conversational dyspnea HEAD: Normocephalic, atraumatic.  EYES: Pupils equal, round, reactive to light.  No scleral icterus.  MOUTH: Poor dentition, oral mucosa moist.  No thrush. NECK: Supple. No thyromegaly. Trachea midline. No JVD.  No adenopathy. PULMONARY: Distant breath sounds bilaterally.  Coarse, no adventitious sounds otherwise. CARDIOVASCULAR: S1 and S2. Regular rate and rhythm.  No rubs, murmurs or gallops heard. ABDOMEN: Significant truncal obesity otherwise, benign. MUSCULOSKELETAL: No joint deformity, no clubbing, there is 1+ edema at the level of the ankles.   NEUROLOGIC: No overt focal deficit, gait not tested, speech is fluent. SKIN: Intact,warm,dry.  Chronic stasis changes noted. PSYCH: Mood and behavior normal.  Reviewed PFTs and CT chest with patient.  Assessment & Plan:     ICD-10-CM   1. Stage 3 severe COPD by GOLD classification (HCC)  J44.9 Alpha-1 antitrypsin phenotype    AMB referral to pulmonary rehabilitation    2. Chronic respiratory failure with hypoxia and hypercapnia (HCC)  J96.11 AMB referral to pulmonary rehabilitation   J96.12     3. Chronic diastolic heart failure (HCC)  Z61.09     4. Former heavy tobacco smoker  Z87.891     5. Erythrocytosis due to alveolar hypoventilation  D75.1    R06.89       Orders Placed This Encounter  Procedures   Alpha-1 antitrypsin phenotype    Standing Status:   Future    Standing Expiration Date:   03/23/2024   AMB referral to pulmonary rehabilitation    Referral Priority:   Routine    Referral Type:   Consultation    Number of Visits Requested:   1    Meds ordered this encounter  Medications   Budeson-Glycopyrrol-Formoterol (BREZTRI AEROSPHERE) 160-9-4.8 MCG/ACT AERO    Sig: Inhale 2 puffs into the lungs in the morning and at bedtime.    Dispense:  5.9 g    Refill:  0   Budeson-Glycopyrrol-Formoterol (BREZTRI AEROSPHERE) 160-9-4.8 MCG/ACT AERO    Sig: Inhale 2 puffs into the lungs in the morning and at bedtime.    Dispense:  10.7 g    Refill:  11   Discussion:    Chronic Obstructive Pulmonary Disease (COPD) Severe COPD confirmed by recent spirometry. Reports no significant dyspnea, and oxygen therapy is effective. Current medication is Stiolto; switch to [employer] recommended for improved control. Discussed benefits of Breztri, including enhanced bronchodilation and control, and the need to rinse mouth post-use to prevent thrush. Informed about twice-daily dosing of Breztri versus once-daily Stiolto. Provided samples and confirmed insurance coverage. - Discontinue  Stiolto - Initiate Breztri, two puffs twice daily - Provide Breztri samples - Ensure adequate supply of rescue inhaler - Refer to pulmonary rehab, patient prefers to start at [facility]  Alpha-1 Antitrypsin Deficiency Screening Screening for alpha-1 antitrypsin deficiency to rule out hereditary COPD component. Blood test ordered. - Order blood test for alpha-1 antitrypsin deficiency  Thoracic Aortic Aneurysm Aortic aneurysm under surveillance. Concerns about potential surgery due to COPD severity. Pulmonary rehab recommended to optimize surgical outcomes and lung function. - Refer to pulmonary rehab, preferably at [facility] Maintenance Up to date on influenza, pneumococcal, and RSV vaccinations. Discussed dietary modifications to manage weight gain post-smoking cessation, emphasizing protein-rich foods and healthy fats while limiting carbohydrates. - Continue current vaccination schedule - Advise on dietary modifications to manage weight  Follow-up - Follow up in three months.     Advised if symptoms do not improve or worsen, to please  contact office for sooner follow up or seek emergency care.    I spent 42 minutes of dedicated to the care of this patient on the date of this encounter to include pre-visit review of records, face-to-face time with the patient discussing conditions above, post visit ordering of testing, clinical documentation with the electronic health record, making appropriate referrals as documented, and communicating necessary findings to members of the patients care team.     C. Danice Goltz, MD Advanced Bronchoscopy PCCM [facility]    *This note was generated using voice recognition software/Dragon and/or AI transcription program.  Despite best efforts to proofread, errors can occur which can change the meaning. Any transcriptional errors that result from this process are unintentional and may not be fully corrected at the time of  dictation.

## 2023-03-24 NOTE — Patient Instructions (Signed)
VISIT SUMMARY:  During today's visit, we discussed your ongoing management of severe COPD, your recent weight gain, and your concerns about an aortic aneurysm. We reviewed your current medications and preventative measures, including your recent vaccinations and lung cancer screening results. We also talked about your upcoming appointments with a new cardiologist and planned tests.  YOUR PLAN:  -CHRONIC OBSTRUCTIVE PULMONARY DISEASE (COPD): COPD is a chronic lung condition that makes it hard to breathe. We confirmed your severe COPD with a recent breathing test. You will switch from Stiolto to [place] for better control. Breztri should be taken as two puffs twice daily and remember to rinse your mouth after use to prevent thrush. We provided samples and confirmed your insurance coverage. You should also ensure you have an adequate supply of your rescue inhaler. Additionally, we recommend pulmonary rehab.  -ALPHA-1 ANTITRYPSIN DEFICIENCY SCREENING: Alpha-1 antitrypsin deficiency is a genetic condition that can cause COPD. We ordered a blood test to screen for this deficiency to rule out a hereditary component to your COPD.  -AORTIC ANEURYSM: An aortic aneurysm is an abnormal bulge in the wall of the aorta. We are monitoring your aneurysm, and due to your COPD, surgery may be complicated. Pulmonary rehab is recommended to improve your lung function and optimize outcomes of potential surgical interventions.  -GENERAL HEALTH MAINTENANCE: You are up to date on your influenza, pneumococcal, and RSV vaccinations. We discussed dietary changes to help manage your weight gain since quitting smoking. Focus on eating protein-rich foods and healthy fats while limiting carbohydrates.  INSTRUCTIONS:  Please follow up in three months. Continue with your current vaccination schedule and attend your upcoming appointments with the new cardiologist. If you experience any worsening symptoms, return to the hospital  immediately.

## 2023-03-25 DIAGNOSIS — J441 Chronic obstructive pulmonary disease with (acute) exacerbation: Secondary | ICD-10-CM | POA: Diagnosis not present

## 2023-03-25 DIAGNOSIS — I5033 Acute on chronic diastolic (congestive) heart failure: Secondary | ICD-10-CM | POA: Diagnosis not present

## 2023-03-25 DIAGNOSIS — J9601 Acute respiratory failure with hypoxia: Secondary | ICD-10-CM | POA: Diagnosis not present

## 2023-04-02 LAB — ALPHA-1-ANTITRYPSIN PHENOTYP: A-1 Antitrypsin, Ser: 129 mg/dL (ref 101–187)

## 2023-04-05 DIAGNOSIS — J45909 Unspecified asthma, uncomplicated: Secondary | ICD-10-CM | POA: Diagnosis not present

## 2023-04-25 DIAGNOSIS — J441 Chronic obstructive pulmonary disease with (acute) exacerbation: Secondary | ICD-10-CM | POA: Diagnosis not present

## 2023-04-25 DIAGNOSIS — J9601 Acute respiratory failure with hypoxia: Secondary | ICD-10-CM | POA: Diagnosis not present

## 2023-04-25 DIAGNOSIS — I5033 Acute on chronic diastolic (congestive) heart failure: Secondary | ICD-10-CM | POA: Diagnosis not present

## 2023-05-06 DIAGNOSIS — J45909 Unspecified asthma, uncomplicated: Secondary | ICD-10-CM | POA: Diagnosis not present

## 2023-05-11 ENCOUNTER — Encounter: Payer: Self-pay | Admitting: *Deleted

## 2023-05-19 NOTE — Progress Notes (Deleted)
 Office Note     CC: Aortoiliac aneurysmal disease Requesting Provider:  Bernardo Fend, DO  HPI: Joshua Schmidt is a 73 y.o. (07-19-1950) male presenting in follow-up with known aortoiliac aneurysmal disease.  On exam today, [name] was doing well.  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   Since last seen, [name] was admitted for acute respiratory failure with acute on chronic diastolic congestive heart failure, COPD exacerbation.  He is now oxygen  dependent, 2 L.  States that with the oxygen , he feels 20 years younger.  He stated he felt like symptoms had been progressive over the last year.  He has not smoked since his hospitalization in June.  Regarding his aortoiliac aneurysmal disease, he denies back pain, abdominal pain, chest pain, pelvic pain. He continues to be active in his garden on a daily basis.  Vascular/venous surgical history includes bilateral venous ablation at another office.  His main concern today was continued lower extremity swelling present at the ankles. No family history of aneurysmal disease.   The pt is  on a statin for cholesterol management.  The pt is  on a daily aspirin .   Other AC:  - The pt is  on medication for hypertension.   The pt is not diabetic.  Tobacco hx:  current- cutting back   Past Medical History:  Diagnosis Date   Aneurysm (HCC)    COPD (chronic obstructive pulmonary disease) (HCC)    Erythrocytosis 12/01/2018   Hyperlipidemia    Hypertension    Myocardial infarction [facility])     Past Surgical History:  Procedure Laterality Date   APPENDECTOMY     COLONOSCOPY WITH PROPOFOL  N/A 11/20/2014   Procedure: COLONOSCOPY WITH PROPOFOL ;  Surgeon: Louanne KANDICE Muse, MD;  Location: [facility] ENDOSCOPY;  Service: Endoscopy;  Laterality: N/A;   COLONOSCOPY WITH PROPOFOL  N/A 03/19/2022   Procedure: COLONOSCOPY WITH PROPOFOL ;  Surgeon: Unk Corinn Skiff, MD;  Location: [facility] ENDOSCOPY;  Service: Gastroenterology;   Laterality: N/A;   TONSILLECTOMY AND ADENOIDECTOMY      Social History   Socioeconomic History   Marital status: Married    Spouse name: Not on file   Number of children: Not on file   Years of education: Not on file   Highest education level: Not on file  Occupational History   Not on file  Tobacco Use   Smoking status: Former    Current packs/day: 0.00    Average packs/day: 1 pack/day for 51.0 years (51.0 ttl pk-yrs)    Types: Cigarettes    Start date: 10/01/1971    Quit date: 10/01/2022    Years since quitting: 0.6   Smokeless tobacco: Never  Vaping Use   Vaping status: Never Used  Substance and Sexual Activity   Alcohol use: Yes   Drug use: No   Sexual activity: Not Currently  Other Topics Concern   Not on file  Social History Narrative   Not on file   Social Drivers of Health   Financial Resource Strain: Low Risk  (10/16/2022)   Overall Financial Resource Strain (CARDIA)    Difficulty of Paying Living Expenses: Not hard at all  Food Insecurity: No Food Insecurity (10/16/2022)   Hunger Vital Sign    Worried About Running Out of Food in the Last Year: Never true    Ran Out of Food in the Last Year: Never true  Transportation Needs: No Transportation Needs (10/16/2022)   PRAPARE - Transportation    Lack of  Transportation (Medical): No    Lack of Transportation (Non-Medical): No  Physical Activity: Sufficiently Active (10/16/2022)   Exercise Vital Sign    Days of Exercise per Week: 3 days    Minutes of Exercise per Session: 90 min  Stress: No Stress Concern Present (10/16/2022)   [employer] of Occupational Health - Occupational Stress Questionnaire    Feeling of Stress : Only a little  Social Connections: Moderately Integrated (10/16/2022)   Social Connection and Isolation Panel [NHANES]    Frequency of Communication with Friends and Family: Three times a week    Frequency of Social Gatherings with Friends and Family: More than three times a week    Attends  Religious Services: More than 4 times per year    Active Member of [employer] or Organizations: No    Attends Banker Meetings: Never    Marital Status: Married  Catering Manager Violence: Not At Risk (10/16/2022)   Humiliation, Afraid, Rape, and Kick questionnaire    Fear of Current or Ex-Partner: No    Emotionally Abused: No    Physically Abused: No    Sexually Abused: No   No family history on file.  Current Outpatient Medications  Medication Sig Dispense Refill   albuterol  (VENTOLIN  HFA) 108 (90 Base) MCG/ACT inhaler Inhale 2 puffs into the lungs every 6 (six) hours as needed for wheezing or shortness of breath. 8 g 2   aspirin  81 MG chewable tablet Chew 81 mg by mouth daily.     atorvastatin  (LIPITOR) 20 MG tablet Take 1 tablet (20 mg total) by mouth daily. 100 tablet 2   Budeson-Glycopyrrol-Formoterol  (BREZTRI  AEROSPHERE) 160-9-4.8 MCG/ACT AERO Inhale 2 puffs into the lungs in the morning and at bedtime. 5.9 g 0   Budeson-Glycopyrrol-Formoterol  (BREZTRI  AEROSPHERE) 160-9-4.8 MCG/ACT AERO Inhale 2 puffs into the lungs in the morning and at bedtime. 10.7 g 11   carvedilol  (COREG ) 12.5 MG tablet Take 1 tablet (12.5 mg total) by mouth 2 (two) times daily. 180 tablet 3   fluticasone  (FLONASE ) 50 MCG/ACT nasal spray Place 2 sprays into both nostrils daily. 16 g 6   folic acid  (FOLVITE ) 1 MG tablet Take 1 tablet (1 mg total) by mouth daily. 90 tablet 0   hydrochlorothiazide  (HYDRODIURIL ) 25 MG tablet Take 1 tablet (25 mg total) by mouth daily. 90 tablet 1   losartan  (COZAAR ) 50 MG tablet TAKE 1 TABLET BY MOUTH DAILY 100 tablet 1   Multiple Vitamin (MULTIVITAMIN WITH MINERALS) TABS tablet Take 1 tablet by mouth daily. 90 tablet 0   No current facility-administered medications for this visit.    No Known Allergies   REVIEW OF SYSTEMS:   [X]  denotes positive finding, [ ]  denotes negative finding Cardiac  Comments:  Chest pain or chest pressure:    Shortness of breath upon  exertion:    Short of breath when lying flat:    Irregular heart rhythm:        Vascular    Pain in calf, thigh, or hip brought on by ambulation:    Pain in feet at night that wakes you up from your sleep:     Blood clot in your veins:    Leg swelling:  X ankles      Pulmonary    Oxygen  at home:    Productive cough:     Wheezing:         Neurologic    Sudden weakness in arms or legs:     Sudden numbness in  arms or legs:     Sudden onset of difficulty speaking or slurred speech:    Temporary loss of vision in one eye:     Problems with dizziness:         Gastrointestinal    Blood in stool:     Vomited blood:         Genitourinary    Burning when urinating:     Blood in urine:        Psychiatric    Major depression:         Hematologic    Bleeding problems:    Problems with blood clotting too easily:        Skin    Rashes or ulcers:        Constitutional    Fever or chills:      PHYSICAL EXAMINATION:  There were no vitals filed for this visit.  General:  WDWN in NAD; vital signs documented above Gait: Not observed HENT: WNL, normocephalic Pulmonary: normal non-labored breathing , without wheezing Cardiac: regular HR Abdomen: soft, NT, no masses Skin: without rashes Vascular Exam/Pulses:  Right Left  Radial 2+ (normal) 2+ (normal)  Ulnar    Femoral 2+ (normal) 2+ (normal)  Popliteal    DP 2+ (normal) 2+ (normal)  PT     Extremities: without ischemic changes, without Gangrene , without cellulitis; without open wounds;  Musculoskeletal: no muscle wasting or atrophy  Neurologic: A&O X 3;  No focal weakness or paresthesias are detected Psychiatric:  The pt has Normal affect.   Non-Invasive Vascular Imaging:     Abdominal Aorta Findings:  +-------------+-------+----------+----------+--------+--------+--------+  Location    AP (cm)Trans (cm)PSV (cm/s)WaveformThrombusComments   +-------------+-------+----------+----------+--------+--------+--------+  Proximal    2.46   2.63      136                                 +-------------+-------+----------+----------+--------+--------+--------+  Mid         3.60   4.09                                          +-------------+-------+----------+----------+--------+--------+--------+  RT CIA Distal3.4    3.2                                           +-------------+-------+----------+----------+--------+--------+--------+  LT CIA Prox  1.5    1.6                                           +-------------+-------+----------+----------+--------+--------+--------+  LT CIA Distal1.9    1.8                                           +-------------+-------+----------+----------+--------+--------+--------+      ASSESSMENT/PLAN: KATE SWEETMAN is a 73 y.o. male presenting with aortoiliac aneurysmal disease.  Noted iliac duplex ultrasound today demonstrated 4 cm AAA, 3.4 cm right common iliac artery aneurysm.  No significant growth since last seen..  I had a long discussion with  Tom regarding the natural history of aortoiliac aneurysmal disease, as well as the treatment modalities associated.  We discussed that at his current sizes, we can continue to watch the aneurysms as the risk of rupture is less than the risk of surgery.  Should the iliac artery aneurysms reaches size greater than 3.5 cm, or the infrarenal abdominal aneurysm reaches size greater than 5.5 cm we would discuss elective repair.  [name] has a previous history of abdominal surgery through a midline laparotomy. From his most recent CT scan, he appears to be a candidate for endovascular approach.  We discussed what this surgery is comprised of.    He is aware that now that he is oxygen  dependent, his risk profile has increased.  Prior to pursuing surgery, I would ask for cardiology clearance.   My plan is to see, [name] every 6 months with  aortogram iliac duplex ultrasound.  At the time of his next visit, I will also evaluate his bilateral popliteal arteries to ensure there is no aneurysmal dilatation.    We discussed the signs and symptoms of rupture, and I asked him to call 911 immediately should any of these occur.  I asked that he continue his aspirin  and statin therapy.    Fonda FORBES Rim, MD [facility] [phone]

## 2023-05-20 ENCOUNTER — Ambulatory Visit (HOSPITAL_COMMUNITY): Payer: 59 | Attending: Vascular Surgery

## 2023-05-20 ENCOUNTER — Ambulatory Visit (HOSPITAL_COMMUNITY): Payer: 59

## 2023-05-20 ENCOUNTER — Ambulatory Visit: Payer: 59 | Admitting: Vascular Surgery

## 2023-05-21 ENCOUNTER — Encounter (HOSPITAL_COMMUNITY): Payer: 59

## 2023-05-21 ENCOUNTER — Ambulatory Visit: Payer: 59 | Admitting: Vascular Surgery

## 2023-05-21 ENCOUNTER — Other Ambulatory Visit (HOSPITAL_COMMUNITY): Payer: 59

## 2023-05-26 DIAGNOSIS — I5033 Acute on chronic diastolic (congestive) heart failure: Secondary | ICD-10-CM | POA: Diagnosis not present

## 2023-05-26 DIAGNOSIS — J441 Chronic obstructive pulmonary disease with (acute) exacerbation: Secondary | ICD-10-CM | POA: Diagnosis not present

## 2023-05-26 DIAGNOSIS — J9601 Acute respiratory failure with hypoxia: Secondary | ICD-10-CM | POA: Diagnosis not present

## 2023-06-06 DIAGNOSIS — J45909 Unspecified asthma, uncomplicated: Secondary | ICD-10-CM | POA: Diagnosis not present

## 2023-06-17 ENCOUNTER — Ambulatory Visit: Payer: 59 | Admitting: Internal Medicine

## 2023-06-23 DIAGNOSIS — I5033 Acute on chronic diastolic (congestive) heart failure: Secondary | ICD-10-CM | POA: Diagnosis not present

## 2023-06-23 DIAGNOSIS — J9601 Acute respiratory failure with hypoxia: Secondary | ICD-10-CM | POA: Diagnosis not present

## 2023-06-23 DIAGNOSIS — J441 Chronic obstructive pulmonary disease with (acute) exacerbation: Secondary | ICD-10-CM | POA: Diagnosis not present

## 2023-06-24 ENCOUNTER — Ambulatory Visit (INDEPENDENT_AMBULATORY_CARE_PROVIDER_SITE_OTHER): Payer: 59 | Admitting: Pulmonary Disease

## 2023-06-24 ENCOUNTER — Encounter: Payer: Self-pay | Admitting: Pulmonary Disease

## 2023-06-24 VITALS — BP 124/78 | HR 68 | Temp 97.1°F | Ht 75.0 in | Wt 224.8 lb

## 2023-06-24 DIAGNOSIS — I5032 Chronic diastolic (congestive) heart failure: Secondary | ICD-10-CM

## 2023-06-24 DIAGNOSIS — Z87891 Personal history of nicotine dependence: Secondary | ICD-10-CM | POA: Diagnosis not present

## 2023-06-24 DIAGNOSIS — J449 Chronic obstructive pulmonary disease, unspecified: Secondary | ICD-10-CM | POA: Diagnosis not present

## 2023-06-24 DIAGNOSIS — J9611 Chronic respiratory failure with hypoxia: Secondary | ICD-10-CM

## 2023-06-24 DIAGNOSIS — D751 Secondary polycythemia: Secondary | ICD-10-CM | POA: Diagnosis not present

## 2023-06-24 DIAGNOSIS — J9612 Chronic respiratory failure with hypercapnia: Secondary | ICD-10-CM

## 2023-06-24 DIAGNOSIS — R0689 Other abnormalities of breathing: Secondary | ICD-10-CM

## 2023-06-24 DIAGNOSIS — Z9981 Dependence on supplemental oxygen: Secondary | ICD-10-CM | POA: Diagnosis not present

## 2023-06-24 NOTE — Progress Notes (Signed)
 Subjective:    Patient ID: Joshua Schmidt, male    DOB: [birth year], 73 y.o.   MRN: [id number]  Patient Care Team: Margarita Mail, DO as PCP - General (Internal Medicine) Iran Ouch, MD as PCP - Cardiology (Cardiology) Rickard Patience, MD as Consulting Physician (Oncology)  Chief Complaint  Patient presents with   Follow-up    No Wheezing or cough. DOE.    BACKGROUND/INTERVAL:73 year old male former smoker(quit 09/2022) seen as pulmonary consult during hospitalization June 2024 for COPD exacerbation and acute hypoxic/hypercarbic respiratory failure and Diastolic CHF decompensation.  Last seen here 24 March 2023.  On Breztri, albuterol as needed and supplemental O2. Participates in the lung cancer CT chest screening program. Medical history significant for DVT, alcohol abuse, erythrocytosis followed by hematology. 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 Quit smoking June 2024.  HPI Discussed the use of AI scribe software for clinical note transcription with the patient, who gave verbal consent to proceed.  History of Present Illness   The patient, with severe COPD and chronic respiratory failure, presents for follow-up.  He consistently uses supplemental oxygen but occasionally removes it while working in the garden for about 45 minutes to an hour. During these times, he takes breaks and returns to his building to use oxygen for 20 to 30 minutes. He monitors his oxygen levels, which have dropped to as low as 87-88%.  He uses Breztri inhaler 2 puffs in the morning but not at night as prescribed. He also has an emergency inhaler, which he uses as needed.  He recalls a past incident involving his heart that required hospitalization, but he has not had further issues since then. He has been monitored by a cardiologist and was referred to another specialist due to a thoracic aneurysm that is approaching a critical size.  Regarding  sleep, he reports sleeping well currently, although he has a history of PTSD that previously affected his sleep. He mentions being a restless sleeper, sometimes talking in his sleep, and occasionally waking up to find his oxygen tubing dislodged. He attributes some of his sleep disturbances to PTSD triggers, which can take a few days to resolve. No current sleep problems but notes past issues related to PTSD.       DATA 10/02/2022 2D echo: EF 60-65%, right ventricular size normal right ventricular systolic function normal 09/29/2022 Venous Dopplers: negative 09/28/2022 CT chest: neg PE , 5 mm left lower lobe nodule, dilated ascending thoracic aorta 4.5 cm. 10/02/2022 ABG: pH 7.45, pCO2 78, pO2 69, bicarb 54 01/07/2023 PFTs: FEV1 1.83 L or 48% predicted, FVC 3.02 L or 58% predicted, FEV1/FVC 61%, no bronchodilator response.  Lung volumes showed air trapping.  Diffusion capacity severely reduced.  Consistent with severe COPD on the basis of emphysema. 02/16/2023 LDCT chest: Centrilobular and paraseptal emphysema.  Multiple small lung nodules stable without suspicious pulmonary nodule or mass.  Lung RADS 2.  Thoracic aneurysm as previously noted (follows with cardiology and vascular). 03/24/2023 alpha 1 antitrypsin: Phenotype MM, level 129 mg/dL (normal)  Review of Systems A 10 point review of systems was performed and it is as noted above otherwise negative.   Patient Active Problem List   Diagnosis Date Noted   COPD (chronic obstructive pulmonary disease) (HCC) 11/06/2022   Chronic respiratory failure with hypoxia (HCC) 11/06/2022   Volume overload state of heart 10/05/2022   Acute on chronic diastolic CHF (congestive heart failure) (HCC) 09/28/2022   Lung nodule 09/28/2022  Myocardial injury 09/28/2022   Acute respiratory failure with hypoxia (HCC) 09/28/2022   HLD (hyperlipidemia) 09/28/2022   HTN (hypertension) 09/28/2022   COPD exacerbation (HCC) 09/28/2022   CAD (coronary artery  disease) 09/28/2022   DVT (deep venous thrombosis) (HCC) 09/28/2022   Sleep apnea 06/10/2022   History of adenomatous polyp of colon 03/19/2022   Adenomatous polyp of descending colon 03/19/2022   Traumatic tear of left rotator cuff 02/09/2022   Aneurysm of ascending aorta without rupture (HCC) 02/09/2022   Iron deficiency 10/22/2021   Varicose veins with pain 05/04/2021   Chronic venous insufficiency 05/04/2021   Acute deep vein thrombosis (DVT) of right lower extremity (HCC) 12/03/2020   Encounter for annual physical exam 09/06/2020   Alcohol abuse 09/06/2020   Bruit of left carotid artery 08/30/2020   Erythrocytosis 12/01/2018   Former smoker 10/27/2018   Sebaceous cyst 11/08/2015    Social History   Tobacco Use   Smoking status: Former    Current packs/day: 0.00    Average packs/day: 1 pack/day for 51.0 years (51.0 ttl pk-yrs)    Types: Cigarettes    Start date: 10/01/1971    Quit date: 10/01/2022    Years since quitting: 0.7   Smokeless tobacco: Never  Substance Use Topics   Alcohol use: Yes    No Known Allergies  Current Meds  Medication Sig   aspirin 81 MG chewable tablet Chew 81 mg by mouth daily.   atorvastatin (LIPITOR) 20 MG tablet Take 1 tablet (20 mg total) by mouth daily.   Budeson-Glycopyrrol-Formoterol (BREZTRI AEROSPHERE) 160-9-4.8 MCG/ACT AERO Inhale 2 puffs into the lungs in the morning and at bedtime.   carvedilol (COREG) 12.5 MG tablet Take 1 tablet (12.5 mg total) by mouth 2 (two) times daily.   fluticasone (FLONASE) 50 MCG/ACT nasal spray Place 2 sprays into both nostrils daily.   folic acid (FOLVITE) 1 MG tablet Take 1 tablet (1 mg total) by mouth daily.   hydrochlorothiazide (HYDRODIURIL) 25 MG tablet Take 1 tablet (25 mg total) by mouth daily.   losartan (COZAAR) 50 MG tablet TAKE 1 TABLET BY MOUTH DAILY   Multiple Vitamin (MULTIVITAMIN WITH MINERALS) TABS tablet Take 1 tablet by mouth daily.   [DISCONTINUED] albuterol (VENTOLIN HFA) 108 (90  Base) MCG/ACT inhaler Inhale 2 puffs into the lungs every 6 (six) hours as needed for wheezing or shortness of breath.    Immunization History  Administered Date(s) Administered   Fluad Quad(high Dose 65+) 01/13/2022   Influenza, High Dose Seasonal PF 01/12/2018   Influenza-Unspecified 01/06/2021, 12/01/2022   PFIZER(Purple Top)SARS-COV-2 Vaccination 06/03/2019, 06/27/2019, 01/11/2020, 07/26/2020   PNEUMOCOCCAL CONJUGATE-20 02/10/2021   Pfizer Covid-19 Vaccine Bivalent Booster 5y-11y 01/06/2021   Respiratory Syncytial Virus Vaccine,Recomb Aduvanted(Arexvy) 12/01/2022   Tdap 02/10/2021   Zoster Recombinant(Shingrix) 05/10/2014        Objective:     BP 124/78 (BP Location: Right Arm, Cuff Size: Large)   Pulse 68   Temp (!) 97.1 F (36.2 C)   Ht 6\' 3"  (1.905 m)   Wt 224 lb 12.8 oz (102 kg)   SpO2 90%   BMI 28.10 kg/m   SpO2: 90 % O2 Device: Nasal cannula O2 Flow Rate (L/min): 2 L/min O2 Type: Pulse O2  GENERAL: Well-developed, overweight gentleman, in no acute distress.  Comfortable with nasal cannula O2 in place.  No conversational dyspnea.  Plethoric appearing. HEAD: Normocephalic, atraumatic.  EYES: Pupils equal, round, reactive to light.  No scleral icterus.  MOUTH: Poor dentition, oral mucosa moist.  No thrush. NECK: Supple. No thyromegaly. Trachea midline. No JVD.  No adenopathy. PULMONARY: Distant breath sounds bilaterally.  Coarse, no adventitious sounds otherwise. CARDIOVASCULAR: S1 and S2. Regular rate and rhythm.  No rubs, murmurs or gallops heard. ABDOMEN: Significant truncal obesity otherwise, benign. MUSCULOSKELETAL: No joint deformity, no clubbing, there is 1+ edema at the level of the ankles.  NEUROLOGIC: No overt focal deficit, gait not tested, speech is fluent. SKIN: Intact,warm,dry.  Chronic stasis changes noted. PSYCH: Mood and behavior normal.    Assessment & Plan:     ICD-10-CM   1. Stage 3 severe COPD by GOLD classification (HCC)  J44.9      2. Chronic respiratory failure with hypoxia and hypercapnia (HCC)  J96.11    J96.12     3. Chronic diastolic heart failure (HCC)  Z61.09     4. Erythrocytosis due to alveolar hypoventilation  D75.1    R06.89     5. Former heavy tobacco smoker  Z87.891      Discussion:    Chronic Obstructive Pulmonary Disease (COPD) with chronic respiratory failure Severe COPD with chronic respiratory failure, characterized by hypoxia and hypercarbia. He intermittently removes supplemental oxygen during activities, causing oxygen saturation to drop to 87-88%. He uses Breztri inhaler twice in the morning but not at night, contrary to the recommended dosing schedule. No sleep disturbances related to COPD, but the oxygen cannula dislodges during sleep. The goal is to maintain adequate oxygen levels to avoid cardiac stress. - Instruct to use Breztri inhaler twice daily, morning and evening. - Advise consistent use of supplemental oxygen, especially during activities. - Suggest using paper or skin tape to secure the oxygen cannula during sleep. - Schedule follow-up in four months.  Thoracic aortic aneurysm Thoracic aortic aneurysm approaching 5 cm, raising concern for intervention. Low oxygen levels complicate surgical options, making traditional surgery risky. Endovascular stent placement is considered a safer alternative, pending further cardiology evaluation. The aneurysm is nearing the size where intervention is typically required. - Continue monitoring aneurysm size and consult with cardiology for potential endovascular stent placement.  Cardiac issues Cardiac issues previously evaluated by cardiologists Dr. Frann Rider and Dr. Sherral Hammers. The cardiac condition requires ongoing monitoring due to the thoracic aortic aneurysm and associated risks with his respiratory condition. - Continue follow-up with cardiology for cardiac monitoring and management of the thoracic aortic aneurysm.  Post-Traumatic Stress  Disorder (PTSD) PTSD occasionally affects sleep and causes restlessness. Manages symptoms by walking or going outside until calm.     Advised if symptoms do not improve or worsen, to please contact office for sooner follow up or seek emergency care.    I spent 33 minutes of dedicated to the care of this patient on the date of this encounter to include pre-visit review of records, face-to-face time with the patient discussing conditions above, post visit ordering of testing, clinical documentation with the electronic health record, making appropriate referrals as documented, and communicating necessary findings to members of the patients care team.     C. Danice Goltz, MD Advanced Bronchoscopy P[facility]    *This note was generated using voice recognition software/Dragon and/or AI transcription program.  Despite best efforts to proofread, errors can occur which can change the meaning. Any transcriptional errors that result from this process are unintentional and may not be fully corrected at the time of dictation.

## 2023-06-24 NOTE — Patient Instructions (Addendum)
 VISIT SUMMARY:  You came in today for a follow-up visit regarding your severe COPD and chronic respiratory failure. We discussed your current use of supplemental oxygen, your inhaler regimen, and your sleep patterns. We also reviewed your thoracic aortic aneurysm and past cardiac issues. Additionally, we touched on your history of PTSD and its impact on your sleep.  YOUR PLAN:  -CHRONIC OBSTRUCTIVE PULMONARY DISEASE (COPD) WITH CHRONIC RESPIRATORY FAILURE: COPD is a chronic lung condition that makes it hard to breathe and can lead to low oxygen levels. You should use your Breztri inhaler twice daily, in the morning and evening, and consistently use your supplemental oxygen, especially during activities. To prevent your oxygen cannula from dislodging during sleep, consider using paper or skin tape to secure it.  -THORACIC AORTIC ANEURYSM: A thoracic aortic aneurysm is an enlargement of the upper part of the aorta, which can be dangerous if it gets too large.  This is being followed by your vascular surgeon.  Consideration a safer endovascular stent placement may be necessary.  -CARDIAC ISSUES: You have a history of heart problems that require ongoing monitoring, especially due to your thoracic aortic aneurysm and respiratory condition. Continue your follow-ups with cardiology for proper management.  -POST-TRAUMATIC STRESS DISORDER (PTSD): PTSD is a mental health condition triggered by a traumatic event, which can affect your sleep and cause restlessness. You manage your symptoms by walking or going outside until you feel calm.  INSTRUCTIONS:  Please schedule a follow-up appointment in four months. Continue to monitor your oxygen levels and use your inhaler as prescribed. Follow up with your cardiologist for ongoing monitoring of your heart and aneurysm.

## 2023-07-04 DIAGNOSIS — J45909 Unspecified asthma, uncomplicated: Secondary | ICD-10-CM | POA: Diagnosis not present

## 2023-07-06 ENCOUNTER — Other Ambulatory Visit: Payer: Self-pay | Admitting: Internal Medicine

## 2023-07-06 ENCOUNTER — Telehealth: Payer: Self-pay | Admitting: Internal Medicine

## 2023-07-06 DIAGNOSIS — J449 Chronic obstructive pulmonary disease, unspecified: Secondary | ICD-10-CM

## 2023-07-06 NOTE — Telephone Encounter (Signed)
 Refill from [facility]  Budeson-Glycopyrrol-Formoterol (BREZTRI AEROSPHERE) 160-9-4.8 MCG/ACT AERO

## 2023-07-07 ENCOUNTER — Other Ambulatory Visit: Payer: Self-pay | Admitting: Emergency Medicine

## 2023-07-07 NOTE — Telephone Encounter (Signed)
 Order sent to [facility]

## 2023-07-08 NOTE — Telephone Encounter (Signed)
 Per chart review, pt was to return in March, but pt was no show. Called pt and LM on VM to call back to make appt to reschedule the f/u that PCP requested.  Will refill Albuterol. Requested Prescriptions  Pending Prescriptions Disp Refills   VENTOLIN HFA 108 (90 Base) MCG/ACT inhaler [Pharmacy Med Name: Ventolin HFA 108 (90 Base) MCG/ACT Inhalation Aerosol Solution] 54 g 3    Sig: USE 2 INHALATIONS BY MOUTH EVERY 6 HOURS AS NEEDED FOR WHEEZING  OR SHORTNESS OF BREATH     Pulmonology:  Beta Agonists 2 Failed - 07/08/2023  9:49 AM      Failed - Valid encounter within last 12 months    Recent Outpatient Visits   None            Passed - Last BP in normal range    BP Readings from Last 1 Encounters:  06/24/23 124/78         Passed - Last Heart Rate in normal range    Pulse Readings from Last 1 Encounters:  06/24/23 68

## 2023-07-09 MED ORDER — BREZTRI AEROSPHERE 160-9-4.8 MCG/ACT IN AERO: 2.0000 | INHALATION_SPRAY | Freq: Two times a day (BID) | RESPIRATORY_TRACT | Status: DC

## 2023-07-12 NOTE — Progress Notes (Unsigned)
 Office Note     CC: Aortoiliac aneurysmal disease Requesting Provider:  Margarita Mail, DO  HPI: Joshua Schmidt is a 73 y.o. (12/27/1950) male presenting in follow-up with known aortoiliac aneurysmal disease.  On exam today, Joshua Schmidt was doing well.  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  Kanin been doing well since last seen.  At his last visit, he had acute respiratory failure with acute on chronic diastolic congestive heart failure with COPD exacerbation.  Since that time, he has been oxygen dependent-2 L.  He states the oxygen has helped him dramatically.  He has not smoked in nearly a year.  Regarding his aortoiliac aneurysmal disease, he denies back pain, abdominal pain, chest pain, pelvic pain. He continues to be active in his garden on a daily basis.  Vascular/venous surgical history includes bilateral venous ablation at another office.  His main concern today was continued lower extremity swelling present at the ankles. No family history of aneurysmal disease.   The pt is  on a statin for cholesterol management.  The pt is  on a daily aspirin.   Other AC:  - The pt is  on medication for hypertension.   The pt is not diabetic.  Tobacco hx:  current- cutting back   Past Medical History:  Diagnosis Date   Aneurysm (HCC)    COPD (chronic obstructive pulmonary disease) (HCC)    Erythrocytosis 12/01/2018   Hyperlipidemia    Hypertension    Myocardial infarction [facility])     Past Surgical History:  Procedure Laterality Date   APPENDECTOMY     COLONOSCOPY WITH PROPOFOL N/A 11/20/2014   Procedure: COLONOSCOPY WITH PROPOFOL;  Surgeon: Kieth Brightly, MD;  Location: [facility] ENDOSCOPY;  Service: Endoscopy;  Laterality: N/A;   COLONOSCOPY WITH PROPOFOL N/A 03/19/2022   Procedure: COLONOSCOPY WITH PROPOFOL;  Surgeon: Toney Reil, MD;  Location: [facility] ENDOSCOPY;  Service: Gastroenterology;  Laterality: N/A;   TONSILLECTOMY AND ADENOIDECTOMY       Social History   Socioeconomic History   Marital status: Married    Spouse name: Not on file   Number of children: Not on file   Years of education: Not on file   Highest education level: Not on file  Occupational History   Not on file  Tobacco Use   Smoking status: Former    Current packs/day: 0.00    Average packs/day: 1 pack/day for 51.0 years (51.0 ttl pk-yrs)    Types: Cigarettes    Start date: 10/01/1971    Quit date: 10/01/2022    Years since quitting: 0.7   Smokeless tobacco: Never  Vaping Use   Vaping status: Never Used  Substance and Sexual Activity   Alcohol use: Yes   Drug use: No   Sexual activity: Not Currently  Other Topics Concern   Not on file  Social History Narrative   Not on file   Social Drivers of Health   Financial Resource Strain: Low Risk  (10/16/2022)   Overall Financial Resource Strain (CARDIA)    Difficulty of Paying Living Expenses: Not hard at all  Food Insecurity: No Food Insecurity (10/16/2022)   Hunger Vital Sign    Worried About Running Out of Food in the Last Year: Never true    Ran Out of Food in the Last Year: Never true  Transportation Needs: No Transportation Needs (10/16/2022)   PRAPARE - Administrator, Civil Service (Medical): No    Lack of Transportation (Non-Medical):  No  Physical Activity: Sufficiently Active (10/16/2022)   Exercise Vital Sign    Days of Exercise per Week: 3 days    Minutes of Exercise per Session: 90 min  Stress: No Stress Concern Present (10/16/2022)   Joshua Schmidt of Occupational Health - Occupational Stress Questionnaire    Feeling of Stress : Only a little  Social Connections: Moderately Integrated (10/16/2022)   Social Connection and Isolation Panel [NHANES]    Frequency of Communication with Friends and Family: Three times a week    Frequency of Social Gatherings with Friends and Family: More than three times a week    Attends Religious Services: More than 4 times per year    Active  Member of [employer] or Organizations: No    Attends Banker Meetings: Never    Marital Status: Married  Catering manager Violence: Not At Risk (10/16/2022)   Humiliation, Afraid, Rape, and Kick questionnaire    Fear of Current or Ex-Partner: No    Emotionally Abused: No    Physically Abused: No    Sexually Abused: No   No family history on file.  Current Outpatient Medications  Medication Sig Dispense Refill   aspirin 81 MG chewable tablet Chew 81 mg by mouth daily.     atorvastatin (LIPITOR) 20 MG tablet Take 1 tablet (20 mg total) by mouth daily. 100 tablet 2   Budeson-Glycopyrrol-Formoterol (BREZTRI AEROSPHERE) 160-9-4.8 MCG/ACT AERO Inhale 2 puffs into the lungs in the morning and at bedtime. 10.7 g 11   budeson-glycopyrrolate-formoterol (BREZTRI AEROSPHERE) 160-9-4.8 MCG/ACT AERO Inhale 2 puffs into the lungs in the morning and at bedtime.     carvedilol (COREG) 12.5 MG tablet Take 1 tablet (12.5 mg total) by mouth 2 (two) times daily. 180 tablet 3   fluticasone (FLONASE) 50 MCG/ACT nasal spray Place 2 sprays into both nostrils daily. 16 g 6   folic acid (FOLVITE) 1 MG tablet Take 1 tablet (1 mg total) by mouth daily. 90 tablet 0   hydrochlorothiazide (HYDRODIURIL) 25 MG tablet Take 1 tablet (25 mg total) by mouth daily. 90 tablet 1   losartan (COZAAR) 50 MG tablet TAKE 1 TABLET BY MOUTH DAILY 100 tablet 1   Multiple Vitamin (MULTIVITAMIN WITH MINERALS) TABS tablet Take 1 tablet by mouth daily. 90 tablet 0   VENTOLIN HFA 108 (90 Base) MCG/ACT inhaler USE 2 INHALATIONS BY MOUTH EVERY 6 HOURS AS NEEDED FOR WHEEZING  OR SHORTNESS OF BREATH 54 g 3   No current facility-administered medications for this visit.    No Known Allergies   REVIEW OF SYSTEMS:   [X]  denotes positive finding, [ ]  denotes negative finding Cardiac  Comments:  Chest pain or chest pressure:    Shortness of breath upon exertion:    Short of breath when lying flat:    Irregular heart rhythm:         Vascular    Pain in calf, thigh, or hip brought on by ambulation:    Pain in feet at night that wakes you up from your sleep:     Blood clot in your veins:    Leg swelling:  X ankles      Pulmonary    Oxygen at home:    Productive cough:     Wheezing:         Neurologic    Sudden weakness in arms or legs:     Sudden numbness in arms or legs:     Sudden onset of difficulty speaking or  slurred speech:    Temporary loss of vision in one eye:     Problems with dizziness:         Gastrointestinal    Blood in stool:     Vomited blood:         Genitourinary    Burning when urinating:     Blood in urine:        Psychiatric    Major depression:         Hematologic    Bleeding problems:    Problems with blood clotting too easily:        Skin    Rashes or ulcers:        Constitutional    Fever or chills:      PHYSICAL EXAMINATION:  There were no vitals filed for this visit.  General:  WDWN in NAD; vital signs documented above Gait: Not observed HENT: WNL, normocephalic Pulmonary: normal non-labored breathing , without wheezing Cardiac: regular HR Abdomen: soft, NT, no masses Skin: without rashes Vascular Exam/Pulses:  Right Left  Radial 2+ (normal) 2+ (normal)  Ulnar    Femoral 2+ (normal) 2+ (normal)  Popliteal    DP 2+ (normal) 2+ (normal)  PT     Extremities: without ischemic changes, without Gangrene , without cellulitis; without open wounds;  Musculoskeletal: no muscle wasting or atrophy  Neurologic: A&O X 3;  No focal weakness or paresthesias are detected Psychiatric:  The pt has Normal affect.   Non-Invasive Vascular Imaging:     Abdominal Aorta Findings:  +-------------+-------+----------+----------+---------+--------------------  +  Location    AP (cm)Trans (cm)PSV (cm/s)Waveform Comments               +-------------+-------+----------+----------+---------+--------------------  +  Proximal    2.90   3.20      66        triphasic                        +-------------+-------+----------+----------+---------+--------------------  +  Mid         3.60   3.80      33        triphasicPrior: 3.6 x 4.1 cm    +-------------+-------+----------+----------+---------+--------------------  +  Distal      2.90   3.10      38        triphasic                       +-------------+-------+----------+----------+---------+--------------------  +  RT CIA Prox  3.2    3.1       29        triphasicPrior: 3.4 x 3.2 cm    +-------------+-------+----------+----------+---------+--------------------  +  RT CIA Distal2.0    1.9       20        triphasic                       +-------------+-------+----------+----------+---------+--------------------  +  RT EIA Prox  1.2    1.1       96        triphasic                       +-------------+-------+----------+----------+---------+--------------------  +  RT EIA Distal1.1    1.2       111       triphasic                       +-------------+-------+----------+----------+---------+--------------------  +  LT CIA Prox  1.5    1.7       112       triphasicPrior: 1.5 x 1.6 cm    +-------------+-------+----------+----------+---------+--------------------  +  LT CIA Distal1.7    1.7       113       triphasicPrior: 1.9 x 1.8 cm    +-------------+-------+----------+----------+---------+--------------------  +  LT EIA Prox  1.1    1.1       101                                       +-------------+-------+----------+----------+---------+--------------------  +  LT EIA Distal1.2    1.3       114       triphasic                       +-------------+-------+----------+----------+---------+--------------------  +     Summary: - There is evidence of abnormal dilatation of the mid abdominal  aorta with no significant change compared to prior.  - There is evidence of abnormal dilation of the right common iliac artery  and  left common iliac artery with no significant change compared to prior.     ASSESSMENT/PLAN: ARCHIMEDES HAROLD is a 73 y.o. male presenting with aortoiliac aneurysmal disease.  Ultrasound duplex today demonstrated a small growth in the infrarenal abdominal aortic aneurysm to 3.8 cm. The right common iliac artery aneurysm is unchanged in size.  Carotids were insonated demonstrating no significant stenosis. Bilateral lower extremity arterial duplex demonstrated some ectasia of the right popliteal artery.  No aneurysm bilaterally.  No thrombus within the ectasia.  ABIs unchanged.  I had a long discussion with Joshua Schmidt regarding the natural history of aortoiliac aneurysmal disease, as well as the treatment modalities associated.  We discussed that at his current sizes, we can continue to watch the aneurysms as the risk of rupture is less than the risk of surgery.  Should the iliac artery aneurysms reaches size greater than 3.5 cm, or the infrarenal abdominal aneurysm reaches size greater than 5.5 cm we would discuss elective repair.  Joshua Schmidt has a previous history of abdominal surgery through a midline laparotomy. From his most recent CT scan, he appears to be a candidate for endovascular approach.    He is aware that now that he is oxygen dependent, his risk profile has increased.  Prior to pursuing surgery, I would ask for cardiology clearance.   My plan is to see, Joshua Schmidt every 6 months with aortogram iliac duplex ultrasound.    We discussed the signs and symptoms of rupture, and I asked him to call 911 immediately should any of these occur.  I asked that he continue his aspirin and statin therapy.    Victorino Sparrow, MD [facility] [phone]

## 2023-07-15 ENCOUNTER — Other Ambulatory Visit: Payer: Self-pay | Admitting: Vascular Surgery

## 2023-07-15 ENCOUNTER — Ambulatory Visit (INDEPENDENT_AMBULATORY_CARE_PROVIDER_SITE_OTHER)
Admission: RE | Admit: 2023-07-15 | Discharge: 2023-07-15 | Disposition: A | Discharge status: Home / Self Care | Payer: 59 | Source: Ambulatory Visit | Attending: Vascular Surgery | Admitting: Vascular Surgery

## 2023-07-15 ENCOUNTER — Ambulatory Visit (INDEPENDENT_AMBULATORY_CARE_PROVIDER_SITE_OTHER)
Admission: RE | Admit: 2023-07-15 | Discharge: 2023-07-15 | Discharge status: Home / Self Care | Payer: 59 | Source: Ambulatory Visit | Attending: Vascular Surgery

## 2023-07-15 ENCOUNTER — Encounter: Payer: Self-pay | Admitting: Vascular Surgery

## 2023-07-15 ENCOUNTER — Ambulatory Visit (HOSPITAL_COMMUNITY)
Admission: RE | Admit: 2023-07-15 | Discharge: 2023-07-15 | Disposition: A | Discharge status: Home / Self Care | Payer: 59 | Source: Ambulatory Visit | Attending: Vascular Surgery | Admitting: Vascular Surgery

## 2023-07-15 ENCOUNTER — Ambulatory Visit (INDEPENDENT_AMBULATORY_CARE_PROVIDER_SITE_OTHER): Payer: 59 | Admitting: Vascular Surgery

## 2023-07-15 VITALS — BP 150/97 | HR 73 | Temp 98.4°F | Ht 75.0 in | Wt 223.0 lb

## 2023-07-15 DIAGNOSIS — I7143 Infrarenal abdominal aortic aneurysm, without rupture: Secondary | ICD-10-CM

## 2023-07-15 DIAGNOSIS — I723 Aneurysm of iliac artery: Secondary | ICD-10-CM | POA: Insufficient documentation

## 2023-07-15 DIAGNOSIS — I739 Peripheral vascular disease, unspecified: Secondary | ICD-10-CM | POA: Diagnosis not present

## 2023-07-15 DIAGNOSIS — R0989 Other specified symptoms and signs involving the circulatory and respiratory systems: Secondary | ICD-10-CM | POA: Diagnosis not present

## 2023-07-15 LAB — VAS US ABI WITH/WO TBI
Left ABI: 1.34
Right ABI: 1.25

## 2023-07-16 ENCOUNTER — Other Ambulatory Visit: Payer: Self-pay | Admitting: *Deleted

## 2023-07-16 DIAGNOSIS — I7143 Infrarenal abdominal aortic aneurysm, without rupture: Secondary | ICD-10-CM

## 2023-07-23 ENCOUNTER — Telehealth: Payer: Self-pay

## 2023-07-23 NOTE — Telephone Encounter (Signed)
 [facility] refill on  budeson-glycopyrrolate-formoterol (BREZTRI AEROSPHERE) 160-9-4.8 MCG/ACT AERO

## 2023-07-24 DIAGNOSIS — J9601 Acute respiratory failure with hypoxia: Secondary | ICD-10-CM | POA: Diagnosis not present

## 2023-07-24 DIAGNOSIS — I5033 Acute on chronic diastolic (congestive) heart failure: Secondary | ICD-10-CM | POA: Diagnosis not present

## 2023-07-24 DIAGNOSIS — J441 Chronic obstructive pulmonary disease with (acute) exacerbation: Secondary | ICD-10-CM | POA: Diagnosis not present

## 2023-07-26 ENCOUNTER — Ambulatory Visit (INDEPENDENT_AMBULATORY_CARE_PROVIDER_SITE_OTHER): Admitting: Internal Medicine

## 2023-07-26 ENCOUNTER — Encounter: Payer: Self-pay | Admitting: Internal Medicine

## 2023-07-26 VITALS — BP 136/82 | HR 74 | Resp 16 | Ht 75.0 in | Wt 224.4 lb

## 2023-07-26 DIAGNOSIS — E782 Mixed hyperlipidemia: Secondary | ICD-10-CM

## 2023-07-26 DIAGNOSIS — J449 Chronic obstructive pulmonary disease, unspecified: Secondary | ICD-10-CM

## 2023-07-26 DIAGNOSIS — I10 Essential (primary) hypertension: Secondary | ICD-10-CM | POA: Diagnosis not present

## 2023-07-26 MED ORDER — CARVEDILOL 12.5 MG PO TABS: 12.5000 mg | ORAL_TABLET | Freq: Two times a day (BID) | ORAL | 3 refills | Status: DC

## 2023-07-26 MED ORDER — BREZTRI AEROSPHERE 160-9-4.8 MCG/ACT IN AERO: 2.0000 | INHALATION_SPRAY | Freq: Two times a day (BID) | RESPIRATORY_TRACT | 11 refills | Status: DC

## 2023-07-26 MED ORDER — ATORVASTATIN CALCIUM 20 MG PO TABS: 20.0000 mg | ORAL_TABLET | Freq: Every day | ORAL | 2 refills | Status: DC

## 2023-07-26 NOTE — Assessment & Plan Note (Signed)
 Stable, plan to recheck fasting labs at follow up, continue and refill statin.

## 2023-07-26 NOTE — Telephone Encounter (Signed)
 Patient seen for appointment, medication changed

## 2023-07-26 NOTE — Addendum Note (Signed)
 Addended by: Rockney Cid on: 07/26/2023 09:16 AM   Modules accepted: Level of Service

## 2023-07-26 NOTE — Progress Notes (Signed)
 Established Patient Office Visit  Subjective    Patient ID: Joshua Schmidt, male    DOB: 10-27-1950  Age: 73 y.o. MRN: [id number]  CC:  Chief Complaint  Patient presents with   Medical Management of Chronic Issues    HPI Joshua Schmidt presents to follow up. Patient has been doing well since our last visit.   Hypertension: -Medications: Coreg 12.5 mg BID, HCTZ 25 mg, Losartan 50 mg -Patient is compliant with above medications and reports no side effects.   -Failed Meds: Lisinopril due to facial swelling -Checking BP at home (average): 132-140/80 -Denies any SOB, CP, vision changes or symptoms of hypotension - does have BLE edema but fairly well controlled  -Following with Cardiology  HLD/History of MI/Aortic and Iliac Aneurysms/Chronic Venous Insufficiency:   -Medications: Lipitor 20 mg, aspirin 81 mg -Patient is compliant with above medications and reports no side effects.  -History of MI in 2022 for which he did not seek medical attention at the time, was diagnosed retroactively  -CTA 7/24 with dilated ascending thoracic aorta 4.5 cm, iliac duplex US  8/24 with 4 cm AAA, 3.4 cm right common iliac artery aneurysm without significant growth. - Per Cardiology note, patient no longer a candidate for surgical fixation now that he is oxygen dependent so routine imaging is no longer required.  Of note, ascending thoracic aortic aneurysm minimally progressive to 4.7 cm on lung CT 11/24 -Last lipid panel: Lipid Panel     Component Value Date/Time   CHOL 108 09/29/2022 0507   TRIG 55 09/29/2022 0507   HDL 56 09/29/2022 0507   CHOLHDL 1.9 09/29/2022 0507   VLDL 11 09/29/2022 0507   LDLCALC 41 09/29/2022 0507   LDLCALC 50 10/21/2021 1145   Erythrocytosis:  -Following with Hematolog -Last labs from November showing improvement in hemoglobin to 16.8  COPD: -COPD status: stable -Current medications: Now on Breztri and Albuterol but about to run out of Breztri, mail pharmacy  supposed to send more soon -Oxygen use: yes -requiring 2 L supplemental oxygen but has been able to maintain oxygen saturations of 90% or above at rest or with minimal activity -Dyspnea frequency: Occasional  -Cough frequency: Daily, productive  -Limitation of activity: no -Pneumovax: Up to Date -Influenza: Up to Date -Annual lung cancer screening: 11/24 Lung-RADS-2  -Stopped smoking since hospitalization -Now following with Pulmonology, last seen 06/24/23  PTSD: -Not currently on medication -Had done counseling in the past but not interested it anything now  Health Maintance: -Blood work UTD -Colon cancer screening: colonoscopy 12/23, 3 polyps removed, plan to follow up in 1 year. Referral placed previously -Just had flu and RSV vaccines in August  Outpatient Encounter Medications as of 07/26/2023  Medication Sig   aspirin 81 MG chewable tablet Chew 81 mg by mouth daily.   atorvastatin (LIPITOR) 20 MG tablet Take 1 tablet (20 mg total) by mouth daily.   Budeson-Glycopyrrol-Formoterol (BREZTRI AEROSPHERE) 160-9-4.8 MCG/ACT AERO Inhale 2 puffs into the lungs in the morning and at bedtime.   budeson-glycopyrrolate-formoterol (BREZTRI AEROSPHERE) 160-9-4.8 MCG/ACT AERO Inhale 2 puffs into the lungs in the morning and at bedtime.   carvedilol (COREG) 12.5 MG tablet Take 1 tablet (12.5 mg total) by mouth 2 (two) times daily.   fluticasone (FLONASE) 50 MCG/ACT nasal spray Place 2 sprays into both nostrils daily.   folic acid (FOLVITE) 1 MG tablet Take 1 tablet (1 mg total) by mouth daily.   hydrochlorothiazide (HYDRODIURIL) 25 MG tablet Take 1 tablet (25 mg total)  by mouth daily.   losartan (COZAAR) 50 MG tablet TAKE 1 TABLET BY MOUTH DAILY   Multiple Vitamin (MULTIVITAMIN WITH MINERALS) TABS tablet Take 1 tablet by mouth daily.   VENTOLIN HFA 108 (90 Base) MCG/ACT inhaler USE 2 INHALATIONS BY MOUTH EVERY 6 HOURS AS NEEDED FOR WHEEZING  OR SHORTNESS OF BREATH   No facility-administered  encounter medications on file as of 07/26/2023.    Past Medical History:  Diagnosis Date   Aneurysm (HCC)    COPD (chronic obstructive pulmonary disease) (HCC)    Erythrocytosis 12/01/2018   Hyperlipidemia    Hypertension    Myocardial infarction [facility])     Past Surgical History:  Procedure Laterality Date   APPENDECTOMY     COLONOSCOPY WITH PROPOFOL N/A 11/20/2014   Procedure: COLONOSCOPY WITH PROPOFOL;  Surgeon: Kieth Brightly, MD;  Location: [facility] ENDOSCOPY;  Service: Endoscopy;  Laterality: N/A;   COLONOSCOPY WITH PROPOFOL N/A 03/19/2022   Procedure: COLONOSCOPY WITH PROPOFOL;  Surgeon: Toney Reil, MD;  Location: [facility] ENDOSCOPY;  Service: Gastroenterology;  Laterality: N/A;   TONSILLECTOMY AND ADENOIDECTOMY      History reviewed. No pertinent family history.  Social History   Socioeconomic History   Marital status: Married    Spouse name: Not on file   Number of children: Not on file   Years of education: Not on file   Highest education level: Not on file  Occupational History   Not on file  Tobacco Use   Smoking status: Former    Current packs/day: 0.00    Average packs/day: 1 pack/day for 51.0 years (51.0 ttl pk-yrs)    Types: Cigarettes    Start date: 10/01/1971    Quit date: 10/01/2022    Years since quitting: 0.8   Smokeless tobacco: Never  Vaping Use   Vaping status: Never Used  Substance and Sexual Activity   Alcohol use: Yes   Drug use: No   Sexual activity: Not Currently  Other Topics Concern   Not on file  Social History Narrative   Not on file   Social Drivers of Health   Financial Resource Strain: Low Risk  (10/16/2022)   Overall Financial Resource Strain (CARDIA)    Difficulty of Paying Living Expenses: Not hard at all  Food Insecurity: No Food Insecurity (10/16/2022)   Hunger Vital Sign    Worried About Running Out of Food in the Last Year: Never true    Ran Out of Food in the Last Year: Never true  Transportation Needs: No  Transportation Needs (10/16/2022)   PRAPARE - Administrator, Civil Service (Medical): No    Lack of Transportation (Non-Medical): No  Physical Activity: Sufficiently Active (10/16/2022)   Exercise Vital Sign    Days of Exercise per Week: 3 days    Minutes of Exercise per Session: 90 min  Stress: No Stress Concern Present (10/16/2022)   [employer] of Occupational Health - Occupational Stress Questionnaire    Feeling of Stress : Only a little  Social Connections: Moderately Integrated (10/16/2022)   Social Connection and Isolation Panel [NHANES]    Frequency of Communication with Friends and Family: Three times a week    Frequency of Social Gatherings with Friends and Family: More than three times a week    Attends Religious Services: More than 4 times per year    Active Member of [employer] or Organizations: No    Attends Banker Meetings: Never    Marital Status: Married  Intimate Partner Violence: Not At Risk (10/16/2022)   Humiliation, Afraid, Rape, and Kick questionnaire    Fear of Current or Ex-Partner: No    Emotionally Abused: No    Physically Abused: No    Sexually Abused: No    Review of Systems  Constitutional:  Negative for chills and fever.  Eyes:  Negative for blurred vision.  Respiratory:  Negative for cough, shortness of breath and wheezing.   Cardiovascular:  Negative for chest pain, palpitations and leg swelling.  Gastrointestinal:  Negative for abdominal pain.  Neurological:  Negative for dizziness and headaches.        Objective    BP 136/82   Pulse 74   Resp 16   Ht 6\' 3"  (1.905 m)   Wt 224 lb 6.4 oz (101.8 kg)   SpO2 90% Comment: 2L  BMI 28.05 kg/m   Physical Exam Constitutional:      Appearance: Normal appearance.  HENT:     Head: Normocephalic and atraumatic.     Mouth/Throat:     Mouth: Mucous membranes are moist.     Pharynx: Oropharynx is clear.  Eyes:     Extraocular Movements: Extraocular movements intact.      Conjunctiva/sclera: Conjunctivae normal.     Pupils: Pupils are equal, round, and reactive to light.  Cardiovascular:     Rate and Rhythm: Normal rate and regular rhythm.  Pulmonary:     Effort: Pulmonary effort is normal.     Breath sounds: Normal breath sounds.     Comments: On 2 L [place] Musculoskeletal:     Right lower leg: No edema.     Left lower leg: No edema.  Skin:    General: Skin is warm and dry.  Neurological:     General: No focal deficit present.     Mental Status: He is alert. Mental status is at baseline.  Psychiatric:        Mood and Affect: Mood normal.        Behavior: Behavior normal.     Last CBC Lab Results  Component Value Date   WBC 7.2 02/22/2023   HGB 16.8 02/22/2023   HCT 50.3 02/22/2023   MCV 97.3 02/22/2023   MCH 32.5 02/22/2023   RDW 12.5 02/22/2023   PLT 191 02/22/2023   Last metabolic panel Lab Results  Component Value Date   GLUCOSE 90 11/20/2022   NA 140 11/20/2022   K 4.4 11/20/2022   CL 101 11/20/2022   CO2 33 (H) 11/20/2022   BUN 11 11/20/2022   CREATININE 0.58 (L) 11/20/2022   GFRNONAA >60 10/01/2022   CALCIUM 9.3 11/20/2022   PROT 6.3 11/20/2022   ALBUMIN 3.6 01/22/2022   BILITOT 0.3 11/20/2022   ALKPHOS 58 01/22/2022   AST 16 11/20/2022   ALT 17 11/20/2022   ANIONGAP 11 10/01/2022   Last lipids Lab Results  Component Value Date   CHOL 108 09/29/2022   HDL 56 09/29/2022   LDLCALC 41 09/29/2022   TRIG 55 09/29/2022   CHOLHDL 1.9 09/29/2022   Last hemoglobin A1c Lab Results  Component Value Date   HGBA1C 5.4 09/28/2022   Last thyroid functions Lab Results  Component Value Date   TSH 0.69 10/21/2021   Last vitamin D No results found for: "25OHVITD2", "25OHVITD3", "VD25OH" Last vitamin B12 and Folate Lab Results  Component Value Date   VITAMINB12 839 11/20/2022   FOLATE >24.0 11/20/2022        Assessment & Plan:   Chronic  obstructive pulmonary disease, unspecified COPD type (HCC) Assessment &  Plan: On supplemental oxygen, doing well, maintaining pulse ox 90-95%. About to run out of Breztri, will refill and send to [facility], sample of Trelegy given today to tide him over. Following with Pulmonology.   Orders: -     Breztri Aerosphere; Inhale 2 puffs into the lungs in the morning and at bedtime.  Dispense: 10.7 g; Refill: 11  Hypertension, unspecified type Assessment & Plan: Blood pressure stable here today, no changes made to medications and appropriate refills sent to pharmacy.    Orders: -     Carvedilol; Take 1 tablet (12.5 mg total) by mouth 2 (two) times daily.  Dispense: 180 tablet; Refill: 3  Mixed hyperlipidemia Assessment & Plan: Stable, plan to recheck fasting labs at follow up, continue and refill statin.   Orders: -     Atorvastatin Calcium; Take 1 tablet (20 mg total) by mouth daily.  Dispense: 100 tablet; Refill: 2    Return in about 3 months (around 10/25/2023).   Rockney Cid, DO

## 2023-07-26 NOTE — Assessment & Plan Note (Signed)
 On supplemental oxygen, doing well, maintaining pulse ox 90-95%. About to run out of Breztri, will refill and send to [facility], sample of Trelegy given today to tide him over. Following with Pulmonology.

## 2023-07-26 NOTE — Assessment & Plan Note (Signed)
 Blood pressure stable here today, no changes made to medications and appropriate refills sent to pharmacy.

## 2023-08-04 DIAGNOSIS — J45909 Unspecified asthma, uncomplicated: Secondary | ICD-10-CM | POA: Diagnosis not present

## 2023-08-23 ENCOUNTER — Inpatient Hospital Stay: Payer: 59 | Attending: Oncology

## 2023-08-23 ENCOUNTER — Inpatient Hospital Stay: Payer: 59 | Admitting: Oncology

## 2023-08-23 DIAGNOSIS — J441 Chronic obstructive pulmonary disease with (acute) exacerbation: Secondary | ICD-10-CM | POA: Diagnosis not present

## 2023-08-23 DIAGNOSIS — J9601 Acute respiratory failure with hypoxia: Secondary | ICD-10-CM | POA: Diagnosis not present

## 2023-08-23 DIAGNOSIS — I5033 Acute on chronic diastolic (congestive) heart failure: Secondary | ICD-10-CM | POA: Diagnosis not present

## 2023-09-03 DIAGNOSIS — J45909 Unspecified asthma, uncomplicated: Secondary | ICD-10-CM | POA: Diagnosis not present

## 2023-09-09 ENCOUNTER — Other Ambulatory Visit: Payer: Self-pay | Admitting: Internal Medicine

## 2023-09-09 DIAGNOSIS — I10 Essential (primary) hypertension: Secondary | ICD-10-CM

## 2023-09-11 NOTE — Telephone Encounter (Signed)
 Requested medication (s) are due for refill today: yes  Requested medication (s) are on the active medication list: yes  Last refill:  03/15/23 #100/1  Future visit scheduled: yes  Notes to clinic:  Unable to refill per protocol due to failed labs, no updated results.      Requested Prescriptions  Pending Prescriptions Disp Refills   losartan  (COZAAR ) 50 MG tablet [Pharmacy Med Name: Losartan  Potassium 50 MG Oral Tablet] 100 tablet 2    Sig: TAKE 1 TABLET BY MOUTH DAILY     Cardiovascular:  Angiotensin Receptor Blockers Failed - 09/11/2023  2:34 PM      Failed - Cr in normal range and within 180 days    Creat  Date Value Ref Range Status  11/20/2022 0.58 (L) 0.70 - 1.28 mg/dL Final         Failed - K in normal range and within 180 days    Potassium  Date Value Ref Range Status  11/20/2022 4.4 3.5 - 5.3 mmol/L Final         Failed - Valid encounter within last 6 months    Recent Outpatient Visits           1 month ago Chronic obstructive pulmonary disease, unspecified COPD type [facility])   [facility] Rockney Cid, [place] - Patient is not pregnant      Passed - Last BP in normal range    BP Readings from Last 1 Encounters:  07/26/23 136/82

## 2023-09-23 DIAGNOSIS — I5033 Acute on chronic diastolic (congestive) heart failure: Secondary | ICD-10-CM | POA: Diagnosis not present

## 2023-09-23 DIAGNOSIS — J441 Chronic obstructive pulmonary disease with (acute) exacerbation: Secondary | ICD-10-CM | POA: Diagnosis not present

## 2023-09-23 DIAGNOSIS — J9601 Acute respiratory failure with hypoxia: Secondary | ICD-10-CM | POA: Diagnosis not present

## 2023-09-27 ENCOUNTER — Telehealth: Payer: Self-pay

## 2023-09-27 ENCOUNTER — Other Ambulatory Visit: Payer: Self-pay | Admitting: Emergency Medicine

## 2023-09-27 DIAGNOSIS — I10 Essential (primary) hypertension: Secondary | ICD-10-CM

## 2023-09-27 MED ORDER — HYDROCHLOROTHIAZIDE 25 MG PO TABS: 25.0000 mg | ORAL_TABLET | Freq: Every day | ORAL | 1 refills | Status: DC

## 2023-09-27 NOTE — Telephone Encounter (Signed)
 Refill request on   hydrochlorothiazide  (HYDRODIURIL ) 25 MG tablet

## 2023-09-27 NOTE — Telephone Encounter (Signed)
 Script send for refill

## 2023-10-04 DIAGNOSIS — J45909 Unspecified asthma, uncomplicated: Secondary | ICD-10-CM | POA: Diagnosis not present

## 2023-10-21 ENCOUNTER — Ambulatory Visit (INDEPENDENT_AMBULATORY_CARE_PROVIDER_SITE_OTHER): Payer: Self-pay

## 2023-10-21 VITALS — BP 140/82 | Ht 75.0 in | Wt 226.7 lb

## 2023-10-21 DIAGNOSIS — Z0000 Encounter for general adult medical examination without abnormal findings: Secondary | ICD-10-CM

## 2023-10-21 DIAGNOSIS — Z1211 Encounter for screening for malignant neoplasm of colon: Secondary | ICD-10-CM

## 2023-10-21 NOTE — Progress Notes (Signed)
 Subjective:   Joshua Schmidt is a 73 y.o. who presents for a Medicare Wellness preventive visit.  As a reminder, Annual Wellness Visits don't include a physical exam, and some assessments may be limited, especially if this visit is performed virtually. We may recommend an in-person follow-up visit with your provider if needed.  Visit Complete: In person  Persons Participating in Visit: Patient.  AWV Questionnaire: No: Patient Medicare AWV questionnaire was not completed prior to this visit.  Cardiac Risk Factors include: advanced age (>58men, >14 women);dyslipidemia;male gender;hypertension;smoking/ tobacco exposure     Objective:    Today's Vitals   10/21/23 1350  BP: (!) 140/82  Weight: 226 lb 11.2 oz (102.8 kg)  Height: 6' 3 (1.905 m)   Body mass index is 28.34 kg/m.     10/21/2023    2:06 PM 02/22/2023    2:23 PM 10/16/2022    2:57 PM 09/28/2022    6:51 PM 09/28/2022    3:01 PM 03/19/2022   11:19 AM 01/22/2022    2:38 PM  Advanced Directives  Does Patient Have a Medical Advance Directive? No Yes Yes Yes Yes Yes Yes  Type of Furniture conservator/restorer;Living will Healthcare Power of [place];Living will Healthcare Power of [place];Living will Living will;Out of facility DNR (pink MOST or yellow form) Healthcare Power of [place];Living will Living will;Healthcare Power of Attorney  Does patient want to make changes to medical advance directive?    No - Patient declined     Copy of Healthcare Power of Attorney in Chart?  No - copy requested  No - copy requested   No - copy requested  Would patient like information on creating a medical advance directive? No - Patient declined          Current Medications (verified) Outpatient Encounter Medications as of 10/21/2023  Medication Sig   aspirin  81 MG chewable tablet Chew 81 mg by mouth daily.   atorvastatin  (LIPITOR) 20 MG tablet Take 1 tablet (20 mg total) by mouth daily.    budeson-glycopyrrolate-formoterol (BREZTRI  AEROSPHERE) 160-9-4.8 MCG/ACT AERO inhaler Inhale 2 puffs into the lungs in the morning and at bedtime.   carvedilol  (COREG ) 12.5 MG tablet Take 1 tablet (12.5 mg total) by mouth 2 (two) times daily.   fluticasone  (FLONASE ) 50 MCG/ACT nasal spray Place 2 sprays into both nostrils daily.   folic acid  (FOLVITE ) 1 MG tablet Take 1 tablet (1 mg total) by mouth daily.   hydrochlorothiazide  (HYDRODIURIL ) 25 MG tablet Take 1 tablet (25 mg total) by mouth daily.   losartan  (COZAAR ) 50 MG tablet TAKE 1 TABLET BY MOUTH DAILY   Multiple Vitamin (MULTIVITAMIN WITH MINERALS) TABS tablet Take 1 tablet by mouth daily.   VENTOLIN  HFA 108 (90 Base) MCG/ACT inhaler USE 2 INHALATIONS BY MOUTH EVERY 6 HOURS AS NEEDED FOR WHEEZING  OR SHORTNESS OF BREATH   No facility-administered encounter medications on file as of 10/21/2023.    Allergies (verified) Patient has no known allergies.   History: Past Medical History:  Diagnosis Date   Aneurysm (HCC)    COPD (chronic obstructive pulmonary disease) (HCC)    Erythrocytosis 12/01/2018   Hyperlipidemia    Hypertension    Myocardial infarction [facility])    Past Surgical History:  Procedure Laterality Date   APPENDECTOMY     COLONOSCOPY WITH PROPOFOL  N/A 11/20/2014   Procedure: COLONOSCOPY WITH PROPOFOL ;  Surgeon: Louanne KANDICE Muse, MD;  Location: [facility] ENDOSCOPY;  Service: Endoscopy;  Laterality: N/A;  COLONOSCOPY WITH PROPOFOL  N/A 03/19/2022   Procedure: COLONOSCOPY WITH PROPOFOL ;  Surgeon: Unk Corinn Skiff, MD;  Location[facility] ENDOSCOPY;  Service: Gastroenterology;  Laterality: N/A;   TONSILLECTOMY AND ADENOIDECTOMY     No family history on file. Social History   Socioeconomic History   Marital status: Married    Spouse name: Not on file   Number of children: Not on file   Years of education: Not on file   Highest education level: Not on file  Occupational History   Not on file  Tobacco Use   Smoking  status: Former    Current packs/day: 0.00    Average packs/day: 1 pack/day for 51.0 years (51.0 ttl pk-yrs)    Types: Cigarettes    Start date: 10/01/1971    Quit date: 10/01/2022    Years since quitting: 1.0   Smokeless tobacco: Never  Vaping Use   Vaping status: Never Used  Substance and Sexual Activity   Alcohol use: Yes   Drug use: No   Sexual activity: Not Currently  Other Topics Concern   Not on file  Social History Narrative   Not on file   Social Drivers of Health   Financial Resource Strain: Low Risk  (10/21/2023)   Overall Financial Resource Strain (CARDIA)    Difficulty of Paying Living Expenses: Not hard at all  Food Insecurity: No Food Insecurity (10/21/2023)   Hunger Vital Sign    Worried About Running Out of Food in the Last Year: Never true    Ran Out of Food in the Last Year: Never true  Transportation Needs: No Transportation Needs (10/21/2023)   PRAPARE - Administrator, Civil Service (Medical): No    Lack of Transportation (Non-Medical): No  Physical Activity: Sufficiently Active (10/21/2023)   Exercise Vital Sign    Days of Exercise per Week: 3 days    Minutes of Exercise per Session: 60 min  Stress: No Stress Concern Present (10/21/2023)   [employer] of Occupational Health - Occupational Stress Questionnaire    Feeling of Stress: Only a little  Social Connections: Moderately Integrated (10/21/2023)   Social Connection and Isolation Panel    Frequency of Communication with Friends and Family: More than three times a week    Frequency of Social Gatherings with Friends and Family: Twice a week    Attends Religious Services: More than 4 times per year    Active Member of [employer] or Organizations: No    Attends Engineer, structural: Never    Marital Status: Married    Tobacco Counseling Counseling given: Not Answered    Clinical Intake:  Pre-visit preparation completed: Yes  Pain : No/denies pain     BMI - recorded:  28.34 Nutritional Status: BMI 25 -29 Overweight Nutritional Risks: None Diabetes: No  Lab Results  Component Value Date   HGBA1C 5.4 09/28/2022     How often do you need to have someone help you when you read instructions, pamphlets, or other written materials from your doctor or pharmacy?: 1 - Never  Interpreter Needed?: No  Information entered by :: JHONNIE DAS, LPN   Activities of Daily Living    10/21/2023    2:07 PM 03/19/2023   11:32 AM  In your present state of health, do you have any difficulty performing the following activities:  Hearing? 0 0  Vision? 0 0  Difficulty concentrating or making decisions? 0 0  Walking or climbing stairs? 0 0  Dressing or bathing? 0  0  Doing errands, shopping? 0 0  Preparing Food and eating ? N   Using the Toilet? N   In the past six months, have you accidently leaked urine? N   Do you have problems with loss of bowel control? N   Managing your Medications? N   Managing your Finances? N   Housekeeping or managing your Housekeeping? N     Patient Care Team: Bernardo Fend, DO as PCP - General (Internal Medicine) Darron Deatrice LABOR, MD as PCP - Cardiology (Cardiology) Babara Call, MD as Consulting Physician (Oncology)  I have updated your Care Teams any recent Medical Services you may have received from other providers in the past year.     Assessment:   This is a routine wellness examination for [facility].  Hearing/Vision screen Hearing Screening - Comments:: NO AIDS Vision Screening - Comments:: READERS- LENSCRAFTERS   Goals Addressed             This Visit's Progress    DIET - EAT MORE FRUITS AND VEGETABLES         Depression Screen     10/21/2023    2:00 PM 07/26/2023    8:07 AM 03/19/2023   11:32 AM 12/18/2022   11:31 AM 11/20/2022    7:59 AM 10/16/2022    2:55 PM 09/28/2022   11:01 AM  PHQ 2/9 Scores  PHQ - 2 Score 0 0 0 0 0 0 0  PHQ- 9 Score 0 0  0 0  0    Fall Risk     10/21/2023    2:07 PM 07/26/2023     8:03 AM 03/19/2023   11:32 AM 12/18/2022   11:31 AM 11/20/2022    7:59 AM  Fall Risk   Falls in the past year? 0 0 0 0 1  Number falls in past yr: 0 0 0 0 0  Injury with Fall? 0 0 0 0 1  Risk for fall due to : No Fall Risks No Fall Risks   Impaired balance/gait  Follow up Falls evaluation completed Falls prevention discussed;Education provided;Falls evaluation completed   Falls prevention discussed;Education provided;Falls evaluation completed    MEDICARE RISK AT HOME:  Medicare Risk at Home Any stairs in or around the home?: Yes If so, are there any without handrails?: No Home free of loose throw rugs in walkways, pet beds, electrical cords, etc?: Yes Adequate lighting in your home to reduce risk of falls?: Yes Life alert?: No Use of a cane, walker or w/c?: No Grab bars in the bathroom?: Yes Shower chair or bench in shower?: No Elevated toilet seat or a handicapped toilet?: Yes  TIMED UP AND GO:  Was the test performed?  Yes  Length of time to ambulate 10 feet: 5 sec Gait slow and steady without use of assistive device  Cognitive Function: 6CIT completed        10/21/2023    2:09 PM 10/16/2022    2:58 PM  6CIT Screen  What Year? 0 points 0 points  What month? 0 points 0 points  What time? 0 points 0 points  Count back from 20 0 points 0 points  Months in reverse 2 points 0 points  Repeat phrase 0 points 0 points  Total Score 2 points 0 points    Immunizations Immunization History  Administered Date(s) Administered   Fluad Quad(high Dose 65+) 01/13/2022   Influenza, High Dose Seasonal PF 01/12/2018   Influenza-Unspecified 01/06/2021, 12/01/2022   PFIZER(Purple Top)SARS-COV-2 Vaccination 06/03/2019, 06/27/2019,  01/11/2020, 07/26/2020   PNEUMOCOCCAL CONJUGATE-20 02/10/2021, 12/01/2022   Pfizer Covid-19 Vaccine Bivalent Booster 5y-11y 01/06/2021   Pfizer(Comirnaty)Fall Seasonal Vaccine 12 years and older 12/29/2022   Respiratory Syncytial Virus Vaccine,Recomb  Aduvanted(Arexvy) 12/01/2022, 12/29/2022   Tdap 02/10/2021, 02/08/2023   Zoster Recombinant(Shingrix) 05/10/2014    Screening Tests Health Maintenance  Topic Date Due   Hepatitis C Screening  Never done   COVID-19 Vaccine (7 - 2024-25 season) 06/28/2023   Zoster Vaccines- Shingrix (2 of 2) 10/25/2023 (Originally 07/05/2014)   Colonoscopy  07/25/2024 (Originally 03/20/2023)   INFLUENZA VACCINE  11/12/2023   Lung Cancer Screening  02/16/2024   Medicare Annual Wellness (AWV)  10/20/2024   DTaP/Tdap/Td (3 - Td or Tdap) 02/07/2033   Pneumococcal Vaccine: 50+ Years  Completed   Hepatitis B Vaccines  Aged Out   HPV VACCINES  Aged Out   Meningococcal B Vaccine  Aged Out    Health Maintenance  Health Maintenance Due  Topic Date Due   Hepatitis C Screening  Never done   COVID-19 Vaccine (7 - 2024-25 season) 06/28/2023   Health Maintenance Items Addressed: Referral sent to GI for colonoscopy; LUNG CA SCREEN UP TO DATE; NEEDS 2ND SHINGRIX   Additional Screening:  Vision Screening: Recommended annual ophthalmology exams for early detection of glaucoma and other disorders of the eye. Would you like a referral to an eye doctor? No    Dental Screening: Recommended annual dental exams for proper oral hygiene  Community Resource Referral / Chronic Care Management: CRR required this visit?  No   CCM required this visit?  No   Plan:    I have personally reviewed and noted the following in the patient's chart:   Medical and social history Use of alcohol, tobacco or illicit drugs  Current medications and supplements including opioid prescriptions. Patient is not currently taking opioid prescriptions. Functional ability and status Nutritional status Physical activity Advanced directives List of other physicians Hospitalizations, surgeries, and ER visits in previous 12 months Vitals Screenings to include cognitive, depression, and falls Referrals and appointments  In addition, I  have reviewed and discussed with patient certain preventive protocols, quality metrics, and best practice recommendations. A written personalized care plan for preventive services as well as general preventive health recommendations were provided to patient.   Jhonnie GORMAN Das, LPN   2/89/7974   After Visit Summary: (In Person-Declined) Patient declined AVS at this time.  Notes: AMBULATORY REFERRAL FOR COLONOSCOPY SENT

## 2023-10-21 NOTE — Patient Instructions (Addendum)
 Mr. Joshua Schmidt , Thank you for taking time out of your busy schedule to complete your Annual Wellness Visit with me. I enjoyed our conversation and look forward to speaking with you again next year. I, as well as your care team,  appreciate your ongoing commitment to your health goals. Please review the following plan we discussed and let me know if I can assist you in the future.  REFERRAL SENT FOR COLONOSCOPY  Follow up Visits: Next Medicare AWV with our clinical staff:   10/26/24 @ 2:40 IN PERSON Have you seen your provider in the last 6 months (3 months if uncontrolled diabetes)? Yes   Clinician Recommendations:  Aim for 30 minutes of exercise or brisk walking, 6-8 glasses of water, and 5 servings of fruits and vegetables each day. TAKE CARE!      This is a list of the screening recommended for you and due dates:  Health Maintenance  Topic Date Due   Hepatitis C Screening  Never done   COVID-19 Vaccine (7 - 2024-25 season) 06/28/2023   Zoster (Shingles) Vaccine (2 of 2) 10/25/2023*   Colon Cancer Screening  07/25/2024*   Flu Shot  11/12/2023   Screening for Lung Cancer  02/16/2024   Medicare Annual Wellness Visit  10/20/2024   DTaP/Tdap/Td vaccine (3 - Td or Tdap) 02/07/2033   Pneumococcal Vaccine for age over 66  Completed   Hepatitis B Vaccine  Aged Out   HPV Vaccine  Aged Out   Meningitis B Vaccine  Aged Out  *Topic was postponed. The date shown is not the original due date.    Advanced directives: (ACP Link)Information on Advanced Care Planning can be found at [place]  Secretary of [facility] Advance Health Care Directives Advance Health Care Directives. [email or web address]  Advance Care Planning is important because it:  [x]  Makes sure you receive the medical care that is consistent with your values, goals, and preferences  [x]  It provides guidance to your family and loved ones and reduces their decisional burden about whether or not they are making the right decisions based on your  wishes.  Follow the link provided in your after visit summary or read over the paperwork we have mailed to you to help you started getting your Advance Directives in place. If you need assistance in completing these, please reach out to us  so that we can help you!

## 2023-10-23 DIAGNOSIS — I5033 Acute on chronic diastolic (congestive) heart failure: Secondary | ICD-10-CM | POA: Diagnosis not present

## 2023-10-23 DIAGNOSIS — J9601 Acute respiratory failure with hypoxia: Secondary | ICD-10-CM | POA: Diagnosis not present

## 2023-10-23 DIAGNOSIS — J441 Chronic obstructive pulmonary disease with (acute) exacerbation: Secondary | ICD-10-CM | POA: Diagnosis not present

## 2023-10-26 ENCOUNTER — Other Ambulatory Visit: Payer: Self-pay

## 2023-10-26 ENCOUNTER — Ambulatory Visit (INDEPENDENT_AMBULATORY_CARE_PROVIDER_SITE_OTHER): Admitting: Internal Medicine

## 2023-10-26 ENCOUNTER — Encounter: Payer: Self-pay | Admitting: Internal Medicine

## 2023-10-26 VITALS — BP 136/82 | HR 74 | Temp 98.2°F | Resp 18 | Ht 75.0 in | Wt 226.4 lb

## 2023-10-26 DIAGNOSIS — Z1159 Encounter for screening for other viral diseases: Secondary | ICD-10-CM | POA: Diagnosis not present

## 2023-10-26 DIAGNOSIS — Z125 Encounter for screening for malignant neoplasm of prostate: Secondary | ICD-10-CM

## 2023-10-26 DIAGNOSIS — E782 Mixed hyperlipidemia: Secondary | ICD-10-CM | POA: Diagnosis not present

## 2023-10-26 DIAGNOSIS — J449 Chronic obstructive pulmonary disease, unspecified: Secondary | ICD-10-CM | POA: Diagnosis not present

## 2023-10-26 DIAGNOSIS — H6122 Impacted cerumen, left ear: Secondary | ICD-10-CM

## 2023-10-26 DIAGNOSIS — I10 Essential (primary) hypertension: Secondary | ICD-10-CM | POA: Diagnosis not present

## 2023-10-26 NOTE — Progress Notes (Signed)
 Established Patient Office Visit  Subjective    Patient ID: Joshua Schmidt, male    DOB: 06-Sep-1950  Age: 73 y.o. MRN: [id number]  CC:  Chief Complaint  Patient presents with   Medical Management of Chronic Issues    3 month recheck    HPI Joshua Schmidt presents to follow up.   Discussed the use of AI scribe software for clinical note transcription with the patient, who gave verbal consent to proceed.  History of Present Illness Joshua Schmidt is a 73 year old male with COPD who presents for a routine follow-up visit.  He experiences difficulty with heat and humidity, particularly during physical activities like gardening, which he finds exhausting. He takes hydrochlorothiazide  and losartan  for blood pressure, with recent refills, and uses Breztri  regularly for COPD without significant symptom improvement. He also takes medication for cholesterol and carvedilol . He reports some swelling, which he does not consider severe. He has a recent issue with earwax buildup after using Q-tips, which he believes pushed wax further into his ear. He has received the pneumonia and RSV vaccines and is open to receiving the second shingles vaccine if available.   Hypertension: -Medications: Coreg  12.5 mg BID, HCTZ 25 mg, Losartan  50 mg -Patient is compliant with above medications and reports no side effects.   -Failed Meds: Lisinopril  due to facial swelling -Checking BP at home (average): 132-140/80 -Denies any SOB, CP, vision changes or symptoms of hypotension - does have BLE edema but fairly well controlled  -Following with Cardiology  HLD/History of MI/Aortic and Iliac Aneurysms/Chronic Venous Insufficiency:   -Medications: Lipitor 20 mg, aspirin  81 mg -Patient is compliant with above medications and reports no side effects.  -History of MI in 2022 for which he did not seek medical attention at the time, was diagnosed retroactively  -CTA 7/24 with dilated ascending thoracic aorta  4.5 cm, iliac duplex US  8/24 with 4 cm AAA, 3.4 cm right common iliac artery aneurysm without significant growth. - Per Cardiology note, patient no longer a candidate for surgical fixation now that he is oxygen  dependent so routine imaging is no longer required.  Of note, ascending thoracic aortic aneurysm minimally progressive to 4.7 cm on lung CT 11/24 -Last lipid panel: Lipid Panel     Component Value Date/Time   CHOL 108 09/29/2022 0507   TRIG 55 09/29/2022 0507   HDL 56 09/29/2022 0507   CHOLHDL 1.9 09/29/2022 0507   VLDL 11 09/29/2022 0507   LDLCALC 41 09/29/2022 0507   LDLCALC 50 10/21/2021 1145   Erythrocytosis:  -Following with Hematology -Last labs from November showing improvement in hemoglobin to 16.8  COPD: -COPD status: stable -Current medications: Now on Breztri  and Albuterol   -Oxygen  use: yes -requiring 2 L supplemental oxygen  but has been able to maintain oxygen  saturations of 90% or above at rest or with minimal activity -Dyspnea frequency: Occasional  -Cough frequency: Daily, productive  -Limitation of activity: no -Pneumovax: Up to Date -Influenza: Up to Date -Annual lung cancer screening: 11/24 Lung-RADS-2  -Stopped smoking since hospitalization -Now following with Pulmonology, last seen 06/24/23  PTSD: -Not currently on medication -Had done counseling in the past but not interested it anything now  Health Maintance: -Blood work due -Colon cancer screening: colonoscopy 12/23, 3 polyps removed, plan to follow up in 1 year. Referral placed previously  Outpatient Encounter Medications as of 10/26/2023  Medication Sig   aspirin  81 MG chewable tablet Chew 81 mg by mouth daily.  atorvastatin  (LIPITOR) 20 MG tablet Take 1 tablet (20 mg total) by mouth daily.   budeson-glycopyrrolate-formoterol (BREZTRI  AEROSPHERE) 160-9-4.8 MCG/ACT AERO inhaler Inhale 2 puffs into the lungs in the morning and at bedtime.   carvedilol  (COREG ) 12.5 MG tablet Take 1 tablet (12.5 mg  total) by mouth 2 (two) times daily.   fluticasone  (FLONASE ) 50 MCG/ACT nasal spray Place 2 sprays into both nostrils daily.   folic acid  (FOLVITE ) 1 MG tablet Take 1 tablet (1 mg total) by mouth daily.   hydrochlorothiazide  (HYDRODIURIL ) 25 MG tablet Take 1 tablet (25 mg total) by mouth daily.   losartan  (COZAAR ) 50 MG tablet TAKE 1 TABLET BY MOUTH DAILY   Multiple Vitamin (MULTIVITAMIN WITH MINERALS) TABS tablet Take 1 tablet by mouth daily.   VENTOLIN  HFA 108 (90 Base) MCG/ACT inhaler USE 2 INHALATIONS BY MOUTH EVERY 6 HOURS AS NEEDED FOR WHEEZING  OR SHORTNESS OF BREATH   No facility-administered encounter medications on file as of 10/26/2023.    Past Medical History:  Diagnosis Date   Aneurysm (HCC)    COPD (chronic obstructive pulmonary disease) (HCC)    Erythrocytosis 12/01/2018   Hyperlipidemia    Hypertension    Myocardial infarction [facility])     Past Surgical History:  Procedure Laterality Date   APPENDECTOMY     COLONOSCOPY WITH PROPOFOL  N/A 11/20/2014   Procedure: COLONOSCOPY WITH PROPOFOL ;  Surgeon: Louanne KANDICE Muse, MD;  Location: [facility] ENDOSCOPY;  Service: Endoscopy;  Laterality: N/A;   COLONOSCOPY WITH PROPOFOL  N/A 03/19/2022   Procedure: COLONOSCOPY WITH PROPOFOL ;  Surgeon: Unk Corinn Skiff, MD;  Location: [facility] ENDOSCOPY;  Service: Gastroenterology;  Laterality: N/A;   TONSILLECTOMY AND ADENOIDECTOMY      No family history on file.  Social History   Socioeconomic History   Marital status: Married    Spouse name: Not on file   Number of children: Not on file   Years of education: Not on file   Highest education level: Not on file  Occupational History   Not on file  Tobacco Use   Smoking status: Former    Current packs/day: 0.00    Average packs/day: 1 pack/day for 51.0 years (51.0 ttl pk-yrs)    Types: Cigarettes    Start date: 10/01/1971    Quit date: 10/01/2022    Years since quitting: 1.0   Smokeless tobacco: Never  Vaping Use   Vaping status:  Never Used  Substance and Sexual Activity   Alcohol use: Yes   Drug use: No   Sexual activity: Not Currently  Other Topics Concern   Not on file  Social History Narrative   Not on file   Social Drivers of Health   Financial Resource Strain: Low Risk  (10/21/2023)   Overall Financial Resource Strain (CARDIA)    Difficulty of Paying Living Expenses: Not hard at all  Food Insecurity: No Food Insecurity (10/21/2023)   Hunger Vital Sign    Worried About Running Out of Food in the Last Year: Never true    Ran Out of Food in the Last Year: Never true  Transportation Needs: No Transportation Needs (10/21/2023)   PRAPARE - Administrator, Civil Service (Medical): No    Lack of Transportation (Non-Medical): No  Physical Activity: Sufficiently Active (10/21/2023)   Exercise Vital Sign    Days of Exercise per Week: 3 days    Minutes of Exercise per Session: 60 min  Stress: No Stress Concern Present (10/21/2023)   [employer] of Occupational  Health - Occupational Stress Questionnaire    Feeling of Stress: Only a little  Social Connections: Moderately Integrated (10/21/2023)   Social Connection and Isolation Panel    Frequency of Communication with Friends and Family: More than three times a week    Frequency of Social Gatherings with Friends and Family: Twice a week    Attends Religious Services: More than 4 times per year    Active Member of [employer] or Organizations: No    Attends Banker Meetings: Never    Marital Status: Married  Catering manager Violence: Not At Risk (10/21/2023)   Humiliation, Afraid, Rape, and Kick questionnaire    Fear of Current or Ex-Partner: No    Emotionally Abused: No    Physically Abused: No    Sexually Abused: No    Review of Systems  All other systems reviewed and are negative.       Objective    BP 136/82 (Cuff Size: Large)   Pulse 74   Temp 98.2 F (36.8 C) (Oral)   Resp 18   Ht 6' 3 (1.905 m)   Wt 226 lb 6.4 oz  (102.7 kg)   SpO2 94%   BMI 28.30 kg/m   Physical Exam Constitutional:      Appearance: Normal appearance.  HENT:     Head: Normocephalic and atraumatic.  Eyes:     Conjunctiva/sclera: Conjunctivae normal.  Cardiovascular:     Rate and Rhythm: Normal rate and regular rhythm.  Pulmonary:     Effort: Pulmonary effort is normal.     Breath sounds: Normal breath sounds.     Comments: On 2 L [place] Musculoskeletal:     Right lower leg: Edema present.     Left lower leg: Edema present.     Comments: Mild BLE pitting edema 1+  Skin:    General: Skin is warm and dry.  Neurological:     General: No focal deficit present.     Mental Status: He is alert. Mental status is at baseline.  Psychiatric:        Mood and Affect: Mood normal.        Behavior: Behavior normal.     Last CBC Lab Results  Component Value Date   WBC 7.2 02/22/2023   HGB 16.8 02/22/2023   HCT 50.3 02/22/2023   MCV 97.3 02/22/2023   MCH 32.5 02/22/2023   RDW 12.5 02/22/2023   PLT 191 02/22/2023   Last metabolic panel Lab Results  Component Value Date   GLUCOSE 90 11/20/2022   NA 140 11/20/2022   K 4.4 11/20/2022   CL 101 11/20/2022   CO2 33 (H) 11/20/2022   BUN 11 11/20/2022   CREATININE 0.58 (L) 11/20/2022   GFRNONAA >60 10/01/2022   CALCIUM  9.3 11/20/2022   PROT 6.3 11/20/2022   ALBUMIN 3.6 01/22/2022   BILITOT 0.3 11/20/2022   ALKPHOS 58 01/22/2022   AST 16 11/20/2022   ALT 17 11/20/2022   ANIONGAP 11 10/01/2022   Last lipids Lab Results  Component Value Date   CHOL 108 09/29/2022   HDL 56 09/29/2022   LDLCALC 41 09/29/2022   TRIG 55 09/29/2022   CHOLHDL 1.9 09/29/2022   Last hemoglobin A1c Lab Results  Component Value Date   HGBA1C 5.4 09/28/2022   Last thyroid  functions Lab Results  Component Value Date   TSH 0.69 10/21/2021   Last vitamin D No results found for: 25OHVITD2, 25OHVITD3, VD25OH Last vitamin B12 and Folate Lab Results  Component  Value Date   VITAMINB12  839 11/20/2022   FOLATE >24.0 11/20/2022        Assessment & Plan:   Assessment & Plan Chronic Obstructive Pulmonary Disease (COPD) COPD well-managed with Breztri  inhaler, containing steroid, long-acting bronchodilator, and muscarinic agent. - Continue Breztri  inhaler as prescribed.  Earwax Impaction Earwax buildup in left ear causing discomfort. Debrox ineffective. - Perform ear irrigation with warm water and hydrogen peroxide.  Hypertension Blood pressure controlled at 136/82 mmHg with hydrochlorothiazide  and losartan . - Continue hydrochlorothiazide  and losartan  as prescribed.  Hyperlipidemia Cholesterol management ongoing with current regimen. - Continue current cholesterol medication as prescribed. - Order lipid panel as part of yearly labs.  General Health Maintenance Up to date on most vaccinations. Shingles vaccination series incomplete. Yearly labs due. - Administer second shingles vaccine if available at pharmacy. - Order yearly labs including CBC, kidney and liver function tests, cholesterol, and PSA.  Follow-up Routine follow-up planned to monitor health conditions and review labs. - Schedule follow-up appointment in six months. - Ensure lab results are reviewed and discussed at next visit.  - CBC w/Diff/Platelet - Comprehensive Metabolic Panel (CMET) - Lipid Profile - Hepatitis C Antibody - PSA - Ear Lavage   Return in about 6 months (around 04/27/2024).   Sharyle Fischer, DO

## 2023-10-27 LAB — COMPREHENSIVE METABOLIC PANEL WITH GFR
AG Ratio: 1.3 (calc) (ref 1.0–2.5)
ALT: 18 U/L (ref 9–46)
AST: 18 U/L (ref 10–35)
Albumin: 3.8 g/dL (ref 3.6–5.1)
Alkaline phosphatase (APISO): 60 U/L (ref 35–144)
BUN/Creatinine Ratio: 18 (calc) (ref 6–22)
BUN: 12 mg/dL (ref 7–25)
CO2: 34 mmol/L — ABNORMAL HIGH (ref 20–32)
Calcium: 9.1 mg/dL (ref 8.6–10.3)
Chloride: 97 mmol/L — ABNORMAL LOW (ref 98–110)
Creat: 0.66 mg/dL — ABNORMAL LOW (ref 0.70–1.28)
Globulin: 2.9 g/dL (ref 1.9–3.7)
Glucose, Bld: 100 mg/dL — ABNORMAL HIGH (ref 65–99)
Potassium: 4.4 mmol/L (ref 3.5–5.3)
Sodium: 138 mmol/L (ref 135–146)
Total Bilirubin: 0.6 mg/dL (ref 0.2–1.2)
Total Protein: 6.7 g/dL (ref 6.1–8.1)
eGFR: 99 mL/min/1.73m2 (ref >=60)

## 2023-10-27 LAB — LIPID PANEL
Cholesterol: 127 mg/dL (ref <200)
HDL: 58 mg/dL (ref >=40)
LDL Cholesterol (Calc): 54 mg/dL
Non-HDL Cholesterol (Calc): 69 mg/dL (ref <130)
Total CHOL/HDL Ratio: 2.2 (calc) (ref <5.0)
Triglycerides: 74 mg/dL (ref <150)

## 2023-10-27 LAB — CBC WITH DIFFERENTIAL/PLATELET
Absolute Lymphocytes: 824 {cells}/uL — ABNORMAL LOW (ref 850–3900)
Absolute Monocytes: 482 {cells}/uL (ref 200–950)
Basophils Absolute: 40 {cells}/uL (ref 0–200)
Basophils Relative: 0.6 %
Eosinophils Absolute: 141 {cells}/uL (ref 15–500)
Eosinophils Relative: 2.1 %
HCT: 55.3 % — ABNORMAL HIGH (ref 38.5–50.0)
Hemoglobin: 18 g/dL — ABNORMAL HIGH (ref 13.2–17.1)
MCH: 33.2 pg — ABNORMAL HIGH (ref 27.0–33.0)
MCHC: 32.5 g/dL (ref 32.0–36.0)
MCV: 102 fL — ABNORMAL HIGH (ref 80.0–100.0)
MPV: 10 fL (ref 7.5–12.5)
Monocytes Relative: 7.2 %
Neutro Abs: 5213 {cells}/uL (ref 1500–7800)
Neutrophils Relative %: 77.8 %
Platelets: 164 Thousand/uL (ref 140–400)
RBC: 5.42 Million/uL (ref 4.20–5.80)
RDW: 13 % (ref 11.0–15.0)
Total Lymphocyte: 12.3 %
WBC: 6.7 Thousand/uL (ref 3.8–10.8)

## 2023-10-27 LAB — HEPATITIS C ANTIBODY: Hepatitis C Ab: NONREACTIVE

## 2023-10-27 LAB — PSA: PSA: 0.83 ng/mL (ref <=4.00)

## 2023-10-28 ENCOUNTER — Ambulatory Visit: Payer: Self-pay | Admitting: Internal Medicine

## 2023-11-03 DIAGNOSIS — J45909 Unspecified asthma, uncomplicated: Secondary | ICD-10-CM | POA: Diagnosis not present

## 2023-11-23 DIAGNOSIS — I5033 Acute on chronic diastolic (congestive) heart failure: Secondary | ICD-10-CM | POA: Diagnosis not present

## 2023-11-23 DIAGNOSIS — J441 Chronic obstructive pulmonary disease with (acute) exacerbation: Secondary | ICD-10-CM | POA: Diagnosis not present

## 2023-11-23 DIAGNOSIS — J9601 Acute respiratory failure with hypoxia: Secondary | ICD-10-CM | POA: Diagnosis not present

## 2023-11-28 ENCOUNTER — Inpatient Hospital Stay

## 2023-11-28 ENCOUNTER — Emergency Department

## 2023-11-28 ENCOUNTER — Inpatient Hospital Stay
Admission: EM | Admit: 2023-11-28 | Discharge: 2023-12-04 | DRG: 208 | Disposition: A | Discharge status: Home / Self Care | Attending: Student | Admitting: Student

## 2023-11-28 ENCOUNTER — Other Ambulatory Visit: Payer: Self-pay

## 2023-11-28 DIAGNOSIS — I5A Non-ischemic myocardial injury (non-traumatic): Secondary | ICD-10-CM | POA: Diagnosis present

## 2023-11-28 DIAGNOSIS — I509 Heart failure, unspecified: Principal | ICD-10-CM

## 2023-11-28 DIAGNOSIS — Z7951 Long term (current) use of inhaled steroids: Secondary | ICD-10-CM

## 2023-11-28 DIAGNOSIS — R911 Solitary pulmonary nodule: Secondary | ICD-10-CM | POA: Diagnosis present

## 2023-11-28 DIAGNOSIS — M7989 Other specified soft tissue disorders: Secondary | ICD-10-CM | POA: Diagnosis not present

## 2023-11-28 DIAGNOSIS — R4182 Altered mental status, unspecified: Secondary | ICD-10-CM

## 2023-11-28 DIAGNOSIS — Z86718 Personal history of other venous thrombosis and embolism: Secondary | ICD-10-CM | POA: Diagnosis not present

## 2023-11-28 DIAGNOSIS — I252 Old myocardial infarction: Secondary | ICD-10-CM | POA: Diagnosis not present

## 2023-11-28 DIAGNOSIS — J690 Pneumonitis due to inhalation of food and vomit: Secondary | ICD-10-CM | POA: Diagnosis not present

## 2023-11-28 DIAGNOSIS — M8448XA Pathological fracture, other site, initial encounter for fracture: Secondary | ICD-10-CM | POA: Diagnosis present

## 2023-11-28 DIAGNOSIS — J9620 Acute and chronic respiratory failure, unspecified whether with hypoxia or hypercapnia: Secondary | ICD-10-CM | POA: Diagnosis not present

## 2023-11-28 DIAGNOSIS — I5031 Acute diastolic (congestive) heart failure: Secondary | ICD-10-CM | POA: Diagnosis not present

## 2023-11-28 DIAGNOSIS — R7989 Other specified abnormal findings of blood chemistry: Secondary | ICD-10-CM | POA: Diagnosis not present

## 2023-11-28 DIAGNOSIS — G9340 Encephalopathy, unspecified: Secondary | ICD-10-CM | POA: Diagnosis not present

## 2023-11-28 DIAGNOSIS — Z9049 Acquired absence of other specified parts of digestive tract: Secondary | ICD-10-CM | POA: Diagnosis not present

## 2023-11-28 DIAGNOSIS — D751 Secondary polycythemia: Secondary | ICD-10-CM | POA: Diagnosis present

## 2023-11-28 DIAGNOSIS — G928 Other toxic encephalopathy: Secondary | ICD-10-CM | POA: Diagnosis not present

## 2023-11-28 DIAGNOSIS — F17210 Nicotine dependence, cigarettes, uncomplicated: Secondary | ICD-10-CM | POA: Diagnosis present

## 2023-11-28 DIAGNOSIS — F4310 Post-traumatic stress disorder, unspecified: Secondary | ICD-10-CM | POA: Diagnosis present

## 2023-11-28 DIAGNOSIS — I7143 Infrarenal abdominal aortic aneurysm, without rupture: Secondary | ICD-10-CM | POA: Diagnosis not present

## 2023-11-28 DIAGNOSIS — J9621 Acute and chronic respiratory failure with hypoxia: Secondary | ICD-10-CM | POA: Diagnosis present

## 2023-11-28 DIAGNOSIS — G4733 Obstructive sleep apnea (adult) (pediatric): Secondary | ICD-10-CM | POA: Diagnosis present

## 2023-11-28 DIAGNOSIS — Z79899 Other long term (current) drug therapy: Secondary | ICD-10-CM

## 2023-11-28 DIAGNOSIS — I5033 Acute on chronic diastolic (congestive) heart failure: Secondary | ICD-10-CM | POA: Diagnosis not present

## 2023-11-28 DIAGNOSIS — R0989 Other specified symptoms and signs involving the circulatory and respiratory systems: Secondary | ICD-10-CM | POA: Diagnosis not present

## 2023-11-28 DIAGNOSIS — R0902 Hypoxemia: Secondary | ICD-10-CM | POA: Diagnosis not present

## 2023-11-28 DIAGNOSIS — I110 Hypertensive heart disease with heart failure: Secondary | ICD-10-CM | POA: Diagnosis not present

## 2023-11-28 DIAGNOSIS — Z4682 Encounter for fitting and adjustment of non-vascular catheter: Secondary | ICD-10-CM | POA: Diagnosis not present

## 2023-11-28 DIAGNOSIS — I517 Cardiomegaly: Secondary | ICD-10-CM | POA: Diagnosis not present

## 2023-11-28 DIAGNOSIS — I2510 Atherosclerotic heart disease of native coronary artery without angina pectoris: Secondary | ICD-10-CM | POA: Diagnosis not present

## 2023-11-28 DIAGNOSIS — E8729 Other acidosis: Secondary | ICD-10-CM | POA: Diagnosis present

## 2023-11-28 DIAGNOSIS — J441 Chronic obstructive pulmonary disease with (acute) exacerbation: Secondary | ICD-10-CM | POA: Diagnosis not present

## 2023-11-28 DIAGNOSIS — Z8679 Personal history of other diseases of the circulatory system: Secondary | ICD-10-CM

## 2023-11-28 DIAGNOSIS — F1090 Alcohol use, unspecified, uncomplicated: Secondary | ICD-10-CM | POA: Diagnosis not present

## 2023-11-28 DIAGNOSIS — R7401 Elevation of levels of liver transaminase levels: Secondary | ICD-10-CM

## 2023-11-28 DIAGNOSIS — I63522 Cerebral infarction due to unspecified occlusion or stenosis of left anterior cerebral artery: Secondary | ICD-10-CM | POA: Diagnosis not present

## 2023-11-28 DIAGNOSIS — Z7982 Long term (current) use of aspirin: Secondary | ICD-10-CM | POA: Diagnosis not present

## 2023-11-28 DIAGNOSIS — E785 Hyperlipidemia, unspecified: Secondary | ICD-10-CM | POA: Diagnosis present

## 2023-11-28 DIAGNOSIS — J45909 Unspecified asthma, uncomplicated: Secondary | ICD-10-CM | POA: Diagnosis not present

## 2023-11-28 DIAGNOSIS — J9601 Acute respiratory failure with hypoxia: Secondary | ICD-10-CM | POA: Diagnosis not present

## 2023-11-28 DIAGNOSIS — I7121 Aneurysm of the ascending aorta, without rupture: Secondary | ICD-10-CM | POA: Diagnosis not present

## 2023-11-28 DIAGNOSIS — G936 Cerebral edema: Secondary | ICD-10-CM

## 2023-11-28 DIAGNOSIS — J9622 Acute and chronic respiratory failure with hypercapnia: Principal | ICD-10-CM | POA: Diagnosis present

## 2023-11-28 DIAGNOSIS — E873 Alkalosis: Secondary | ICD-10-CM | POA: Diagnosis not present

## 2023-11-28 DIAGNOSIS — I712 Thoracic aortic aneurysm, without rupture, unspecified: Secondary | ICD-10-CM | POA: Diagnosis not present

## 2023-11-28 DIAGNOSIS — J9691 Respiratory failure, unspecified with hypoxia: Secondary | ICD-10-CM | POA: Diagnosis not present

## 2023-11-28 DIAGNOSIS — R918 Other nonspecific abnormal finding of lung field: Secondary | ICD-10-CM | POA: Diagnosis not present

## 2023-11-28 DIAGNOSIS — I77819 Aortic ectasia, unspecified site: Secondary | ICD-10-CM | POA: Diagnosis not present

## 2023-11-28 DIAGNOSIS — I771 Stricture of artery: Secondary | ICD-10-CM | POA: Diagnosis not present

## 2023-11-28 HISTORY — DX: Atherosclerotic heart disease of native coronary artery without angina pectoris: I25.10

## 2023-11-28 LAB — COMPREHENSIVE METABOLIC PANEL WITH GFR
ALT: 68 U/L — ABNORMAL HIGH (ref 0–44)
AST: 42 U/L — ABNORMAL HIGH (ref 15–41)
Albumin: 3.5 g/dL (ref 3.5–5.0)
Alkaline Phosphatase: 66 U/L (ref 38–126)
Anion gap: 11 (ref 5–15)
BUN: 31 mg/dL — ABNORMAL HIGH (ref 8–23)
CO2: 32 mmol/L (ref 22–32)
Calcium: 9.2 mg/dL (ref 8.9–10.3)
Chloride: 94 mmol/L — ABNORMAL LOW (ref 98–111)
Creatinine, Ser: 0.89 mg/dL (ref 0.61–1.24)
GFR, Estimated: >60 mL/min (ref >60)
Glucose, Bld: 122 mg/dL — ABNORMAL HIGH (ref 70–99)
Potassium: 3.9 mmol/L (ref 3.5–5.1)
Sodium: 137 mmol/L (ref 135–145)
Total Bilirubin: 1 mg/dL (ref 0.0–1.2)
Total Protein: 7 g/dL (ref 6.5–8.1)

## 2023-11-28 LAB — BLOOD GAS, ARTERIAL
Acid-Base Excess: 6.5 mmol/L — ABNORMAL HIGH (ref 0.0–2.0)
Acid-Base Excess: 8.1 mmol/L — ABNORMAL HIGH (ref 0.0–2.0)
Bicarbonate: 39.9 mmol/L — ABNORMAL HIGH (ref 20.0–28.0)
Bicarbonate: 37.3 mmol/L — ABNORMAL HIGH (ref 20.0–28.0)
FIO2: 100 %
MECHVT: 450 mL
Mechanical Rate: 20
O2 Content: 15 L/min
O2 Saturation: 84.9 %
O2 Saturation: 99.2 %
PEEP: 10 cmH2O
Patient temperature: 37
Patient temperature: 37
pCO2 arterial: 102 mmHg (ref 32–48)
pCO2 arterial: 74 mmHg (ref 32–48)
pH, Arterial: 7.2 — ABNORMAL LOW (ref 7.35–7.45)
pH, Arterial: 7.31 — ABNORMAL LOW (ref 7.35–7.45)
pO2, Arterial: 56 mmHg — ABNORMAL LOW (ref 83–108)
pO2, Arterial: 126 mmHg — ABNORMAL HIGH (ref 83–108)

## 2023-11-28 LAB — LACTIC ACID, PLASMA: Lactic Acid, Venous: 1.3 mmol/L (ref 0.5–1.9)

## 2023-11-28 LAB — D-DIMER, QUANTITATIVE: D-Dimer, Quant: 2.39 ug{FEU}/mL — ABNORMAL HIGH (ref 0.00–0.50)

## 2023-11-28 LAB — BLOOD GAS, VENOUS

## 2023-11-28 LAB — TROPONIN I (HIGH SENSITIVITY): Troponin I (High Sensitivity): 157 ng/L (ref <18)

## 2023-11-28 LAB — BRAIN NATRIURETIC PEPTIDE: B Natriuretic Peptide: 752.8 pg/mL — ABNORMAL HIGH (ref 0.0–100.0)

## 2023-11-28 LAB — URINALYSIS, ROUTINE W REFLEX MICROSCOPIC
Bilirubin Urine: NEGATIVE
Glucose, UA: NEGATIVE mg/dL
Hgb urine dipstick: NEGATIVE
Ketones, ur: NEGATIVE mg/dL
Leukocytes,Ua: NEGATIVE
Nitrite: NEGATIVE
Protein, ur: NEGATIVE mg/dL
Specific Gravity, Urine: 1.033 — ABNORMAL HIGH (ref 1.005–1.030)
pH: 5 (ref 5.0–8.0)

## 2023-11-28 LAB — CBC
HCT: 50.7 % (ref 39.0–52.0)
Hemoglobin: 16.4 g/dL (ref 13.0–17.0)
MCH: 31.6 pg (ref 26.0–34.0)
MCHC: 32.3 g/dL (ref 30.0–36.0)
MCV: 97.7 fL (ref 80.0–100.0)
Platelets: 237 K/uL (ref 150–400)
RBC: 5.19 MIL/uL (ref 4.22–5.81)
RDW: 14.1 % (ref 11.5–15.5)
WBC: 8.3 K/uL (ref 4.0–10.5)
nRBC: 0.2 % (ref 0.0–0.2)

## 2023-11-28 MED ORDER — HYDROCODONE-ACETAMINOPHEN 5-325 MG PO TABS: 1.0000 | ORAL_TABLET | ORAL | Status: DC | PRN

## 2023-11-28 MED ORDER — ENOXAPARIN SODIUM 40 MG/0.4ML IJ SOSY
40.0000 mg | PREFILLED_SYRINGE | INTRAMUSCULAR | Status: DC
  Administered 2023-11-29 – 2023-12-01 (×4): 40 mg via SUBCUTANEOUS
  Filled 2023-11-28 (×4): qty 0.4

## 2023-11-28 MED ORDER — IPRATROPIUM-ALBUTEROL 0.5-2.5 (3) MG/3ML IN SOLN: 3.0000 mL | RESPIRATORY_TRACT | Status: DC | PRN

## 2023-11-28 MED ORDER — DOCUSATE SODIUM 50 MG/5ML PO LIQD
100.0000 mg | Freq: Two times a day (BID) | ORAL | Status: DC
  Administered 2023-11-29 – 2023-11-30 (×4): 100 mg
  Filled 2023-11-28 (×4): qty 10

## 2023-11-28 MED ORDER — METHYLPREDNISOLONE SODIUM SUCC 125 MG IJ SOLR
125.0000 mg | Freq: Once | INTRAMUSCULAR | Status: AC
  Administered 2023-11-28: 125 mg via INTRAVENOUS
  Filled 2023-11-28: qty 2

## 2023-11-28 MED ORDER — SODIUM CHLORIDE 0.9 % IV SOLN
500.0000 mg | INTRAVENOUS | Status: DC
  Administered 2023-11-28 – 2023-11-30 (×3): 500 mg via INTRAVENOUS
  Filled 2023-11-28 (×4): qty 5

## 2023-11-28 MED ORDER — FENTANYL CITRATE PF 50 MCG/ML IJ SOSY
25.0000 ug | PREFILLED_SYRINGE | INTRAMUSCULAR | Status: DC | PRN
  Administered 2023-11-29 – 2023-11-30 (×3): 100 ug via INTRAVENOUS
  Filled 2023-11-28 (×3): qty 2

## 2023-11-28 MED ORDER — ACETAMINOPHEN 325 MG PO TABS
650.0000 mg | ORAL_TABLET | Freq: Four times a day (QID) | ORAL | Status: DC | PRN
Start: 2023-11-28 — End: 2023-11-28

## 2023-11-28 MED ORDER — ACETAMINOPHEN 325 MG PO TABS: 650.0000 mg | ORAL_TABLET | Freq: Four times a day (QID) | ORAL | Status: DC | PRN

## 2023-11-28 MED ORDER — ONDANSETRON HCL 4 MG PO TABS: 4.0000 mg | ORAL_TABLET | Freq: Four times a day (QID) | ORAL | Status: DC | PRN

## 2023-11-28 MED ORDER — SODIUM CHLORIDE 0.9 % IV SOLN: 250.0000 mL | INTRAVENOUS | Status: DC | PRN

## 2023-11-28 MED ORDER — ROCURONIUM BROMIDE 10 MG/ML (PF) SYRINGE
PREFILLED_SYRINGE | INTRAVENOUS | Status: AC
  Administered 2023-11-28: 100 mg via INTRAVENOUS
  Filled 2023-11-28: qty 10

## 2023-11-28 MED ORDER — FUROSEMIDE 10 MG/ML IJ SOLN
20.0000 mg | Freq: Once | INTRAMUSCULAR | Status: AC
  Administered 2023-11-28: 20 mg via INTRAVENOUS
  Filled 2023-11-28: qty 4

## 2023-11-28 MED ORDER — SENNOSIDES-DOCUSATE SODIUM 8.6-50 MG PO TABS: 1.0000 | ORAL_TABLET | Freq: Every evening | ORAL | Status: DC | PRN

## 2023-11-28 MED ORDER — IPRATROPIUM-ALBUTEROL 0.5-2.5 (3) MG/3ML IN SOLN
3.0000 mL | Freq: Four times a day (QID) | RESPIRATORY_TRACT | Status: DC
  Administered 2023-11-29 – 2023-11-30 (×7): 3 mL via RESPIRATORY_TRACT
  Filled 2023-11-28 (×7): qty 3

## 2023-11-28 MED ORDER — MORPHINE SULFATE (PF) 2 MG/ML IV SOLN: 1.0000 mg | Freq: Four times a day (QID) | INTRAVENOUS | Status: DC | PRN

## 2023-11-28 MED ORDER — PROPOFOL 1000 MG/100ML IV EMUL
0.0000 ug/kg/min | INTRAVENOUS | Status: DC
  Administered 2023-11-28: 20 ug/kg/min via INTRAVENOUS
  Administered 2023-11-29: 35 ug/kg/min via INTRAVENOUS
  Administered 2023-11-29 (×3): 40 ug/kg/min via INTRAVENOUS
  Administered 2023-11-29: 35 ug/kg/min via INTRAVENOUS
  Administered 2023-11-30: 40 ug/kg/min via INTRAVENOUS
  Administered 2023-11-30: 50 ug/kg/min via INTRAVENOUS
  Filled 2023-11-28 (×9): qty 100

## 2023-11-28 MED ORDER — ACETAMINOPHEN 650 MG RE SUPP: 650.0000 mg | Freq: Four times a day (QID) | RECTAL | Status: DC | PRN

## 2023-11-28 MED ORDER — SODIUM CHLORIDE 0.9% FLUSH: 3.0000 mL | INTRAVENOUS | Status: DC | PRN

## 2023-11-28 MED ORDER — FUROSEMIDE 10 MG/ML IJ SOLN
40.0000 mg | Freq: Once | INTRAMUSCULAR | Status: AC
  Administered 2023-11-29: 40 mg via INTRAVENOUS
  Filled 2023-11-28: qty 4

## 2023-11-28 MED ORDER — SODIUM CHLORIDE 0.9 % IV SOLN
3.0000 g | Freq: Four times a day (QID) | INTRAVENOUS | Status: AC
  Administered 2023-11-29 – 2023-12-01 (×12): 3 g via INTRAVENOUS
  Filled 2023-11-28 (×12): qty 8

## 2023-11-28 MED ORDER — POLYETHYLENE GLYCOL 3350 17 G PO PACK: 17.0000 g | PACK | Freq: Every day | ORAL | Status: DC | PRN

## 2023-11-28 MED ORDER — FENTANYL CITRATE PF 50 MCG/ML IJ SOSY: 25.0000 ug | PREFILLED_SYRINGE | INTRAMUSCULAR | Status: DC | PRN

## 2023-11-28 MED ORDER — ORAL CARE MOUTH RINSE
15.0000 mL | OROMUCOSAL | Status: DC | PRN
Start: 2023-11-28 — End: 2023-12-04

## 2023-11-28 MED ORDER — ETOMIDATE 2 MG/ML IV SOLN
INTRAVENOUS | Status: AC
  Administered 2023-11-28: 30 mg via INTRAVENOUS
  Filled 2023-11-28: qty 20

## 2023-11-28 MED ORDER — ROCURONIUM BROMIDE 10 MG/ML (PF) SYRINGE: 100.0000 mg | PREFILLED_SYRINGE | Freq: Once | INTRAVENOUS | Status: AC

## 2023-11-28 MED ORDER — IOHEXOL 350 MG/ML SOLN
100.0000 mL | Freq: Once | INTRAVENOUS | Status: AC | PRN
  Administered 2023-11-28: 100 mL via INTRAVENOUS

## 2023-11-28 MED ORDER — SODIUM CHLORIDE 0.9% FLUSH
3.0000 mL | Freq: Two times a day (BID) | INTRAVENOUS | Status: DC
Start: 2023-11-28 — End: 2023-11-28

## 2023-11-28 MED ORDER — POLYETHYLENE GLYCOL 3350 17 G PO PACK
17.0000 g | PACK | Freq: Every day | ORAL | Status: DC
  Administered 2023-11-29 – 2023-12-01 (×3): 17 g
  Filled 2023-11-28 (×3): qty 1

## 2023-11-28 MED ORDER — FAMOTIDINE 20 MG PO TABS
20.0000 mg | ORAL_TABLET | Freq: Two times a day (BID) | ORAL | Status: DC
  Filled 2023-11-28: qty 1

## 2023-11-28 MED ORDER — ENOXAPARIN SODIUM 40 MG/0.4ML IJ SOSY: 40.0000 mg | PREFILLED_SYRINGE | INTRAMUSCULAR | Status: DC

## 2023-11-28 MED ORDER — CHLORHEXIDINE GLUCONATE CLOTH 2 % EX PADS
6.0000 | MEDICATED_PAD | Freq: Every day | CUTANEOUS | Status: DC
  Administered 2023-11-29 – 2023-12-04 (×6): 6 via TOPICAL

## 2023-11-28 MED ORDER — BUDESONIDE 0.25 MG/2ML IN SUSP
0.2500 mg | Freq: Two times a day (BID) | RESPIRATORY_TRACT | Status: DC
  Administered 2023-11-28 – 2023-12-02 (×8): 0.25 mg via RESPIRATORY_TRACT
  Filled 2023-11-28 (×8): qty 2

## 2023-11-28 MED ORDER — METHYLPREDNISOLONE SODIUM SUCC 40 MG IJ SOLR
40.0000 mg | Freq: Two times a day (BID) | INTRAMUSCULAR | Status: DC
  Administered 2023-11-29 – 2023-11-30 (×3): 40 mg via INTRAVENOUS
  Filled 2023-11-28 (×3): qty 1

## 2023-11-28 MED ORDER — BISACODYL 5 MG PO TBEC
5.0000 mg | DELAYED_RELEASE_TABLET | Freq: Every day | ORAL | Status: DC | PRN
Start: 2023-11-28 — End: 2023-11-28

## 2023-11-28 MED ORDER — FUROSEMIDE 10 MG/ML IJ SOLN: 40.0000 mg | Freq: Every day | INTRAMUSCULAR | Status: DC

## 2023-11-28 MED ORDER — TRAZODONE HCL 50 MG PO TABS
25.0000 mg | ORAL_TABLET | Freq: Every evening | ORAL | Status: DC | PRN
Start: 2023-11-28 — End: 2023-11-28

## 2023-11-28 MED ORDER — IPRATROPIUM-ALBUTEROL 0.5-2.5 (3) MG/3ML IN SOLN
9.0000 mL | Freq: Once | RESPIRATORY_TRACT | Status: AC
  Administered 2023-11-28: 9 mL via RESPIRATORY_TRACT
  Filled 2023-11-28: qty 9

## 2023-11-28 MED ORDER — DOCUSATE SODIUM 100 MG PO CAPS: 100.0000 mg | ORAL_CAPSULE | Freq: Two times a day (BID) | ORAL | Status: DC | PRN

## 2023-11-28 MED ORDER — DOCUSATE SODIUM 50 MG/5ML PO LIQD: 100.0000 mg | Freq: Two times a day (BID) | ORAL | Status: DC | PRN

## 2023-11-28 MED ORDER — ORAL CARE MOUTH RINSE
15.0000 mL | OROMUCOSAL | Status: DC
  Administered 2023-11-28 – 2023-12-03 (×51): 15 mL via OROMUCOSAL
  Filled 2023-11-28 (×5): qty 15

## 2023-11-28 MED ORDER — FOLIC ACID 1 MG PO TABS
1.0000 mg | ORAL_TABLET | Freq: Every day | ORAL | Status: DC
  Administered 2023-11-29 – 2023-11-30 (×2): 1 mg
  Filled 2023-11-28 (×2): qty 1

## 2023-11-28 MED ORDER — ONDANSETRON HCL 4 MG/2ML IJ SOLN
4.0000 mg | Freq: Four times a day (QID) | INTRAMUSCULAR | Status: DC | PRN
Start: 2023-11-28 — End: 2023-12-04

## 2023-11-28 MED ORDER — SODIUM CHLORIDE 0.9% FLUSH
3.0000 mL | Freq: Two times a day (BID) | INTRAVENOUS | Status: DC
  Administered 2023-11-28 – 2023-12-04 (×12): 3 mL via INTRAVENOUS

## 2023-11-28 MED ORDER — ETOMIDATE 2 MG/ML IV SOLN: 30.0000 mg | Freq: Once | INTRAVENOUS | Status: AC

## 2023-11-28 NOTE — ED Notes (Signed)
 Patient taken to CT with this RN.  When removing BiPap mask, excessive secretions noted in mask.  RT called and BiPap mask was removed.  When patient returned from CT, patient noted to be cyanotic with oxygen  saturation of 75% on 6L [place].  NRB placed, RT at bedside drawing ABG and consulting with MD. Patient remains altered, withdraws from painful stimuli. Rhonci noted in all lung fields.

## 2023-11-28 NOTE — H&P (Incomplete)
 NAME:  Joshua Schmidt, MRN:  [id number], DOB:  [birth year], LOS: 0 ADMISSION DATE:  11/28/2023, CONSULTATION DATE:  11/28/23 REFERRING MD:  Dr. Waymond, CHIEF COMPLAINT: Hypoxia and AMS    History of Present Illness:  73 yo M presenting to [facility] ED from home for evaluation of hypoxia and AMS since the afternoon of 11/28/23 around 4 pm.  History obtained per chart review and spouse telephone report as patient is intubated and sedated at this time. Patient didn't wake up until the afternoon and acting irrationally and was confused. Spouse also discovered he had removed his oxygen  overnight. Starting Sat night  Confusion, hypoxia- without Sour John in the AM A&O x 2 typically A&O x 4.  Baseline 4 L [place], hypoxic high 80's low 90's AECOPD & CHF   Later better, belly breathing > BIPAP ABG, somnolent *** ED course: During transport to CT angio . Medications given: *** Initial Vitals: *** Significant labs: (Labs/ Imaging personally reviewed) I, Jenita Ruth Rust-Chester, AGACNP-BC, personally viewed and interpreted this ECG. EKG Interpretation: Date: 11/28/23, EKG Time: 17:54, Rate: 74, Rhythm: NSR, QRS Axis:  RAD,  Intervals: 1st degree HB, ST/T Wave abnormalities: T wave inversions V1-V5, Narrative Interpretation: NSR Chemistry: Na+: 137, K+: 3.9, BUN/Cr.: 31/ 0.89, Serum CO2/ AG: 32/ 11, AST/ALT: 42/ 68 Hematology: WBC: 8.3, Hgb: 16.4,  BNP: 752.8, Lactic/ PCT: 1.3/ pending  VBG: 7.29/ 74/ 59/ 35.6 >> ABG: 7.2/ 102/ 56/ 39.9 CXR 11/28/23: Cardiomegaly and pulmonary vascular congestion. Interstitial coarsening in the lower lungs, favor edema. CT angio chest PE 11/28/23:  Negative for pulmonary embolism. Peri-bronchovascular consolidation in the right lower lobe, left lingula, greatest in the left lower lobe. Findings may be due to atelectasis however aspiration or pneumonia could appear similarly. Acute or subacute appearing fracture of T10 with 25% vertebral body height loss and no retropulsion. Ascending  aortic aneurysm measuring 47 mm in diameter. This is not substantially changed from 02/16/2023 using similar measuring technique. Aortic Atherosclerosis (ICD10-I70.0).  PCCM consulted for admission due to ***. Pertinent  Medical History  Stage 3 Severe COPD 2 L  Former smoker (09/2022)- 51 pack year history HFpEF DVT ETOH abuse Erythrocytosis followed by hematology PTSD  Thoracic aortic aneurysm f/b cardiology & vascular Lung nodule HLD CAD OSA  Significant Hospital Events: Including procedures, antibiotic start and stop dates in addition to other pertinent events     Interim History / Subjective:  ***  Objective    Blood pressure (!) 149/84, pulse 82, temperature 99.1 F (37.3 C), temperature source Oral, resp. rate (!) 35, height 6' 2 (1.88 m), weight 102.1 kg, SpO2 (!) 84%.    FiO2 (%):  [100 %] 100 %  No intake or output data in the 24 hours ending 11/28/23 2153 Filed Weights   11/28/23 1752  Weight: 102.1 kg    Examination: General: *** HENT: *** Lungs: *** Cardiovascular: *** Abdomen: *** Extremities: *** Neuro: *** GU: ***  Resolved problem list   Assessment and Plan    Best Practice (right click and Reselect all SmartList Selections daily)   Diet/type: {diet type:25684} DVT prophylaxis {anticoagulation:25687} Pressure ulcer(s): {pressure ulcer(s):31683} GI prophylaxis: {HP:73065} Lines: {Central Venous Access:25771} Foley:  {Central Venous Access:25691} Code Status:  {Code Status:26939} Last date of multidisciplinary goals of care discussion [***]  Labs   CBC: Recent Labs  Lab 11/28/23 1753  WBC 8.3  HGB 16.4  HCT 50.7  MCV 97.7  PLT 237    Basic Metabolic Panel: Recent Labs  Lab 11/28/23 1753  NA 137  K 3.9  CL 94*  CO2 32  GLUCOSE 122*  BUN 31*  CREATININE 0.89  CALCIUM  9.2   GFR: Estimated Creatinine Clearance: 94.3 mL/min (by C-G formula based on SCr of 0.89 mg/dL). Recent Labs  Lab 11/28/23 1753  WBC 8.3   LATICACIDVEN 1.3    Liver Function Tests: Recent Labs  Lab 11/28/23 1753  AST 42*  ALT 68*  ALKPHOS 66  BILITOT 1.0  PROT 7.0  ALBUMIN 3.5   No results for input(s): LIPASE, AMYLASE in the last 168 hours. No results for input(s): AMMONIA in the last 168 hours.  ABG    Component Value Date/Time   PHART 7.45 10/02/2022 1540   PCO2ART 78 (HH) 10/02/2022 1540   PO2ART 69 (L) 10/02/2022 1540   HCO3 35.6 (H) 11/28/2023 1916   O2SAT PENDING 11/28/2023 1916     Coagulation Profile: No results for input(s): INR, PROTIME in the last 168 hours.  Cardiac Enzymes: No results for input(s): CKTOTAL, CKMB, CKMBINDEX, TROPONINI in the last 168 hours.  HbA1C: Hgb A1c MFr Bld  Date/Time Value Ref Range Status  09/28/2022 05:24 PM 5.4 4.8 - 5.6 % Final    Comment:    (NOTE) Pre diabetes:          5.7%-6.4%  Diabetes:              >6.4%  Glycemic control for   <7.0% adults with diabetes     CBG: No results for input(s): GLUCAP in the last 168 hours.  Review of Systems:   ***  Past Medical History:  He,  has a past medical history of Aneurysm (HCC), COPD (chronic obstructive pulmonary disease) (HCC), Coronary artery disease, Erythrocytosis (12/01/2018), Hyperlipidemia, Hypertension, and Myocardial infarction (HCC).   Surgical History:   Past Surgical History:  Procedure Laterality Date  . APPENDECTOMY    . COLONOSCOPY WITH PROPOFOL  N/A 11/20/2014   Procedure: COLONOSCOPY WITH PROPOFOL ;  Surgeon: Louanne KANDICE Muse, MD;  Location: [facility] ENDOSCOPY;  Service: Endoscopy;  Laterality: N/A;  . COLONOSCOPY WITH PROPOFOL  N/A 03/19/2022   Procedure: COLONOSCOPY WITH PROPOFOL ;  Surgeon: Unk Corinn Skiff, MD;  Location: [facility] ENDOSCOPY;  Service: Gastroenterology;  Laterality: N/A;  . TONSILLECTOMY AND ADENOIDECTOMY       Social History:   reports that he quit smoking about 13 months ago. His smoking use included cigarettes. He started smoking about 52  years ago. He has a 51 pack-year smoking history. He has never used smokeless tobacco. He reports current alcohol use. He reports that he does not use drugs.   Family History:  His family history is not on file.   Allergies No Known Allergies   Home Medications  Prior to Admission medications   Medication Sig Start Date End Date Taking? Authorizing Provider  aspirin  81 MG chewable tablet Chew 81 mg by mouth daily.    [provider]  atorvastatin  (LIPITOR) 20 MG tablet Take 1 tablet (20 mg total) by mouth daily. 07/26/23   Bernardo Fend, DO  budeson-glycopyrrolate -formoterol  (BREZTRI  AEROSPHERE) 160-9-4.8 MCG/ACT AERO inhaler Inhale 2 puffs into the lungs in the morning and at bedtime. 07/26/23   Bernardo Fend, DO  carvedilol  (COREG ) 12.5 MG tablet Take 1 tablet (12.5 mg total) by mouth 2 (two) times daily. 07/26/23 07/20/24  Bernardo Fend, DO  fluticasone  (FLONASE ) 50 MCG/ACT nasal spray Place 2 sprays into both nostrils daily. 05/21/22   Bernardo Fend, DO  folic acid  (FOLVITE ) 1 MG tablet Take 1 tablet (1 mg  total) by mouth daily. 10/06/22   Amin, Sumayya, MD  hydrochlorothiazide  (HYDRODIURIL ) 25 MG tablet Take 1 tablet (25 mg total) by mouth daily. 09/27/23   Bernardo Fend, DO  losartan  (COZAAR ) 50 MG tablet TAKE 1 TABLET BY MOUTH DAILY 09/13/23   Bernardo Fend, DO  Multiple Vitamin (MULTIVITAMIN WITH MINERALS) TABS tablet Take 1 tablet by mouth daily. 10/06/22   Caleen Qualia, MD  VENTOLIN  HFA 108 (90 Base) MCG/ACT inhaler USE 2 INHALATIONS BY MOUTH EVERY 6 HOURS AS NEEDED FOR WHEEZING  OR SHORTNESS OF BREATH 07/08/23   Bernardo Fend, DO     Critical care time: ***

## 2023-11-28 NOTE — ED Notes (Signed)
 Patient intubated, 7.5 ett 23 at the top lip, positive color change

## 2023-11-28 NOTE — H&P (Signed)
 History and Physical   [facility] - [place] @ [facility] Admission History and Physical [employer], D.O.    Patient Name: Joshua Schmidt MR#: [id number] Date of Birth: 1950/05/25 Date of Admission: 11/28/2023  Referring MD/NP/PA: Dr. Waymond Primary Care Physician: Bernardo Fend, DO  Chief Complaint:  Chief Complaint  Patient presents with   hypoxia   Altered Mental Status  Please note the entire history is obtained from the patient's emergency department chart, emergency department provider  Patient's personal history is limited by intubation   HPI: Joshua Schmidt is a 73 y.o. male with a known history of COPD on 4 L at baseline, CAD status post MI, hypertension, hyperlipidemia, chronic venous insufficiency, iron deficiency, obstructive sleep apnea, chronic diastolic congestive heart failure, DVT not on anticoagulation presents to the emergency department for evaluation of altered mental status and shortness of breath.  Patient was in a usual state of health until patient's wife noticed that he was not on his baseline 2 L of O2, wife suspects that patient pulled his oxygen  off during the night.  He is confused and altered, unable to perform ADLs.  In the emergency department he was started on 6 L O2 by nasal cannula and transitioned up to BiPAP.  He was ultimately intubated by EDP because of increased work of breathing and somnolence  Patient follows with [facility].  Last echo June 2024 with an EF of 60 to 65% mild LVH  EMS/ED Course: Patient received furosemide , methylprednisolone , DuoNeb. Medical admission has been requested for further management of acute on chronic hypoxic and hypercarbic respiratory failure secondary to acute exacerbation of COPD  Review of Systems:  Unable to obtain secondary to intubation  Past Medical History:  Diagnosis Date   Aneurysm (HCC)    COPD (chronic obstructive pulmonary disease) (HCC)    Coronary artery disease    Erythrocytosis  12/01/2018   Hyperlipidemia    Hypertension    Myocardial infarction [facility])     Past Surgical History:  Procedure Laterality Date   APPENDECTOMY     COLONOSCOPY WITH PROPOFOL  N/A 11/20/2014   Procedure: COLONOSCOPY WITH PROPOFOL ;  Surgeon: Louanne KANDICE Muse, MD;  Location: [facility] ENDOSCOPY;  Service: Endoscopy;  Laterality: N/A;   COLONOSCOPY WITH PROPOFOL  N/A 03/19/2022   Procedure: COLONOSCOPY WITH PROPOFOL ;  Surgeon: Unk Corinn Skiff, MD;  Location: [facility] ENDOSCOPY;  Service: Gastroenterology;  Laterality: N/A;   TONSILLECTOMY AND ADENOIDECTOMY       reports that he quit smoking about 13 months ago. His smoking use included cigarettes. He started smoking about 52 years ago. He has a 51 pack-year smoking history. He has never used smokeless tobacco. He reports current alcohol use. He reports that he does not use drugs.  No Known Allergies  History reviewed. No pertinent family history.  Prior to Admission medications   Medication Sig Start Date End Date Taking? Authorizing Provider  aspirin  81 MG chewable tablet Chew 81 mg by mouth daily.    [provider]  atorvastatin  (LIPITOR) 20 MG tablet Take 1 tablet (20 mg total) by mouth daily. 07/26/23   Bernardo Fend, DO  budeson-glycopyrrolate -formoterol  (BREZTRI  AEROSPHERE) 160-9-4.8 MCG/ACT AERO inhaler Inhale 2 puffs into the lungs in the morning and at bedtime. 07/26/23   Bernardo Fend, DO  carvedilol  (COREG ) 12.5 MG tablet Take 1 tablet (12.5 mg total) by mouth 2 (two) times daily. 07/26/23 07/20/24  Bernardo Fend, DO  fluticasone  (FLONASE ) 50 MCG/ACT nasal spray Place 2 sprays into both nostrils daily. 05/21/22  Bernardo Fend, DO  folic acid  (FOLVITE ) 1 MG tablet Take 1 tablet (1 mg total) by mouth daily. 10/06/22   Amin, Sumayya, MD  hydrochlorothiazide  (HYDRODIURIL ) 25 MG tablet Take 1 tablet (25 mg total) by mouth daily. 09/27/23   Bernardo Fend, DO  losartan  (COZAAR ) 50 MG tablet TAKE 1 TABLET BY MOUTH  DAILY 09/13/23   Bernardo Fend, DO  Multiple Vitamin (MULTIVITAMIN WITH MINERALS) TABS tablet Take 1 tablet by mouth daily. 10/06/22   Caleen Qualia, MD  VENTOLIN  HFA 108 (90 Base) MCG/ACT inhaler USE 2 INHALATIONS BY MOUTH EVERY 6 HOURS AS NEEDED FOR WHEEZING  OR SHORTNESS OF BREATH 07/08/23   Bernardo Fend, DO    Physical Exam: Vitals:   11/28/23 1747 11/28/23 1750 11/28/23 1751 11/28/23 1752  BP:  (!) 128/90    Pulse:  77    Resp:  20    Temp:   99.1 F (37.3 C)   TempSrc:   Oral   SpO2: (!) 74% 92%    Weight:    102.1 kg  Height:    6' 2 (1.88 m)    GENERAL: 73 y.o.-year-old white male patient,  HEENT: Head atraumatic, normocephalic. Pupils equal. Mucus membranes moist. NECK: Supple. No JVD. CHEST: Diminished breath sounds and scant wheezing throughout.  Intubated CARDIOVASCULAR: S1, S2 normal. No murmurs, rubs, or gallops. Cap refill <2 seconds. Pulses intact distally.  ABDOMEN: Soft, nondistended.  Normoactive bowel sounds x 4 EXTREMITIES: Bilateral lower extremity edema, nonpitting NEUROLOGIC: Unable to comply with exam secondary to intubation SKIN: Warm, dry, and intact without obvious rash, lesion, or ulcer.    Labs on Admission:  CBC: Recent Labs  Lab 11/28/23 1753  WBC 8.3  HGB 16.4  HCT 50.7  MCV 97.7  PLT 237   Basic Metabolic Panel: Recent Labs  Lab 11/28/23 1753  NA 137  K 3.9  CL 94*  CO2 32  GLUCOSE 122*  BUN 31*  CREATININE 0.89  CALCIUM  9.2   GFR: Estimated Creatinine Clearance: 94.3 mL/min (by C-G formula based on SCr of 0.89 mg/dL). Liver Function Tests: Recent Labs  Lab 11/28/23 1753  AST 42*  ALT 68*  ALKPHOS 66  BILITOT 1.0  PROT 7.0  ALBUMIN 3.5   No results for input(s): LIPASE, AMYLASE in the last 168 hours. No results for input(s): AMMONIA in the last 168 hours. Coagulation Profile: No results for input(s): INR, PROTIME in the last 168 hours. Cardiac Enzymes: No results for input(s): CKTOTAL,  CKMB, CKMBINDEX, TROPONINI in the last 168 hours. BNP (last 3 results) No results for input(s): PROBNP in the last 8760 hours. HbA1C: No results for input(s): HGBA1C in the last 72 hours. CBG: No results for input(s): GLUCAP in the last 168 hours. Lipid Profile: No results for input(s): CHOL, HDL, LDLCALC, TRIG, CHOLHDL, LDLDIRECT in the last 72 hours. Thyroid  Function Tests: No results for input(s): TSH, T4TOTAL, FREET4, T3FREE, THYROIDAB in the last 72 hours. Anemia Panel: No results for input(s): VITAMINB12, FOLATE, FERRITIN, TIBC, IRON, RETICCTPCT in the last 72 hours. Urine analysis:    Component Value Date/Time   BILIRUBINUR neg 09/06/2020 1012   PROTEINUR Negative 09/06/2020 1012   UROBILINOGEN negative (A) 09/06/2020 1012   NITRITE neg 09/06/2020 1012   LEUKOCYTESUR Negative 09/06/2020 1012   Sepsis Labs: @LABRCNTIP (procalcitonin:4,lacticidven:4) )No results found for this or any previous visit (from the past 240 hours).   Radiological Exams on Admission: US  Venous Img Lower Bilateral (DVT) Result Date: 11/28/2023 CLINICAL DATA:  Lower extremity swelling  EXAM: BILATERAL LOWER EXTREMITY VENOUS DOPPLER ULTRASOUND TECHNIQUE: Gray-scale sonography with compression, as well as color and duplex ultrasound, were performed to evaluate the deep venous system(s) from the level of the common femoral vein through the popliteal and proximal calf veins. COMPARISON:  None Available. FINDINGS: VENOUS Normal compressibility of the common femoral, superficial femoral, and popliteal veins, as well as the visualized calf veins. Visualized portions of profunda femoral vein and great saphenous vein unremarkable. No filling defects to suggest DVT on grayscale or color Doppler imaging. Doppler waveforms show normal direction of venous flow, normal respiratory plasticity and response to augmentation. OTHER None. Limitations: none IMPRESSION: Negative.  Electronically Signed   By: Franky Crease M.D.   On: 11/28/2023 19:08   DG Chest 1 View Result Date: 11/28/2023 CLINICAL DATA:  Hypoxia and altered mental status EXAM: CHEST  1 VIEW COMPARISON:  09/28/2022 FINDINGS: Cardiomegaly and pulmonary vascular congestion. Interstitial coarsening in the lower lungs may be due to edema or infection. No pleural effusion or pneumothorax. Aortic atherosclerotic calcification. IMPRESSION: Cardiomegaly and pulmonary vascular congestion. Interstitial coarsening in the lower lungs, favor edema. Electronically Signed   By: Norman Gatlin M.D.   On: 11/28/2023 18:55    EKG: Normal sinus rhythm at 74 bpm with normal axis and T WI in V2 through V5 nonspecific ST-T wave changes.   Assessment/Plan  This is a 73 y.o. male with a history of COPD on 2 L at baseline, CAD status post MI, hypertension, hyperlipidemia, chronic venous insufficiency, iron deficiency, obstructive sleep apnea, chronic diastolic congestive heart failure, DVT not on anticoagulation now being admitted with:  #. Acute on chronic hypoxic and hypercarbic respiratory failure secondary to exacerbation of COPD and CHF.  Patient is on 4L at baseline but was requiring 6 L nasal cannula, switched to BiPAP in the emergency department and was ultimately intubatedl.  BNP is elevated at 752.  No evidence of pneumonia -Admit inpatient, ICU -Continue BiPAP for hypercarbia - IV steroids and azithro - Nebulizers and expectorants as needed.  - Continuous pulse oximetry - Consider pulmonary consult if not improving.  - Continue Breztri , fluticasone  - Continue carvedilol , losartan   - Intake/output, daily weight. - Trend troponins, check lipids and TSH. - Check echo - Cardiology consultation requested.  #. History of CAD - Continue aspirin   #. History of HLD - Continue atorvastatin   #. History of HTN - Continue HCTZ  Admission status: Inpatient, telemetry IV Fluids: Hep-Lock Diet/Nutrition: N.p.o.   Consults called: Cardiology, critical care DVT Px: Lovenox , SCDs and early ambulation. Code Status: Full Code  Disposition Plan: To home in 3-4 days  All the records are reviewed and case discussed with ED provider. Management plans discussed with the patient and/or family who express understanding and agree with plan of care.  Akya Fiorello D.O. on 11/28/2023 at 7:42 PM CC: Primary care physician; Bernardo Fend, DO   11/28/2023, 7:42 PM

## 2023-11-28 NOTE — Progress Notes (Signed)
 Transported patient to CT and then to ICU on transport ventilator without incident.

## 2023-11-28 NOTE — ED Provider Notes (Addendum)
 [facility] Provider Note    Event Date/Time   First MD Initiated Contact with Patient 11/28/23 1800     (approximate)   History   hypoxia and Altered Mental Status   HPI  Joshua Schmidt is a 73 y.o. male with history of COPD on 2 L nasal cannula, prior history of DVT not on blood thinners, history of CHF, not on diuretics, presenting with altered mental status and hypoxia.  Patient denies any chest pain, he does endorse shortness of breath, he denies any cough or fever, no abdominal pain or nausea vomiting or diarrhea.  History from wife, patient was found not on his 2 L O2.  Thinks that he may have pulled it off at around 1 to 2 AM.  States that he is confused.  Typically able to care for himself and takes meds but did not do so today.  She states that his lower extremity swelling is not new.  She denies any new trauma or falls.  No head strike.  On independent review, he was seen by his primary care doctor in mid July, history of hypertension on losartan  and Coreg , history of chronic venous stasis, COPD.     Physical Exam   Triage Vital Signs: ED Triage Vitals  Encounter Vitals Group     BP 11/28/23 1750 (!) 128/90     Girls Systolic BP Percentile --      Girls Diastolic BP Percentile --      Boys Systolic BP Percentile --      Boys Diastolic BP Percentile --      Pulse Rate 11/28/23 1750 77     Resp 11/28/23 1750 20     Temp 11/28/23 1751 99.1 F (37.3 C)     Temp Source 11/28/23 1751 Oral     SpO2 11/28/23 1747 (!) 74 %     Weight 11/28/23 1752 225 lb (102.1 kg)     Height 11/28/23 1752 6' 2 (1.88 m)     Head Circumference --      Peak Flow --      Pain Score --      Pain Loc --      Pain Education --      Exclude from Growth Chart --     Most recent vital signs: Vitals:   11/28/23 2130 11/28/23 2140  BP: (!) 149/84   Pulse: 88 82  Resp: 19 (!) 35  Temp:    SpO2: (!) 72% (!) 84%     General: Awake, no distress.  CV:  Good  peripheral perfusion.  Resp:  Normal effort.  Crackles in the bases, he is tight in the upper airways with sparse wheezing, he has tachypnea but no increased work of breathing Abd:  No distention.  Soft nontender Other:  Bilateral lower extremity edema the appear symmetrical, he is moving all 4 extremities without focal weakness or numbness, no slurred speech, no facial droop, he is ANO x 2   ED Results / Procedures / Treatments   Labs (all labs ordered are listed, but only abnormal results are displayed) Labs Reviewed  COMPREHENSIVE METABOLIC PANEL WITH GFR - Abnormal; Notable for the following components:      Result Value   Chloride 94 (*)    Glucose, Bld 122 (*)    BUN 31 (*)    AST 42 (*)    ALT 68 (*)    All other components within normal limits  BRAIN NATRIURETIC PEPTIDE -  Abnormal; Notable for the following components:   B Natriuretic Peptide 752.8 (*)    All other components within normal limits  BLOOD GAS, VENOUS - Abnormal; Notable for the following components:   pCO2, Ven 74 (*)    pO2, Ven 59 (*)    Bicarbonate 35.6 (*)    Acid-Base Excess 6.3 (*)    All other components within normal limits  D-DIMER, QUANTITATIVE - Abnormal; Notable for the following components:   D-Dimer, Quant 2.39 (*)    All other components within normal limits  CBC  LACTIC ACID, PLASMA  URINALYSIS, ROUTINE W REFLEX MICROSCOPIC  BLOOD GAS, ARTERIAL  CBG MONITORING, ED  TROPONIN I (HIGH SENSITIVITY)  TROPONIN I (HIGH SENSITIVITY)     EKG  EKG shows, sinus rhythm, rate 74, normal QS, normal QTc, no obvious ischemic ST elevation, T wave version to V2, V3, V4, V5, T wave flattening in aVL, 1, T which changes are new compared to prior  RADIOLOGY On my independent interpretation, x-ray shows pulmonary edema.   PROCEDURES:  Critical Care performed: Yes, see critical care procedure note(s)  Procedures   MEDICATIONS ORDERED IN ED: Medications  methylPREDNISolone  sodium succinate  (SOLU-MEDROL ) 40 mg/mL injection 40 mg (has no administration in time range)  azithromycin  (ZITHROMAX ) 500 mg in sodium chloride  0.9 % 250 mL IVPB (has no administration in time range)  propofol  (DIPRIVAN ) 1000 MG/100ML infusion (has no administration in time range)  ipratropium-albuterol  (DUONEB) 0.5-2.5 (3) MG/3ML nebulizer solution 9 mL (9 mLs Nebulization Given 11/28/23 1839)  methylPREDNISolone  sodium succinate (SOLU-MEDROL ) 125 mg/2 mL injection 125 mg (125 mg Intravenous Given 11/28/23 1840)  furosemide  (LASIX ) injection 20 mg (20 mg Intravenous Given 11/28/23 1839)  iohexol  (OMNIPAQUE ) 350 MG/ML injection 100 mL (100 mLs Intravenous Contrast Given 11/28/23 2114)  etomidate  (AMIDATE ) injection 30 mg (30 mg Intravenous Given 11/28/23 2148)  rocuronium  (ZEMURON ) injection 100 mg (100 mg Intravenous Given 11/28/23 2149)     IMPRESSION / MDM / ASSESSMENT AND PLAN / ED COURSE  I reviewed the triage vital signs and the nursing notes.                              Differential diagnosis includes, but is not limited to, CHF exacerbation, COPD exacerbation, hypercarbia, hypoxemic respiratory failure, DVT, considered PE given his history of DVT but given the wheezing and crackles on exam, favor CHF or COPD exacerbation above PE at this time.  Will get a D-dimer, labs, EKG, troponin, chest x-ray, DVT ultrasound, VBG.  Patient satting 91 to 93% on 6 L nasal cannula, switching over to high flow nasal cannula but not plan to over oxygenate him.  DuoNebs, Solu-Medrol , Lasix .  He will need to be admitted for further management.  Patient's presentation is most consistent with acute presentation with potential threat to life or bodily function.  Independent interpretation of labs and imaging below.  Clinical course as below.  Given that he is on BiPAP, he will need to be admitted for further management of the CHF and COPD.  Consult to hospitalist was agreeable with plan for admission and will evaluate the  patient.  He is admitted.  D-dimer is elevated, added CT PE study.  Sent message to hospitalist to let them know.  9:57 PM: Was notified by charge that patient was becoming more altered on the BiPAP, persistently hypoxic.  When I evaluated him, he was more somnolent.  Given his hypoxia as well as  worsening mental status change, he required intubation for airway protection and persistent hypoxia.  Etomidate  and roc were used for intubation.  Fentanyl  and propofol  for post sedation.  Consulted ICU for admission and they will evaluate the patient.  I also notified hospitalist.  The patient is on the cardiac monitor to evaluate for evidence of arrhythmia and/or significant heart rate changes.   Clinical Course as of 11/28/23 2157  Sun Nov 28, 2023  1859 DG Chest 1 View IMPRESSION: Cardiomegaly and pulmonary vascular congestion. Interstitial coarsening in the lower lungs, favor edema.   [TT]  1917 US  Venous Img Lower Bilateral (DVT) Negative [TT]  1929 VBG shows elevated CO2, pH is 7.29, will switch him over to BiPAP given on reassessment his wheezing has cleared, but he still tachypneic with little bit of belly breathing. [TT]    Clinical Course User Index [TT] Waymond, Lorelle Cummins, MD     FINAL CLINICAL IMPRESSION(S) / ED DIAGNOSES   Final diagnoses:  Altered mental status, unspecified altered mental status type  Hypercarbic cerebral edema (HCC)  COPD exacerbation (HCC)  Acute on chronic congestive heart failure, unspecified heart failure type (HCC)     Rx / DC Orders   ED Discharge Orders     None        Note:  This document was prepared using Dragon voice recognition software and may include unintentional dictation errors.    Waymond Lorelle Cummins, MD 11/28/23 RETHA    Waymond Lorelle Cummins, MD 11/28/23 2157

## 2023-11-28 NOTE — Progress Notes (Signed)
 Pharmacy Antibiotic Note  Joshua Schmidt is a 73 y.o. male admitted on 11/28/2023 with aspiration PNA.  Pharmacy has been consulted for Unasyn  dosing.  Plan: Unasyn  3 gm IV Q6h to start 8/18 @ 0000.  Height: 6' (182.9 cm) Weight: 102.6 kg (226 lb 3.1 oz) IBW/kg (Calculated) : 77.6  Temp (24hrs), Avg:98.4 F (36.9 C), Min:96.2 F (35.7 C), Max:99.9 F (37.7 C)  Recent Labs  Lab 11/28/23 1753  WBC 8.3  CREATININE 0.89  LATICACIDVEN 1.3    Estimated Creatinine Clearance: 91.6 mL/min (by C-G formula based on SCr of 0.89 mg/dL).    No Known Allergies  Antimicrobials this admission:   >>    >>   Dose adjustments this admission:   Microbiology results:  BCx:   UCx:    Sputum:    MRSA PCR:   Thank you for allowing pharmacy to be a part of this patient's care.  Vidal Lampkins D 11/28/2023 11:59 PM

## 2023-11-28 NOTE — ED Triage Notes (Signed)
 Pt to ED with wife for hypoxia and AMS since this afternoon around 4pm. Pt had slept since last night. Pt was not wearing his normal 2L today for unknown amount of time. Pt hypoxic in triage: 74% on 2L, turned to 4L then 6L, still 83%, then placed on NRB at 15L, came up to 91%, placed back on6L.  PERRL. Confused in triage. 18# placed R forearm. 1 set cultures, blue top sent with labs.  Feet are swollen.

## 2023-11-28 NOTE — ED Notes (Addendum)
 EDP Tan at bedside, decision made to intubate.

## 2023-11-28 NOTE — H&P (Incomplete)
 NAME:  Joshua Schmidt, MRN:  [id number], DOB:  [birth year], LOS: 0 ADMISSION DATE:  11/28/2023, CONSULTATION DATE:  11/28/23 REFERRING MD:  Dr. Waymond, CHIEF COMPLAINT: Hypoxia and AMS    History of Present Illness:  73 yo M presenting to [facility] ED from home for evaluation of hypoxia and AMS since the afternoon of 11/28/23 around 4 pm.  History obtained per chart review and spouse telephone report as patient is intubated and sedated at this time. Patient was described by spouse to be in his normal state of health until Sat night 8/16. He had been sitting with her out on the porch with his O2 on and then was unable to walk into the house, falling to his knees having to crawl. He is normally independent of ADL's and handles his own medication.  Overnight his wife suspected he had removed his oxygen , and then didn't wake up until the afternoon. When he woke up he was walking normally but acting irrationally and was confused.  She denied any other symptoms including: cough, congestion, fevers/chills, urinary symptoms, abdominal pain/ nausea/ vomiting/ diarrhea. She reports that he consumes 3 beers nightly and has started smoking again. He has told her only 1 cigarette a day, but can't be sure.  ED course: Upon arrival patient hypoxic in triage at 74% on 2 L [place], this was increased to 4 L then 6 L but still at 83%. After adjustment to NRB 15 L the patient recovered to 91%. He was treated for suspected CHF exacerbation and AECOPD. His hypoxia improved after treatment, but appeared to have increased work of breathing and was placed on BIPAP support. Due to history of DVT without chronic anticoagulation- CT angio ordered. During transport to CT angio patient vomited into his BIPAP mask which was removed then for safety. Upon arrival back to his room, he was somnolent and hypoxic requiring intubation and mechanical ventilatory support. Imaging negative for PE but concerning for possible aspiration. Labs significant  for respiratory acidosis, elevated BNP suggestive of HFpEF exacerbation, mild Transaminitis and elevated troponin.  Medications given: Lasix , etomidate /rocuronium , solu-medrol , duo-neb with propofol  drip started Initial Vitals: 99.1, 20, 77, 128/90 & 74% on 2L [place] Significant labs: (Labs/ Imaging personally reviewed) I, Jenita Ruth Rust-Chester, AGACNP-BC, personally viewed and interpreted this ECG. EKG Interpretation: Date: 11/28/23, EKG Time: 17:54, Rate: 74, Rhythm: NSR, QRS Axis:  RAD,  Intervals: 1st degree HB, ST/T Wave abnormalities: T wave inversions V1-V5, Narrative Interpretation: NSR Chemistry: Na+: 137, K+: 3.9, BUN/Cr.: 31/ 0.89, Serum CO2/ AG: 32/ 11, AST/ALT: 42/ 68 Hematology: WBC: 8.3, Hgb: 16.4,  BNP: 752.8, Lactic/ PCT: 1.3/ pending  VBG: 7.29/ 74/ 59/ 35.6 >> ABG: 7.2/ 102/ 56/ 39.9 CXR 11/28/23: Cardiomegaly and pulmonary vascular congestion. Interstitial coarsening in the lower lungs, favor edema. CT angio chest PE 11/28/23:  Negative for pulmonary embolism. Peri-bronchovascular consolidation in the right lower lobe, left lingula, greatest in the left lower lobe. Findings may be due to atelectasis however aspiration or pneumonia could appear similarly. Acute or subacute appearing fracture of T10 with 25% vertebral body height loss and no retropulsion. Ascending aortic aneurysm measuring 47 mm in diameter. This is not substantially changed from 02/16/2023 using similar measuring technique. Aortic Atherosclerosis (ICD10-I70.0).  PCCM consulted for admission due to acute on chronic hypercapnic and hypoxic respiratory failure s/t suspected aspiration in the setting of HFpEF & AECOPD requiring intubation and mechanical ventilatory support.  Pertinent  Medical History  Stage 3 Severe COPD 2 L [place] Former smoker (09/2022)-  51 pack year history HFpEF DVT no on anticoagulation ETOH abuse Erythrocytosis followed by hematology PTSD  Thoracic aortic aneurysm f/b cardiology &  vascular Lung nodule HLD CAD s/p MI HTN OSA  Significant Hospital Events: Including procedures, antibiotic start and stop dates in addition to other pertinent events   11/28/23: Admit to ICU due to acute on chronic hypercapnic and hypoxic respiratory failure s/t suspected aspiration in the setting of HFpEF & AECOPD requiring intubation and mechanical ventilatory support.  Interim History / Subjective:  Patient intubated and sedated. Wife updated on clinical status. Plan of care discussed, all questions and concerns answered at this time.  Objective    Blood pressure (!) 149/84, pulse 82, temperature 99.1 F (37.3 C), temperature source Oral, resp. rate (!) 35, height 6' 2 (1.88 m), weight 102.1 kg, SpO2 (!) 84%.    FiO2 (%):  [100 %] 100 %  No intake or output data in the 24 hours ending 11/28/23 2153 Filed Weights   11/28/23 1752  Weight: 102.1 kg    Examination: General: Adult male, critically ill, lying in bed intubated & sedated requiring mechanical ventilation  NAD HEENT: MM pink/moist, reddened sclera, atraumatic, neck supple Neuro: RASS: -4, on sedation, unable to follow commands, PERRL +3, MAE CV: s1s2 RRR, NSR on monitor, no r/m/g Pulm: Regular, non labored on PRVC 100% & PEEP 10, breath sounds diminished throughout GI: soft, rounded, bs x 4 GU: foley in place with clear yellow urine Skin: scabbed abrasions on bilateral knees Extremities: warm/dry, pulses + 2 R/P, +3 edema noted BLE  Resolved problem list   Assessment and Plan  Acute on Chronic Hypoxic / Hypercapnic Respiratory Failure secondary to Aspiration in the setting of HFpEF & AECOPD PMHx: OSA, Severe COPD on chronic 2 L [place], smoker - Ventilator settings: PRVC  6 mL/kg, 100% FiO2, 10 PEEP, continue ventilator support & lung protective strategies - Wean PEEP & FiO2 as tolerated, maintain SpO2 > 88% - Head of bed elevated 30 degrees, VAP protocol in place - Plateau pressures less than 30 cm H20  -  Intermittent chest x-ray & ABG PRN - Daily WUA with SBT as tolerated  - Ensure adequate pulmonary hygiene  - F/u cultures, trend PCT - Continue Azithromycin , add Aspiration Pna coverage: unasyn  - Steroids initiated: solu-medrol  40 mg BID  - Budesonide  nebs BID, scheduled Duo nebs and bronchodilators PRN - PAD protocol in place: continue Fentanyl  IVP & Propofol  drip - provide tobacco cessation counseling when able  Acute on Chronic HFpEF exacerbation Elevated Troponin secondary to N-STEMI vs demand ischemia PMHx: HTN, HLD, CAD, DVT - Trend troponin  - Echocardiogram ordered - consider heparin drip per pharmacy consult depending on troponin trend - EKG in AM - Strict I/O's: alert provider if UOP < 0.5 mL/kg/hr - Daily BMP, replace electrolytes PRN - Daily weights to assess volume status - Diurese with the use of IV lasix  as renal function and hemodynamics allow - hold outpatient losartan  and carvedilol  while receiving continuous sedatives, consider IV PRN medications if SBP > 160  continuous cardiac monitoring - Consider cardiology consultation depending on work up above  Mild Transaminitis in the setting of ETOH ETOH use - Trend hepatic function - If trending up consider RUQ US /CT abdomen/pelvis - avoid hepatotoxic agents - librium  taper ordered - daily folic acid , thiamine , multi-vitamin - CIWA per protocol  Acute Encephalopathy suspect secondary to hypercapnia/ hypoxia Unable to rule out TIA/CVA events after discussion with spouse about inability to walk and subsequent confusion. -  STAT CTH - supportive care - consider MRI or further work up depending on neurological status and recovery  Best Practice (right click and Reselect all SmartList Selections daily)  Diet/type: NPO w/ meds via tube DVT prophylaxis LMWH Pressure ulcer(s): N/A GI prophylaxis: H2B Lines: N/A Foley:  Yes, and it is still needed Code Status:  full code Last date of multidisciplinary goals of  care discussion [11/28/23]  Labs   CBC: Recent Labs  Lab 11/28/23 1753  WBC 8.3  HGB 16.4  HCT 50.7  MCV 97.7  PLT 237    Basic Metabolic Panel: Recent Labs  Lab 11/28/23 1753  NA 137  K 3.9  CL 94*  CO2 32  GLUCOSE 122*  BUN 31*  CREATININE 0.89  CALCIUM  9.2   GFR: Estimated Creatinine Clearance: 94.3 mL/min (by C-G formula based on SCr of 0.89 mg/dL). Recent Labs  Lab 11/28/23 1753  WBC 8.3  LATICACIDVEN 1.3    Liver Function Tests: Recent Labs  Lab 11/28/23 1753  AST 42*  ALT 68*  ALKPHOS 66  BILITOT 1.0  PROT 7.0  ALBUMIN 3.5   No results for input(s): LIPASE, AMYLASE in the last 168 hours. No results for input(s): AMMONIA in the last 168 hours.  ABG    Component Value Date/Time   PHART 7.45 10/02/2022 1540   PCO2ART 78 (HH) 10/02/2022 1540   PO2ART 69 (L) 10/02/2022 1540   HCO3 35.6 (H) 11/28/2023 1916   O2SAT PENDING 11/28/2023 1916     Coagulation Profile: No results for input(s): INR, PROTIME in the last 168 hours.  Cardiac Enzymes: No results for input(s): CKTOTAL, CKMB, CKMBINDEX, TROPONINI in the last 168 hours.  HbA1C: Hgb A1c MFr Bld  Date/Time Value Ref Range Status  09/28/2022 05:24 PM 5.4 4.8 - 5.6 % Final    Comment:    (NOTE) Pre diabetes:          5.7%-6.4%  Diabetes:              >6.4%  Glycemic control for   <7.0% adults with diabetes     CBG: No results for input(s): GLUCAP in the last 168 hours.  Review of Systems:   UTA- patient intubated and sedated at this time  Past Medical History:  He,  has a past medical history of Aneurysm (HCC), COPD (chronic obstructive pulmonary disease) (HCC), Coronary artery disease, Erythrocytosis (12/01/2018), Hyperlipidemia, Hypertension, and Myocardial infarction (HCC).   Surgical History:   Past Surgical History:  Procedure Laterality Date   APPENDECTOMY     COLONOSCOPY WITH PROPOFOL  N/A 11/20/2014   Procedure: COLONOSCOPY WITH PROPOFOL ;  Surgeon:  Louanne KANDICE Muse, MD;  Location: [facility] ENDOSCOPY;  Service: Endoscopy;  Laterality: N/A;   COLONOSCOPY WITH PROPOFOL  N/A 03/19/2022   Procedure: COLONOSCOPY WITH PROPOFOL ;  Surgeon: Unk Corinn Skiff, MD;  Location: [facility] ENDOSCOPY;  Service: Gastroenterology;  Laterality: N/A;   TONSILLECTOMY AND ADENOIDECTOMY       Social History:   reports that he quit smoking about 13 months ago. His smoking use included cigarettes. He started smoking about 52 years ago. He has a 51 pack-year smoking history. He has never used smokeless tobacco. He reports current alcohol use. He reports that he does not use drugs.   Family History:  His family history is not on file.   Allergies No Known Allergies   Home Medications  Prior to Admission medications   Medication Sig Start Date End Date Taking? Authorizing Provider  aspirin  81 MG chewable tablet  Chew 81 mg by mouth daily.    [provider]  atorvastatin  (LIPITOR) 20 MG tablet Take 1 tablet (20 mg total) by mouth daily. 07/26/23   Bernardo Fend, DO  budeson-glycopyrrolate -formoterol  (BREZTRI  AEROSPHERE) 160-9-4.8 MCG/ACT AERO inhaler Inhale 2 puffs into the lungs in the morning and at bedtime. 07/26/23   Bernardo Fend, DO  carvedilol  (COREG ) 12.5 MG tablet Take 1 tablet (12.5 mg total) by mouth 2 (two) times daily. 07/26/23 07/20/24  Bernardo Fend, DO  fluticasone  (FLONASE ) 50 MCG/ACT nasal spray Place 2 sprays into both nostrils daily. 05/21/22   Bernardo Fend, DO  folic acid  (FOLVITE ) 1 MG tablet Take 1 tablet (1 mg total) by mouth daily. 10/06/22   Amin, Sumayya, MD  hydrochlorothiazide  (HYDRODIURIL ) 25 MG tablet Take 1 tablet (25 mg total) by mouth daily. 09/27/23   Bernardo Fend, DO  losartan  (COZAAR ) 50 MG tablet TAKE 1 TABLET BY MOUTH DAILY 09/13/23   Bernardo Fend, DO  Multiple Vitamin (MULTIVITAMIN WITH MINERALS) TABS tablet Take 1 tablet by mouth daily. 10/06/22   Caleen Qualia, MD  VENTOLIN  HFA 108 (90 Base)  MCG/ACT inhaler USE 2 INHALATIONS BY MOUTH EVERY 6 HOURS AS NEEDED FOR WHEEZING  OR SHORTNESS OF BREATH 07/08/23   Bernardo Fend, DO     Critical care time: 68 minutes     Jenita Ruth Rust-Chester, AGACNP-BC Acute Care Nurse Practitioner [facility]   [phone] / [phone] Please see Amion for details.

## 2023-11-28 NOTE — Progress Notes (Signed)
 eLink Physician-Brief Progress Note Patient Name: Joshua Schmidt DOB: 1950-09-28 MRN: [id number]   Date of Service  11/28/2023  HPI/Events of Note  Acute on chr hypercarbic resp failure , intubated Acute pulm edema/COPD exac R/o pneumonia , consolidation noted on CT but no fever/WBC  eICU Interventions  Repeat ABG pending Empiric Abx     Intervention Category Evaluation Type: New Patient Evaluation  Joshua Schmidt 11/28/2023, 11:42 PM

## 2023-11-29 ENCOUNTER — Inpatient Hospital Stay

## 2023-11-29 ENCOUNTER — Inpatient Hospital Stay (HOSPITAL_COMMUNITY)
Admit: 2023-11-29 | Discharge: 2023-11-29 | Disposition: A | Discharge status: Home / Self Care | Attending: Family Medicine

## 2023-11-29 DIAGNOSIS — G928 Other toxic encephalopathy: Secondary | ICD-10-CM

## 2023-11-29 DIAGNOSIS — J9621 Acute and chronic respiratory failure with hypoxia: Secondary | ICD-10-CM | POA: Diagnosis not present

## 2023-11-29 DIAGNOSIS — I712 Thoracic aortic aneurysm, without rupture, unspecified: Secondary | ICD-10-CM

## 2023-11-29 DIAGNOSIS — J441 Chronic obstructive pulmonary disease with (acute) exacerbation: Secondary | ICD-10-CM | POA: Diagnosis not present

## 2023-11-29 DIAGNOSIS — F1090 Alcohol use, unspecified, uncomplicated: Secondary | ICD-10-CM

## 2023-11-29 DIAGNOSIS — I5031 Acute diastolic (congestive) heart failure: Secondary | ICD-10-CM | POA: Diagnosis not present

## 2023-11-29 DIAGNOSIS — I5033 Acute on chronic diastolic (congestive) heart failure: Secondary | ICD-10-CM | POA: Diagnosis not present

## 2023-11-29 DIAGNOSIS — J9622 Acute and chronic respiratory failure with hypercapnia: Secondary | ICD-10-CM | POA: Diagnosis not present

## 2023-11-29 LAB — COMPREHENSIVE METABOLIC PANEL WITH GFR
ALT: 63 U/L — ABNORMAL HIGH (ref 0–44)
AST: 37 U/L (ref 15–41)
Albumin: 3 g/dL — ABNORMAL LOW (ref 3.5–5.0)
Alkaline Phosphatase: 56 U/L (ref 38–126)
Anion gap: 13 (ref 5–15)
BUN: 24 mg/dL — ABNORMAL HIGH (ref 8–23)
CO2: 29 mmol/L (ref 22–32)
Calcium: 8.7 mg/dL — ABNORMAL LOW (ref 8.9–10.3)
Chloride: 97 mmol/L — ABNORMAL LOW (ref 98–111)
Creatinine, Ser: 0.75 mg/dL (ref 0.61–1.24)
GFR, Estimated: >60 mL/min (ref >60)
Glucose, Bld: 143 mg/dL — ABNORMAL HIGH (ref 70–99)
Potassium: 4.1 mmol/L (ref 3.5–5.1)
Sodium: 139 mmol/L (ref 135–145)
Total Bilirubin: 0.8 mg/dL (ref 0.0–1.2)
Total Protein: 6.1 g/dL — ABNORMAL LOW (ref 6.5–8.1)

## 2023-11-29 LAB — PHOSPHORUS: Phosphorus: 3.5 mg/dL (ref 2.5–4.6)

## 2023-11-29 LAB — LIPID PANEL
Cholesterol: 97 mg/dL (ref 0–200)
HDL: 29 mg/dL — ABNORMAL LOW (ref >40)
LDL Cholesterol: 46 mg/dL (ref 0–99)
Total CHOL/HDL Ratio: 3.3 ratio
Triglycerides: 109 mg/dL (ref <150)
VLDL: 22 mg/dL (ref 0–40)

## 2023-11-29 LAB — BASIC METABOLIC PANEL WITH GFR
Anion gap: 10 (ref 5–15)
BUN: 26 mg/dL — ABNORMAL HIGH (ref 8–23)
CO2: 32 mmol/L (ref 22–32)
Calcium: 8.5 mg/dL — ABNORMAL LOW (ref 8.9–10.3)
Chloride: 93 mmol/L — ABNORMAL LOW (ref 98–111)
Creatinine, Ser: 0.8 mg/dL (ref 0.61–1.24)
GFR, Estimated: >60 mL/min (ref >60)
Glucose, Bld: 147 mg/dL — ABNORMAL HIGH (ref 70–99)
Potassium: 3.8 mmol/L (ref 3.5–5.1)
Sodium: 135 mmol/L (ref 135–145)

## 2023-11-29 LAB — GLUCOSE, CAPILLARY
Glucose-Capillary: 138 mg/dL — ABNORMAL HIGH (ref 70–99)
Glucose-Capillary: 151 mg/dL — ABNORMAL HIGH (ref 70–99)
Glucose-Capillary: 128 mg/dL — ABNORMAL HIGH (ref 70–99)
Glucose-Capillary: 118 mg/dL — ABNORMAL HIGH (ref 70–99)
Glucose-Capillary: 130 mg/dL — ABNORMAL HIGH (ref 70–99)
Glucose-Capillary: 136 mg/dL — ABNORMAL HIGH (ref 70–99)

## 2023-11-29 LAB — ECHOCARDIOGRAM COMPLETE
AR max vel: 3.12 cm2
AV Area VTI: 3.1 cm2
AV Area mean vel: 2.86 cm2
AV Mean grad: 3 mmHg
AV Peak grad: 5 mmHg
Ao pk vel: 1.12 m/s
Area-P 1/2: 2.29 cm2
Height: 72 in
MV VTI: 1.87 cm2
S' Lateral: 3.2 cm
Weight: 3619.07 [oz_av]

## 2023-11-29 LAB — PROCALCITONIN: Procalcitonin: <0.1 ng/mL

## 2023-11-29 LAB — TSH: TSH: 0.292 u[IU]/mL — ABNORMAL LOW (ref 0.350–4.500)

## 2023-11-29 LAB — BLOOD GAS, ARTERIAL
Acid-Base Excess: 11 mmol/L — ABNORMAL HIGH (ref 0.0–2.0)
Bicarbonate: 38.4 mmol/L — ABNORMAL HIGH (ref 20.0–28.0)
FIO2: 70 %
MECHVT: 500 mL
Mechanical Rate: 20
O2 Saturation: 97.3 %
PEEP: 10 cmH2O
Patient temperature: 37
pCO2 arterial: 62 mmHg — ABNORMAL HIGH (ref 32–48)
pH, Arterial: 7.4 (ref 7.35–7.45)
pO2, Arterial: 76 mmHg — ABNORMAL LOW (ref 83–108)

## 2023-11-29 LAB — MAGNESIUM
Magnesium: 2.1 mg/dL (ref 1.7–2.4)
Magnesium: 2.3 mg/dL (ref 1.7–2.4)
Magnesium: 2 mg/dL (ref 1.7–2.4)

## 2023-11-29 LAB — CBC
HCT: 50.4 % (ref 39.0–52.0)
Hemoglobin: 16.3 g/dL (ref 13.0–17.0)
MCH: 31.3 pg (ref 26.0–34.0)
MCHC: 32.3 g/dL (ref 30.0–36.0)
MCV: 96.7 fL (ref 80.0–100.0)
Platelets: 198 K/uL (ref 150–400)
RBC: 5.21 MIL/uL (ref 4.22–5.81)
RDW: 14.3 % (ref 11.5–15.5)
WBC: 8.7 K/uL (ref 4.0–10.5)
nRBC: 0.3 % — ABNORMAL HIGH (ref 0.0–0.2)

## 2023-11-29 LAB — BLOOD GAS, VENOUS
Acid-Base Excess: 6.3 mmol/L — ABNORMAL HIGH (ref 0.0–2.0)
Bicarbonate: 35.6 mmol/L — ABNORMAL HIGH (ref 20.0–28.0)
Patient temperature: 37
Patient temperature: 37
pCO2, Ven: 74 mmHg (ref 44–60)
pH, Ven: 7.29 (ref 7.25–7.43)
pO2, Ven: 59 mmHg — ABNORMAL HIGH (ref 32–45)

## 2023-11-29 LAB — T4, FREE: Free T4: 0.79 ng/dL (ref 0.61–1.12)

## 2023-11-29 LAB — MRSA NEXT GEN BY PCR, NASAL: MRSA by PCR Next Gen: NOT DETECTED

## 2023-11-29 LAB — TRIGLYCERIDES: Triglycerides: 91 mg/dL (ref <150)

## 2023-11-29 LAB — TROPONIN I (HIGH SENSITIVITY)
Troponin I (High Sensitivity): 159 ng/L (ref <18)
Troponin I (High Sensitivity): 106 ng/L (ref <18)

## 2023-11-29 MED ORDER — PANTOPRAZOLE SODIUM 40 MG IV SOLR
40.0000 mg | Freq: Every day | INTRAVENOUS | Status: DC
  Administered 2023-11-29: 40 mg via INTRAVENOUS
  Filled 2023-11-29: qty 10

## 2023-11-29 MED ORDER — FAMOTIDINE 20 MG PO TABS
20.0000 mg | ORAL_TABLET | Freq: Two times a day (BID) | ORAL | Status: DC
  Administered 2023-11-29 (×2): 20 mg
  Filled 2023-11-29: qty 1

## 2023-11-29 MED ORDER — CHLORDIAZEPOXIDE HCL 5 MG PO CAPS: 25.0000 mg | ORAL_CAPSULE | Freq: Three times a day (TID) | ORAL | Status: DC

## 2023-11-29 MED ORDER — VITAL HP 1.0 CAL PO LIQD
1000.0000 mL | ORAL | Status: DC
  Administered 2023-11-29: 1000 mL

## 2023-11-29 MED ORDER — CHLORDIAZEPOXIDE HCL 5 MG PO CAPS
25.0000 mg | ORAL_CAPSULE | Freq: Four times a day (QID) | ORAL | Status: DC | PRN
  Administered 2023-11-29: 25 mg
  Filled 2023-11-29: qty 5

## 2023-11-29 MED ORDER — THIAMINE HCL 100 MG/ML IJ SOLN: 100.0000 mg | INTRAMUSCULAR | Status: DC

## 2023-11-29 MED ORDER — CHLORDIAZEPOXIDE HCL 5 MG PO CAPS: 25.0000 mg | ORAL_CAPSULE | Freq: Every day | ORAL | Status: DC

## 2023-11-29 MED ORDER — THIAMINE HCL 100 MG/ML IJ SOLN
500.0000 mg | Freq: Three times a day (TID) | INTRAVENOUS | Status: AC
  Administered 2023-11-29 – 2023-12-02 (×9): 500 mg via INTRAVENOUS
  Filled 2023-11-29 (×9): qty 5

## 2023-11-29 MED ORDER — THIAMINE HCL 100 MG/ML IJ SOLN
250.0000 mg | INTRAVENOUS | Status: AC
  Administered 2023-12-03 – 2023-12-04 (×2): 250 mg via INTRAVENOUS
  Filled 2023-11-29 (×2): qty 2.5

## 2023-11-29 MED ORDER — ADULT MULTIVITAMIN W/MINERALS CH
1.0000 | ORAL_TABLET | Freq: Every day | ORAL | Status: DC
  Administered 2023-11-29 – 2023-11-30 (×2): 1
  Filled 2023-11-29 (×2): qty 1

## 2023-11-29 MED ORDER — CHLORDIAZEPOXIDE HCL 5 MG PO CAPS
25.0000 mg | ORAL_CAPSULE | Freq: Four times a day (QID) | ORAL | Status: DC
  Administered 2023-11-29 (×3): 25 mg
  Filled 2023-11-29 (×3): qty 5

## 2023-11-29 MED ORDER — FREE WATER
30.0000 mL | Status: DC
  Administered 2023-11-29 – 2023-11-30 (×6): 30 mL

## 2023-11-29 MED ORDER — CHLORDIAZEPOXIDE HCL 5 MG PO CAPS: 25.0000 mg | ORAL_CAPSULE | ORAL | Status: DC

## 2023-11-29 MED ORDER — THIAMINE MONONITRATE 100 MG PO TABS: 100.0000 mg | ORAL_TABLET | Freq: Every day | ORAL | Status: DC

## 2023-11-29 NOTE — Plan of Care (Signed)
  Problem: Activity: Goal: Ability to tolerate increased activity will improve Outcome: Progressing   Problem: Respiratory: Goal: Ability to maintain a clear airway and adequate ventilation will improve Outcome: Progressing   Problem: Role Relationship: Goal: Method of communication will improve Outcome: Progressing   Problem: Education: Goal: Knowledge of General Education information will improve Description: Including pain rating scale, medication(s)/side effects and non-pharmacologic comfort measures Outcome: Progressing   Problem: Health Behavior/Discharge Planning: Goal: Ability to manage health-related needs will improve Outcome: Progressing   Problem: Clinical Measurements: Goal: Ability to maintain clinical measurements within normal limits will improve Outcome: Progressing Goal: Will remain free from infection Outcome: Progressing Goal: Diagnostic test results will improve Outcome: Progressing Goal: Respiratory complications will improve Outcome: Progressing Goal: Cardiovascular complication will be avoided Outcome: Progressing   Problem: Activity: Goal: Risk for activity intolerance will decrease Outcome: Progressing   Problem: Nutrition: Goal: Adequate nutrition will be maintained Outcome: Progressing   Problem: Coping: Goal: Level of anxiety will decrease Outcome: Progressing   Problem: Elimination: Goal: Will not experience complications related to bowel motility Outcome: Progressing Goal: Will not experience complications related to urinary retention Outcome: Progressing   Problem: Pain Managment: Goal: General experience of comfort will improve and/or be controlled Outcome: Progressing   Problem: Safety: Goal: Ability to remain free from injury will improve Outcome: Progressing   Problem: Skin Integrity: Goal: Risk for impaired skin integrity will decrease Outcome: Progressing

## 2023-11-29 NOTE — H&P (Incomplete)
 History and Physical   [facility] [place] @ [facility] Admission History and Physical [employer], D.O.    Patient Name: Joshua Schmidt MR#: [id number] Date of Birth: May 09, 1950 Date of Admission: 11/28/2023  Referring MD/NP/PA: Dr. Waymond Primary Care Physician: Bernardo Fend, DO  Chief Complaint:  Chief Complaint  Patient presents with  . hypoxia  . Altered Mental Status    HPI: Joshua Schmidt is a 73 y.o. male with a known history of COPD on 2 L at baseline, CAD status post MI, hypertension, hyperlipidemia, chronic venous insufficiency, iron deficiency, obstructive sleep apnea, chronic diastolic congestive heart failure, DVT not on anticoagulation presents to the emergency department for evaluation of altered mental status and shortness of breath.  Patient was in a usual state of health until patient's wife noticed that he was not on his baseline 2 L of O2, wife suspects that patient pulled his oxygen  off during the night.  He is confused and altered, unable to perform ADLs.  Patient follows with [facility]  Last echo June 2024 with an EF of 60 to 65% mild LVH  Patient denies fevers/chills, weakness, dizziness, chest pain, shortness of breath, N/V/C/D, abdominal pain, dysuria/frequency, changes in mental status.    Otherwise there has been no change in status. Patient has been taking medication as prescribed and there has been no recent change in medication or diet.  No recent antibiotics.  There has been no recent illness, hospitalizations, travel or sick contacts.    EMS/ED Course: Patient received furosemide , methylprednisolone , DuoNeb. Medical admission has been requested for further management of acute on chronic hypoxic and hypercarbic respiratory failure secondary to acute exacerbation of COPD  Review of Systems:  CONSTITUTIONAL: No fever/chills, fatigue, weakness, weight gain/loss, headache. EYES: No blurry or double vision. ENT: No tinnitus, postnasal drip,  redness or soreness of the oropharynx. RESPIRATORY: No cough, dyspnea, wheeze.  No hemoptysis.  CARDIOVASCULAR: No chest pain, palpitations, syncope, orthopnea. No lower extremity edema.  GASTROINTESTINAL: No nausea, vomiting, abdominal pain, diarrhea, constipation.  No hematemesis, melena or hematochezia. GENITOURINARY: No dysuria, frequency, hematuria. ENDOCRINE: No polyuria or nocturia. No heat or cold intolerance. HEMATOLOGY: No anemia, bruising, bleeding. INTEGUMENTARY: No rashes, ulcers, lesions. MUSCULOSKELETAL: No arthritis, gout. NEUROLOGIC: No numbness, tingling, ataxia, seizure-type activity, weakness. PSYCHIATRIC: No anxiety, depression, insomnia.   Past Medical History:  Diagnosis Date  . Aneurysm (HCC)   . COPD (chronic obstructive pulmonary disease) (HCC)   . Coronary artery disease   . Erythrocytosis 12/01/2018  . Hyperlipidemia   . Hypertension   . Myocardial infarction [facility])     Past Surgical History:  Procedure Laterality Date  . APPENDECTOMY    . COLONOSCOPY WITH PROPOFOL  N/A 11/20/2014   Procedure: COLONOSCOPY WITH PROPOFOL ;  Surgeon: Louanne KANDICE Muse, MD;  Location: [facility] ENDOSCOPY;  Service: Endoscopy;  Laterality: N/A;  . COLONOSCOPY WITH PROPOFOL  N/A 03/19/2022   Procedure: COLONOSCOPY WITH PROPOFOL ;  Surgeon: Unk Corinn Skiff, MD;  Location: [facility] ENDOSCOPY;  Service: Gastroenterology;  Laterality: N/A;  . TONSILLECTOMY AND ADENOIDECTOMY       reports that he quit smoking about 13 months ago. His smoking use included cigarettes. He started smoking about 52 years ago. He has a 51 pack-year smoking history. He has never used smokeless tobacco. He reports current alcohol use. He reports that he does not use drugs.  No Known Allergies  History reviewed. No pertinent family history.  Prior to Admission medications   Medication Sig Start Date End Date Taking? Authorizing Provider  aspirin  81 MG chewable tablet Chew 81 mg by mouth daily.    [provider]  atorvastatin  (LIPITOR) 20 MG tablet Take 1 tablet (20 mg total) by mouth daily. 07/26/23   Bernardo Fend, DO  budeson-glycopyrrolate -formoterol  (BREZTRI  AEROSPHERE) 160-9-4.8 MCG/ACT AERO inhaler Inhale 2 puffs into the lungs in the morning and at bedtime. 07/26/23   Bernardo Fend, DO  carvedilol  (COREG ) 12.5 MG tablet Take 1 tablet (12.5 mg total) by mouth 2 (two) times daily. 07/26/23 07/20/24  Bernardo Fend, DO  fluticasone  (FLONASE ) 50 MCG/ACT nasal spray Place 2 sprays into both nostrils daily. 05/21/22   Bernardo Fend, DO  folic acid  (FOLVITE ) 1 MG tablet Take 1 tablet (1 mg total) by mouth daily. 10/06/22   Amin, Sumayya, MD  hydrochlorothiazide  (HYDRODIURIL ) 25 MG tablet Take 1 tablet (25 mg total) by mouth daily. 09/27/23   Bernardo Fend, DO  losartan  (COZAAR ) 50 MG tablet TAKE 1 TABLET BY MOUTH DAILY 09/13/23   Bernardo Fend, DO  Multiple Vitamin (MULTIVITAMIN WITH MINERALS) TABS tablet Take 1 tablet by mouth daily. 10/06/22   Caleen Qualia, MD  VENTOLIN  HFA 108 (90 Base) MCG/ACT inhaler USE 2 INHALATIONS BY MOUTH EVERY 6 HOURS AS NEEDED FOR WHEEZING  OR SHORTNESS OF BREATH 07/08/23   Bernardo Fend, DO    Physical Exam: Vitals:   11/28/23 1747 11/28/23 1750 11/28/23 1751 11/28/23 1752  BP:  (!) 128/90    Pulse:  77    Resp:  20    Temp:   99.1 F (37.3 C)   TempSrc:   Oral   SpO2: (!) 74% 92%    Weight:    102.1 kg  Height:    6' 2 (1.88 m)    GENERAL: 73 y.o.-year-old white male patient, well-developed, well-nourished lying in the bed in no acute distress.  Pleasant and cooperative.   HEENT: Head atraumatic, normocephalic. Pupils equal. Mucus membranes moist. NECK: Supple. No JVD. CHEST: Normal breath sounds bilaterally. No wheezing, rales, rhonchi or crackles. No use of accessory muscles of respiration.  No reproducible chest wall tenderness.  CARDIOVASCULAR: S1, S2 normal. No murmurs, rubs, or gallops. Cap refill <2 seconds. Pulses  intact distally.  ABDOMEN: Soft, nondistended, nontender. No rebound, guarding, rigidity. Normoactive bowel sounds present in all four quadrants.  EXTREMITIES: No pedal edema, cyanosis, or clubbing. No calf tenderness or Homan's sign.  NEUROLOGIC: The patient is alert and oriented x 3. Cranial nerves II through XII are grossly intact with no focal sensorimotor deficit. PSYCHIATRIC:  Normal affect, mood, thought content. SKIN: Warm, dry, and intact without obvious rash, lesion, or ulcer.    Labs on Admission:  CBC: Recent Labs  Lab 11/28/23 1753  WBC 8.3  HGB 16.4  HCT 50.7  MCV 97.7  PLT 237   Basic Metabolic Panel: Recent Labs  Lab 11/28/23 1753  NA 137  K 3.9  CL 94*  CO2 32  GLUCOSE 122*  BUN 31*  CREATININE 0.89  CALCIUM  9.2   GFR: Estimated Creatinine Clearance: 94.3 mL/min (by C-G formula based on SCr of 0.89 mg/dL). Liver Function Tests: Recent Labs  Lab 11/28/23 1753  AST 42*  ALT 68*  ALKPHOS 66  BILITOT 1.0  PROT 7.0  ALBUMIN 3.5   No results for input(s): LIPASE, AMYLASE in the last 168 hours. No results for input(s): AMMONIA in the last 168 hours. Coagulation Profile: No results for input(s): INR, PROTIME in the last 168 hours. Cardiac Enzymes: No results for input(s): CKTOTAL, CKMB, CKMBINDEX, TROPONINI  in the last 168 hours. BNP (last 3 results) No results for input(s): PROBNP in the last 8760 hours. HbA1C: No results for input(s): HGBA1C in the last 72 hours. CBG: No results for input(s): GLUCAP in the last 168 hours. Lipid Profile: No results for input(s): CHOL, HDL, LDLCALC, TRIG, CHOLHDL, LDLDIRECT in the last 72 hours. Thyroid  Function Tests: No results for input(s): TSH, T4TOTAL, FREET4, T3FREE, THYROIDAB in the last 72 hours. Anemia Panel: No results for input(s): VITAMINB12, FOLATE, FERRITIN, TIBC, IRON, RETICCTPCT in the last 72 hours. Urine analysis:    Component Value  Date/Time   BILIRUBINUR neg 09/06/2020 1012   PROTEINUR Negative 09/06/2020 1012   UROBILINOGEN negative (A) 09/06/2020 1012   NITRITE neg 09/06/2020 1012   LEUKOCYTESUR Negative 09/06/2020 1012   Sepsis Labs: @LABRCNTIP (procalcitonin:4,lacticidven:4) )No results found for this or any previous visit (from the past 240 hours).   Radiological Exams on Admission: US  Venous Img Lower Bilateral (DVT) Result Date: 11/28/2023 CLINICAL DATA:  Lower extremity swelling EXAM: BILATERAL LOWER EXTREMITY VENOUS DOPPLER ULTRASOUND TECHNIQUE: Gray-scale sonography with compression, as well as color and duplex ultrasound, were performed to evaluate the deep venous system(s) from the level of the common femoral vein through the popliteal and proximal calf veins. COMPARISON:  None Available. FINDINGS: VENOUS Normal compressibility of the common femoral, superficial femoral, and popliteal veins, as well as the visualized calf veins. Visualized portions of profunda femoral vein and great saphenous vein unremarkable. No filling defects to suggest DVT on grayscale or color Doppler imaging. Doppler waveforms show normal direction of venous flow, normal respiratory plasticity and response to augmentation. OTHER None. Limitations: none IMPRESSION: Negative. Electronically Signed   By: Franky Crease M.D.   On: 11/28/2023 19:08   DG Chest 1 View Result Date: 11/28/2023 CLINICAL DATA:  Hypoxia and altered mental status EXAM: CHEST  1 VIEW COMPARISON:  09/28/2022 FINDINGS: Cardiomegaly and pulmonary vascular congestion. Interstitial coarsening in the lower lungs may be due to edema or infection. No pleural effusion or pneumothorax. Aortic atherosclerotic calcification. IMPRESSION: Cardiomegaly and pulmonary vascular congestion. Interstitial coarsening in the lower lungs, favor edema. Electronically Signed   By: Norman Gatlin M.D.   On: 11/28/2023 18:55    EKG: Normal sinus rhythm at 74 bpm with normal axis and T WI in V2  through V5 nonspecific ST-T wave changes.   Assessment/Plan  This is a 73 y.o. male with a history of COPD on 2 L at baseline, CAD status post MI, hypertension, hyperlipidemia, chronic venous insufficiency, iron deficiency, obstructive sleep apnea, chronic diastolic congestive heart failure, DVT not on anticoagulation now being admitted with:  #. Acute on chronic hypoxic and hypercarbic respiratory failure secondary to exacerbation of COPD and CHF.  Patient is on 2 L at baseline but was requiring 6 L nasal cannula, switched to BiPAP in the emergency department and has been doing well.  BNP is elevated at 752 -Admit inpatient, telemetry -Continue BiPAP for hypercarbia - IV steroids and azithro - Nebulizers and expectorants as needed.  - Continuous pulse oximetry - Consider pulmonary consult if not improving.  - Continue Breztri , fluticasone  - Continue carvedilol , losartan   - Intake/output, daily weight. - Trend troponins, check lipids and TSH. - Check echo - Cardiology consultation requested.  #. History of CAD - Continue aspirin   #. History of HLD - Continue atorvastatin   #. History of HTN - Continue HCTZ  Admission status: Inpatient, telemetry IV Fluids: Hep-Lock Diet/Nutrition: Heart healthy Consults called: Cardiology DVT Px: Lovenox , SCDs and early  ambulation. Code Status: Full Code  Disposition Plan: To home in 1-2 days  All the records are reviewed and case discussed with ED provider. Management plans discussed with the patient and/or family who express understanding and agree with plan of care.  Joshua Schmidt D.O. on 11/28/2023 at 7:42 PM CC: Primary care physician; Bernardo Fend, DO   11/28/2023, 7:42 PM

## 2023-11-29 NOTE — Plan of Care (Signed)
  Problem: Respiratory: Goal: Ability to maintain a clear airway and adequate ventilation will improve Outcome: Progressing   Problem: Clinical Measurements: Goal: Ability to maintain clinical measurements within normal limits will improve Outcome: Progressing Goal: Respiratory complications will improve Outcome: Progressing Goal: Cardiovascular complication will be avoided Outcome: Progressing   Problem: Elimination: Goal: Will not experience complications related to bowel motility Outcome: Progressing Goal: Will not experience complications related to urinary retention Outcome: Progressing   Problem: Pain Managment: Goal: General experience of comfort will improve and/or be controlled Outcome: Progressing

## 2023-11-29 NOTE — Progress Notes (Signed)
 Initial Nutrition Assessment  DOCUMENTATION CODES:   Not applicable  INTERVENTION:   Vital HP @60ml /hr- Initiate at 5ml/hr and increase by 10ml/hr q 8 hours until goal rate is reached.   Free water  flushes 30ml q4 hours to maintain tube patency   Propofol : 24.5 ml/hr- provides 647kcal/day   Regimen provides 2087kcal/day, 126g/day protein and 1367ml/day of free water .   Pt at high refeed risk; recommend monitor potassium, magnesium and phosphorus labs daily until stable  Daily weights   NUTRITION DIAGNOSIS:   Inadequate oral intake related to inability to eat (pt sedated and ventilated) as evidenced by NPO status.  GOAL:   Provide needs based on ASPEN/SCCM guidelines  MONITOR:   Vent status, Labs, Weight trends, TF tolerance, Skin, I & O's  REASON FOR ASSESSMENT:   Ventilator    ASSESSMENT:   73 y/o male with h/o etoh abuse, IDA, aortic/iliac aneurysms, OSA, DVT, CHF, MI, HLD, HTN, COPD, CAD, lung nodule and CHF who is admitted with COPD and CHF exacerbation.  Pt sedated and ventilated. OGT in place but needs advancement; RN aware. Plan is to initiate tube feeds today. Pt is at high refeed risk. Pt is receiving high dose IV thiamine . Per chart, pt appears to be at his UBW however, pt with significant edema in his BLE so likely did have weight loss pta. Pt is at high risk for developing malnutrition.    Medications reviewed and include: colace, lovenox , pepcid , folic acid , solu-medrol , MVI, miralax , thiamine , unasyn , azithromycin , propofol    Labs reviewed: K 4.1 wnl, BUN 24(H), P 3.5 wnl, Mg 2.3 wnl BNP- 752.8(H)- 8/17 Cbgs- 118, 128, 151 x 24 hrs  AIC 5.4- 09/2022  Patient is currently intubated on ventilator support MV: 9.8 L/min Temp (24hrs), Avg:99.2 F (37.3 C), Min:96.2 F (35.7 C), Max:100 F (37.8 C)  Propofol : 24.5 ml/hr- provides 647kcal/day   MAP >10mmHg   UOP-   NUTRITION - FOCUSED PHYSICAL EXAM:  Flowsheet Row Most Recent Value   Orbital Region No depletion  Upper Arm Region Moderate depletion  Thoracic and Lumbar Region Mild depletion  Buccal Region No depletion  Temple Region No depletion  Clavicle Bone Region Moderate depletion  Clavicle and Acromion Bone Region Moderate depletion  Scapular Bone Region No depletion  Dorsal Hand Unable to assess  Patellar Region Moderate depletion  Anterior Thigh Region Unable to assess  Posterior Calf Region Unable to assess  Edema (RD Assessment) Moderate  [BLE]  Hair Reviewed  Eyes Reviewed  Mouth Reviewed  Skin Reviewed  Nails Reviewed   Diet Order:   Diet Order     None      EDUCATION NEEDS:   No education needs have been identified at this time  Skin:  Skin Assessment: Reviewed RN Assessment (ecchymosis)  Last BM:  pta  Height:   Ht Readings from Last 1 Encounters:  11/28/23 6' (1.829 m)    Weight:   Wt Readings from Last 1 Encounters:  11/29/23 102.6 kg    Ideal Body Weight:  80.9 kg  BMI:  Body mass index is 30.68 kg/m.  Estimated Nutritional Needs:   Kcal:  2068kcal/day  Protein:  120-135g/day  Fluid:  2.0L/day  Augustin Shams MS, RD, LDN If unable to be reached, please send secure chat to RD inpatient available from 8:00a-4:00p daily

## 2023-11-29 NOTE — Progress Notes (Signed)
*  PRELIMINARY RESULTS* Echocardiogram 2D Echocardiogram has been performed.  Joshua Schmidt 11/29/2023, 2:27 PM

## 2023-11-29 NOTE — Progress Notes (Signed)
 NAME:  Joshua Schmidt, MRN:  [id number], DOB:  1950/07/23, LOS: 1 ADMISSION DATE:  11/28/2023,  CHIEF COMPLAINT:  Respiratory Failure   History of Present Illness:   73 yo M presenting to [facility] ED from home for evaluation of hypoxia and AMS since the afternoon of 11/28/23 around 4 pm.   History obtained per chart review and spouse telephone report as patient is intubated and sedated at this time. Patient was described by spouse to be in his normal state of health until Sat night 8/16. He had been sitting with her out on the porch with his O2 on and then was unable to walk into the house, falling to his knees having to crawl. He is normally independent of ADL's and handles his own medication.   Overnight his wife suspected he had removed his oxygen , and then didn't wake up until the afternoon. When he woke up he was walking normally but acting irrationally and was confused.  She denied any other symptoms including: cough, congestion, fevers/chills, urinary symptoms, abdominal pain/ nausea/ vomiting/ diarrhea. She reports that he consumes 3 beers nightly and has started smoking again. He has told her only 1 cigarette a day, but can't be sure.   ED course: Upon arrival patient hypoxic in triage at 74% on 2 L [place], this was increased to 4 L then 6 L but still at 83%. After adjustment to NRB 15 L the patient recovered to 91%. He was treated for suspected CHF exacerbation and AECOPD. His hypoxia improved after treatment, but appeared to have increased work of breathing and was placed on BIPAP support. Due to history of DVT without chronic anticoagulation- CT angio ordered. During transport to CT angio patient vomited into his BIPAP mask which was removed then for safety. Upon arrival back to his room, he was somnolent and hypoxic requiring intubation and mechanical ventilatory support. Imaging negative for PE but concerning for possible aspiration. Labs significant for respiratory acidosis, elevated BNP  suggestive of HFpEF exacerbation, mild Transaminitis and elevated troponin.   Medications given: Lasix , etomidate /rocuronium , solu-medrol , duo-neb with propofol  drip started  Initial Vitals: 99.1, 20, 77, 128/90 & 74% on 2L [place]   VBG: 7.29/ 74/ 59/ 35.6 >> ABG: 7.2/ 102/ 56/ 39.9 CXR 11/28/23: Cardiomegaly and pulmonary vascular congestion. Interstitial coarsening in the lower lungs, favor edema. CT angio chest PE 11/28/23:  Negative for pulmonary embolism. Peri-bronchovascular consolidation in the right lower lobe, left lingula, greatest in the left lower lobe. Findings may be due to atelectasis however aspiration or pneumonia could appear similarly. Acute or subacute appearing fracture of T10 with 25% vertebral body height loss and no retropulsion. Ascending aortic aneurysm measuring 47 mm in diameter. This is not substantially changed from 02/16/2023 using similar measuring technique. Aortic Atherosclerosis (ICD10-I70.0).   PCCM consulted for admission due to acute on chronic hypercapnic and hypoxic respiratory failure s/t suspected aspiration in the setting of HFpEF & AECOPD requiring intubation and mechanical ventilatory support.  Pertinent  Medical History   Stage 3 Severe COPD 2 L [place] Former smoker (09/2022)- 51 pack year history HFpEF DVT no on anticoagulation ETOH abuse Erythrocytosis followed by hematology PTSD  Thoracic aortic aneurysm f/b cardiology & vascular Lung nodule HLD CAD s/p MI HTN OSA  Significant Hospital Events: Including procedures, antibiotic start and stop dates in addition to other pertinent events   11/28/23: Admit to ICU due to acute on chronic hypercapnic and hypoxic respiratory failure s/t suspected aspiration in the setting of HFpEF & AECOPD  requiring intubation and mechanical ventilatory support. 11/29/23: remains intubated and ventilated, sedated and unresponsive   Interim History / Subjective:  Intubated and sedated. Unresponsive.  Objective     Blood pressure 130/82, pulse 63, temperature 98.6 F (37 C), resp. rate 20, height 6' (1.829 m), weight 102.6 kg, SpO2 94%.    Vent Mode: PRVC FiO2 (%):  [70 %-100 %] 70 % Set Rate:  [20 bmp] 20 bmp Vt Set:  [450 mL-500 mL] 500 mL PEEP:  [10 cmH20] 10 cmH20 Plateau Pressure:  [14 cmH20] 14 cmH20   Intake/Output Summary (Last 24 hours) at 11/29/2023 1721 Last data filed at 11/29/2023 1600 Gross per 24 hour  Intake 1158.92 ml  Output 2275 ml  Net -1116.08 ml   Filed Weights   11/28/23 1752 11/28/23 2315 11/29/23 0345  Weight: 102.1 kg 102.6 kg 102.6 kg    Examination: Physical Exam Constitutional:      General: He is not in acute distress.    Appearance: He is ill-appearing.  Cardiovascular:     Rate and Rhythm: Normal rate and regular rhythm.     Pulses: Normal pulses.     Heart sounds: Normal heart sounds.  Pulmonary:     Effort: Pulmonary effort is normal.     Breath sounds: No wheezing.     Comments: Ventilated breath sounds bilaterally Abdominal:     Palpations: Abdomen is soft.  Neurological:     Mental Status: He is disoriented.     Assessment and Plan   #Acute on Chronic Hypoxic and Hypercapnic Respiratory Failure #Acute Exacerbation of COPD #Acute Decompensated HFpEF #Toxic Metabolic Encephalopathy #EtOH use #Nonischemic myocardial injury #Thoracic Aortic Aneurysm  73 year old male with history of chronic hypoxic and hypercapnic respiratory failure secondary to COPD presenting to the hospital with increased shortness of breath due to COPD exacerbation. He required intubation and mechanical ventilation after failing BiPAP for worsening acute on chronic hypoxic and hypercapnic respiratory failure.  Neuro - encephalopathy due to hypercapnia and CO2 narcosis, improved with ventilation. Currently on propofol  and fentanyl  for analgesia and sedation. Also on librium  for management of alcohol withdrawal. -propofol  to maintain RASS goal of -1 -fentanyl  to  maintain CPOT <2 -Daily wake up assessment  CV - with lower extremity edema on admission and elevated troponin and BNP. TTE with diastolic dysfunction suggesting HFpEF. Also possible is a rise in PVR due to hypoxic from the COPD. Suspect mild elevation in troponin secondary to non-ischemic myocardial injury from stress of COPD exacerbation and respiratory failure. Will continue to trend troponin and provide with gentle diuresis PRN.  Pulm - long standing advanced COPD (ratio of 0.61, FEV1 1.83L at 48% predicted) on triple therapy. Patient is actively smoking and presents with COPD exacerbation resulting in hypoxic and hypercapnic respiratory failure. Now intubated with improved blood gas. Continued on COPD treatment with antibiotics, steroids (methylpred 40 bid) and nebulizers. -Full vent support, implement lung protective strategies -Plateau pressures less than 30 cm H20 -Wean FiO2 & PEEP as tolerated to maintain O2 sats >92% -Follow intermittent Chest X-ray & ABG as needed -SBT  when respiratory parameters met  GI - PPI for SUP, tube feeds starting tomorrow Renal - kidney function at baseline, holding on diuresis for now Endo - ICU glycemic protocol, on methylpred 40 bid, will switch to daily starting tomorrow Hem/Onc - enoxaparin  for DVT prophylaxis ID - on antibiotics for management of COPD exacerbation. Respiratory cultures sent and pending speciation. Viral panel sent.  Best Practice (right click  and Reselect all SmartList Selections daily)   Diet/type: NPO DVT prophylaxis LMWH Pressure ulcer(s): N/A GI prophylaxis: PPI Lines: N/A Foley:  Yes, and it is still needed Code Status:  full code Last date of multidisciplinary goals of care discussion [11/29/2023]  Labs   CBC: Recent Labs  Lab 11/28/23 1753 11/29/23 0345  WBC 8.3 8.7  HGB 16.4 16.3  HCT 50.7 50.4  MCV 97.7 96.7  PLT 237 198    Basic Metabolic Panel: Recent Labs  Lab 11/28/23 1753 11/29/23 0007  11/29/23 0345  NA 137  --  139  K 3.9  --  4.1  CL 94*  --  97*  CO2 32  --  29  GLUCOSE 122*  --  143*  BUN 31*  --  24*  CREATININE 0.89  --  0.75  CALCIUM  9.2  --  8.7*  MG  --  2.1 2.3  PHOS  --   --  3.5   GFR: Estimated Creatinine Clearance: 100.4 mL/min (by C-G formula based on SCr of 0.75 mg/dL). Recent Labs  Lab 11/28/23 1753 11/29/23 0105 11/29/23 0345  PROCALCITON  --  <0.10  --   WBC 8.3  --  8.7  LATICACIDVEN 1.3  --   --     Liver Function Tests: Recent Labs  Lab 11/28/23 1753 11/29/23 0345  AST 42* 37  ALT 68* 63*  ALKPHOS 66 56  BILITOT 1.0 0.8  PROT 7.0 6.1*  ALBUMIN 3.5 3.0*   No results for input(s): LIPASE, AMYLASE in the last 168 hours. No results for input(s): AMMONIA in the last 168 hours.  ABG    Component Value Date/Time   PHART 7.4 11/29/2023 0513   PCO2ART 62 (H) 11/29/2023 0513   PO2ART 76 (L) 11/29/2023 0513   HCO3 38.4 (H) 11/29/2023 0513   O2SAT 97.3 11/29/2023 0513     Coagulation Profile: No results for input(s): INR, PROTIME in the last 168 hours.  Cardiac Enzymes: No results for input(s): CKTOTAL, CKMB, CKMBINDEX, TROPONINI in the last 168 hours.  HbA1C: Hgb A1c MFr Bld  Date/Time Value Ref Range Status  09/28/2022 05:24 PM 5.4 4.8 - 5.6 % Final    Comment:    (NOTE) Pre diabetes:          5.7%-6.4%  Diabetes:              >6.4%  Glycemic control for   <7.0% adults with diabetes     CBG: Recent Labs  Lab 11/28/23 2302 11/29/23 0342 11/29/23 0726 11/29/23 1122 11/29/23 1628  GLUCAP 138* 151* 128* 118* 130*    Review of Systems:   N/A  Past Medical History:  He,  has a past medical history of Aneurysm (HCC), COPD (chronic obstructive pulmonary disease) (HCC), Coronary artery disease, Erythrocytosis (12/01/2018), Hyperlipidemia, Hypertension, and Myocardial infarction (HCC).   Surgical History:   Past Surgical History:  Procedure Laterality Date   APPENDECTOMY     COLONOSCOPY  WITH PROPOFOL  N/A 11/20/2014   Procedure: COLONOSCOPY WITH PROPOFOL ;  Surgeon: Louanne KANDICE Muse, MD;  Location: [facility] ENDOSCOPY;  Service: Endoscopy;  Laterality: N/A;   COLONOSCOPY WITH PROPOFOL  N/A 03/19/2022   Procedure: COLONOSCOPY WITH PROPOFOL ;  Surgeon: Unk Corinn Skiff, MD;  Location: [facility] ENDOSCOPY;  Service: Gastroenterology;  Laterality: N/A;   TONSILLECTOMY AND ADENOIDECTOMY       Social History:   reports that he quit smoking about 13 months ago. His smoking use included cigarettes. He started smoking about 52 years ago.  He has a 51 pack-year smoking history. He has never used smokeless tobacco. He reports current alcohol use. He reports that he does not use drugs.   Family History:  His family history is not on file.   Allergies No Known Allergies   Home Medications  Prior to Admission medications   Medication Sig Start Date End Date Taking? Authorizing Provider  aspirin  81 MG chewable tablet Chew 81 mg by mouth daily.   Yes [provider]  atorvastatin  (LIPITOR) 20 MG tablet Take 1 tablet (20 mg total) by mouth daily. 07/26/23  Yes Bernardo Fend, DO  budeson-glycopyrrolate -formoterol  (BREZTRI  AEROSPHERE) 160-9-4.8 MCG/ACT AERO inhaler Inhale 2 puffs into the lungs in the morning and at bedtime. 07/26/23  Yes Bernardo Fend, DO  carvedilol  (COREG ) 12.5 MG tablet Take 1 tablet (12.5 mg total) by mouth 2 (two) times daily. 07/26/23 07/20/24 Yes Bernardo Fend, DO  fluticasone  (FLONASE ) 50 MCG/ACT nasal spray Place 2 sprays into both nostrils daily. 05/21/22  Yes Bernardo Fend, DO  folic acid  (FOLVITE ) 1 MG tablet Take 1 tablet (1 mg total) by mouth daily. 10/06/22  Yes Caleen Qualia, MD  hydrochlorothiazide  (HYDRODIURIL ) 25 MG tablet Take 1 tablet (25 mg total) by mouth daily. 09/27/23  Yes Bernardo Fend, DO  losartan  (COZAAR ) 50 MG tablet TAKE 1 TABLET BY MOUTH DAILY 09/13/23  Yes Bernardo Fend, DO  Multiple Vitamin (MULTIVITAMIN WITH MINERALS)  TABS tablet Take 1 tablet by mouth daily. 10/06/22  Yes Caleen Qualia, MD  VENTOLIN  HFA 108 (90 Base) MCG/ACT inhaler USE 2 INHALATIONS BY MOUTH EVERY 6 HOURS AS NEEDED FOR WHEEZING  OR SHORTNESS OF BREATH 07/08/23  Yes Bernardo Fend, DO     The patient is critically ill due to acute on chronic hypoxic and hypercapnic respiratory failure, COPD exacerbation, encephalopathy.  Critical care was necessary to treat or prevent imminent or life-threatening deterioration. Critical care time was spent by me on the following activities: development of a treatment plan with the patient and/or surrogate as well as nursing, discussions with consultants, evaluation of the patient's response to treatment, examination of the patient, obtaining a history from the patient or surrogate, ordering and performing treatments and interventions, ordering and review of laboratory studies, ordering and review of radiographic studies, review of telemetry data including pulse oximetry, re-evaluation of patient's condition and participation in multidisciplinary rounds.   I personally spent 53 minutes providing critical care not including any separately billable procedures.   Belva November, MD [facility] 11/29/2023 5:42 PM

## 2023-11-30 ENCOUNTER — Inpatient Hospital Stay

## 2023-11-30 DIAGNOSIS — I5033 Acute on chronic diastolic (congestive) heart failure: Secondary | ICD-10-CM | POA: Diagnosis not present

## 2023-11-30 DIAGNOSIS — J9621 Acute and chronic respiratory failure with hypoxia: Secondary | ICD-10-CM | POA: Diagnosis not present

## 2023-11-30 DIAGNOSIS — J441 Chronic obstructive pulmonary disease with (acute) exacerbation: Secondary | ICD-10-CM | POA: Diagnosis not present

## 2023-11-30 DIAGNOSIS — J9622 Acute and chronic respiratory failure with hypercapnia: Secondary | ICD-10-CM | POA: Diagnosis not present

## 2023-11-30 LAB — RENAL FUNCTION PANEL
Albumin: 2.9 g/dL — ABNORMAL LOW (ref 3.5–5.0)
Anion gap: 11 (ref 5–15)
BUN: 29 mg/dL — ABNORMAL HIGH (ref 8–23)
CO2: 34 mmol/L — ABNORMAL HIGH (ref 22–32)
Calcium: 8.6 mg/dL — ABNORMAL LOW (ref 8.9–10.3)
Chloride: 93 mmol/L — ABNORMAL LOW (ref 98–111)
Creatinine, Ser: 0.92 mg/dL (ref 0.61–1.24)
GFR, Estimated: >60 mL/min (ref >60)
Glucose, Bld: 155 mg/dL — ABNORMAL HIGH (ref 70–99)
Phosphorus: 3.8 mg/dL (ref 2.5–4.6)
Potassium: 3.8 mmol/L (ref 3.5–5.1)
Sodium: 138 mmol/L (ref 135–145)

## 2023-11-30 LAB — CBC
HCT: 50.3 % (ref 39.0–52.0)
Hemoglobin: 16.3 g/dL (ref 13.0–17.0)
MCH: 31.3 pg (ref 26.0–34.0)
MCHC: 32.4 g/dL (ref 30.0–36.0)
MCV: 96.5 fL (ref 80.0–100.0)
Platelets: 199 K/uL (ref 150–400)
RBC: 5.21 MIL/uL (ref 4.22–5.81)
RDW: 14.4 % (ref 11.5–15.5)
WBC: 9.5 K/uL (ref 4.0–10.5)
nRBC: 0 % (ref 0.0–0.2)

## 2023-11-30 LAB — GLUCOSE, CAPILLARY
Glucose-Capillary: 100 mg/dL — ABNORMAL HIGH (ref 70–99)
Glucose-Capillary: 103 mg/dL — ABNORMAL HIGH (ref 70–99)
Glucose-Capillary: 131 mg/dL — ABNORMAL HIGH (ref 70–99)
Glucose-Capillary: 147 mg/dL — ABNORMAL HIGH (ref 70–99)
Glucose-Capillary: 149 mg/dL — ABNORMAL HIGH (ref 70–99)
Glucose-Capillary: 160 mg/dL — ABNORMAL HIGH (ref 70–99)

## 2023-11-30 LAB — RESPIRATORY PANEL BY PCR

## 2023-11-30 LAB — MAGNESIUM: Magnesium: 2.5 mg/dL — ABNORMAL HIGH (ref 1.7–2.4)

## 2023-11-30 LAB — T3: T3, Total: 56 ng/dL — ABNORMAL LOW (ref 71–180)

## 2023-11-30 MED ORDER — DEXMEDETOMIDINE HCL IN NACL 400 MCG/100ML IV SOLN
0.0000 ug/kg/h | INTRAVENOUS | Status: DC
  Administered 2023-11-30: 0.4 ug/kg/h via INTRAVENOUS
  Administered 2023-11-30: 0.6 ug/kg/h via INTRAVENOUS
  Filled 2023-11-30 (×2): qty 100

## 2023-11-30 MED ORDER — FOLIC ACID 1 MG PO TABS
1.0000 mg | ORAL_TABLET | Freq: Every day | ORAL | Status: DC
  Administered 2023-12-01 – 2023-12-04 (×4): 1 mg via ORAL
  Filled 2023-11-30 (×4): qty 1

## 2023-11-30 MED ORDER — ACETAMINOPHEN 325 MG PO TABS: 650.0000 mg | ORAL_TABLET | Freq: Four times a day (QID) | ORAL | Status: DC | PRN

## 2023-11-30 MED ORDER — METHYLPREDNISOLONE SODIUM SUCC 40 MG IJ SOLR
40.0000 mg | INTRAMUSCULAR | Status: DC
  Administered 2023-11-30 – 2023-12-01 (×2): 40 mg via INTRAVENOUS
  Filled 2023-11-30 (×2): qty 1

## 2023-11-30 MED ORDER — ADULT MULTIVITAMIN W/MINERALS CH
1.0000 | ORAL_TABLET | Freq: Every day | ORAL | Status: DC
  Administered 2023-12-01 – 2023-12-04 (×4): 1 via ORAL
  Filled 2023-11-30 (×4): qty 1

## 2023-11-30 MED ORDER — IPRATROPIUM-ALBUTEROL 0.5-2.5 (3) MG/3ML IN SOLN
3.0000 mL | Freq: Three times a day (TID) | RESPIRATORY_TRACT | Status: DC
  Administered 2023-11-30 – 2023-12-04 (×10): 3 mL via RESPIRATORY_TRACT
  Filled 2023-11-30 (×10): qty 3

## 2023-11-30 MED ORDER — ACETAMINOPHEN 650 MG RE SUPP: 650.0000 mg | Freq: Four times a day (QID) | RECTAL | Status: DC | PRN

## 2023-11-30 NOTE — Progress Notes (Signed)
 Pt has been off sedation and in PSV 5/5 since about 10:00 this morning.  Pt is currently awake, following all commands, nodding to questions.    RR in the 20's with TV's 500-600, Rapid Shallow Breathing Index score of 50 indicative of successful wean.  No significant secretion burden.  Discussed with Dr. Isadora, will extubate to BiPAP. RN and RT aware of plan.     Inge Lecher, AGACNP-BC [facility] Prefer epic messenger for cross cover needs If after hours, please call E-link

## 2023-11-30 NOTE — Procedures (Signed)
 Extubation Procedure Note  Patient Details:   Name: Joshua Schmidt DOB: 10/01/1950 MRN: [id number]   Airway Documentation:    Vent end date: 11/30/23 Vent end time: 1230   Evaluation  O2 sats: stable throughout Complications: No apparent complications Patient did tolerate procedure well. Bilateral Breath Sounds: Diminished, Clear   Patient extubated to BiPAP 12/6 40% with no complications.  Pt able to speak and cough after extubation.    Evalene DELENA Jerry 11/30/2023, 12:49 PM

## 2023-11-30 NOTE — Progress Notes (Signed)
 NAME:  Joshua Schmidt, MRN:  [id number], DOB:  September 17, 1950, LOS: 2 ADMISSION DATE:  11/28/2023,  CHIEF COMPLAINT:  Respiratory Failure   History of Present Illness:   73 yo M presenting to [facility] ED from home for evaluation of hypoxia and AMS since the afternoon of 11/28/23 around 4 pm.   History obtained per chart review and spouse telephone report as patient is intubated and sedated at this time. Patient was described by spouse to be in his normal state of health until Sat night 8/16. He had been sitting with her out on the porch with his O2 on and then was unable to walk into the house, falling to his knees having to crawl. He is normally independent of ADL's and handles his own medication.   Overnight his wife suspected he had removed his oxygen , and then didn't wake up until the afternoon. When he woke up he was walking normally but acting irrationally and was confused.  She denied any other symptoms including: cough, congestion, fevers/chills, urinary symptoms, abdominal pain/ nausea/ vomiting/ diarrhea. She reports that he consumes 3 beers nightly and has started smoking again. He has told her only 1 cigarette a day, but can't be sure.   ED course: Upon arrival patient hypoxic in triage at 74% on 2 L [place], this was increased to 4 L then 6 L but still at 83%. After adjustment to NRB 15 L the patient recovered to 91%. He was treated for suspected CHF exacerbation and AECOPD. His hypoxia improved after treatment, but appeared to have increased work of breathing and was placed on BIPAP support. Due to history of DVT without chronic anticoagulation- CT angio ordered. During transport to CT angio patient vomited into his BIPAP mask which was removed then for safety. Upon arrival back to his room, he was somnolent and hypoxic requiring intubation and mechanical ventilatory support. Imaging negative for PE but concerning for possible aspiration. Labs significant for respiratory acidosis, elevated BNP  suggestive of HFpEF exacerbation, mild Transaminitis and elevated troponin.   Medications given: Lasix , etomidate /rocuronium , solu-medrol , duo-neb with propofol  drip started  Initial Vitals: 99.1, 20, 77, 128/90 & 74% on 2L [place]   VBG: 7.29/ 74/ 59/ 35.6 >> ABG: 7.2/ 102/ 56/ 39.9 CXR 11/28/23: Cardiomegaly and pulmonary vascular congestion. Interstitial coarsening in the lower lungs, favor edema. CT angio chest PE 11/28/23:  Negative for pulmonary embolism. Peri-bronchovascular consolidation in the right lower lobe, left lingula, greatest in the left lower lobe. Findings may be due to atelectasis however aspiration or pneumonia could appear similarly. Acute or subacute appearing fracture of T10 with 25% vertebral body height loss and no retropulsion. Ascending aortic aneurysm measuring 47 mm in diameter. This is not substantially changed from 02/16/2023 using similar measuring technique. Aortic Atherosclerosis (ICD10-I70.0).   PCCM consulted for admission due to acute on chronic hypercapnic and hypoxic respiratory failure s/t suspected aspiration in the setting of HFpEF & AECOPD requiring intubation and mechanical ventilatory support.  Pertinent  Medical History   Stage 3 Severe COPD 2 L [place] Former smoker (09/2022)- 51 pack year history HFpEF DVT no on anticoagulation ETOH abuse Erythrocytosis followed by hematology PTSD  Thoracic aortic aneurysm f/b cardiology & vascular Lung nodule HLD CAD s/p MI HTN OSA  Significant Hospital Events: Including procedures, antibiotic start and stop dates in addition to other pertinent events   11/28/23: Admit to ICU due to acute on chronic hypercapnic and hypoxic respiratory failure s/t suspected aspiration in the setting of HFpEF & AECOPD  requiring intubation and mechanical ventilatory support. 11/29/23: remains intubated and ventilated, sedated and unresponsive 11/30/23: intubated and ventilated, follows commands off sedation   Interim History /  Subjective:  Unresponsive in AM, more awake with lower sedation  Objective    Blood pressure 119/77, pulse 62, temperature 97.9 F (36.6 C), temperature source Axillary, resp. rate 19, height 6' (1.829 m), weight 104.9 kg, SpO2 97%.    Vent Mode: PSV;CPAP FiO2 (%):  [60 %-70 %] 60 % Set Rate:  [20 bmp] 20 bmp Vt Set:  [500 mL-514 mL] 500 mL PEEP:  [5 cmH20-10 cmH20] 5 cmH20 Pressure Support:  [5 cmH20] 5 cmH20 Plateau Pressure:  [12 cmH20] 12 cmH20   Intake/Output Summary (Last 24 hours) at 11/30/2023 1114 Last data filed at 11/30/2023 1000 Gross per 24 hour  Intake 2066.33 ml  Output 987 ml  Net 1079.33 ml   Filed Weights   11/28/23 2315 11/29/23 0345 11/30/23 0439  Weight: 102.6 kg 102.6 kg 104.9 kg    Examination: Physical Exam Constitutional:      General: He is not in acute distress.    Appearance: He is ill-appearing.  Cardiovascular:     Rate and Rhythm: Normal rate and regular rhythm.     Pulses: Normal pulses.     Heart sounds: Normal heart sounds.  Pulmonary:     Effort: Pulmonary effort is normal.     Breath sounds: No wheezing.     Comments: Ventilated breath sounds bilaterally Abdominal:     Palpations: Abdomen is soft.  Neurological:     Mental Status: He is disoriented.     Assessment and Plan   #Acute on Chronic Hypoxic and Hypercapnic Respiratory Failure #Acute Exacerbation of COPD #Acute Decompensated HFpEF #Toxic Metabolic Encephalopathy #EtOH use #Nonischemic myocardial injury #Thoracic Aortic Aneurysm  73 year old male with history of chronic hypoxic and hypercapnic respiratory failure secondary to COPD presenting to the hospital with increased shortness of breath due to COPD exacerbation. He required intubation and mechanical ventilation after failing BiPAP for worsening acute on chronic hypoxic and hypercapnic respiratory failure.  Neuro - encephalopathy due to hypercapnia and CO2 narcosis, improved with ventilation. Currently on  propofol  and fentanyl  for analgesia and sedation. D/C librium  for management of EtOH withdrawal after discussion with wife. Wake up assessment today -propofol  to maintain RASS goal of -1 -fentanyl  to maintain CPOT <2 -Daily wake up assessment  CV - with lower extremity edema on admission and elevated troponin and BNP. TTE with diastolic dysfunction suggesting HFpEF. Also possible is a rise in PVR due to hypoxic from the COPD. Suspect mild elevation in troponin secondary to non-ischemic myocardial injury from stress of COPD exacerbation and respiratory failure. Troponin trended down. Holding on diuresis at the moment.  Pulm - long standing advanced COPD (ratio of 0.61, FEV1 1.83L at 48% predicted) on triple therapy. Patient is actively smoking and presents with COPD exacerbation resulting in hypoxic and hypercapnic respiratory failure. Now intubated with improved blood gas. Continued on COPD treatment with antibiotics, steroids (switch steroids to daily) and nebulizers. -Full vent support, implement lung protective strategies -Plateau pressures less than 30 cm H20 -Wean FiO2 & PEEP as tolerated to maintain O2 sats >92% -Follow intermittent Chest X-ray & ABG as needed -SBT today -will need nocturnal BiPAP moving forward after extubation  GI - PPI for SUP, tube feeds started (on hold for WUA/SBT) Renal - kidney function at baseline, holding diuresis for now Endo - ICU glycemic protocol, daily methylpred. Hem/Onc -  enoxaparin  for DVT prophylaxis ID - on antibiotics for management of COPD exacerbation. Respiratory cultures sent and pending speciation. Viral panel sent and negative.  Best Practice (right click and Reselect all SmartList Selections daily)   Diet/type: tubefeeds DVT prophylaxis LMWH Pressure ulcer(s): N/A GI prophylaxis: PPI Lines: N/A Foley:  Yes, and it is still needed Code Status:  full code Last date of multidisciplinary goals of care discussion [11/30/2023]  Labs    CBC: Recent Labs  Lab 11/28/23 1753 11/29/23 0345 11/30/23 0543  WBC 8.3 8.7 9.5  HGB 16.4 16.3 16.3  HCT 50.7 50.4 50.3  MCV 97.7 96.7 96.5  PLT 237 198 199    Basic Metabolic Panel: Recent Labs  Lab 11/28/23 1753 11/29/23 0007 11/29/23 0345 11/29/23 1826 11/30/23 0543  NA 137  --  139 135 138  K 3.9  --  4.1 3.8 3.8  CL 94*  --  97* 93* 93*  CO2 32  --  29 32 34*  GLUCOSE 122*  --  143* 147* 155*  BUN 31*  --  24* 26* 29*  CREATININE 0.89  --  0.75 0.80 0.92  CALCIUM  9.2  --  8.7* 8.5* 8.6*  MG  --  2.1 2.3 2.0 2.5*  PHOS  --   --  3.5  --  3.8   GFR: Estimated Creatinine Clearance: 88.2 mL/min (by C-G formula based on SCr of 0.92 mg/dL). Recent Labs  Lab 11/28/23 1753 11/29/23 0105 11/29/23 0345 11/30/23 0543  PROCALCITON  --  <0.10  --   --   WBC 8.3  --  8.7 9.5  LATICACIDVEN 1.3  --   --   --     Liver Function Tests: Recent Labs  Lab 11/28/23 1753 11/29/23 0345 11/30/23 0543  AST 42* 37  --   ALT 68* 63*  --   ALKPHOS 66 56  --   BILITOT 1.0 0.8  --   PROT 7.0 6.1*  --   ALBUMIN 3.5 3.0* 2.9*   No results for input(s): LIPASE, AMYLASE in the last 168 hours. No results for input(s): AMMONIA in the last 168 hours.  ABG    Component Value Date/Time   PHART 7.4 11/29/2023 0513   PCO2ART 62 (H) 11/29/2023 0513   PO2ART 76 (L) 11/29/2023 0513   HCO3 38.4 (H) 11/29/2023 0513   O2SAT 97.3 11/29/2023 0513     Coagulation Profile: No results for input(s): INR, PROTIME in the last 168 hours.  Cardiac Enzymes: No results for input(s): CKTOTAL, CKMB, CKMBINDEX, TROPONINI in the last 168 hours.  HbA1C: Hgb A1c MFr Bld  Date/Time Value Ref Range Status  09/28/2022 05:24 PM 5.4 4.8 - 5.6 % Final    Comment:    (NOTE) Pre diabetes:          5.7%-6.4%  Diabetes:              >6.4%  Glycemic control for   <7.0% adults with diabetes     CBG: Recent Labs  Lab 11/29/23 1628 11/29/23 1936 11/30/23 0030 11/30/23 0438  11/30/23 0758  GLUCAP 130* 136* 149* 147* 160*    Review of Systems:   N/A  Past Medical History:  He,  has a past medical history of Aneurysm (HCC), COPD (chronic obstructive pulmonary disease) (HCC), Coronary artery disease, Erythrocytosis (12/01/2018), Hyperlipidemia, Hypertension, and Myocardial infarction (HCC).   Surgical History:   Past Surgical History:  Procedure Laterality Date   APPENDECTOMY     COLONOSCOPY WITH PROPOFOL   N/A 11/20/2014   Procedure: COLONOSCOPY WITH PROPOFOL ;  Surgeon: Louanne KANDICE Muse, MD;  Location: [facility] ENDOSCOPY;  Service: Endoscopy;  Laterality: N/A;   COLONOSCOPY WITH PROPOFOL  N/A 03/19/2022   Procedure: COLONOSCOPY WITH PROPOFOL ;  Surgeon: Unk Corinn Skiff, MD;  Location: [facility] ENDOSCOPY;  Service: Gastroenterology;  Laterality: N/A;   TONSILLECTOMY AND ADENOIDECTOMY       Social History:   reports that he quit smoking about 13 months ago. His smoking use included cigarettes. He started smoking about 52 years ago. He has a 51 pack-year smoking history. He has never used smokeless tobacco. He reports current alcohol use. He reports that he does not use drugs.   Family History:  His family history is not on file.   Allergies No Known Allergies   Home Medications  Prior to Admission medications   Medication Sig Start Date End Date Taking? Authorizing Provider  aspirin  81 MG chewable tablet Chew 81 mg by mouth daily.   Yes [provider]  atorvastatin  (LIPITOR) 20 MG tablet Take 1 tablet (20 mg total) by mouth daily. 07/26/23  Yes Bernardo Fend, DO  budeson-glycopyrrolate -formoterol  (BREZTRI  AEROSPHERE) 160-9-4.8 MCG/ACT AERO inhaler Inhale 2 puffs into the lungs in the morning and at bedtime. 07/26/23  Yes Bernardo Fend, DO  carvedilol  (COREG ) 12.5 MG tablet Take 1 tablet (12.5 mg total) by mouth 2 (two) times daily. 07/26/23 07/20/24 Yes Bernardo Fend, DO  fluticasone  (FLONASE ) 50 MCG/ACT nasal spray Place 2 sprays into  both nostrils daily. 05/21/22  Yes Bernardo Fend, DO  folic acid  (FOLVITE ) 1 MG tablet Take 1 tablet (1 mg total) by mouth daily. 10/06/22  Yes Caleen Qualia, MD  hydrochlorothiazide  (HYDRODIURIL ) 25 MG tablet Take 1 tablet (25 mg total) by mouth daily. 09/27/23  Yes Bernardo Fend, DO  losartan  (COZAAR ) 50 MG tablet TAKE 1 TABLET BY MOUTH DAILY 09/13/23  Yes Bernardo Fend, DO  Multiple Vitamin (MULTIVITAMIN WITH MINERALS) TABS tablet Take 1 tablet by mouth daily. 10/06/22  Yes Caleen Qualia, MD  VENTOLIN  HFA 108 (90 Base) MCG/ACT inhaler USE 2 INHALATIONS BY MOUTH EVERY 6 HOURS AS NEEDED FOR WHEEZING  OR SHORTNESS OF BREATH 07/08/23  Yes Bernardo Fend, DO     The patient is critically ill due to acute on chronic hypoxic and hypercapnic respiratory failure, COPD exacerbation.  Critical care was necessary to treat or prevent imminent or life-threatening deterioration. Critical care time was spent by me on the following activities: development of a treatment plan with the patient and/or surrogate as well as nursing, discussions with consultants, evaluation of the patient's response to treatment, examination of the patient, obtaining a history from the patient or surrogate, ordering and performing treatments and interventions, ordering and review of laboratory studies, ordering and review of radiographic studies, review of telemetry data including pulse oximetry, re-evaluation of patient's condition and participation in multidisciplinary rounds.   I personally spent 53 minutes providing critical care not including any separately billable procedures.   Belva November, MD [place] [facility] 11/30/2023 11:17 AM

## 2023-11-30 NOTE — Plan of Care (Signed)
  Problem: Activity: Goal: Ability to tolerate increased activity will improve Outcome: Progressing   Problem: Respiratory: Goal: Ability to maintain a clear airway and adequate ventilation will improve Outcome: Progressing   Problem: Role Relationship: Goal: Method of communication will improve Outcome: Progressing   Problem: Education: Goal: Knowledge of General Education information will improve Description: Including pain rating scale, medication(s)/side effects and non-pharmacologic comfort measures Outcome: Progressing   Problem: Health Behavior/Discharge Planning: Goal: Ability to manage health-related needs will improve Outcome: Progressing   Problem: Clinical Measurements: Goal: Ability to maintain clinical measurements within normal limits will improve Outcome: Progressing Goal: Will remain free from infection Outcome: Progressing Goal: Diagnostic test results will improve Outcome: Progressing Goal: Respiratory complications will improve Outcome: Progressing Goal: Cardiovascular complication will be avoided Outcome: Progressing   Problem: Activity: Goal: Risk for activity intolerance will decrease Outcome: Progressing   Problem: Nutrition: Goal: Adequate nutrition will be maintained Outcome: Progressing   Problem: Coping: Goal: Level of anxiety will decrease Outcome: Progressing   Problem: Elimination: Goal: Will not experience complications related to bowel motility Outcome: Progressing Goal: Will not experience complications related to urinary retention Outcome: Progressing   Problem: Pain Managment: Goal: General experience of comfort will improve and/or be controlled Outcome: Progressing   Problem: Safety: Goal: Ability to remain free from injury will improve Outcome: Progressing   Problem: Skin Integrity: Goal: Risk for impaired skin integrity will decrease Outcome: Progressing

## 2023-11-30 NOTE — Progress Notes (Signed)
 Extubation Procedure Note  Patient Details:   Name: DEVANTE CAPANO DOB: 26-Sep-1950 MRN: [id number]   Airway Documentation:    Vent end date: 11/30/23 Vent end time: 1230   Evaluation  O2 sats: stable throughout Complications: No apparent complications Patient did tolerate procedure well. Bilateral Breath Sounds: Diminished, Clear   Yes  Lonni JONELLE Matt 11/30/2023, 2:22 PM

## 2023-11-30 NOTE — Progress Notes (Signed)
 Called by RN for pt low Ve, found pt with .25 Ve and no return exhaled Vt. Pt ETT not at 21cm at lip, secured  21cm at lip. Ve now 10.0, exhaled Vt 509. Pt in no apparent distress, RN remained at bedside. SPO2 98%, RR 20.

## 2023-11-30 NOTE — Plan of Care (Signed)
  Problem: Respiratory: Goal: Ability to maintain a clear airway and adequate ventilation will improve Outcome: Progressing   Problem: Clinical Measurements: Goal: Ability to maintain clinical measurements within normal limits will improve Outcome: Progressing Goal: Diagnostic test results will improve Outcome: Progressing Goal: Respiratory complications will improve Outcome: Progressing Goal: Cardiovascular complication will be avoided Outcome: Progressing

## 2023-12-01 DIAGNOSIS — J9621 Acute and chronic respiratory failure with hypoxia: Secondary | ICD-10-CM | POA: Diagnosis not present

## 2023-12-01 LAB — RENAL FUNCTION PANEL
Albumin: 2.8 g/dL — ABNORMAL LOW (ref 3.5–5.0)
Anion gap: 9 (ref 5–15)
BUN: 20 mg/dL (ref 8–23)
CO2: 34 mmol/L — ABNORMAL HIGH (ref 22–32)
Calcium: 8.7 mg/dL — ABNORMAL LOW (ref 8.9–10.3)
Chloride: 98 mmol/L (ref 98–111)
Creatinine, Ser: 0.73 mg/dL (ref 0.61–1.24)
GFR, Estimated: >60 mL/min (ref >60)
Glucose, Bld: 121 mg/dL — ABNORMAL HIGH (ref 70–99)
Phosphorus: 2.8 mg/dL (ref 2.5–4.6)
Potassium: 4.4 mmol/L (ref 3.5–5.1)
Sodium: 141 mmol/L (ref 135–145)

## 2023-12-01 LAB — CULTURE, RESPIRATORY W GRAM STAIN: Culture: NORMAL

## 2023-12-01 LAB — CBC
HCT: 51.2 % (ref 39.0–52.0)
Hemoglobin: 16.3 g/dL (ref 13.0–17.0)
MCH: 31 pg (ref 26.0–34.0)
MCHC: 31.8 g/dL (ref 30.0–36.0)
MCV: 97.5 fL (ref 80.0–100.0)
Platelets: 178 K/uL (ref 150–400)
RBC: 5.25 MIL/uL (ref 4.22–5.81)
RDW: 14.2 % (ref 11.5–15.5)
WBC: 9.5 K/uL (ref 4.0–10.5)
nRBC: 0 % (ref 0.0–0.2)

## 2023-12-01 LAB — GLUCOSE, CAPILLARY
Glucose-Capillary: 107 mg/dL — ABNORMAL HIGH (ref 70–99)
Glucose-Capillary: 111 mg/dL — ABNORMAL HIGH (ref 70–99)
Glucose-Capillary: 113 mg/dL — ABNORMAL HIGH (ref 70–99)
Glucose-Capillary: 119 mg/dL — ABNORMAL HIGH (ref 70–99)
Glucose-Capillary: 124 mg/dL — ABNORMAL HIGH (ref 70–99)
Glucose-Capillary: 86 mg/dL (ref 70–99)
Glucose-Capillary: 98 mg/dL (ref 70–99)

## 2023-12-01 LAB — MAGNESIUM: Magnesium: 2.2 mg/dL (ref 1.7–2.4)

## 2023-12-01 MED ORDER — IPRATROPIUM-ALBUTEROL 0.5-2.5 (3) MG/3ML IN SOLN
3.0000 mL | Freq: Once | RESPIRATORY_TRACT | Status: AC
  Administered 2023-12-01: 3 mL via RESPIRATORY_TRACT
  Filled 2023-12-01: qty 3

## 2023-12-01 MED ORDER — AZITHROMYCIN 250 MG PO TABS: 500.0000 mg | ORAL_TABLET | Freq: Every day | ORAL | Status: DC

## 2023-12-01 MED ORDER — AMOXICILLIN-POT CLAVULANATE 875-125 MG PO TABS
1.0000 | ORAL_TABLET | Freq: Two times a day (BID) | ORAL | Status: AC
  Administered 2023-12-02 – 2023-12-03 (×4): 1 via ORAL
  Filled 2023-12-01 (×4): qty 1

## 2023-12-01 MED ORDER — UMECLIDINIUM-VILANTEROL 62.5-25 MCG/ACT IN AEPB
1.0000 | INHALATION_SPRAY | Freq: Every day | RESPIRATORY_TRACT | Status: DC
  Administered 2023-12-02: 1 via RESPIRATORY_TRACT
  Filled 2023-12-01: qty 14

## 2023-12-01 MED ORDER — ATORVASTATIN CALCIUM 20 MG PO TABS
20.0000 mg | ORAL_TABLET | Freq: Every day | ORAL | Status: DC
Start: 2023-12-02 — End: 2023-12-04
  Administered 2023-12-02 – 2023-12-04 (×3): 20 mg via ORAL
  Filled 2023-12-01 (×3): qty 1

## 2023-12-01 MED ORDER — ASPIRIN 81 MG PO CHEW
81.0000 mg | CHEWABLE_TABLET | Freq: Every day | ORAL | Status: DC
  Administered 2023-12-02 – 2023-12-04 (×3): 81 mg via ORAL
  Filled 2023-12-01 (×3): qty 1

## 2023-12-01 MED ORDER — ENSURE PLUS HIGH PROTEIN PO LIQD
237.0000 mL | Freq: Three times a day (TID) | ORAL | Status: DC
  Administered 2023-12-01 – 2023-12-04 (×9): 237 mL via ORAL

## 2023-12-01 MED ORDER — CARVEDILOL 12.5 MG PO TABS
12.5000 mg | ORAL_TABLET | Freq: Two times a day (BID) | ORAL | Status: DC
  Administered 2023-12-01 – 2023-12-02 (×3): 12.5 mg via ORAL
  Filled 2023-12-01 (×3): qty 1

## 2023-12-01 MED ORDER — POLYETHYLENE GLYCOL 3350 17 G PO PACK: 17.0000 g | PACK | Freq: Every day | ORAL | Status: DC

## 2023-12-01 NOTE — Hospital Course (Addendum)
 Physical Exam

## 2023-12-01 NOTE — Progress Notes (Signed)
  Progress Note   Patient: Joshua Schmidt FMW:[id number] DOB: [birth year] DOA: 11/28/2023     3 DOS: the patient was seen and examined on 12/01/2023   Brief hospital course:  Assessment and Plan: Acute on chronic hypoxic and hypercapnic RF  AE of COPD   S/p azithromycin  and unasyn .  Continue two more days of augmentin .  Continue Iv steroids Bipap    HFpEF  Appears euvolemic    HTN CAD s/p MI  HLD Non ischemic myocardial injury  Resume statin Coreg  aspirin  Resume losartan  and HCTZ as able  DVT not on AC  OSA Bipap, consult TOC for help with securing this at home.   Infrarenal AAA Monitored outpatient  RCIA/LCIA Monitored outpatient stable.   TUD  Unclear if active smoker.      Subjective: Feels better still confused.   Physical Exam: Vitals:   12/01/23 1800 12/01/23 1830 12/01/23 1900 12/01/23 1930  BP:   129/70   Pulse: 74 80 88 76  Resp: (!) 33 (!) 22 (!) 22 (!) 31  Temp:    98.7 F (37.1 C)  TempSrc:    Oral  SpO2: 96% 94% 93% 94%  Weight:      Height:       Physical Exam  Constitutional: In no distress.  Cardiovascular: Normal rate, regular rhythm. Pedal edema   Pulmonary: Non labored breathing on [place], no rales, diffuse wheezing.   Abdominal: Soft. Normal bowel sounds. Non distended and non tender Musculoskeletal: Normal range of motion.     Neurological: Alert and oriented to person, place,but not time. Non focal  Skin: Skin is warm and dry.   Data Reviewed:     Latest Ref Rng & Units 12/01/2023    5:58 AM 11/30/2023    5:43 AM 11/29/2023    3:45 AM  CBC  WBC 4.0 - 10.5 K/uL 9.5  9.5  8.7   Hemoglobin 13.0 - 17.0 g/dL 83.6  83.6  83.6   Hematocrit 39.0 - 52.0 % 51.2  50.3  50.4   Platelets 150 - 400 K/uL 178  199  198       Latest Ref Rng & Units 12/01/2023    5:58 AM 11/30/2023    5:43 AM 11/29/2023    6:26 PM  BMP  Glucose 70 - 99 mg/dL 878  844  852   BUN 8 - 23 mg/dL 20  29  26    Creatinine 0.61 - 1.24 mg/dL 9.26  9.07  9.19    Sodium 135 - 145 mmol/L 141  138  135   Potassium 3.5 - 5.1 mmol/L 4.4  3.8  3.8   Chloride 98 - 111 mmol/L 98  93  93   CO2 22 - 32 mmol/L 34  34  32   Calcium  8.9 - 10.3 mg/dL 8.7  8.6  8.5      Family Communication: None at bedside   Disposition: Status is: Inpatient Remains inpatient appropriate because: respiratory status.   Planned Discharge Destination: Pending    Time spent: 35 minutes  Author: Alban Pepper, MD 12/01/2023 8:24 PM  For on call review [email or web address].

## 2023-12-01 NOTE — Progress Notes (Signed)
 Nutrition Follow Up Note   DOCUMENTATION CODES:   Not applicable  INTERVENTION:   Ensure Plus High Protein po TID, each supplement provides 350 kcal and 20 grams of protein  Magic cup TID with meals, each supplement provides 290 kcal and 9 grams of protein  MVI, folic acid  and thiamine  po daily   Pt at high refeed risk; recommend monitor potassium, magnesium and phosphorus labs daily until stable  Daily weights   NUTRITION DIAGNOSIS:   Inadequate oral intake related to inability to eat (pt sedated and ventilated) as evidenced by NPO status. -resolving   GOAL:   Patient will meet greater than or equal to 90% of their needs -not met   MONITOR:   PO intake, Supplement acceptance, Labs, Weight trends, I & O's, Skin  ASSESSMENT:   73 y/o male with h/o etoh abuse, IDA, aortic/iliac aneurysms, OSA, DVT, CHF, MI, HLD, HTN, COPD, CAD, lung nodule and CHF who is admitted with COPD and CHF exacerbation.  Pt extubated yesterday. Pt initiated on a heart healthy diet today. RD will add supplements to help pt meet his estimated needs. Pt remains at refeed risk. No BM since admission. Per chart, pt is up ~4lbs since admission. Pt +383ml on his I & Os.   Medications reviewed and include: augmentin , lovenox , folic acid , solu-medrol , MVI, miralax , thiamine , unasyn , thiamine    Labs reviewed: K 4.4 wnl, P 2.8 wnl, Mg 2.2 wnl Cbgs- 86, 107, 124, 119 x 24 hrs   UOP-   Diet Order:   Diet Order             Diet Heart Room service appropriate? Yes; Fluid consistency: Thin  Diet effective now                  EDUCATION NEEDS:   No education needs have been identified at this time  Skin:  Skin Assessment: Reviewed RN Assessment (ecchymosis)  Last BM:  pta  Height:   Ht Readings from Last 1 Encounters:  11/28/23 6' (1.829 m)    Weight:   Wt Readings from Last 1 Encounters:  12/01/23 104.5 kg    Ideal Body Weight:  80.9 kg  BMI:  Body mass index is 31.25  kg/m.  Estimated Nutritional Needs:   Kcal:  2200-2500kcal/day  Protein:  110-125g/day  Fluid:  2.0L/day  Augustin Shams MS, RD, LDN If unable to be reached, please send secure chat to RD inpatient available from 8:00a-4:00p daily

## 2023-12-01 NOTE — Plan of Care (Signed)
  Problem: Activity: Goal: Ability to tolerate increased activity will improve Outcome: Progressing   Problem: Respiratory: Goal: Ability to maintain a clear airway and adequate ventilation will improve Outcome: Progressing   Problem: Education: Goal: Knowledge of General Education information will improve Description: Including pain rating scale, medication(s)/side effects and non-pharmacologic comfort measures Outcome: Progressing   Problem: Clinical Measurements: Goal: Ability to maintain clinical measurements within normal limits will improve Outcome: Progressing Goal: Diagnostic test results will improve Outcome: Progressing Goal: Respiratory complications will improve Outcome: Progressing

## 2023-12-01 NOTE — TOC Initial Note (Signed)
 Transition of Care [facility]) - Initial/Assessment Note    Patient Details  Name: Joshua Schmidt MRN: [id number] Date of Birth: 1951-02-23  Transition of Care [facility]) CM/SW Contact:    Joshua JINNY Ruts, LCSW Phone Number: 12/01/2023, 2:47 PM  Clinical Narrative:                 Chart reviewed. The patient was admitted for Acute on Chronic Hypoxic respiratory failure. I spoke with the patient wife, Joshua Schmidt due to the patient being un oriented. I introduced myself, my role, and reason for consult. The patient wife reports that the patient as a primary care provider. The patient wife reports that the patient lives with only her in the home. The patient wife confirms that the patient was able to complete daily living task independently before being admitted to the hospital. The patient wife reports that he drives himself to all medical appointments but she will be assisting the patient during discharge. The patient wife confirms that the [facility] and copays are affordable. The patient wife reports that the patient has not have been admitted to a SNF or had HH in the past. The patient wife reports that the patient has no DME equipment in the home.   I then spoke about the recommendation of a SNF. The patient wife approved that refferals for SNF could be sent out and she would like a list.   I have provided a list of SNF for the patient and his wife to consult over.  I will follow up with the patient wife for SNF selection.     Barriers to Discharge: Continued Medical Work up   Patient Goals and CMS Choice            Expected Discharge Plan and Services       Living arrangements for the past 2 months: Single Family Home                                      Prior Living Arrangements/Services Living arrangements for the past 2 months: Single Family Home Lives with:: Spouse Patient language and need for interpreter reviewed:: Yes        Need  for Family Participation in Patient Care: Yes (Comment)     Criminal Activity/Legal Involvement Pertinent to Current Situation/Hospitalization: No - Comment as needed  Activities of Daily Living   ADL Screening (condition at time of admission) Independently performs ADLs?: Yes (appropriate for developmental age) Is the patient deaf or have difficulty hearing?: No Does the patient have difficulty seeing, even when wearing glasses/contacts?: No Does the patient have difficulty concentrating, remembering, or making decisions?: No  Permission Sought/Granted   Permission granted to share information with : Yes, Release of Information Signed        Permission granted to share info w Relationship: Spouse  Permission granted to share info w Contact Information: Joshua Schmidt: [phone]  Emotional Assessment       Orientation: : Fluctuating Orientation (Suspected and/or reported Sundowners) Alcohol / Substance Use: Not Applicable Psych Involvement: No (comment)  Admission diagnosis:  COPD exacerbation (HCC) [J44.1] Hypercarbic cerebral edema (HCC) [G93.6] Altered mental status, unspecified altered mental status type [R41.82] Acute on chronic respiratory failure with hypoxia and hypercapnia (HCC) [J96.21, J96.22] Acute on chronic congestive heart failure, unspecified heart failure type (HCC) [I50.9] Acute on chronic hypoxic respiratory failure (HCC) [  J96.21] Patient Active Problem List   Diagnosis Date Noted   Acute on chronic hypoxic respiratory failure (HCC) 11/28/2023   Acute on chronic respiratory failure with hypoxia and hypercapnia (HCC) 11/28/2023   COPD (chronic obstructive pulmonary disease) (HCC) 11/06/2022   Chronic respiratory failure with hypoxia (HCC) 11/06/2022   Volume overload state of heart 10/05/2022   Acute on chronic diastolic CHF (congestive heart failure) (HCC) 09/28/2022   Lung nodule 09/28/2022   Myocardial injury 09/28/2022   Acute respiratory failure  with hypoxia (HCC) 09/28/2022   HLD (hyperlipidemia) 09/28/2022   HTN (hypertension) 09/28/2022   COPD exacerbation (HCC) 09/28/2022   CAD (coronary artery disease) 09/28/2022   DVT (deep venous thrombosis) (HCC) 09/28/2022   Sleep apnea 06/10/2022   History of adenomatous polyp of colon 03/19/2022   Adenomatous polyp of descending colon 03/19/2022   Traumatic tear of left rotator cuff 02/09/2022   Aneurysm of ascending aorta without rupture (HCC) 02/09/2022   Iron deficiency 10/22/2021   Varicose veins with pain 05/04/2021   Chronic venous insufficiency 05/04/2021   Acute deep vein thrombosis (DVT) of right lower extremity (HCC) 12/03/2020   Encounter for annual physical exam 09/06/2020   Alcohol abuse 09/06/2020   Bruit of left carotid artery 08/30/2020   Erythrocytosis 12/01/2018   Former smoker 10/27/2018   Sebaceous cyst 11/08/2015   PCP:  Joshua Fend, DO Pharmacy:   [id number] [place] Schmidt, [place] - [address] [place] [address] Phone: [phone] Fax: [phone]  [facility] Delivery) - [place], [place] - [address] [facility] [address] [place] [address] [place] [address] Phone: [phone] Fax: [phone]  [facility] Delivery - [place], [place] - [address] [place] [address] Phone: [phone] Fax: [phone]     Social Drivers of Health (SDOH) Social History: SDOH Screenings   Food Insecurity: No Food Insecurity (10/21/2023)  Housing: Unknown (10/21/2023)  Transportation Needs: No Transportation Needs (10/21/2023)  Utilities: Not At Risk (10/21/2023)  Alcohol Screen: Low Risk  (10/21/2023)  Depression (PHQ2-9): Low Risk  (10/21/2023)  Financial Resource Strain: Low Risk  (10/21/2023)  Physical Activity: Sufficiently Active (10/21/2023)  Social Connections: Moderately Integrated (10/21/2023)  Stress: No Stress Concern Present (10/21/2023)   Tobacco Use: Medium Risk (11/28/2023)  Health Literacy: Adequate Health Literacy (10/21/2023)   SDOH Interventions:     Readmission Risk Interventions     No data to display

## 2023-12-01 NOTE — Evaluation (Signed)
 Physical Therapy Evaluation Patient Details Name: Joshua Schmidt MRN: [id number] DOB: 09/26/1950 Today's Date: 12/01/2023  History of Present Illness  Pt admitted to [facility] on 11/28/23 for c/o hypoxia and AMS secondary to acute on chronic hypoxic respiratory failure. Intubated 8/17-8/19 for airway protection and persistent hypoxia.TTE with diastolic dysfunction suggesting HFpEF. Suspect mild elevation in troponin secondary to non-ischemic myocardial injury from stress of COPD exacerbation and respiratory failure. Significant PMH includes: hx smoking, hx DVT, chronic venous insufficiency, AAA, acute/chronic dCHF, HLD, HTN, COPD (2L at baseline), CAD.   Clinical Impression  Pt received in supine and is agreeable for PT eval. Questionable historian of health as he is not fully oriented and presents with intermittent tangential speech. At baseline, pt is IND for ADL's, ambulation without AD, medication management, and splits IADL's with spouse.   Pt presents with impaired processing, AMS, increased O2 dependence from baseline, functional weakness, decreased gross balance, decreased activity tolerance, impaired safety awareness, gait deficits, resulting in impaired functional mobility from baseline. Due to deficits, pt required supervision for bed mobility, mod assist for transfers, and mod assist to ambulate 72ft EOB>recliner with RW. Increased assist and multimodal cues required for OOB mobility secondary to impaired processing, decreased balance, and functional weakness. Able to wean down to 4L during mobility with SpO2 at therapeutic range.  Deficits limit the pt's ability to safely and independently perform ADL's, transfer, and ambulate. Pt will benefit from acute skilled PT services to address deficits for return to baseline function. Pt will benefit from post acute therapy services to address deficits for return to baseline function.         If plan is discharge home, recommend the following: A lot  of help with walking and/or transfers;A little help with bathing/dressing/bathroom;Help with stairs or ramp for entrance;Assist for transportation;Assistance with cooking/housework   Can travel by private vehicle   Yes    Equipment Recommendations  (defer to post acute)     Functional Status Assessment Patient has had a recent decline in their functional status and demonstrates the ability to make significant improvements in function in a reasonable and predictable amount of time.     Precautions / Restrictions Precautions Precautions: Fall Restrictions Weight Bearing Restrictions Per Provider Order: No      Mobility  Bed Mobility Overal bed mobility: Needs Assistance Bed Mobility: Supine to Sit     Supine to sit: Supervision     General bed mobility comments: for safety to sit EOB, HOB semi-elevated, use of BUE for support    Transfers Overall transfer level: Needs assistance   Transfers: Sit to/from Stand Sit to Stand: Mod assist           General transfer comment: for power to stand from EOB without AD, use of BUE for support, increased reliance on momentum, posterior LE bracing on bed for steadying due to posterior bias    Ambulation/Gait Ambulation/Gait assistance: Mod assist Gait Distance (Feet): 3 Feet           General Gait Details: for balance and RW management to take multiple steps from EOB>recliner with RW; max multimodal cues for safety, sequencing, RW management/proximity     Balance Overall balance assessment: Needs assistance Sitting-balance support: Bilateral upper extremity supported, Feet supported Sitting balance-Leahy Scale: Fair     Standing balance support: Reliant on assistive device for balance, During functional activity Standing balance-Leahy Scale: Poor Standing balance comment: posterior LOB without UE support in standing, exacerbated by lateral weight shifts and static marching.  Improved with use of RW, but continued to  require increased assist for balance due to unsteadiness with dynamic movement. limited balance righting reactive strategies.                             Pertinent Vitals/Pain Pain Assessment Pain Assessment: No/denies pain    Home Living Family/patient expects to be discharged to:: Private residence Living Arrangements: Spouse/significant other Available Help at Discharge: Family;Available PRN/intermittently Type of Home: House Home Access: Stairs to enter Entrance Stairs-Rails: Doctor, general practice of Steps: 3   Home Layout: One level Home Equipment: Grab bars - tub/shower Additional Comments: walking stick; 2L at baseline    Prior Function Prior Level of Function : Patient poor historian/Family not available             Mobility Comments: Questionable historian of health due to confusion. Reports being IND for amb without AD; previous PT note states walking stick for walks. Denies falls. ADLs Comments: IND with ADL's and medication management; splits IADL's with spouse     Extremity/Trunk Assessment   Upper Extremity Assessment Upper Extremity Assessment: Overall WFL for tasks assessed    Lower Extremity Assessment Lower Extremity Assessment: Generalized weakness       Communication   Communication Communication: No apparent difficulties    Cognition Arousal: Alert Behavior During Therapy: WFL for tasks assessed/performed   PT - Cognitive impairments: Orientation, Memory, Sequencing, Problem solving, Safety/Judgement   Orientation impairments: Person, Place (oriented to month)                   PT - Cognition Comments: increased cueing for attention to task and sequencing Following commands: Intact       Cueing Cueing Techniques: Verbal cues, Tactile cues, Visual cues     General Comments General comments (skin integrity, edema, etc.): able to wean to 4L from 6L, SpO2 >/= 90%; RR elevated; denies  dizziness/lightheadedness    Exercises Other Exercises Other Exercises: Participates in bed mobility, transfers, and minimal gait. Educated re: PT role/POC, DC recommendations, current limitations, O2 weaning, importance of therapy, call for help, OOB to chair, safety with functional mobility, benefits of DME use.   Assessment/Plan    PT Assessment Patient needs continued PT services  PT Problem List Decreased strength;Decreased activity tolerance;Decreased balance;Decreased mobility;Cardiopulmonary status limiting activity;Decreased safety awareness;Decreased cognition       PT Treatment Interventions DME instruction;Gait training;Stair training;Functional mobility training;Therapeutic activities;Therapeutic exercise;Balance training;Neuromuscular re-education    PT Goals (Current goals can be found in the Care Plan section)  Acute Rehab PT Goals Patient Stated Goal: go home PT Goal Formulation: With patient Time For Goal Achievement: 12/15/23 Potential to Achieve Goals: Fair    Frequency Min 2X/week        AM-PAC PT 6 Clicks Mobility  Outcome Measure Help needed turning from your back to your side while in a flat bed without using bedrails?: A Little Help needed moving from lying on your back to sitting on the side of a flat bed without using bedrails?: A Little Help needed moving to and from a bed to a chair (including a wheelchair)?: A Lot Help needed standing up from a chair using your arms (e.g., wheelchair or bedside chair)?: A Lot Help needed to walk in hospital room?: A Lot Help needed climbing 3-5 steps with a railing? : A Lot 6 Click Score: 14    End of Session Equipment Utilized During Treatment: Gait belt;Oxygen  (  4L) Activity Tolerance: Patient tolerated treatment well Patient left: in chair;with call bell/phone within reach (no chair alarm on unit) Nurse Communication: Mobility status PT Visit Diagnosis: Unsteadiness on feet (R26.81);Muscle weakness  (generalized) (M62.81);Difficulty in walking, not elsewhere classified (R26.2)    Time: 8957-8886 PT Time Calculation (min) (ACUTE ONLY): 31 min   Charges:   PT Evaluation $PT Eval Moderate Complexity: 1 Mod PT Treatments $Therapeutic Activity: 8-22 mins PT General Charges $$ ACUTE PT VISIT: 1 Visit        Camie [name] Kluver, PT, DPT 12:01 PM,12/01/23 Physical Therapist - [facility]

## 2023-12-01 NOTE — Care Management Important Message (Signed)
 Important Message  Patient Details  Name: Joshua Schmidt MRN: [id number] Date of Birth: 02/26/1951   Important Message Given:  Yes - Medicare IM     [name] 12/01/2023, 12:29 PM

## 2023-12-01 NOTE — Progress Notes (Signed)
 SATURATION QUALIFICATIONS: (This note is used to comply with regulatory documentation for home oxygen )  Patient Saturations on Room Air at Rest = 84%  Patient Saturations on Room Air while Ambulating = 84%  Patient Saturations on 6 Liters of oxygen  while Ambulating = 91%  Please briefly explain why patient needs home oxygen :

## 2023-12-02 DIAGNOSIS — J9621 Acute and chronic respiratory failure with hypoxia: Secondary | ICD-10-CM | POA: Diagnosis not present

## 2023-12-02 LAB — CBC
HCT: 53.9 % — ABNORMAL HIGH (ref 39.0–52.0)
Hemoglobin: 16.6 g/dL (ref 13.0–17.0)
MCH: 30.7 pg (ref 26.0–34.0)
MCHC: 30.8 g/dL (ref 30.0–36.0)
MCV: 99.6 fL (ref 80.0–100.0)
Platelets: 170 K/uL (ref 150–400)
RBC: 5.41 MIL/uL (ref 4.22–5.81)
RDW: 14.3 % (ref 11.5–15.5)
WBC: 9.3 K/uL (ref 4.0–10.5)
nRBC: 0 % (ref 0.0–0.2)

## 2023-12-02 LAB — RENAL FUNCTION PANEL
Albumin: 3 g/dL — ABNORMAL LOW (ref 3.5–5.0)
Anion gap: 9 (ref 5–15)
BUN: 24 mg/dL — ABNORMAL HIGH (ref 8–23)
CO2: 32 mmol/L (ref 22–32)
Calcium: 9 mg/dL (ref 8.9–10.3)
Chloride: 99 mmol/L (ref 98–111)
Creatinine, Ser: 0.62 mg/dL (ref 0.61–1.24)
GFR, Estimated: >60 mL/min (ref >60)
Glucose, Bld: 158 mg/dL — ABNORMAL HIGH (ref 70–99)
Phosphorus: 2.6 mg/dL (ref 2.5–4.6)
Potassium: 4.9 mmol/L (ref 3.5–5.1)
Sodium: 140 mmol/L (ref 135–145)

## 2023-12-02 LAB — GLUCOSE, CAPILLARY
Glucose-Capillary: 115 mg/dL — ABNORMAL HIGH (ref 70–99)
Glucose-Capillary: 122 mg/dL — ABNORMAL HIGH (ref 70–99)
Glucose-Capillary: 128 mg/dL — ABNORMAL HIGH (ref 70–99)
Glucose-Capillary: 158 mg/dL — ABNORMAL HIGH (ref 70–99)
Glucose-Capillary: 169 mg/dL — ABNORMAL HIGH (ref 70–99)
Glucose-Capillary: 174 mg/dL — ABNORMAL HIGH (ref 70–99)

## 2023-12-02 LAB — MAGNESIUM: Magnesium: 2.2 mg/dL (ref 1.7–2.4)

## 2023-12-02 MED ORDER — PREDNISONE 10 MG PO TABS
40.0000 mg | ORAL_TABLET | Freq: Every day | ORAL | Status: DC
  Administered 2023-12-03 – 2023-12-04 (×2): 40 mg via ORAL
  Filled 2023-12-02 (×2): qty 4

## 2023-12-02 MED ORDER — BUDESON-GLYCOPYRROL-FORMOTEROL 160-9-4.8 MCG/ACT IN AERO
2.0000 | INHALATION_SPRAY | Freq: Two times a day (BID) | RESPIRATORY_TRACT | Status: DC
  Administered 2023-12-03 – 2023-12-04 (×3): 2 via RESPIRATORY_TRACT
  Filled 2023-12-02: qty 5.9

## 2023-12-02 MED ORDER — METHYLPREDNISOLONE SODIUM SUCC 40 MG IJ SOLR
40.0000 mg | Freq: Once | INTRAMUSCULAR | Status: AC
  Administered 2023-12-02: 40 mg via INTRAVENOUS
  Filled 2023-12-02: qty 1

## 2023-12-02 MED ORDER — LOSARTAN POTASSIUM 50 MG PO TABS
50.0000 mg | ORAL_TABLET | Freq: Every day | ORAL | Status: DC
  Administered 2023-12-03 – 2023-12-04 (×2): 50 mg via ORAL
  Filled 2023-12-02 (×2): qty 1

## 2023-12-02 MED ORDER — ENOXAPARIN SODIUM 60 MG/0.6ML IJ SOSY
50.0000 mg | PREFILLED_SYRINGE | INTRAMUSCULAR | Status: DC
  Administered 2023-12-02 – 2023-12-03 (×2): 50 mg via SUBCUTANEOUS
  Filled 2023-12-02 (×3): qty 0.6

## 2023-12-02 NOTE — Plan of Care (Signed)
  Problem: Activity: Goal: Ability to tolerate increased activity will improve Outcome: Progressing   Problem: Respiratory: Goal: Ability to maintain a clear airway and adequate ventilation will improve Outcome: Progressing   Problem: Role Relationship: Goal: Method of communication will improve Outcome: Progressing   Problem: Education: Goal: Knowledge of General Education information will improve Description: Including pain rating scale, medication(s)/side effects and non-pharmacologic comfort measures Outcome: Progressing   Problem: Health Behavior/Discharge Planning: Goal: Ability to manage health-related needs will improve Outcome: Progressing   Problem: Clinical Measurements: Goal: Ability to maintain clinical measurements within normal limits will improve Outcome: Progressing Goal: Will remain free from infection Outcome: Progressing Goal: Diagnostic test results will improve Outcome: Progressing Goal: Respiratory complications will improve Outcome: Progressing Goal: Cardiovascular complication will be avoided Outcome: Progressing   Problem: Activity: Goal: Risk for activity intolerance will decrease Outcome: Progressing   Problem: Nutrition: Goal: Adequate nutrition will be maintained Outcome: Progressing   Problem: Coping: Goal: Level of anxiety will decrease Outcome: Progressing   Problem: Elimination: Goal: Will not experience complications related to bowel motility Outcome: Progressing Goal: Will not experience complications related to urinary retention Outcome: Progressing   Problem: Pain Managment: Goal: General experience of comfort will improve and/or be controlled Outcome: Progressing   Problem: Safety: Goal: Ability to remain free from injury will improve Outcome: Progressing   Problem: Skin Integrity: Goal: Risk for impaired skin integrity will decrease Outcome: Progressing

## 2023-12-02 NOTE — Progress Notes (Deleted)
  Progress Note   Patient: Joshua Schmidt FMW:[id number] DOB: July 18, 1950 DOA: 11/28/2023     4 DOS: the patient was seen and examined on 12/02/2023   Brief hospital course:  Assessment and Plan: Acute on chronic hypoxic and hypercapnic RF  AE of COPD   S/p azithromycin  and unasyn .  Continue two more days of augmentin .  Transition to PO steroids Bipap at night Continue home Breztri     HFpEF  Appears euvolemic    HTN CAD s/p MI  HLD Non ischemic myocardial injury  Continue  statin Coreg  aspirin  Resume losartan  and HCTZ as able  DVT not on AC  OSA Bipap, consult TOC for help with securing this at home.   Infrarenal AAA Monitored outpatient  RCIA/LCIA Monitored outpatient stable.   TUD  Unclear if active smoker.      Subjective: Feels better not confused.   Physical Exam: Vitals:   12/02/23 1500 12/02/23 1600 12/02/23 1700 12/02/23 1800  BP: 111/65 (!) 151/92 (!) 147/80 (!) 156/110  Pulse: 68 68 68 65  Resp: (!) 27 14 19 16   Temp:  98.4 F (36.9 C)    TempSrc:  Oral    SpO2: 90% 92% 92% 94%  Weight:      Height:       Physical Exam  Constitutional: In no distress.  Cardiovascular: Normal rate, regular rhythm. Pedal edema   Pulmonary: Non labored breathing on [place] no wheezing or rales.   Abdominal: Soft. Normal bowel sounds. Non distended and non tender Musculoskeletal: Normal range of motion.     Neurological: Alert and oriented to person, place, and time. Non focal  Skin: Skin is warm and dry.    Data Reviewed:     Latest Ref Rng & Units 12/02/2023    4:31 AM 12/01/2023    5:58 AM 11/30/2023    5:43 AM  CBC  WBC 4.0 - 10.5 K/uL 9.3  9.5  9.5   Hemoglobin 13.0 - 17.0 g/dL 83.3  83.6  83.6   Hematocrit 39.0 - 52.0 % 53.9  51.2  50.3   Platelets 150 - 400 K/uL 170  178  199       Latest Ref Rng & Units 12/02/2023    4:31 AM 12/01/2023    5:58 AM 11/30/2023    5:43 AM  BMP  Glucose 70 - 99 mg/dL 841  878  844   BUN 8 - 23 mg/dL 24  20  29     Creatinine 0.61 - 1.24 mg/dL 9.37  9.26  9.07   Sodium 135 - 145 mmol/L 140  141  138   Potassium 3.5 - 5.1 mmol/L 4.9  4.4  3.8   Chloride 98 - 111 mmol/L 99  98  93   CO2 22 - 32 mmol/L 32  34  34   Calcium  8.9 - 10.3 mg/dL 9.0  8.7  8.6      Family Communication: spoke with wife via phone.   Disposition: Status is: Inpatient Remains inpatient appropriate because: respiratory status.   Planned Discharge Destination: Pending    Time spent: 35 minutes  Author: Alban Pepper, MD 12/02/2023 6:17 PM  For on call review [email or web address].

## 2023-12-02 NOTE — NC FL2 (Signed)
 [place]  MEDICAID FL2 LEVEL OF CARE FORM     IDENTIFICATION  Patient Name: Joshua Schmidt Birthdate: 06-Sep-1950 Sex: male Admission Date (Current Location): 11/28/2023  [facility] and [place] Number:  Chiropodist and Address:  [facility], [address] [place], [place] [address]      Provider Number: [id number]  Attending Physician Name and Address:  Franchot Novel, MD  Relative Name and Phone Number:  Jaremy, Nosal (Spouse)  [phone] (Mobile)    Current Level of Care: Hospital Recommended Level of Care: Skilled Nursing Facility Prior Approval Number:    Date Approved/Denied:   PASRR Number: [id number]  Discharge Plan: SNF    Current Diagnoses: Patient Active Problem List   Diagnosis Date Noted   Acute on chronic hypoxic respiratory failure (HCC) 11/28/2023   Acute on chronic respiratory failure with hypoxia and hypercapnia (HCC) 11/28/2023   COPD (chronic obstructive pulmonary disease) (HCC) 11/06/2022   Chronic respiratory failure with hypoxia (HCC) 11/06/2022   Volume overload state of heart 10/05/2022   Acute on chronic diastolic CHF (congestive heart failure) (HCC) 09/28/2022   Lung nodule 09/28/2022   Myocardial injury 09/28/2022   Acute respiratory failure with hypoxia (HCC) 09/28/2022   HLD (hyperlipidemia) 09/28/2022   HTN (hypertension) 09/28/2022   COPD exacerbation (HCC) 09/28/2022   CAD (coronary artery disease) 09/28/2022   DVT (deep venous thrombosis) (HCC) 09/28/2022   Sleep apnea 06/10/2022   History of adenomatous polyp of colon 03/19/2022   Adenomatous polyp of descending colon 03/19/2022   Traumatic tear of left rotator cuff 02/09/2022   Aneurysm of ascending aorta without rupture (HCC) 02/09/2022   Iron deficiency 10/22/2021   Varicose veins with pain 05/04/2021   Chronic venous insufficiency 05/04/2021   Acute deep vein thrombosis (DVT) of right lower extremity (HCC) 12/03/2020   Encounter for  annual physical exam 09/06/2020   Alcohol abuse 09/06/2020   Bruit of left carotid artery 08/30/2020   Erythrocytosis 12/01/2018   Former smoker 10/27/2018   Sebaceous cyst 11/08/2015    Orientation RESPIRATION BLADDER Height & Weight     Self, Situation  O2, Other (Comment) (Nasal Cannula 5 L. Bipap QHS: 12/6 at 40%.) Incontinent Weight: 230 lb 6.1 oz (104.5 kg) Height:  6' (182.9 cm)  BEHAVIORAL SYMPTOMS/MOOD NEUROLOGICAL BOWEL NUTRITION STATUS   (None)  (None) Continent Diet (Heart healthy)  AMBULATORY STATUS COMMUNICATION OF NEEDS Skin   Limited Assist Verbally Skin abrasions, Bruising, Other (Comment) (Erythema/redness.)                       Personal Care Assistance Level of Assistance  Bathing, Feeding, Dressing Bathing Assistance: Limited assistance Feeding assistance: Limited assistance Dressing Assistance: Limited assistance     Functional Limitations Info  Sight, Hearing, Speech Sight Info: Adequate Hearing Info: Adequate Speech Info: Adequate (Nods/gestures appropriately.)    SPECIAL CARE FACTORS FREQUENCY  PT (By licensed PT)     PT Frequency: 5 x week              Contractures Contractures Info: Not present    Additional Factors Info  Code Status, Allergies Code Status Info: Full code Allergies Info: NKDA           Current Medications (12/02/2023):  This is the current hospital active medication list Current Facility-Administered Medications  Medication Dose Route Frequency Provider Last Rate Last Admin   acetaminophen  (TYLENOL ) tablet 650 mg  650 mg Oral Q6H PRN Rust-Chester, Jenita CROME, NP  Or   acetaminophen  (TYLENOL ) suppository 650 mg  650 mg Rectal Q6H PRN Rust-Chester, Britton L, NP       amoxicillin -clavulanate (AUGMENTIN ) 875-125 MG per tablet 1 tablet  1 tablet Oral Q12H Franchot Novel, MD   1 tablet at 12/02/23 9150   aspirin  chewable tablet 81 mg  81 mg Oral Daily Franchot Novel, MD   81 mg at 12/02/23 0850    atorvastatin  (LIPITOR) tablet 20 mg  20 mg Oral Daily Franchot Novel, MD   20 mg at 12/02/23 9149   budesonide  (PULMICORT ) nebulizer solution 0.25 mg  0.25 mg Nebulization BID Rust-Chester, Britton L, NP   0.25 mg at 12/02/23 0710   carvedilol  (COREG ) tablet 12.5 mg  12.5 mg Oral BID Franchot Novel, MD   12.5 mg at 12/02/23 9149   Chlorhexidine  Gluconate Cloth 2 % PADS 6 each  6 each Topical Daily Rust-Chester, Jenita CROME, NP   6 each at 12/02/23 0851   enoxaparin  (LOVENOX ) injection 50 mg  50 mg Subcutaneous Q24H Clair Marolyn NOVAK, RPH       feeding supplement (ENSURE PLUS HIGH PROTEIN) liquid 237 mL  237 mL Oral TID BM Franchot Novel, MD   237 mL at 12/02/23 0851   folic acid  (FOLVITE ) tablet 1 mg  1 mg Oral Daily Rust-Chester, Britton L, NP   1 mg at 12/02/23 0851   ipratropium-albuterol  (DUONEB) 0.5-2.5 (3) MG/3ML nebulizer solution 3 mL  3 mL Nebulization Q4H PRN Rust-Chester, Jenita CROME, NP       ipratropium-albuterol  (DUONEB) 0.5-2.5 (3) MG/3ML nebulizer solution 3 mL  3 mL Nebulization TID Isadora Hose, MD   3 mL at 12/02/23 0710   methylPREDNISolone  sodium succinate (SOLU-MEDROL ) 40 mg/mL injection 40 mg  40 mg Intravenous Q24H Isadora Hose, MD   40 mg at 12/01/23 2336   multivitamin with minerals tablet 1 tablet  1 tablet Oral Daily Rust-Chester, Jenita CROME, NP   1 tablet at 12/02/23 0850   ondansetron  (ZOFRAN ) injection 4 mg  4 mg Intravenous Q6H PRN Hugelmeyer, Alexis, DO       Oral care mouth rinse  15 mL Mouth Rinse Q2H Rust-Chester, Britton L, NP   15 mL at 12/02/23 0851   Oral care mouth rinse  15 mL Mouth Rinse PRN Rust-Chester, Jenita CROME, NP       senna-docusate (Senokot-S) tablet 1 tablet  1 tablet Oral QHS PRN Hugelmeyer, Alexis, DO       sodium chloride  flush (NS) 0.9 % injection 3 mL  3 mL Intravenous Q12H Hugelmeyer, Alexis, DO   3 mL at 12/02/23 0852   [START ON 12/03/2023] thiamine  (VITAMIN B1) 250 mg in sodium chloride  0.9 % 50 mL IVPB  250 mg Intravenous Q24H Keene,  Jeremiah D, NP       Followed by   NOREEN ON 12/05/2023] thiamine  (VITAMIN B1) injection 100 mg  100 mg Intravenous Q24H Keene, Jeremiah D, NP       umeclidinium-vilanterol (ANORO ELLIPTA ) 62.5-25 MCG/ACT 1 puff  1 puff Inhalation Daily Franchot Novel, MD   1 puff at 12/02/23 0848     Discharge Medications: Please see discharge summary for a list of discharge medications.  Relevant Imaging Results:  Relevant Lab Results:   Additional Information SS#: [id number]  Lauraine JAYSON Carpen, LCSW

## 2023-12-02 NOTE — Progress Notes (Addendum)
  Progress Note   Patient: AMAL SAIKI FMW:[id number] DOB: [birth year] DOA: 11/28/2023     4 DOS: the patient was seen and examined on 12/02/2023   Brief hospital course:  Assessment and Plan: Acute on chronic hypoxic and hypercapnic RF  AE of COPD  S/p azithromycin  and unasyn .  Continue two more days of augmentin .  Transition to PO steroids Bipap at night Continue home Breztri    AMS Patient confused due to hypercapnia. CT head w/o contrast with no acute abnormality. No evidence of swelling though without contrast.   HFpEF  Appears euvolemic    HTN CAD s/p MI  HLD Chronic Non ischemic myocardial injury in the setting of severe COPD Continue  statin Coreg  aspirin  Resume losartan  and HCTZ as able  DVT not on AC  OSA Bipap, consult TOC for help with securing this at home.   Infrarenal AAA Monitored outpatient  RCIA/LCIA Monitored outpatient stable.   TUD  Unclear if active smoker.      Subjective: Feels better not confused.   Physical Exam: Vitals:   12/02/23 1500 12/02/23 1600 12/02/23 1700 12/02/23 1800  BP: 111/65 (!) 151/92 (!) 147/80 (!) 156/110  Pulse: 68 68 68 65  Resp: (!) 27 14 19 16   Temp:  98.4 F (36.9 C)    TempSrc:  Oral    SpO2: 90% 92% 92% 94%  Weight:      Height:       Physical Exam  Constitutional: In no distress.  Cardiovascular: Normal rate, regular rhythm. Pedal edema   Pulmonary: Non labored breathing on [place] no wheezing or rales.   Abdominal: Soft. Normal bowel sounds. Non distended and non tender Musculoskeletal: Normal range of motion.     Neurological: Alert and oriented to person, place, and time. Non focal  Skin: Skin is warm and dry.    Data Reviewed:     Latest Ref Rng & Units 12/02/2023    4:31 AM 12/01/2023    5:58 AM 11/30/2023    5:43 AM  CBC  WBC 4.0 - 10.5 K/uL 9.3  9.5  9.5   Hemoglobin 13.0 - 17.0 g/dL 83.3  83.6  83.6   Hematocrit 39.0 - 52.0 % 53.9  51.2  50.3   Platelets 150 - 400 K/uL 170  178  199        Latest Ref Rng & Units 12/02/2023    4:31 AM 12/01/2023    5:58 AM 11/30/2023    5:43 AM  BMP  Glucose 70 - 99 mg/dL 841  878  844   BUN 8 - 23 mg/dL 24  20  29    Creatinine 0.61 - 1.24 mg/dL 9.37  9.26  9.07   Sodium 135 - 145 mmol/L 140  141  138   Potassium 3.5 - 5.1 mmol/L 4.9  4.4  3.8   Chloride 98 - 111 mmol/L 99  98  93   CO2 22 - 32 mmol/L 32  34  34   Calcium  8.9 - 10.3 mg/dL 9.0  8.7  8.6      Family Communication: spoke with wife via phone.   Disposition: Status is: Inpatient Remains inpatient appropriate because: respiratory status.   Planned Discharge Destination: Pending    Time spent: 35 minutes  Author: Alban Pepper, MD 12/02/2023 6:27 PM  For on call review [email or web address].

## 2023-12-03 DIAGNOSIS — J9621 Acute and chronic respiratory failure with hypoxia: Secondary | ICD-10-CM | POA: Diagnosis not present

## 2023-12-03 LAB — GLUCOSE, CAPILLARY
Glucose-Capillary: 110 mg/dL — ABNORMAL HIGH (ref 70–99)
Glucose-Capillary: 96 mg/dL (ref 70–99)
Glucose-Capillary: 120 mg/dL — ABNORMAL HIGH (ref 70–99)
Glucose-Capillary: 183 mg/dL — ABNORMAL HIGH (ref 70–99)
Glucose-Capillary: 157 mg/dL — ABNORMAL HIGH (ref 70–99)
Glucose-Capillary: 81 mg/dL (ref 70–99)

## 2023-12-03 LAB — RENAL FUNCTION PANEL
Albumin: 3 g/dL — ABNORMAL LOW (ref 3.5–5.0)
Anion gap: 8 (ref 5–15)
BUN: 22 mg/dL (ref 8–23)
CO2: 37 mmol/L — ABNORMAL HIGH (ref 22–32)
Calcium: 9.4 mg/dL (ref 8.9–10.3)
Chloride: 95 mmol/L — ABNORMAL LOW (ref 98–111)
Creatinine, Ser: 0.68 mg/dL (ref 0.61–1.24)
GFR, Estimated: >60 mL/min (ref >60)
Glucose, Bld: 107 mg/dL — ABNORMAL HIGH (ref 70–99)
Phosphorus: 3.2 mg/dL (ref 2.5–4.6)
Potassium: 4.5 mmol/L (ref 3.5–5.1)
Sodium: 140 mmol/L (ref 135–145)

## 2023-12-03 LAB — CBC
HCT: 51.3 % (ref 39.0–52.0)
Hemoglobin: 16.3 g/dL (ref 13.0–17.0)
MCH: 31 pg (ref 26.0–34.0)
MCHC: 31.8 g/dL (ref 30.0–36.0)
MCV: 97.5 fL (ref 80.0–100.0)
Platelets: 168 K/uL (ref 150–400)
RBC: 5.26 MIL/uL (ref 4.22–5.81)
RDW: 14 % (ref 11.5–15.5)
WBC: 8 K/uL (ref 4.0–10.5)
nRBC: 0 % (ref 0.0–0.2)

## 2023-12-03 LAB — MAGNESIUM: Magnesium: 2.3 mg/dL (ref 1.7–2.4)

## 2023-12-03 MED ORDER — METOPROLOL SUCCINATE ER 50 MG PO TB24
100.0000 mg | ORAL_TABLET | Freq: Every day | ORAL | Status: DC
  Administered 2023-12-03 – 2023-12-04 (×2): 100 mg via ORAL
  Filled 2023-12-03 (×2): qty 2

## 2023-12-03 MED ORDER — DAPAGLIFLOZIN PROPANEDIOL 10 MG PO TABS
10.0000 mg | ORAL_TABLET | Freq: Every day | ORAL | Status: DC
  Administered 2023-12-04: 10 mg via ORAL
  Filled 2023-12-03: qty 1

## 2023-12-03 MED ORDER — HYDROCHLOROTHIAZIDE 25 MG PO TABS: 25.0000 mg | ORAL_TABLET | Freq: Every day | ORAL | Status: DC

## 2023-12-03 MED ORDER — POLYETHYLENE GLYCOL 3350 17 G PO PACK: 17.0000 g | PACK | Freq: Every day | ORAL | Status: DC

## 2023-12-03 MED ORDER — LACTULOSE 10 GM/15ML PO SOLN
30.0000 g | Freq: Once | ORAL | Status: AC
  Administered 2023-12-03: 30 g via ORAL
  Filled 2023-12-03: qty 60

## 2023-12-03 MED ORDER — HYDROCHLOROTHIAZIDE 25 MG PO TABS
25.0000 mg | ORAL_TABLET | Freq: Every day | ORAL | Status: DC
  Administered 2023-12-03 – 2023-12-04 (×2): 25 mg via ORAL
  Filled 2023-12-03 (×2): qty 1

## 2023-12-03 NOTE — Progress Notes (Signed)
 Progress Note   Patient: Joshua Schmidt FMW:[id number] DOB: Apr 11, 1951 DOA: 11/28/2023     5 DOS: the patient was seen and examined on 12/03/2023   Brief hospital course:  Assessment and Plan: Acute on chronic hypoxic and hypercapnic RF  AE of COPD  Metabolic alkalosis  S/p azithromycin  and unasyn .  Metabolic alkalosis, compensatory for chronic Hypercapnia. Continue two more days of augmentin .  Bipap at night Continue home Breztri   - Case management working to get patient BiPAP on discharge  AMS resolved Patient confused due to hypercapnia. CT head w/o contrast with no acute abnormality. No evidence of swelling/edema on imaging.   HFpEF  Some lower extremity edema.  Not currently on spironolactone or SGLT 2 inhibitor. Start Farxiga    HTN CAD s/p MI  HLD Chronic Non ischemic myocardial injury in the setting of severe COPD Continue  statin aspirin  Coreg  transitioned to metoprolol  succinate Continue losartan  Resume hydrochlorothiazide   OSA Bipap at night.  Case management working to get BiPAP prior to discharge.  Infrarenal AAA Monitored outpatient  RCIA/LCIA Monitored outpatient stable.   TUD  Unclear if active smoker.   DVT not on AC This is documented in chart.  Vascular surgery note from 12/2020 shows that DVT was never confirmed.  Patient had acutely worsening unilateral right lower extremity swelling.      Subjective: No issues overnight.  Physical Exam: Vitals:   12/03/23 0900 12/03/23 1300 12/03/23 1400 12/03/23 1414  BP: 129/81 (!) 116/95 116/68   Pulse: 65 62 62   Resp: (!) 29 (!) 26    Temp:      TempSrc:      SpO2: 90% 94% 92% 92%  Weight:      Height:       P  Physical Exam  Constitutional: In no distress.  Cardiovascular: Normal rate, regular rhythm.  1+ bilateral lower extremity edema  Pulmonary: Non labored breathing on nasal cannula, no wheezing or rales.   Abdominal: Soft. Non distended and non tender Musculoskeletal: Normal  range of motion.     Neurological: Alert and oriented to person, place, and time. Non focal  Skin: Skin is warm and dry.    Data Reviewed:     Latest Ref Rng & Units 12/03/2023    4:04 AM 12/02/2023    4:31 AM 12/01/2023    5:58 AM  CBC  WBC 4.0 - 10.5 K/uL 8.0  9.3  9.5   Hemoglobin 13.0 - 17.0 g/dL 83.6  83.3  83.6   Hematocrit 39.0 - 52.0 % 51.3  53.9  51.2   Platelets 150 - 400 K/uL 168  170  178       Latest Ref Rng & Units 12/03/2023    4:04 AM 12/02/2023    4:31 AM 12/01/2023    5:58 AM  BMP  Glucose 70 - 99 mg/dL 892  841  878   BUN 8 - 23 mg/dL 22  24  20    Creatinine 0.61 - 1.24 mg/dL 9.31  9.37  9.26   Sodium 135 - 145 mmol/L 140  140  141   Potassium 3.5 - 5.1 mmol/L 4.5  4.9  4.4   Chloride 98 - 111 mmol/L 95  99  98   CO2 22 - 32 mmol/L 37  32  34   Calcium  8.9 - 10.3 mg/dL 9.4  9.0  8.7      Family Communication: None at bedside.  Updated spouse yesterday  Disposition: Status is: Inpatient Remains inpatient appropriate  because: respiratory status.   Planned Discharge Destination: Home    Time spent: 35 minutes  Author: Alban Pepper, MD 12/03/2023 4:27 PM  For on call review [email or web address].

## 2023-12-03 NOTE — Progress Notes (Signed)
 Physical Therapy Treatment Patient Details Name: Joshua Schmidt MRN: [id number] DOB: 07-25-1950 Today's Date: 12/03/2023   History of Present Illness Pt admitted to [facility] on 11/28/23 for c/o hypoxia and AMS secondary to acute on chronic hypoxic respiratory failure. Intubated 8/17-8/19 for airway protection and persistent hypoxia.TTE with diastolic dysfunction suggesting HFpEF. Suspect mild elevation in troponin secondary to non-ischemic myocardial injury from stress of COPD exacerbation and respiratory failure. Significant PMH includes: hx smoking, hx DVT, chronic venous insufficiency, AAA, acute/chronic dCHF, HLD, HTN, COPD (2L at baseline), CAD.    PT Comments  Pt up in chair ready to walk.  He is able stand and walk 150' x 2 with RW and cga x 1.  +2 assist is used to assist with monitors and O2 tank but no physically needed to walk or for safety.  Sats fluctuate during gait from mid 80's to low 90's with occasional cues to deep breathe to increase sats.  Overall does quite well with good confidence.  Discharge recommendations updated to reflect progress.     If plan is discharge home, recommend the following: A little help with bathing/dressing/bathroom;Help with stairs or ramp for entrance;Assist for transportation;Assistance with cooking/housework;A little help with walking and/or transfers   Can travel by private vehicle        Equipment Recommendations  Rolling walker (2 wheels)    Recommendations for Other Services       Precautions / Restrictions Precautions Precautions: Fall Restrictions Weight Bearing Restrictions Per Provider Order: No     Mobility  Bed Mobility               General bed mobility comments: in chair before and after Patient Response: Cooperative  Transfers Overall transfer level: Needs assistance Equipment used: Rolling walker (2 wheels) Transfers: Sit to/from Stand Sit to Stand: Modified independent (Device/Increase time)                 Ambulation/Gait     Assistive device: Rolling walker (2 wheels) Gait Pattern/deviations: Step-through pattern Gait velocity: WFL     General Gait Details: 150' x 2 with good confidence and no LOB/buckling   Stairs             Wheelchair Mobility     Tilt Bed Tilt Bed Patient Response: Cooperative  Modified Rankin (Stroke Patients Only)       Balance Overall balance assessment: Mild deficits observed, not formally tested                                          Communication Communication Communication: No apparent difficulties  Cognition Arousal: Alert Behavior During Therapy: WFL for tasks assessed/performed                             Following commands: Intact      Cueing Cueing Techniques: Verbal cues  Exercises      General Comments        Pertinent Vitals/Pain Pain Assessment Pain Assessment: No/denies pain    Home Living                          Prior Function            PT Goals (current goals can now be found in the care plan section) Progress towards PT goals: Progressing  toward goals    Frequency    Min 2X/week      PT Plan      Co-evaluation              AM-PAC PT 6 Clicks Mobility   Outcome Measure  Help needed turning from your back to your side while in a flat bed without using bedrails?: None Help needed moving from lying on your back to sitting on the side of a flat bed without using bedrails?: None Help needed moving to and from a bed to a chair (including a wheelchair)?: None Help needed standing up from a chair using your arms (e.g., wheelchair or bedside chair)?: None Help needed to walk in hospital room?: A Little Help needed climbing 3-5 steps with a railing? : A Little 6 Click Score: 22    End of Session Equipment Utilized During Treatment: Gait belt;Oxygen  Activity Tolerance: Patient tolerated treatment well Patient left: in chair;with call  bell/phone within reach;with chair alarm set Nurse Communication: Mobility status PT Visit Diagnosis: Unsteadiness on feet (R26.81);Muscle weakness (generalized) (M62.81);Difficulty in walking, not elsewhere classified (R26.2)     Time: 1040-1055 PT Time Calculation (min) (ACUTE ONLY): 15 min  Charges:    $Gait Training: 8-22 mins PT General Charges $$ ACUTE PT VISIT: 1 Visit                  Lauraine Gills, PTA 12/03/23, 11:00 AM

## 2023-12-03 NOTE — Plan of Care (Signed)
  Problem: Activity: Goal: Ability to tolerate increased activity will improve Outcome: Progressing   Problem: Respiratory: Goal: Ability to maintain a clear airway and adequate ventilation will improve Outcome: Progressing   Problem: Role Relationship: Goal: Method of communication will improve Outcome: Progressing   Problem: Education: Goal: Knowledge of General Education information will improve Description: Including pain rating scale, medication(s)/side effects and non-pharmacologic comfort measures Outcome: Progressing   Problem: Health Behavior/Discharge Planning: Goal: Ability to manage health-related needs will improve Outcome: Progressing   Problem: Clinical Measurements: Goal: Ability to maintain clinical measurements within normal limits will improve Outcome: Progressing Goal: Will remain free from infection Outcome: Progressing Goal: Diagnostic test results will improve Outcome: Progressing Goal: Respiratory complications will improve Outcome: Progressing Goal: Cardiovascular complication will be avoided Outcome: Progressing   Problem: Coping: Goal: Level of anxiety will decrease Outcome: Progressing   Problem: Elimination: Goal: Will not experience complications related to bowel motility Outcome: Progressing Goal: Will not experience complications related to urinary retention Outcome: Progressing

## 2023-12-03 NOTE — TOC Progression Note (Signed)
 Transition of Care [facility]) - Progression Note    Patient Details  Name: Joshua Schmidt MRN: [id number] Date of Birth: 24-Dec-1950  [facility]) CM/SW Contact  K'La JINNY Ruts, LCSW Phone Number: 12/03/2023, 11:21 AM  Clinical Narrative:    Chart reviewed. I spoke with the patient wife and she would a list of HH agencies. The patient will need a Bipap machine and rolling walker upon discharge. I will order DME and provide a list for the patient wife to identify [facility].      Barriers to Discharge: Continued Medical Work up               Expected Discharge Plan and Services       Living arrangements for the past 2 months: Single Family Home                                       Social Drivers of Health (SDOH) Interventions SDOH Screenings   Food Insecurity: No Food Insecurity (10/21/2023)  Housing: Unknown (10/21/2023)  Transportation Needs: No Transportation Needs (10/21/2023)  Utilities: Not At Risk (10/21/2023)  Alcohol Screen: Low Risk  (10/21/2023)  Depression (PHQ2-9): Low Risk  (10/21/2023)  Financial Resource Strain: Low Risk  (10/21/2023)  Physical Activity: Sufficiently Active (10/21/2023)  Social Connections: Moderately Integrated (10/21/2023)  Stress: No Stress Concern Present (10/21/2023)  Tobacco Use: Medium Risk (11/28/2023)  Health Literacy: Adequate Health Literacy (10/21/2023)    Readmission Risk Interventions     No data to display

## 2023-12-03 NOTE — TOC Progression Note (Signed)
 Transition of Care [facility]) - Progression Note    Patient Details  Name: Joshua Schmidt MRN: [id number] Date of Birth: 1950-09-15  Transition of Care [facility]) CM/SW Contact  K'La JINNY Ruts, LCSW Phone Number: 12/03/2023, 3:41 PM  Clinical Narrative:    Chart reviewed. DME for Bipap, Oxygen , and RW has been placed with [employer]. Now waiting for wife of the patient to identify [facility].      Barriers to Discharge: Continued Medical Work up               Expected Discharge Plan and Services       Living arrangements for the past 2 months: Single Family Home                                       Social Drivers of Health (SDOH) Interventions SDOH Screenings   Food Insecurity: No Food Insecurity (10/21/2023)  Housing: Unknown (10/21/2023)  Transportation Needs: No Transportation Needs (10/21/2023)  Utilities: Not At Risk (10/21/2023)  Alcohol Screen: Low Risk  (10/21/2023)  Depression (PHQ2-9): Low Risk  (10/21/2023)  Financial Resource Strain: Low Risk  (10/21/2023)  Physical Activity: Sufficiently Active (10/21/2023)  Social Connections: Moderately Integrated (10/21/2023)  Stress: No Stress Concern Present (10/21/2023)  Tobacco Use: Medium Risk (11/28/2023)  Health Literacy: Adequate Health Literacy (10/21/2023)    Readmission Risk Interventions     No data to display

## 2023-12-04 DIAGNOSIS — J9621 Acute and chronic respiratory failure with hypoxia: Secondary | ICD-10-CM | POA: Diagnosis not present

## 2023-12-04 LAB — CBC
HCT: 51.4 % (ref 39.0–52.0)
Hemoglobin: 16.2 g/dL (ref 13.0–17.0)
MCH: 30.7 pg (ref 26.0–34.0)
MCHC: 31.5 g/dL (ref 30.0–36.0)
MCV: 97.3 fL (ref 80.0–100.0)
Platelets: 174 K/uL (ref 150–400)
RBC: 5.28 MIL/uL (ref 4.22–5.81)
RDW: 14 % (ref 11.5–15.5)
WBC: 8.3 K/uL (ref 4.0–10.5)
nRBC: 0 % (ref 0.0–0.2)

## 2023-12-04 LAB — GLUCOSE, CAPILLARY
Glucose-Capillary: 144 mg/dL — ABNORMAL HIGH (ref 70–99)
Glucose-Capillary: 94 mg/dL (ref 70–99)
Glucose-Capillary: 116 mg/dL — ABNORMAL HIGH (ref 70–99)
Glucose-Capillary: 229 mg/dL — ABNORMAL HIGH (ref 70–99)

## 2023-12-04 LAB — RENAL FUNCTION PANEL
Albumin: 2.8 g/dL — ABNORMAL LOW (ref 3.5–5.0)
Anion gap: 10 (ref 5–15)
BUN: 22 mg/dL (ref 8–23)
CO2: 34 mmol/L — ABNORMAL HIGH (ref 22–32)
Calcium: 9.3 mg/dL (ref 8.9–10.3)
Chloride: 96 mmol/L — ABNORMAL LOW (ref 98–111)
Creatinine, Ser: 0.73 mg/dL (ref 0.61–1.24)
GFR, Estimated: >60 mL/min (ref >60)
Glucose, Bld: 104 mg/dL — ABNORMAL HIGH (ref 70–99)
Phosphorus: 3.3 mg/dL (ref 2.5–4.6)
Potassium: 4.2 mmol/L (ref 3.5–5.1)
Sodium: 140 mmol/L (ref 135–145)

## 2023-12-04 MED ORDER — DAPAGLIFLOZIN PROPANEDIOL 10 MG PO TABS: 10.0000 mg | ORAL_TABLET | Freq: Every day | ORAL | 1 refills | Status: DC

## 2023-12-04 MED ORDER — FUROSEMIDE 10 MG/ML IJ SOLN
40.0000 mg | Freq: Once | INTRAMUSCULAR | Status: AC
  Administered 2023-12-04: 40 mg via INTRAVENOUS
  Filled 2023-12-04: qty 4

## 2023-12-04 MED ORDER — METOPROLOL SUCCINATE ER 100 MG PO TB24: 100.0000 mg | ORAL_TABLET | Freq: Every day | ORAL | 1 refills | Status: DC

## 2023-12-04 MED ORDER — PREDNISONE 20 MG PO TABS: ORAL_TABLET | ORAL | 0 refills | Status: AC

## 2023-12-04 MED ORDER — IPRATROPIUM-ALBUTEROL 0.5-2.5 (3) MG/3ML IN SOLN: 3.0000 mL | Freq: Two times a day (BID) | RESPIRATORY_TRACT | Status: DC

## 2023-12-04 NOTE — TOC CM/SW Note (Signed)
..  Transition of Care [facility]) - Inpatient Brief Assessment   Patient Details  Name: Joshua Schmidt MRN: [id number] Date of Birth: 1950/09/10  Transition of Care [facility]) CM/SW Contact:    Edsel DELENA Fischer, LCSW Phone Number: 12/04/2023, 11:09 AM   Clinical Narrative:  SW spoke to wife.  Wife will be transporting pt home once pt is ready for discharge.  SW contact [employer] regarding DME: walker and bipap.  SW reached out to MD regarding justification bipap.   Transition of Care Asessment:

## 2023-12-04 NOTE — Discharge Instructions (Signed)
 Please continue to wear compression socks. Please follow up with your PCP you made need to be started on a medication to help remove fluid in addition to farxiga .   Please take your steroids as prescribed.   Please use your breztri  inhaler every day and let your pulmonologist or primary care doctor know if you are close to running out of these.

## 2023-12-04 NOTE — Progress Notes (Addendum)
 Patient requires noninvasive ventilation due to severe COPD consequent to chronic respiratory failure. OSA is not the cause of the patient's hypercapnia. Bilevel has been ruled out as insufficient due to lack of backup batteries, lack of alarms, and lack of guaranteed pressure support. Patient had been on bilevel settings during admission but required ventilatory support with higher pressure support to lower his pCO2 levels as seen in ABG results. Without noninvasive ventilation, the patient will likely be readmitted to the hospital for an exacerbation, experience risk of harm or death.

## 2023-12-04 NOTE — Plan of Care (Signed)
  Problem: Respiratory: Goal: Ability to maintain a clear airway and adequate ventilation will improve Outcome: Progressing   Problem: Role Relationship: Goal: Method of communication will improve Outcome: Progressing   Problem: Education: Goal: Knowledge of General Education information will improve Description: Including pain rating scale, medication(s)/side effects and non-pharmacologic comfort measures Outcome: Progressing   Problem: Health Behavior/Discharge Planning: Goal: Ability to manage health-related needs will improve Outcome: Progressing   Problem: Clinical Measurements: Goal: Ability to maintain clinical measurements within normal limits will improve Outcome: Progressing Goal: Will remain free from infection Outcome: Progressing Goal: Diagnostic test results will improve Outcome: Progressing Goal: Respiratory complications will improve Outcome: Progressing Goal: Cardiovascular complication will be avoided Outcome: Progressing   Problem: Activity: Goal: Risk for activity intolerance will decrease Outcome: Progressing   Problem: Nutrition: Goal: Adequate nutrition will be maintained Outcome: Progressing   Problem: Coping: Goal: Level of anxiety will decrease Outcome: Progressing   Problem: Elimination: Goal: Will not experience complications related to bowel motility Outcome: Progressing Goal: Will not experience complications related to urinary retention Outcome: Progressing   Problem: Pain Managment: Goal: General experience of comfort will improve and/or be controlled Outcome: Progressing   Problem: Safety: Goal: Ability to remain free from injury will improve Outcome: Progressing   Problem: Skin Integrity: Goal: Risk for impaired skin integrity will decrease Outcome: Progressing

## 2023-12-04 NOTE — Plan of Care (Signed)
  Problem: Activity: Goal: Ability to tolerate increased activity will improve Outcome: Completed/Met   Problem: Respiratory: Goal: Ability to maintain a clear airway and adequate ventilation will improve Outcome: Progressing   Problem: Clinical Measurements: Goal: Ability to maintain clinical measurements within normal limits will improve Outcome: Progressing Goal: Will remain free from infection Outcome: Progressing Goal: Diagnostic test results will improve Outcome: Progressing Goal: Respiratory complications will improve Outcome: Progressing Goal: Cardiovascular complication will be avoided Outcome: Progressing   Problem: Safety: Goal: Ability to remain free from injury will improve Outcome: Progressing   Problem: Skin Integrity: Goal: Risk for impaired skin integrity will decrease Outcome: Progressing

## 2023-12-05 NOTE — Discharge Summary (Incomplete)
 Physician Discharge Summary   Patient: Joshua Schmidt MRN: [id number] DOB: 1951/04/04  Admit date:     11/28/2023  Discharge date: 12/04/2023  Discharge Physician: Joshua Schmidt   PCP: Joshua Fend, DO   Recommendations at discharge:  {Tip this will not be part of the note when signed- Example include specific recommendations for outpatient follow-up, pending tests to follow-up on. (Optional):26781}  F/u with   Discharge Diagnoses: Principal Problem:   Acute on chronic hypoxic respiratory failure (HCC) Active Problems:   Acute on chronic respiratory failure with hypoxia and hypercapnia (HCC)  Resolved Problems:   * No resolved hospital problems. [facility] Course:    Assessment and Plan: No notes have been filed under this hospital service. Service: Hospitalist     {Tip this will not be part of the note when signed Body mass index is 31.69 kg/m. ,  Nutrition Documentation    Flowsheet Row ED to Hosp-Admission (Discharged) from 11/28/2023 in [facility] ICU/CCU  Nutrition Problem Inadequate oral intake  Etiology inability to eat  [pt sedated and ventilated]  Nutrition Goal Patient will meet greater than or equal to 90% of their needs  ,  (Optional):26781}   Consultants: PCCM  Procedures performed: Intubated 8/18  Disposition: Home Diet recommendation:  Discharge Diet Orders (From admission, onward)     Start     Ordered   12/04/23 0000  Diet - low sodium heart healthy        12/04/23 1646           Regular diet DISCHARGE MEDICATION: Allergies as of 12/04/2023   No Known Allergies      Medication List     STOP taking these medications    carvedilol  12.5 MG tablet Commonly known as: COREG        TAKE these medications    aspirin  81 MG chewable tablet Chew 81 mg by mouth daily.   atorvastatin  20 MG tablet Commonly known as: LIPITOR Take 1 tablet (20 mg total) by mouth daily.   Breztri  Aerosphere 160-9-4.8  MCG/ACT Aero inhaler Generic drug: budesonide -glycopyrrolate -formoterol  Inhale 2 puffs into the lungs in the morning and at bedtime.   dapagliflozin  propanediol 10 MG Tabs tablet Commonly known as: FARXIGA  Take 1 tablet (10 mg total) by mouth daily.   fluticasone  50 MCG/ACT nasal spray Commonly known as: FLONASE  Place 2 sprays into both nostrils daily.   folic acid  1 MG tablet Commonly known as: FOLVITE  Take 1 tablet (1 mg total) by mouth daily.   hydrochlorothiazide  25 MG tablet Commonly known as: HYDRODIURIL  Take 1 tablet (25 mg total) by mouth daily.   losartan  50 MG tablet Commonly known as: COZAAR  TAKE 1 TABLET BY MOUTH DAILY   metoprolol  succinate 100 MG 24 hr tablet Commonly known as: TOPROL -XL Take 1 tablet (100 mg total) by mouth daily. Take with or immediately following a meal.   multivitamin with minerals Tabs tablet Take 1 tablet by mouth daily.   predniSONE  20 MG tablet Commonly known as: DELTASONE  Take 2 tablets (40 mg total) by mouth daily with breakfast for 3 days, THEN 1 tablet (20 mg total) daily with breakfast for 4 days, THEN 0.5 tablets (10 mg total) daily with breakfast for 4 days. Start taking on: December 05, 2023   Ventolin  HFA 108 (90 Base) MCG/ACT inhaler Generic drug: albuterol  USE 2 INHALATIONS BY MOUTH EVERY 6 HOURS AS NEEDED FOR WHEEZING  OR SHORTNESS OF BREATH        Follow-up Information  Joshua Fend, DO. Schedule an appointment as soon as possible for a visit in 1 week(s).   Specialty: Internal Medicine Contact information: [address] [place] [address] [phone]         Joshua Dedra CROME, MD. Schedule an appointment as soon as possible for a visit in 1 week(s).   Specialty: Pulmonary Disease Contact information: [address] [place], [place] [address] [place] [address] [phone]                Discharge Exam: Joshua Schmidt   12/02/23 1400 12/03/23 0500 12/04/23 0342  Weight:  106.1 kg 106.4 kg 106 kg   Physical Exam  Constitutional: In no distress.  Cardiovascular: Normal rate, regular rhythm. Pedal edema   Pulmonary: Non labored breathing on [place], no rales, diffuse wheezing.  No lower extremity edema Abdominal: Soft. Normal bowel sounds. Non distended and non tender Musculoskeletal: Normal range of motion.     Neurological: Alert and oriented to person, place, and time. Non focal  Skin: Skin is warm and dry.   Condition at discharge: stable  The results of significant diagnostics from this hospitalization (including imaging, microbiology, ancillary and laboratory) are listed below for reference.   Imaging Studies: DG Chest Port 1 View Result Date: 11/30/2023 CLINICAL DATA:  [id number].  Hypoxic respiratory failure. EXAM: PORTABLE CHEST 1 VIEW COMPARISON:  Portable chest 11/28/2023 at 11:56 p.m. FINDINGS: 5:01 a.m. ETT tip 6.4 cm from the carina. NGT is adequately intragastric with tip likely in the gastric body based on the position of the side-hole. There are continued small pleural effusions and patchy consolidation in the left lower lobe. The lungs are otherwise clear. There is stable cardiomegaly and aortic tortuosity and ectasia with atherosclerosis. Central vascular prominence continues to be seen without overt edema. Overall aeration seems unchanged.  Thoracic spondylosis. IMPRESSION: 1. No new or worsening lung opacity. 2. ETT tip 6.4 cm from the carina. 3. NGT adequately intragastric. 4. Stable cardiomegaly, central vascular prominence and aortic tortuosity and ectasia. Electronically Signed   By: Francis Quam M.D.   On: 11/30/2023 05:39   ECHOCARDIOGRAM COMPLETE Result Date: 11/29/2023    ECHOCARDIOGRAM REPORT   Patient Name:   Joshua Schmidt Date of Exam: 11/29/2023 Medical Rec #:  [id number]       Height:       72.0 in Accession #:    [id number]      Weight:       226.2 lb Date of Birth:  [birth year]       BSA:          2.245 m Patient Age:    73 years         BP:           102/68 mmHg Patient Gender: M               HR:           56 bpm. Exam Location:  [facility] Procedure: 2D Echo, Cardiac Doppler and Color Doppler (Both Spectral and Color            Flow Doppler were utilized during procedure). Indications:     CHF--acute diastolic I50.31  History:         Patient has prior history of Echocardiogram examinations, most                  recent 10/02/2022. CAD and Previous Myocardial Infarction, COPD;  Risk Factors:Hypertension.  Sonographer:     Christopher Furnace Referring Phys:  [id number] ALEXIS HUGELMEYER Diagnosing Phys: Deatrice Cage MD  Sonographer Comments: Echo performed with patient supine and on artificial respirator. IMPRESSIONS  1. Left ventricular ejection fraction, by estimation, is 55 to 60%. The left ventricle has normal function. The left ventricle has no regional wall motion abnormalities. There is moderate left ventricular hypertrophy. Left ventricular diastolic parameters are consistent with Grade I diastolic dysfunction (impaired relaxation).  2. Right ventricular systolic function is normal. The right ventricular size is normal. Tricuspid regurgitation signal is inadequate for assessing PA pressure.  3. Right atrial size was mildly dilated.  4. The mitral valve is normal in structure. Trivial mitral valve regurgitation. No evidence of mitral stenosis. Moderate mitral annular calcification.  5. The aortic valve is normal in structure. Aortic valve regurgitation is not visualized. Aortic valve sclerosis/calcification is present, without any evidence of aortic stenosis.  6. Aortic dilatation noted. There is moderate dilatation of the aortic root and of the ascending aorta, measuring 45 mm.  7. The inferior vena cava is dilated in size with <50% respiratory variability, suggesting right atrial pressure of 15 mmHg. FINDINGS  Left Ventricle: Left ventricular ejection fraction, by estimation, is 55 to 60%. The left ventricle has normal function. The left  ventricle has no regional wall motion abnormalities. The left ventricular internal cavity size was normal in size. There is  moderate left ventricular hypertrophy. Left ventricular diastolic parameters are consistent with Grade I diastolic dysfunction (impaired relaxation). Right Ventricle: The right ventricular size is normal. No increase in right ventricular wall thickness. Right ventricular systolic function is normal. Tricuspid regurgitation signal is inadequate for assessing PA pressure. Left Atrium: Left atrial size was normal in size. Right Atrium: Right atrial size was mildly dilated. Pericardium: There is no evidence of pericardial effusion. Mitral Valve: The mitral valve is normal in structure. Moderate mitral annular calcification. Trivial mitral valve regurgitation. No evidence of mitral valve stenosis. MV peak gradient, 6.2 mmHg. The mean mitral valve gradient is 2.0 mmHg. Tricuspid Valve: The tricuspid valve is normal in structure. Tricuspid valve regurgitation is not demonstrated. No evidence of tricuspid stenosis. Aortic Valve: The aortic valve is normal in structure. Aortic valve regurgitation is not visualized. Aortic valve sclerosis/calcification is present, without any evidence of aortic stenosis. Aortic valve mean gradient measures 3.0 mmHg. Aortic valve peak  gradient measures 5.0 mmHg. Aortic valve area, by VTI measures 3.10 cm. Pulmonic Valve: The pulmonic valve was normal in structure. Pulmonic valve regurgitation is not visualized. No evidence of pulmonic stenosis. Aorta: Aortic dilatation noted. There is moderate dilatation of the aortic root and of the ascending aorta, measuring 45 mm. Venous: The inferior vena cava is dilated in size with less than 50% respiratory variability, suggesting right atrial pressure of 15 mmHg. IAS/Shunts: No atrial level shunt detected by color flow Doppler.  LEFT VENTRICLE PLAX 2D LVIDd:         4.60 cm   Diastology LVIDs:         3.20 cm   LV e' medial:     5.98 cm/s LV PW:         1.40 cm   LV E/e' medial:  11.7 LV IVS:        1.50 cm   LV e' lateral:   11.30 cm/s LVOT diam:     2.20 cm   LV E/e' lateral: 6.2 LV SV:         65 LV SV  Index:   29 LVOT Area:     3.80 cm  RIGHT VENTRICLE RV Basal diam:  4.90 cm RV Mid diam:    4.00 cm LEFT ATRIUM             Index        RIGHT ATRIUM           Index LA diam:        3.30 cm 1.47 cm/m   RA Area:     21.10 cm LA Vol (A2C):   59.2 ml 26.37 ml/m  RA Volume:   58.20 ml  25.92 ml/m LA Vol (A4C):   44.9 ml 20.00 ml/m LA Biplane Vol: 53.9 ml 24.01 ml/m  AORTIC VALVE AV Area (Vmax):    3.12 cm AV Area (Vmean):   2.86 cm AV Area (VTI):     3.10 cm AV Vmax:           112.00 cm/s AV Vmean:          78.800 cm/s AV VTI:            0.211 m AV Peak Grad:      5.0 mmHg AV Mean Grad:      3.0 mmHg LVOT Vmax:         91.80 cm/s LVOT Vmean:        59.200 cm/s LVOT VTI:          0.172 m LVOT/AV VTI ratio: 0.82  AORTA Ao Root diam: 4.50 cm MITRAL VALVE                TRICUSPID VALVE MV Area (PHT): 2.29 cm     TR Peak grad:   21.7 mmHg MV Area VTI:   1.87 cm     TR Vmax:        233.00 cm/s MV Peak grad:  6.2 mmHg MV Mean grad:  2.0 mmHg     SHUNTS MV Vmax:       1.24 m/s     Systemic VTI:  0.17 m MV Vmean:      60.6 cm/s    Systemic Diam: 2.20 cm MV Decel Time: 332 msec MV E velocity: 70.20 cm/s MV A velocity: 104.00 cm/s MV E/A ratio:  0.68 Deatrice Cage MD Electronically signed by Deatrice Cage MD Signature Date/Time: 11/29/2023/4:33:12 PM    Final    DG Abd Portable 1V Result Date: 11/29/2023 CLINICAL DATA:  Nasogastric tube placement. EXAM: PORTABLE ABDOMEN - 1 VIEW COMPARISON:  Radiograph yesterday. FINDINGS: Tip and side port of the enteric tube below the diaphragm in the region of the mid stomach. No bowel dilatation in the included upper abdomen. Clearance of excreted IV contrast from the renal collecting systems. IMPRESSION: Tip and side port of the enteric tube below the diaphragm in the region of the mid stomach.  Electronically Signed   By: Andrea Gasman M.D.   On: 11/29/2023 13:13   DG Chest Port 1 View Result Date: 11/29/2023 CLINICAL DATA:  Intubation and OG tube placement EXAM: PORTABLE CHEST 1 VIEW COMPARISON:  Same day chest radiograph and CT FINDINGS: Stable cardiomediastinal silhouette. Similar infiltrates in the left mid and lower lung no pleural effusion or pneumothorax. Endotracheal tube tip in the intrathoracic trachea 2.8 cm from the carina. Subdiaphragmatic enteric tube. IMPRESSION: 1. Endotracheal tube tip in the intrathoracic trachea 2.8 cm from the carina. 2. Similar infiltrates in the left mid and lower lung. Electronically Signed   By: Norman Charletta HERO.D.  On: 11/29/2023 00:13   DG Abd 1 View Result Date: 11/29/2023 CLINICAL DATA:  ET tube, OG tube placement EXAM: ABDOMEN - 1 VIEW COMPARISON:  None Available. FINDINGS: OG tube tip is in the stomach with the side port near the GE junction. Nonobstructive bowel gas pattern. IMPRESSION: OG tube tip in the stomach. Electronically Signed   By: Franky Crease M.D.   On: 11/29/2023 00:11   CT HEAD WO CONTRAST ( ) Result Date: 11/28/2023 EXAM: CT HEAD WITHOUT CONTRAST 11/28/2023 10:53:23 PM TECHNIQUE: CT of the head was performed without the administration of intravenous contrast. Automated exposure control, iterative reconstruction, and/or weight based adjustment of the mA/kV was utilized to reduce the radiation dose to as low as reasonably achievable. COMPARISON: None available. CLINICAL HISTORY: Mental status change, unknown cause. FINDINGS: BRAIN AND VENTRICLES: No acute hemorrhage. Gray-white differentiation is preserved. No hydrocephalus. No extra-axial collection. No mass effect or midline shift. Chronic ischemic white matter changes. Subcortical infarct in the left frontal lobe is favored to be chronic. ORBITS: No acute abnormality. SINUSES: No acute abnormality. SOFT TISSUES AND SKULL: No acute soft tissue abnormality. No skull fracture.  IMPRESSION: 1. No acute intracranial abnormality. 2. Chronic ischemic white matter changes and subcortical infarct in the left frontal lobe, favored to be chronic. Electronically signed by: Franky Stanford MD 11/28/2023 11:05 PM EDT RP Workstation: [id number]   CT Angio Chest PE W/Cm &/Or Wo Cm Result Date: 11/28/2023 CLINICAL DATA:  Hypoxia and altered mental status. EXAM: CT ANGIOGRAPHY CHEST WITH CONTRAST TECHNIQUE: Multidetector CT imaging of the chest was performed using the standard protocol during bolus administration of intravenous contrast. Multiplanar CT image reconstructions and MIPs were obtained to evaluate the vascular anatomy. RADIATION DOSE REDUCTION: This exam was performed according to the departmental dose-optimization program which includes automated exposure control, adjustment of the mA and/or kV according to patient size and/or use of iterative reconstruction technique. CONTRAST:  OMNIPAQUE  IOHEXOL  350 MG/ML SOLN COMPARISON:  Same day chest radiograph and CT chest 02/16/2023 FINDINGS: Cardiovascular: Cardiomegaly. No pericardial effusion. Coronary artery and aortic atherosclerotic calcification. Ascending aorta is dilated measuring 47 mm in diameter. No dissection. Dilated main pulmonary artery measuring 44 mm in diameter. Negative for pulmonary embolism. Mediastinum/Nodes: Trachea and esophagus are unremarkable. No pathologic adenopathy. Lungs/Pleura: Peribronchovascular consolidation in the right lower lobe, left lingula, greatest in the left lower lobe. Findings may be due to atelectasis however aspiration or pneumonia could appear similarly. Upper Abdomen: No acute abnormality. Musculoskeletal: Acute or subacute appearing fracture of T10, new since 02/16/2023. There is 25% vertebral body height loss and no retropulsion. Unchanged 2.2 cm soft tissue nodule in the subcutaneous fat of the anterior right upper abdomen, likely representing a sebaceous cyst. Review of the MIP images  confirms the above findings. IMPRESSION: 1. Negative for pulmonary embolism. 2. Peribronchovascular consolidation in the right lower lobe, left lingula, greatest in the left lower lobe. Findings may be due to atelectasis however aspiration or pneumonia could appear similarly. 3. Acute or subacute appearing fracture of T10 with 25% vertebral body height loss and no retropulsion. 4. Ascending aortic aneurysm measuring 47 mm in diameter. This is not substantially changed from 02/16/2023 using similar measuring technique. Recommend semi-annual imaging followup by CTA or MRA and referral to cardiothoracic surgery if not already obtained. This recommendation follows 2010 ACCF/AHA/AATS/ACR/ASA/SCA/SCAI/SIR/STS/SVM Guidelines for the Diagnosis and Management of Patients With Thoracic Aortic Disease. Circulation. 2010; 121: Z733-z630. Aortic aneurysm NOS (ICD10-I71.9) 5. Aortic Atherosclerosis (ICD10-I70.0). Electronically Signed   By: Norman  Stutzman M.D.   On: 11/28/2023 21:48   US  Venous Img Lower Bilateral (DVT) Result Date: 11/28/2023 CLINICAL DATA:  Lower extremity swelling EXAM: BILATERAL LOWER EXTREMITY VENOUS DOPPLER ULTRASOUND TECHNIQUE: Gray-scale sonography with compression, as well as color and duplex ultrasound, were performed to evaluate the deep venous system(s) from the level of the common femoral vein through the popliteal and proximal calf veins. COMPARISON:  None Available. FINDINGS: VENOUS Normal compressibility of the common femoral, superficial femoral, and popliteal veins, as well as the visualized calf veins. Visualized portions of profunda femoral vein and great saphenous vein unremarkable. No filling defects to suggest DVT on grayscale or color Doppler imaging. Doppler waveforms show normal direction of venous flow, normal respiratory plasticity and response to augmentation. OTHER None. Limitations: none IMPRESSION: Negative. Electronically Signed   By: Franky Crease M.D.   On: 11/28/2023 19:08    DG Chest 1 View Result Date: 11/28/2023 CLINICAL DATA:  Hypoxia and altered mental status EXAM: CHEST  1 VIEW COMPARISON:  09/28/2022 FINDINGS: Cardiomegaly and pulmonary vascular congestion. Interstitial coarsening in the lower lungs may be due to edema or infection. No pleural effusion or pneumothorax. Aortic atherosclerotic calcification. IMPRESSION: Cardiomegaly and pulmonary vascular congestion. Interstitial coarsening in the lower lungs, favor edema. Electronically Signed   By: Norman Gatlin M.D.   On: 11/28/2023 18:55    Microbiology: Results for orders placed or performed during the hospital encounter of 11/28/23  MRSA Next Gen by PCR, Nasal     Status: None   Collection Time: 11/28/23 11:28 PM   Specimen: Nasal Mucosa; Nasal Swab  Result Value Ref Range Status   MRSA by PCR Next Gen NOT DETECTED NOT DETECTED Final    Comment: (NOTE) The GeneXpert MRSA Assay (FDA approved for NASAL specimens only), is one component of a comprehensive MRSA colonization surveillance program. It is not intended to diagnose MRSA infection nor to guide or monitor treatment for MRSA infections. Test performance is not FDA approved in patients less than 84 years old. Performed at [facility], [address] [place], [place] [address]   Culture, Respiratory w Gram Stain     Status: None   Collection Time: 11/29/23  5:13 AM   Specimen: Tracheal Aspirate; Respiratory  Result Value Ref Range Status   Specimen Description   Final    TRACHEAL ASPIRATE Performed at [facility], [address], [place], [place] [address]    Special Requests   Final    NONE Performed at [facility], [address] [place], [place] [address]    Gram Stain   Final    FEW WBC PRESENT, PREDOMINANTLY PMN FEW GRAM POSITIVE COCCI IN PAIRS FEW GRAM NEGATIVE RODS    Culture   Final    FEW Normal respiratory flora-no Staph aureus or Pseudomonas seen Performed at [facility],  [address], [place], [place] [address]    Report Status 12/01/2023 FINAL  Final  Respiratory (~20 pathogens) panel by PCR     Status: None   Collection Time: 11/29/23  5:59 PM   Specimen: Nasopharyngeal Swab; Respiratory  Result Value Ref Range Status   Adenovirus NOT DETECTED NOT DETECTED Final   Coronavirus 229E NOT DETECTED NOT DETECTED Final    Comment: (NOTE) The Coronavirus on the Respiratory Panel, DOES NOT test for the novel  Coronavirus (2019 nCoV)    Coronavirus HKU1 NOT DETECTED NOT DETECTED Final   Coronavirus NL63 NOT DETECTED NOT DETECTED Final   Coronavirus OC43  NOT DETECTED NOT DETECTED Final   Metapneumovirus NOT DETECTED NOT DETECTED Final   Rhinovirus / Enterovirus NOT DETECTED NOT DETECTED Final   Influenza A NOT DETECTED NOT DETECTED Final   Influenza B NOT DETECTED NOT DETECTED Final   Parainfluenza Virus 1 NOT DETECTED NOT DETECTED Final   Parainfluenza Virus 2 NOT DETECTED NOT DETECTED Final   Parainfluenza Virus 3 NOT DETECTED NOT DETECTED Final   Parainfluenza Virus 4 NOT DETECTED NOT DETECTED Final   Respiratory Syncytial Virus NOT DETECTED NOT DETECTED Final   Bordetella pertussis NOT DETECTED NOT DETECTED Final   Bordetella Parapertussis NOT DETECTED NOT DETECTED Final   Chlamydophila pneumoniae NOT DETECTED NOT DETECTED Final   Mycoplasma pneumoniae NOT DETECTED NOT DETECTED Final    Comment: Performed at [facility], [address] [place], [place] [address]    Labs: CBC: Recent Labs  Lab 11/30/23 0543 12/01/23 0558 12/02/23 0431 12/03/23 0404 12/04/23 0515  WBC 9.5 9.5 9.3 8.0 8.3  HGB 16.3 16.3 16.6 16.3 16.2  HCT 50.3 51.2 53.9* 51.3 51.4  MCV 96.5 97.5 99.6 97.5 97.3  PLT 199 178 170 168 174   Basic Metabolic Panel: Recent Labs  Lab 11/29/23 1826 11/30/23 0543 12/01/23 0558 12/02/23 0431 12/03/23 0404 12/04/23 0515  NA 135 138 141 140 140 140  K 3.8 3.8 4.4 4.9 4.5 4.2  CL 93* 93* 98 99 95* 96*  CO2 32 34* 34* 32 37*  34*  GLUCOSE 147* 155* 121* 158* 107* 104*  BUN 26* 29* 20 24* 22 22  CREATININE 0.80 0.92 0.73 0.62 0.68 0.73  CALCIUM  8.5* 8.6* 8.7* 9.0 9.4 9.3  MG 2.0 2.5* 2.2 2.2 2.3  --   PHOS  --  3.8 2.8 2.6 3.2 3.3   Liver Function Tests: Recent Labs  Lab 11/28/23 1753 11/29/23 0345 11/30/23 0543 12/01/23 0558 12/02/23 0431 12/03/23 0404 12/04/23 0515  AST 42* 37  --   --   --   --   --   ALT 68* 63*  --   --   --   --   --   ALKPHOS 66 56  --   --   --   --   --   BILITOT 1.0 0.8  --   --   --   --   --   PROT 7.0 6.1*  --   --   --   --   --   ALBUMIN 3.5 3.0* 2.9* 2.8* 3.0* 3.0* 2.8*   CBG: Recent Labs  Lab 12/03/23 2313 12/04/23 0325 12/04/23 0732 12/04/23 1151 12/04/23 1600  GLUCAP 81 144* 94 116* 229*    Discharge time spent: greater than 30 minutes.  Signed: Alban Pepper, MD [facility] 12/05/2023

## 2023-12-06 NOTE — Discharge Summary (Signed)
 Physician Discharge Summary   Patient: Joshua Schmidt MRN: [id number] DOB: 09-Aug-1950  Admit date:     11/28/2023  Discharge date: 12/04/2023  Discharge Physician: Alban Pepper   PCP: Bernardo Fend, DO   Recommendations at discharge:    Consider starting MRA, check to see if toelrating SGLTi and bipap  Discharge Diagnoses: Principal Problem:   Acute on chronic hypoxic respiratory failure (HCC) Active Problems:   Acute on chronic respiratory failure with hypoxia and hypercapnia (HCC)  Resolved Problems:   * No resolved hospital problems. [facility] Course: 73 yo M presenting to [facility] ED from home for evaluation of hypoxia and AMS since the afternoon of 11/28/23 around 4 pm.   History obtained per chart review and spouse telephone report as patient is intubated and sedated at this time. Patient was described by spouse to be in his normal state of health until Sat night 8/16. He had been sitting with her out on the porch with his O2 on and then was unable to walk into the house, falling to his knees having to crawl. He is normally independent of ADL's and handles his own medication.   Overnight his wife suspected he had removed his oxygen , and then didn't wake up until the afternoon. When he woke up he was walking normally but acting irrationally and was confused.  She denied any other symptoms including: cough, congestion, fevers/chills, urinary symptoms, abdominal pain/ nausea/ vomiting/ diarrhea. She reports that he consumes 3 beers nightly and has started smoking again. He has told her only 1 cigarette a day, but can't be sure.   ED course: Upon arrival patient hypoxic in triage at 74% on 2 L , this was increased to 4 L then 6 L but still at 83%. After adjustment to NRB 15 L the patient recovered to 91%. He was treated for suspected CHF exacerbation and AECOPD. His hypoxia improved after treatment, but appeared to have increased work of breathing and was placed on BIPAP  support. Due to history of DVT without chronic anticoagulation- CT angio ordered. During transport to CT angio patient vomited into his BIPAP mask which was removed then for safety. Upon arrival back to his room, he was somnolent and hypoxic requiring intubation and mechanical ventilatory support. Imaging negative for PE but concerning for possible aspiration pneumonitis. Labs significant for respiratory acidosis, elevated BNP suggestive of HFpEF exacerbation, mild Transaminitis and elevated troponin.    Medications given: Lasix , etomidate /rocuronium , solu-medrol , duo-neb with propofol  drip started   11/28/23: Admit to ICU due to acute on chronic hypercapnic and hypoxic respiratory failure s/t suspected aspiration in the setting of HFpEF & AECOPD requiring intubation and mechanical ventilatory support. 11/29/23: remains intubated and ventilated, sedated and unresponsive 11/30/23: intubated and ventilated, follows commands off sedation, extubated 8/19 8/20 TRH resumed care of patient. He continued to improved and was discharged 8/23.   Assessment and Plan: Acute on chronic hypoxic and hypercapnic RF  AE of COPD  Aspiration pna Metabolic alkalosis  S/p azithromycin  3d and unasyn3d and Augmentin  2d  Bipap at night Continue home Breztri   - Patient discharged home on bipap   AMS resolved Patient confused due to hypercapnia. CT head w/o contrast with no acute abnormality. No evidence of swelling/edema on imaging. He will continue bipap at night at home.    HFpEF  Mild lower extremity edema on discharge.  Not currently on spironolactone  or SGLT 2 inhibitor. He was started on farxiga  on discharge. Consider MRA in outpatient setting.  HTN CAD s/p MI  HLD Likely Chronic Non ischemic myocardial injury in the setting of severe COPD Continue  statin aspirin  Coreg  transitioned to metoprolol  succinate on discharge Continued on losartan  and hydrochlorothiazide  Elevated troponin likely chronic in  setting of severe copd   OSA Bipap at night.  Discharged on bipap at home.    Infrarenal AAA Monitored outpatient   RCIA/LCIA Monitored outpatient stable.    TUD  Unclear if active smoker.    NO evidence of DVT This is documented in chart.  Vascular surgery note from 12/2020 shows that DVT was never confirmed.  Patient had acutely worsening unilateral right lower extremity swelling with negative venous duplex study          Consultants: pccm Procedures performed: intubation  Disposition: Home health Diet recommendation:  Discharge Diet Orders (From admission, onward)     Start     Ordered   12/04/23 0000  Diet - low sodium heart healthy        12/04/23 1646           Regular diet DISCHARGE MEDICATION: Allergies as of 12/04/2023   No Known Allergies      Medication List     STOP taking these medications    carvedilol  12.5 MG tablet Commonly known as: COREG        TAKE these medications    aspirin  81 MG chewable tablet Chew 81 mg by mouth daily.   atorvastatin  20 MG tablet Commonly known as: LIPITOR Take 1 tablet (20 mg total) by mouth daily.   Breztri  Aerosphere 160-9-4.8 MCG/ACT Aero inhaler Generic drug: budesonide -glycopyrrolate -formoterol  Inhale 2 puffs into the lungs in the morning and at bedtime.   dapagliflozin  propanediol 10 MG Tabs tablet Commonly known as: FARXIGA  Take 1 tablet (10 mg total) by mouth daily.   fluticasone  50 MCG/ACT nasal spray Commonly known as: FLONASE  Place 2 sprays into both nostrils daily.   folic acid  1 MG tablet Commonly known as: FOLVITE  Take 1 tablet (1 mg total) by mouth daily.   hydrochlorothiazide  25 MG tablet Commonly known as: HYDRODIURIL  Take 1 tablet (25 mg total) by mouth daily.   losartan  50 MG tablet Commonly known as: COZAAR  TAKE 1 TABLET BY MOUTH DAILY   metoprolol  succinate 100 MG 24 hr tablet Commonly known as: TOPROL -XL Take 1 tablet (100 mg total) by mouth daily. Take with or  immediately following a meal.   multivitamin with minerals Tabs tablet Take 1 tablet by mouth daily.   predniSONE  20 MG tablet Commonly known as: DELTASONE  Take 2 tablets (40 mg total) by mouth daily with breakfast for 3 days, THEN 1 tablet (20 mg total) daily with breakfast for 4 days, THEN 0.5 tablets (10 mg total) daily with breakfast for 4 days. Start taking on: December 05, 2023   Ventolin  HFA 108 (90 Base) MCG/ACT inhaler Generic drug: albuterol  USE 2 INHALATIONS BY MOUTH EVERY 6 HOURS AS NEEDED FOR WHEEZING  OR SHORTNESS OF BREATH        Follow-up Information     Bernardo Fend, DO. Schedule an appointment as soon as possible for a visit in 1 week(s).   Specialty: Internal Medicine Contact information: [address] [place] [address] [phone]         Tamea Dedra CROME, MD. Schedule an appointment as soon as possible for a visit in 1 week(s).   Specialty: Pulmonary Disease Contact information: [address] [place], [address] [place] [address] [phone]  Discharge Exam: Filed Weights   12/02/23 1400 12/03/23 0500 12/04/23 0342  Weight: 106.1 kg 106.4 kg 106 kg   Constitutional: In no distress.  Cardiovascular: Normal rate, regular rhythm. Pedal edema   Pulmonary: Non labored breathing on [place], no rales, diffuse wheezing.  No lower extremity edema Abdominal: Soft. Normal bowel sounds. Non distended and non tender Musculoskeletal: Normal range of motion.     Neurological: Alert and oriented to person, place, and time. Non focal  Skin: Skin is warm and dry.     Condition at discharge: stable  The results of significant diagnostics from this hospitalization (including imaging, microbiology, ancillary and laboratory) are listed below for reference.   Imaging Studies: DG Chest Port 1 View Result Date: 11/30/2023 CLINICAL DATA:  [id number].  Hypoxic respiratory failure. EXAM: PORTABLE CHEST 1 VIEW COMPARISON:   Portable chest 11/28/2023 at 11:56 p.m. FINDINGS: 5:01 a.m. ETT tip 6.4 cm from the carina. NGT is adequately intragastric with tip likely in the gastric body based on the position of the side-hole. There are continued small pleural effusions and patchy consolidation in the left lower lobe. The lungs are otherwise clear. There is stable cardiomegaly and aortic tortuosity and ectasia with atherosclerosis. Central vascular prominence continues to be seen without overt edema. Overall aeration seems unchanged.  Thoracic spondylosis. IMPRESSION: 1. No new or worsening lung opacity. 2. ETT tip 6.4 cm from the carina. 3. NGT adequately intragastric. 4. Stable cardiomegaly, central vascular prominence and aortic tortuosity and ectasia. Electronically Signed   By: Francis Quam M.D.   On: 11/30/2023 05:39   ECHOCARDIOGRAM COMPLETE Result Date: 11/29/2023    ECHOCARDIOGRAM REPORT   Patient Name:   ALICIA ACKERT Buehrle Date of Exam: 11/29/2023 Medical Rec #:  [id number]       Height:       72.0 in Accession #:    [id number]      Weight:       226.2 lb Date of Birth:  11-Feb-1951       BSA:          2.245 m Patient Age:    73 years        BP:           102/68 mmHg Patient Gender: M               HR:           56 bpm. Exam Location:  [facility] Procedure: 2D Echo, Cardiac Doppler and Color Doppler (Both Spectral and Color            Flow Doppler were utilized during procedure). Indications:     CHF--acute diastolic I50.31  History:         Patient has prior history of Echocardiogram examinations, most                  recent 10/02/2022. CAD and Previous Myocardial Infarction, COPD;                  Risk Factors:Hypertension.  Sonographer:     Christopher Furnace Referring Phys:  [id number] ALEXIS HUGELMEYER Diagnosing Phys: Deatrice Cage MD  Sonographer Comments: Echo performed with patient supine and on artificial respirator. IMPRESSIONS  1. Left ventricular ejection fraction, by estimation, is 55 to 60%. The left ventricle has normal function. The  left ventricle has no regional wall motion abnormalities. There is moderate left ventricular hypertrophy. Left ventricular diastolic parameters are consistent with Grade I diastolic dysfunction (impaired relaxation).  2. Right  ventricular systolic function is normal. The right ventricular size is normal. Tricuspid regurgitation signal is inadequate for assessing PA pressure.  3. Right atrial size was mildly dilated.  4. The mitral valve is normal in structure. Trivial mitral valve regurgitation. No evidence of mitral stenosis. Moderate mitral annular calcification.  5. The aortic valve is normal in structure. Aortic valve regurgitation is not visualized. Aortic valve sclerosis/calcification is present, without any evidence of aortic stenosis.  6. Aortic dilatation noted. There is moderate dilatation of the aortic root and of the ascending aorta, measuring 45 mm.  7. The inferior vena cava is dilated in size with <50% respiratory variability, suggesting right atrial pressure of 15 mmHg. FINDINGS  Left Ventricle: Left ventricular ejection fraction, by estimation, is 55 to 60%. The left ventricle has normal function. The left ventricle has no regional wall motion abnormalities. The left ventricular internal cavity size was normal in size. There is  moderate left ventricular hypertrophy. Left ventricular diastolic parameters are consistent with Grade I diastolic dysfunction (impaired relaxation). Right Ventricle: The right ventricular size is normal. No increase in right ventricular wall thickness. Right ventricular systolic function is normal. Tricuspid regurgitation signal is inadequate for assessing PA pressure. Left Atrium: Left atrial size was normal in size. Right Atrium: Right atrial size was mildly dilated. Pericardium: There is no evidence of pericardial effusion. Mitral Valve: The mitral valve is normal in structure. Moderate mitral annular calcification. Trivial mitral valve regurgitation. No evidence of  mitral valve stenosis. MV peak gradient, 6.2 mmHg. The mean mitral valve gradient is 2.0 mmHg. Tricuspid Valve: The tricuspid valve is normal in structure. Tricuspid valve regurgitation is not demonstrated. No evidence of tricuspid stenosis. Aortic Valve: The aortic valve is normal in structure. Aortic valve regurgitation is not visualized. Aortic valve sclerosis/calcification is present, without any evidence of aortic stenosis. Aortic valve mean gradient measures 3.0 mmHg. Aortic valve peak  gradient measures 5.0 mmHg. Aortic valve area, by VTI measures 3.10 cm. Pulmonic Valve: The pulmonic valve was normal in structure. Pulmonic valve regurgitation is not visualized. No evidence of pulmonic stenosis. Aorta: Aortic dilatation noted. There is moderate dilatation of the aortic root and of the ascending aorta, measuring 45 mm. Venous: The inferior vena cava is dilated in size with less than 50% respiratory variability, suggesting right atrial pressure of 15 mmHg. IAS/Shunts: No atrial level shunt detected by color flow Doppler.  LEFT VENTRICLE PLAX 2D LVIDd:         4.60 cm   Diastology LVIDs:         3.20 cm   LV e' medial:    5.98 cm/s LV PW:         1.40 cm   LV E/e' medial:  11.7 LV IVS:        1.50 cm   LV e' lateral:   11.30 cm/s LVOT diam:     2.20 cm   LV E/e' lateral: 6.2 LV SV:         65 LV SV Index:   29 LVOT Area:     3.80 cm  RIGHT VENTRICLE RV Basal diam:  4.90 cm RV Mid diam:    4.00 cm LEFT ATRIUM             Index        RIGHT ATRIUM           Index LA diam:        3.30 cm 1.47 cm/m   RA Area:  21.10 cm LA Vol (A2C):   59.2 ml 26.37 ml/m  RA Volume:   58.20 ml  25.92 ml/m LA Vol (A4C):   44.9 ml 20.00 ml/m LA Biplane Vol: 53.9 ml 24.01 ml/m  AORTIC VALVE AV Area (Vmax):    3.12 cm AV Area (Vmean):   2.86 cm AV Area (VTI):     3.10 cm AV Vmax:           112.00 cm/s AV Vmean:          78.800 cm/s AV VTI:            0.211 m AV Peak Grad:      5.0 mmHg AV Mean Grad:      3.0 mmHg LVOT Vmax:          91.80 cm/s LVOT Vmean:        59.200 cm/s LVOT VTI:          0.172 m LVOT/AV VTI ratio: 0.82  AORTA Ao Root diam: 4.50 cm MITRAL VALVE                TRICUSPID VALVE MV Area (PHT): 2.29 cm     TR Peak grad:   21.7 mmHg MV Area VTI:   1.87 cm     TR Vmax:        233.00 cm/s MV Peak grad:  6.2 mmHg MV Mean grad:  2.0 mmHg     SHUNTS MV Vmax:       1.24 m/s     Systemic VTI:  0.17 m MV Vmean:      60.6 cm/s    Systemic Diam: 2.20 cm MV Decel Time: 332 msec MV E velocity: 70.20 cm/s MV A velocity: 104.00 cm/s MV E/A ratio:  0.68 Deatrice Cage MD Electronically signed by Deatrice Cage MD Signature Date/Time: 11/29/2023/4:33:12 PM    Final    DG Abd Portable 1V Result Date: 11/29/2023 CLINICAL DATA:  Nasogastric tube placement. EXAM: PORTABLE ABDOMEN - 1 VIEW COMPARISON:  Radiograph yesterday. FINDINGS: Tip and side port of the enteric tube below the diaphragm in the region of the mid stomach. No bowel dilatation in the included upper abdomen. Clearance of excreted IV contrast from the renal collecting systems. IMPRESSION: Tip and side port of the enteric tube below the diaphragm in the region of the mid stomach. Electronically Signed   By: Andrea Gasman M.D.   On: 11/29/2023 13:13   DG Chest Port 1 View Result Date: 11/29/2023 CLINICAL DATA:  Intubation and OG tube placement EXAM: PORTABLE CHEST 1 VIEW COMPARISON:  Same day chest radiograph and CT FINDINGS: Stable cardiomediastinal silhouette. Similar infiltrates in the left mid and lower lung no pleural effusion or pneumothorax. Endotracheal tube tip in the intrathoracic trachea 2.8 cm from the carina. Subdiaphragmatic enteric tube. IMPRESSION: 1. Endotracheal tube tip in the intrathoracic trachea 2.8 cm from the carina. 2. Similar infiltrates in the left mid and lower lung. Electronically Signed   By: Norman Gatlin M.D.   On: 11/29/2023 00:13   DG Abd 1 View Result Date: 11/29/2023 CLINICAL DATA:  ET tube, OG tube placement EXAM: ABDOMEN - 1  VIEW COMPARISON:  None Available. FINDINGS: OG tube tip is in the stomach with the side port near the GE junction. Nonobstructive bowel gas pattern. IMPRESSION: OG tube tip in the stomach. Electronically Signed   By: Franky Crease M.D.   On: 11/29/2023 00:11   CT HEAD WO CONTRAST ( ) Result Date: 11/28/2023 EXAM: CT HEAD WITHOUT  CONTRAST 11/28/2023 10:53:23 PM TECHNIQUE: CT of the head was performed without the administration of intravenous contrast. Automated exposure control, iterative reconstruction, and/or weight based adjustment of the mA/kV was utilized to reduce the radiation dose to as low as reasonably achievable. COMPARISON: None available. CLINICAL HISTORY: Mental status change, unknown cause. FINDINGS: BRAIN AND VENTRICLES: No acute hemorrhage. Gray-white differentiation is preserved. No hydrocephalus. No extra-axial collection. No mass effect or midline shift. Chronic ischemic white matter changes. Subcortical infarct in the left frontal lobe is favored to be chronic. ORBITS: No acute abnormality. SINUSES: No acute abnormality. SOFT TISSUES AND SKULL: No acute soft tissue abnormality. No skull fracture. IMPRESSION: 1. No acute intracranial abnormality. 2. Chronic ischemic white matter changes and subcortical infarct in the left frontal lobe, favored to be chronic. Electronically signed by: Franky Stanford MD 11/28/2023 11:05 PM EDT RP Workstation: [id number]   CT Angio Chest PE W/Cm &/Or Wo Cm Result Date: 11/28/2023 CLINICAL DATA:  Hypoxia and altered mental status. EXAM: CT ANGIOGRAPHY CHEST WITH CONTRAST TECHNIQUE: Multidetector CT imaging of the chest was performed using the standard protocol during bolus administration of intravenous contrast. Multiplanar CT image reconstructions and MIPs were obtained to evaluate the vascular anatomy. RADIATION DOSE REDUCTION: This exam was performed according to the departmental dose-optimization program which includes automated exposure control, adjustment  of the mA and/or kV according to patient size and/or use of iterative reconstruction technique. CONTRAST:  OMNIPAQUE  IOHEXOL  350 MG/ML SOLN COMPARISON:  Same day chest radiograph and CT chest 02/16/2023 FINDINGS: Cardiovascular: Cardiomegaly. No pericardial effusion. Coronary artery and aortic atherosclerotic calcification. Ascending aorta is dilated measuring 47 mm in diameter. No dissection. Dilated main pulmonary artery measuring 44 mm in diameter. Negative for pulmonary embolism. Mediastinum/Nodes: Trachea and esophagus are unremarkable. No pathologic adenopathy. Lungs/Pleura: Peribronchovascular consolidation in the right lower lobe, left lingula, greatest in the left lower lobe. Findings may be due to atelectasis however aspiration or pneumonia could appear similarly. Upper Abdomen: No acute abnormality. Musculoskeletal: Acute or subacute appearing fracture of T10, new since 02/16/2023. There is 25% vertebral body height loss and no retropulsion. Unchanged 2.2 cm soft tissue nodule in the subcutaneous fat of the anterior right upper abdomen, likely representing a sebaceous cyst. Review of the MIP images confirms the above findings. IMPRESSION: 1. Negative for pulmonary embolism. 2. Peribronchovascular consolidation in the right lower lobe, left lingula, greatest in the left lower lobe. Findings may be due to atelectasis however aspiration or pneumonia could appear similarly. 3. Acute or subacute appearing fracture of T10 with 25% vertebral body height loss and no retropulsion. 4. Ascending aortic aneurysm measuring 47 mm in diameter. This is not substantially changed from 02/16/2023 using similar measuring technique. Recommend semi-annual imaging followup by CTA or MRA and referral to cardiothoracic surgery if not already obtained. This recommendation follows 2010 ACCF/AHA/AATS/ACR/ASA/SCA/SCAI/SIR/STS/SVM Guidelines for the Diagnosis and Management of Patients With Thoracic Aortic Disease. Circulation.  2010; 121: [id number]. Aortic aneurysm NOS (ICD10-I71.9) 5. Aortic Atherosclerosis (ICD10-I70.0). Electronically Signed   By: Norman Gatlin M.D.   On: 11/28/2023 21:48   US  Venous Img Lower Bilateral (DVT) Result Date: 11/28/2023 CLINICAL DATA:  Lower extremity swelling EXAM: BILATERAL LOWER EXTREMITY VENOUS DOPPLER ULTRASOUND TECHNIQUE: Gray-scale sonography with compression, as well as color and duplex ultrasound, were performed to evaluate the deep venous system(s) from the level of the common femoral vein through the popliteal and proximal calf veins. COMPARISON:  None Available. FINDINGS: VENOUS Normal compressibility of the common femoral, superficial femoral, and popliteal veins, as  well as the visualized calf veins. Visualized portions of profunda femoral vein and great saphenous vein unremarkable. No filling defects to suggest DVT on grayscale or color Doppler imaging. Doppler waveforms show normal direction of venous flow, normal respiratory plasticity and response to augmentation. OTHER None. Limitations: none IMPRESSION: Negative. Electronically Signed   By: Franky Crease M.D.   On: 11/28/2023 19:08   DG Chest 1 View Result Date: 11/28/2023 CLINICAL DATA:  Hypoxia and altered mental status EXAM: CHEST  1 VIEW COMPARISON:  09/28/2022 FINDINGS: Cardiomegaly and pulmonary vascular congestion. Interstitial coarsening in the lower lungs may be due to edema or infection. No pleural effusion or pneumothorax. Aortic atherosclerotic calcification. IMPRESSION: Cardiomegaly and pulmonary vascular congestion. Interstitial coarsening in the lower lungs, favor edema. Electronically Signed   By: Norman Gatlin M.D.   On: 11/28/2023 18:55    Microbiology: Results for orders placed or performed during the hospital encounter of 11/28/23  MRSA Next Gen by PCR, Nasal     Status: None   Collection Time: 11/28/23 11:28 PM   Specimen: Nasal Mucosa; Nasal Swab  Result Value Ref Range Status   MRSA by PCR Next  Gen NOT DETECTED NOT DETECTED Final    Comment: (NOTE) The GeneXpert MRSA Assay (FDA approved for NASAL specimens only), is one component of a comprehensive MRSA colonization surveillance program. It is not intended to diagnose MRSA infection nor to guide or monitor treatment for MRSA infections. Test performance is not FDA approved in patients less than 80 years old. Performed at [facility], [address] [place], [place] [address]   Culture, Respiratory w Gram Stain     Status: None   Collection Time: 11/29/23  5:13 AM   Specimen: Tracheal Aspirate; Respiratory  Result Value Ref Range Status   Specimen Description   Final    TRACHEAL ASPIRATE Performed at [facility], [address], [place], [place] [address]    Special Requests   Final    NONE Performed at [facility], [address] [place], [place] [address]    Gram Stain   Final    FEW WBC PRESENT, PREDOMINANTLY PMN FEW GRAM POSITIVE COCCI IN PAIRS FEW GRAM NEGATIVE RODS    Culture   Final    FEW Normal respiratory flora-no Staph aureus or Pseudomonas seen Performed at [facility], [address], [place], [place] [address]    Report Status 12/01/2023 FINAL  Final  Respiratory (~20 pathogens) panel by PCR     Status: None   Collection Time: 11/29/23  5:59 PM   Specimen: Nasopharyngeal Swab; Respiratory  Result Value Ref Range Status   Adenovirus NOT DETECTED NOT DETECTED Final   Coronavirus 229E NOT DETECTED NOT DETECTED Final    Comment: (NOTE) The Coronavirus on the Respiratory Panel, DOES NOT test for the novel  Coronavirus (2019 nCoV)    Coronavirus HKU1 NOT DETECTED NOT DETECTED Final   Coronavirus NL63 NOT DETECTED NOT DETECTED Final   Coronavirus OC43 NOT DETECTED NOT DETECTED Final   Metapneumovirus NOT DETECTED NOT DETECTED Final   Rhinovirus / Enterovirus NOT DETECTED NOT DETECTED Final   Influenza A NOT DETECTED NOT DETECTED Final   Influenza B NOT DETECTED  NOT DETECTED Final   Parainfluenza Virus 1 NOT DETECTED NOT DETECTED Final   Parainfluenza Virus 2 NOT DETECTED NOT DETECTED Final   Parainfluenza Virus 3 NOT DETECTED NOT DETECTED Final   Parainfluenza Virus 4 NOT DETECTED NOT DETECTED Final   Respiratory  Syncytial Virus NOT DETECTED NOT DETECTED Final   Bordetella pertussis NOT DETECTED NOT DETECTED Final   Bordetella Parapertussis NOT DETECTED NOT DETECTED Final   Chlamydophila pneumoniae NOT DETECTED NOT DETECTED Final   Mycoplasma pneumoniae NOT DETECTED NOT DETECTED Final    Comment: Performed at [facility], [address] [place], [place] [address]    Labs: CBC: Recent Labs  Lab 11/30/23 0543 12/01/23 0558 12/02/23 0431 12/03/23 0404 12/04/23 0515  WBC 9.5 9.5 9.3 8.0 8.3  HGB 16.3 16.3 16.6 16.3 16.2  HCT 50.3 51.2 53.9* 51.3 51.4  MCV 96.5 97.5 99.6 97.5 97.3  PLT 199 178 170 168 174   Basic Metabolic Panel: Recent Labs  Lab 11/30/23 0543 12/01/23 0558 12/02/23 0431 12/03/23 0404 12/04/23 0515  NA 138 141 140 140 140  K 3.8 4.4 4.9 4.5 4.2  CL 93* 98 99 95* 96*  CO2 34* 34* 32 37* 34*  GLUCOSE 155* 121* 158* 107* 104*  BUN 29* 20 24* 22 22  CREATININE 0.92 0.73 0.62 0.68 0.73  CALCIUM  8.6* 8.7* 9.0 9.4 9.3  MG 2.5* 2.2 2.2 2.3  --   PHOS 3.8 2.8 2.6 3.2 3.3   Liver Function Tests: Recent Labs  Lab 11/30/23 0543 12/01/23 0558 12/02/23 0431 12/03/23 0404 12/04/23 0515  ALBUMIN 2.9* 2.8* 3.0* 3.0* 2.8*   CBG: Recent Labs  Lab 12/03/23 2313 12/04/23 0325 12/04/23 0732 12/04/23 1151 12/04/23 1600  GLUCAP 81 144* 94 116* 229*    Discharge time spent: greater than 30 minutes.  Signed: Alban Pepper, MD [facility] 12/06/2023

## 2023-12-07 ENCOUNTER — Encounter: Payer: Self-pay | Admitting: Internal Medicine

## 2023-12-07 ENCOUNTER — Telehealth: Payer: Self-pay

## 2023-12-07 ENCOUNTER — Other Ambulatory Visit: Payer: Self-pay

## 2023-12-07 ENCOUNTER — Ambulatory Visit (INDEPENDENT_AMBULATORY_CARE_PROVIDER_SITE_OTHER): Admitting: Internal Medicine

## 2023-12-07 VITALS — BP 132/74 | HR 79 | Temp 98.5°F | Resp 18 | Ht 75.0 in | Wt 217.0 lb

## 2023-12-07 DIAGNOSIS — Z09 Encounter for follow-up examination after completed treatment for conditions other than malignant neoplasm: Secondary | ICD-10-CM

## 2023-12-07 DIAGNOSIS — J449 Chronic obstructive pulmonary disease, unspecified: Secondary | ICD-10-CM | POA: Diagnosis not present

## 2023-12-07 DIAGNOSIS — J9611 Chronic respiratory failure with hypoxia: Secondary | ICD-10-CM | POA: Diagnosis not present

## 2023-12-07 DIAGNOSIS — I10 Essential (primary) hypertension: Secondary | ICD-10-CM

## 2023-12-07 DIAGNOSIS — E782 Mixed hyperlipidemia: Secondary | ICD-10-CM

## 2023-12-07 DIAGNOSIS — I5032 Chronic diastolic (congestive) heart failure: Secondary | ICD-10-CM

## 2023-12-07 MED ORDER — DAPAGLIFLOZIN PROPANEDIOL 10 MG PO TABS
10.0000 mg | ORAL_TABLET | Freq: Every day | ORAL | 1 refills | Status: DC
Start: 2023-12-07 — End: 2023-12-23

## 2023-12-07 MED ORDER — METOPROLOL SUCCINATE ER 100 MG PO TB24: 100.0000 mg | ORAL_TABLET | Freq: Every day | ORAL | 1 refills | Status: DC

## 2023-12-07 NOTE — Patient Instructions (Addendum)
 It was great seeing you today!  Plan discussed at today's visit: -Blood work ordered today, results will be uploaded to MyChart.  -For Prednisone : take 2 pills tomorrow, then take 1 pill once a day for 4 days, then cut pills in half, take 1/2 pill once daily for 4 days  Follow up in: 2-3 weeks, bring medications again and pill box please  Take care and let us  know if you have any questions or concerns prior to your next visit.  Dr. Bernardo

## 2023-12-07 NOTE — Progress Notes (Signed)
 Established Patient Office Visit  Subjective    Patient ID: Joshua Schmidt, male    DOB: 03/04/1951  Age: 72 y.o. MRN: [id number]  CC:  Chief Complaint  Patient presents with   Hospitalization Follow-up    HPI Joshua Schmidt presents for hospital follow up.   Discharge Date: 12/04/23 Diagnosis: acute on chronic hypoxic respiratory failure Procedures/tests: CT head negative. CTA negative for PE, required BiPAP and then was sedated and intubated for 2 days. Labs consistent with respiratory acidosis, elevated BNP and mild transaminitis and elevated troponin. Echo with EF 6-65%. Treated with Unasyn  and Azithromycin , Augmentin  New medications: Farxiga , Metoprolol  increased to 100 mg daily, Prednisone  taper Discontinued medications: None Discharge instructions:  Follow up with PCP Status: stable   Discussed the use of AI scribe software for clinical note transcription with the patient, who gave verbal consent to proceed.  History of Present Illness Joshua Schmidt is a 73 year old male with heart failure who presents with a recent hospitalization due to severe hypoxia and altered mental status.  He experienced a sudden relapse at home, leading to severe hypoxia and altered mental status. His last memory before hospitalization was being at home. He was incoherent and required assistance to be moved indoors. He was intubated due to inability to maintain his airway.  During hospitalization, he was treated for aspiration pneumonia and received diuretics. Laboratory results showed elevated liver enzymes and troponin levels. He was started on Farxiga  for heart failure management.  His current medications include atorvastatin , aspirin , metoprolol  succinate, losartan , hydrochlorothiazide , prednisone , and Farxiga . He uses a pill organizer to manage his medications. He reports gradual cognitive recovery over two to three days post-intubation but does not feel completely back to  baseline.  Hypertension: -Medications: Coreg  switched to Metoprolol  100 mg,  HCTZ 25 mg, Losartan  50 mg -Patient is compliant with above medications and reports no side effects.   -Failed Meds: Lisinopril  due to facial swelling -Checking BP at home (average): 132-140/80 -Denies any SOB, CP, vision changes or symptoms of hypotension - does have BLE edema but fairly well controlled  -Following with Cardiology  HLD/History of MI/Aortic and Iliac Aneurysms/Chronic Venous Insufficiency:   -Medications: Lipitor 20 mg, aspirin  81 mg -Patient is compliant with above medications and reports no side effects.  -History of MI in 2022 for which he did not seek medical attention at the time, was diagnosed retroactively  -CTA 7/24 with dilated ascending thoracic aorta 4.5 cm, iliac duplex US  8/24 with 4 cm AAA, 3.4 cm right common iliac artery aneurysm without significant growth. - Per Cardiology note, patient no longer a candidate for surgical fixation now that he is oxygen  dependent so routine imaging is no longer required.  Of note, ascending thoracic aortic aneurysm minimally progressive to 4.7 cm on lung CT 11/24 -Last lipid panel: Lipid Panel     Component Value Date/Time   CHOL 97 11/29/2023 0345   TRIG 109 11/29/2023 0345   HDL 29 (L) 11/29/2023 0345   CHOLHDL 3.3 11/29/2023 0345   VLDL 22 11/29/2023 0345   LDLCALC 46 11/29/2023 0345   LDLCALC 54 10/26/2023 0941   COPD: -COPD status: better -Current medications: Breztri  and Albuterol , finished Prednisone  taper -Oxygen  use: yes -requiring 2 L supplemental oxygen  but has been able to maintain oxygen  saturations of 90% or above at rest or with minimal activity -Dyspnea frequency: Occasional  -Cough frequency: Daily, productive  -Limitation of activity: no -Pneumovax: Up to Date -Influenza: Up to Date -  Annual lung cancer screening: 11/24 Lung-RADS-2  -Stopped smoking since hospitalization -Now following with Pulmonology, last seen  06/24/23  PTSD: -Not currently on medication -Had done counseling in the past but not interested it anything now  Health Maintance: -Blood work due -Colon cancer screening: colonoscopy 12/23, 3 polyps removed, plan to follow up in 1 year. Referral placed previously  Outpatient Encounter Medications as of 12/07/2023  Medication Sig   aspirin  81 MG chewable tablet Chew 81 mg by mouth daily.   atorvastatin  (LIPITOR) 20 MG tablet Take 1 tablet (20 mg total) by mouth daily.   budeson-glycopyrrolate -formoterol  (BREZTRI  AEROSPHERE) 160-9-4.8 MCG/ACT AERO inhaler Inhale 2 puffs into the lungs in the morning and at bedtime.   dapagliflozin  propanediol (FARXIGA ) 10 MG TABS tablet Take 1 tablet (10 mg total) by mouth daily.   fluticasone  (FLONASE ) 50 MCG/ACT nasal spray Place 2 sprays into both nostrils daily.   folic acid  (FOLVITE ) 1 MG tablet Take 1 tablet (1 mg total) by mouth daily.   hydrochlorothiazide  (HYDRODIURIL ) 25 MG tablet Take 1 tablet (25 mg total) by mouth daily.   losartan  (COZAAR ) 50 MG tablet TAKE 1 TABLET BY MOUTH DAILY   metoprolol  succinate (TOPROL -XL) 100 MG 24 hr tablet Take 1 tablet (100 mg total) by mouth daily. Take with or immediately following a meal.   Multiple Vitamin (MULTIVITAMIN WITH MINERALS) TABS tablet Take 1 tablet by mouth daily.   predniSONE  (DELTASONE ) 20 MG tablet Take 2 tablets (40 mg total) by mouth daily with breakfast for 3 days, THEN 1 tablet (20 mg total) daily with breakfast for 4 days, THEN 0.5 tablets (10 mg total) daily with breakfast for 4 days.   VENTOLIN  HFA 108 (90 Base) MCG/ACT inhaler USE 2 INHALATIONS BY MOUTH EVERY 6 HOURS AS NEEDED FOR WHEEZING  OR SHORTNESS OF BREATH   No facility-administered encounter medications on file as of 12/07/2023.    Past Medical History:  Diagnosis Date   Aneurysm (HCC)    COPD (chronic obstructive pulmonary disease) (HCC)    Coronary artery disease    Erythrocytosis 12/01/2018   Hyperlipidemia     Hypertension    Myocardial infarction [facility])     Past Surgical History:  Procedure Laterality Date   APPENDECTOMY     COLONOSCOPY WITH PROPOFOL  N/A 11/20/2014   Procedure: COLONOSCOPY WITH PROPOFOL ;  Surgeon: Louanne KANDICE Muse, MD;  Location: [facility] ENDOSCOPY;  Service: Endoscopy;  Laterality: N/A;   COLONOSCOPY WITH PROPOFOL  N/A 03/19/2022   Procedure: COLONOSCOPY WITH PROPOFOL ;  Surgeon: Unk Corinn Skiff, MD;  Location: [facility] ENDOSCOPY;  Service: Gastroenterology;  Laterality: N/A;   TONSILLECTOMY AND ADENOIDECTOMY      No family history on file.  Social History   Socioeconomic History   Marital status: Married    Spouse name: Not on file   Number of children: Not on file   Years of education: Not on file   Highest education level: Not on file  Occupational History   Not on file  Tobacco Use   Smoking status: Former    Current packs/day: 0.00    Average packs/day: 1 pack/day for 51.0 years (51.0 ttl pk-yrs)    Types: Cigarettes    Start date: 10/01/1971    Quit date: 10/01/2022    Years since quitting: 1.1   Smokeless tobacco: Never  Vaping Use   Vaping status: Never Used  Substance and Sexual Activity   Alcohol use: Yes    Comment: evenings   Drug use: No   Sexual activity:  Not Currently  Other Topics Concern   Not on file  Social History Narrative   Not on file   Social Drivers of Health   Financial Resource Strain: Low Risk  (10/21/2023)   Overall Financial Resource Strain (CARDIA)    Difficulty of Paying Living Expenses: Not hard at all  Food Insecurity: No Food Insecurity (10/21/2023)   Hunger Vital Sign    Worried About Running Out of Food in the Last Year: Never true    Ran Out of Food in the Last Year: Never true  Transportation Needs: No Transportation Needs (10/21/2023)   PRAPARE - Administrator, Civil Service (Medical): No    Lack of Transportation (Non-Medical): No  Physical Activity: Sufficiently Active (10/21/2023)   Exercise Vital  Sign    Days of Exercise per Week: 3 days    Minutes of Exercise per Session: 60 min  Stress: No Stress Concern Present (10/21/2023)   [employer] of Occupational Health - Occupational Stress Questionnaire    Feeling of Stress: Only a little  Social Connections: Moderately Integrated (10/21/2023)   Social Connection and Isolation Panel    Frequency of Communication with Friends and Family: More than three times a week    Frequency of Social Gatherings with Friends and Family: Twice a week    Attends Religious Services: More than 4 times per year    Active Member of [employer] or Organizations: No    Attends Banker Meetings: Never    Marital Status: Married  Catering manager Violence: Not At Risk (10/21/2023)   Humiliation, Afraid, Rape, and Kick questionnaire    Fear of Current or Ex-Partner: No    Emotionally Abused: No    Physically Abused: No    Sexually Abused: No    Review of Systems  All other systems reviewed and are negative.       Objective    BP 132/74 (Cuff Size: Large)   Pulse 79   Temp 98.5 F (36.9 C) (Oral)   Resp 18   Ht 6' 3 (1.905 m)   Wt 217 lb (98.4 kg)   SpO2 96%   BMI 27.12 kg/m   Physical Exam Constitutional:      Appearance: Normal appearance.  HENT:     Head: Normocephalic and atraumatic.  Eyes:     Conjunctiva/sclera: Conjunctivae normal.  Cardiovascular:     Rate and Rhythm: Normal rate and regular rhythm.  Pulmonary:     Effort: Pulmonary effort is normal.     Breath sounds: Normal breath sounds. No wheezing, rhonchi or rales.     Comments: On 2 L [place] Musculoskeletal:     Right lower leg: Edema present.     Left lower leg: Edema present.     Comments: Mild BLE pitting edema 1+  Skin:    General: Skin is warm and dry.  Neurological:     General: No focal deficit present.     Mental Status: He is alert. Mental status is at baseline.  Psychiatric:        Mood and Affect: Mood normal.        Behavior: Behavior  normal.     Last CBC Lab Results  Component Value Date   WBC 8.3 12/04/2023   HGB 16.2 12/04/2023   HCT 51.4 12/04/2023   MCV 97.3 12/04/2023   MCH 30.7 12/04/2023   RDW 14.0 12/04/2023   PLT 174 12/04/2023   Last metabolic panel Lab Results  Component Value Date  GLUCOSE 104 (H) 12/04/2023   NA 140 12/04/2023   K 4.2 12/04/2023   CL 96 (L) 12/04/2023   CO2 34 (H) 12/04/2023   BUN 22 12/04/2023   CREATININE 0.73 12/04/2023   GFRNONAA >60 12/04/2023   CALCIUM  9.3 12/04/2023   PHOS 3.3 12/04/2023   PROT 6.1 (L) 11/29/2023   ALBUMIN 2.8 (L) 12/04/2023   BILITOT 0.8 11/29/2023   ALKPHOS 56 11/29/2023   AST 37 11/29/2023   ALT 63 (H) 11/29/2023   ANIONGAP 10 12/04/2023   Last lipids Lab Results  Component Value Date   CHOL 97 11/29/2023   HDL 29 (L) 11/29/2023   LDLCALC 46 11/29/2023   TRIG 109 11/29/2023   CHOLHDL 3.3 11/29/2023   Last hemoglobin A1c Lab Results  Component Value Date   HGBA1C 5.4 09/28/2022   Last thyroid  functions Lab Results  Component Value Date   TSH 0.292 (L) 11/29/2023   T3TOTAL 56 (L) 11/29/2023   Last vitamin D No results found for: 25OHVITD2, 25OHVITD3, VD25OH Last vitamin B12 and Folate Lab Results  Component Value Date   VITAMINB12 839 11/20/2022   FOLATE >24.0 11/20/2022        Assessment & Plan:   Assessment & Plan Acute hypoxic respiratory failure requiring intubation Intubation required due to severe hypoxia; improved post-intubation.  Heart failure exacerbation Exacerbation with cardiac stress; transitioned to metoprolol  succinate and initiated Farxiga . Discussed Farxiga  side effects. - Continue metoprolol  succinate as prescribed. - Continue losartan  and hydrochlorothiazide  for blood pressure management. - Start Farxiga  for heart failure management. - Monitor for signs of urinary tract infections and genital yeast infections due to Farxiga .  Aspiration pneumonia, treated  Chronic lung disease  requiring home oxygen  and inhalers Managed with home oxygen  and inhalers; recent exacerbation required intubation. - Continue home oxygen  therapy. - Use inhalers as prescribed.  Hypertension Controlled with losartan  and hydrochlorothiazide . - Continue losartan  and hydrochlorothiazide  as prescribed.  Hyperlipidemia Managed with atorvastatin . - Continue atorvastatin  as prescribed.  Follow-Up Follow-up needed to ensure recovery and stability post-hospitalization. - Schedule follow-up appointment in 2-3 weeks to reassess condition and review lab results. - Perform lab tests to monitor recovery and medication effects.  - dapagliflozin  propanediol (FARXIGA ) 10 MG TABS tablet; Take 1 tablet (10 mg total) by mouth daily.  Dispense: 90 tablet; Refill: 1 - metoprolol  succinate (TOPROL -XL) 100 MG 24 hr tablet; Take 1 tablet (100 mg total) by mouth daily. Take with or immediately following a meal.  Dispense: 90 tablet; Refill: 1  Return in about 2 weeks (around 12/21/2023).   Sharyle Fischer, DO

## 2023-12-08 ENCOUNTER — Ambulatory Visit: Payer: Self-pay | Admitting: Internal Medicine

## 2023-12-08 LAB — COMPREHENSIVE METABOLIC PANEL WITH GFR
AG Ratio: 1.3 (calc) (ref 1.0–2.5)
ALT: 43 U/L (ref 9–46)
AST: 21 U/L (ref 10–35)
Albumin: 3.6 g/dL (ref 3.6–5.1)
Alkaline phosphatase (APISO): 56 U/L (ref 35–144)
BUN: 20 mg/dL (ref 7–25)
CO2: 35 mmol/L — ABNORMAL HIGH (ref 20–32)
Calcium: 9 mg/dL (ref 8.6–10.3)
Chloride: 101 mmol/L (ref 98–110)
Creat: 0.96 mg/dL (ref 0.70–1.28)
Globulin: 2.8 g/dL (ref 1.9–3.7)
Glucose, Bld: 102 mg/dL — ABNORMAL HIGH (ref 65–99)
Potassium: 4.3 mmol/L (ref 3.5–5.3)
Sodium: 143 mmol/L (ref 135–146)
Total Bilirubin: 0.9 mg/dL (ref 0.2–1.2)
Total Protein: 6.4 g/dL (ref 6.1–8.1)
eGFR: 83 mL/min/1.73m2 (ref >=60)

## 2023-12-08 LAB — CBC WITH DIFFERENTIAL/PLATELET
Absolute Lymphocytes: 475 {cells}/uL — ABNORMAL LOW (ref 850–3900)
Absolute Monocytes: 324 {cells}/uL (ref 200–950)
Basophils Absolute: 22 {cells}/uL (ref 0–200)
Basophils Relative: 0.2 %
Eosinophils Absolute: 22 {cells}/uL (ref 15–500)
Eosinophils Relative: 0.2 %
HCT: 54.1 % — ABNORMAL HIGH (ref 38.5–50.0)
Hemoglobin: 17.2 g/dL — ABNORMAL HIGH (ref 13.2–17.1)
MCH: 30.7 pg (ref 27.0–33.0)
MCHC: 31.8 g/dL — ABNORMAL LOW (ref 32.0–36.0)
MCV: 96.4 fL (ref 80.0–100.0)
MPV: 10 fL (ref 7.5–12.5)
Monocytes Relative: 3 %
Neutro Abs: 9958 {cells}/uL — ABNORMAL HIGH (ref 1500–7800)
Neutrophils Relative %: 92.2 %
Platelets: 242 Thousand/uL (ref 140–400)
RBC: 5.61 Million/uL (ref 4.20–5.80)
RDW: 13.1 % (ref 11.0–15.0)
Total Lymphocyte: 4.4 %
WBC: 10.8 Thousand/uL (ref 3.8–10.8)

## 2023-12-09 ENCOUNTER — Telehealth: Payer: Self-pay | Admitting: Family

## 2023-12-09 NOTE — Telephone Encounter (Signed)
 Called to confirm/remind patient of their appointment at the [facility] on 12/10/23.   Appointment:   [] Confirmed  [x] Left mess   [] No answer/No voice mail  [] VM Full/unable to leave message  [] Phone not in service  Patient reminded to bring all medications and/or complete list.  Confirmed patient has transportation. Gave directions, instructed to utilize valet parking.

## 2023-12-09 NOTE — Progress Notes (Unsigned)
 [facility] Note   Referring Physician: 08/25 admission PCP: Bernardo Fend, DO Cardiologist: Deatrice Cage, MD   Chief Complaint: HF visit   HPI:  Mr Cott is a 73 y/o male with a history of COPD on 4 L at baseline, CAD status post MI, hypertension, hyperlipidemia, aortic and iliac aneurysms, chronic venous insufficiency, iron deficiency anemia, obstructive sleep apnea, HFpEF, PTSD, tobacco/ alcohol use, DVT without anticoagulation although vascular surgery note from 12/2020 shows that DVT was never confirmed.    Echo June 2024: EF of 60 to 65% mild LVH   Admitted 11/28/23 with hypoxia and AMS. Upon arrival patient hypoxic in triage at 74% on 2 L [place], this was increased to 4 L then 6 L but still at 83%. After adjustment to NRB 15 L the patient recovered to 91%. Treated for COPD/ HF exacerbation. Due to continued hypoxia, placed on bipap and then subsequently intubated. CT angio done due to history of DVT and was negative for PE but concern for possible aspiration pneumonitis. Elevated BNP/ troponin. CT head w/o contrast with no acute abnormality. No evidence of swelling/edema on imaging. Elevated troponin thought to be due to COPD exacerbation. Echo 11/29/23: EF 55-60%, moderate LVH, G1DD, normal RV, moderate dilatation of the aortic root and of the ascending aorta, measuring 45 mm.   He presents today with a chief complaint of a HF visit. Denies shortness of breath, fatigue, chest pain, edema, palpitations or dizziness. Wearing bipap/ oxygen  at bedtime since recent discharge. Is wearing his oxygen  around the clock although occasionally takes it off. Denies any SOB when he removes it. Has been working on re-doing his deck at home and says that physically he feels fine when he's working on it.   Stopped smoking at recent admission. Drinking 1-2 cans (12 oz) beer nightly. On weekends up to 5-6 beers / night. Denies any drug use.    Adding very little salt. Rarely eats out  and is weighing himself daily. Drinks 1-2 pots of caffeine coffee daily  Review of Systems: [y] = yes, [ ]  = no   General: Weight gain [ ] ; Weight loss [ ] ; Anorexia [ ] ; Fatigue [ ] ; Fever [ ] ; Chills [ ] ; Weakness [ ]   Cardiac: Chest pain/pressure [ ] ; Resting SOB [ ] ; Exertional SOB [ ] ; Orthopnea [ ] ; Pedal Edema [ ] ; Palpitations [ ] ; Syncope [ ] ; Presyncope [ ] ; Paroxysmal nocturnal dyspnea[ ]   Pulmonary: Cough [ ] ; Wheezing[ ] ; Hemoptysis[ ] ; Sputum [ ] ; Snoring [ ]   GI: Vomiting[ ] ; Dysphagia[ ] ; Melena[ ] ; Hematochezia [ ] ; Heartburn[ ] ; Abdominal pain [ ] ; Constipation [ ] ; Diarrhea [ ] ; BRBPR [ ]   GU: Hematuria[ ] ; Dysuria [ ] ; Nocturia[ ]   Vascular: Pain in legs with walking [ ] ; Pain in feet with lying flat [ ] ; Non-healing sores [ ] ; Stroke [ ] ; TIA [ ] ; Slurred speech [ ] ;  Neuro: Headaches[ ] ; Vertigo[ ] ; Seizures[ ] ; Paresthesias[ ] ;Blurred vision [ ] ; Diplopia [ ] ; Vision changes [ ]   Ortho/Skin: Arthritis [ ] ; Joint pain [ ] ; Muscle pain [ ] ; Joint swelling [ ] ; Back Pain [ ] ; Rash [ ]   Psych: Depression[ ] ; Anxiety[ ]   Heme: Bleeding problems [ ] ; Clotting disorders [ ] ; Anemia Davis.Dad ]  Endocrine: Diabetes [ ] ; Thyroid  dysfunction[ ]    Past Medical History:  Diagnosis Date   Aneurysm (HCC)    COPD (chronic obstructive pulmonary disease) (HCC)    Coronary artery disease  Erythrocytosis 12/01/2018   Hyperlipidemia    Hypertension    Myocardial infarction [facility])     Current Outpatient Medications  Medication Sig Dispense Refill   aspirin  81 MG chewable tablet Chew 81 mg by mouth daily.     atorvastatin  (LIPITOR) 20 MG tablet Take 1 tablet (20 mg total) by mouth daily. 100 tablet 2   budeson-glycopyrrolate -formoterol  (BREZTRI  AEROSPHERE) 160-9-4.8 MCG/ACT AERO inhaler Inhale 2 puffs into the lungs in the morning and at bedtime. 10.7 g 11   dapagliflozin  propanediol (FARXIGA ) 10 MG TABS tablet Take 1 tablet (10 mg total) by mouth daily. 90 tablet 1   fluticasone   (FLONASE ) 50 MCG/ACT nasal spray Place 2 sprays into both nostrils daily. 16 g 6   folic acid  (FOLVITE ) 1 MG tablet Take 1 tablet (1 mg total) by mouth daily. 90 tablet 0   hydrochlorothiazide  (HYDRODIURIL ) 25 MG tablet Take 1 tablet (25 mg total) by mouth daily. 90 tablet 1   losartan  (COZAAR ) 50 MG tablet TAKE 1 TABLET BY MOUTH DAILY 100 tablet 2   metoprolol  succinate (TOPROL -XL) 100 MG 24 hr tablet Take 1 tablet (100 mg total) by mouth daily. Take with or immediately following a meal. 90 tablet 1   Multiple Vitamin (MULTIVITAMIN WITH MINERALS) TABS tablet Take 1 tablet by mouth daily. 90 tablet 0   predniSONE  (DELTASONE ) 20 MG tablet Take 2 tablets (40 mg total) by mouth daily with breakfast for 3 days, THEN 1 tablet (20 mg total) daily with breakfast for 4 days, THEN 0.5 tablets (10 mg total) daily with breakfast for 4 days. 12 tablet 0   VENTOLIN  HFA 108 (90 Base) MCG/ACT inhaler USE 2 INHALATIONS BY MOUTH EVERY 6 HOURS AS NEEDED FOR WHEEZING  OR SHORTNESS OF BREATH 54 g 3   No current facility-administered medications for this visit.    No Known Allergies    Social History   Socioeconomic History   Marital status: Married    Spouse name: Not on file   Number of children: Not on file   Years of education: Not on file   Highest education level: Not on file  Occupational History   Not on file  Tobacco Use   Smoking status: Former    Current packs/day: 0.00    Average packs/day: 1 pack/day for 51.0 years (51.0 ttl pk-yrs)    Types: Cigarettes    Start date: 10/01/1971    Quit date: 10/01/2022    Years since quitting: 1.1   Smokeless tobacco: Never  Vaping Use   Vaping status: Never Used  Substance and Sexual Activity   Alcohol use: Yes    Comment: evenings   Drug use: No   Sexual activity: Not Currently  Other Topics Concern   Not on file  Social History Narrative   Not on file   Social Drivers of Health   Financial Resource Strain: Low Risk  (10/21/2023)   Overall  Financial Resource Strain (CARDIA)    Difficulty of Paying Living Expenses: Not hard at all  Food Insecurity: No Food Insecurity (10/21/2023)   Hunger Vital Sign    Worried About Running Out of Food in the Last Year: Never true    Ran Out of Food in the Last Year: Never true  Transportation Needs: No Transportation Needs (10/21/2023)   PRAPARE - Administrator, Civil Service (Medical): No    Lack of Transportation (Non-Medical): No  Physical Activity: Sufficiently Active (10/21/2023)   Exercise Vital Sign    Days  of Exercise per Week: 3 days    Minutes of Exercise per Session: 60 min  Stress: No Stress Concern Present (10/21/2023)   [employer] of Occupational Health - Occupational Stress Questionnaire    Feeling of Stress: Only a little  Social Connections: Moderately Integrated (10/21/2023)   Social Connection and Isolation Panel    Frequency of Communication with Friends and Family: More than three times a week    Frequency of Social Gatherings with Friends and Family: Twice a week    Attends Religious Services: More than 4 times per year    Active Member of [employer] or Organizations: No    Attends Banker Meetings: Never    Marital Status: Married  Catering manager Violence: Not At Risk (10/21/2023)   Humiliation, Afraid, Rape, and Kick questionnaire    Fear of Current or Ex-Partner: No    Emotionally Abused: No    Physically Abused: No    Sexually Abused: No     No family history on file.  Vitals:   12/10/23 1406  BP: 110/69  Pulse: 63  SpO2: 92%  Weight: 215 lb 6.4 oz (97.7 kg)   Wt Readings from Last 3 Encounters:  12/10/23 215 lb 6.4 oz (97.7 kg)  12/07/23 217 lb (98.4 kg)  12/04/23 233 lb 11 oz (106 kg)   Lab Results  Component Value Date   CREATININE 0.96 12/07/2023   CREATININE 0.73 12/04/2023   CREATININE 0.68 12/03/2023    PHYSICAL EXAM:  General: Well appearing.  Cor: No JVD. Regular rhythm, rate.  Lungs: clear Abdomen:  soft, nontender, nondistended. Extremities: 2+ soft pitting edema bilateral lower legs with L>R Neuro:. Affect pleasant   ECG: not done   ASSESSMENT & PLAN:  1: HFpEF- - suspect due to previous MI, severe COPD, alcohol use - NYHA class I - euvolemic - weighing daily. Reviewed parameters to call about - Echo 06/ 2024: EF of 60 to 65% mild LVH  - Echo 11/29/23: EF 55-60%, moderate LVH, G1DD, normal RV, moderate dilatation of the aortic root and of the ascending aorta, measuring 45 mm.  - continue farxiga  10mg  daily - continue hydrochlorothiazide  25mg  daily - continue losartan  50mg  daily.  - continue metoprolol  succinate 100mg  daily - begin spironolactone  12.5mg  daily. BMET in 1 week and then in 1 month at next OV - Wear compression socks daily with removal at bedtime - discussed decreasing coffee intake (currently drinking 1-2 pots) and substituting water  for the coffee. Also need to decrease his daily intake to 60-64 ounces - doesn't eat out much  - BNP 11/28/23 was 752.8  2: HTN- - BP 110/69 - saw PCP Neill) 08/25 - BMET 12/07/23 reviewed: sodium 143, potassium 4.3, creatinine 0.96 & GFR 83 - BMET in 1 week  3: Aortic and iliac aneurysms- - saw cardiology Marsa) 11/24 - saw vascular 05/25  4: HLD- - LDL 11/29/23 was 46 - continue atorvastatin  20mg  daily  5: COPD stage 3- - saw pulmonology Herlene) 03/25 - currently on prednisone  taper  6: Tobacco use- - hasn't smoked since recent admission - drinks 1-2 cans (12oz) of beer nightly. Weekends it can be up to 5-6 beers. Reviewed the importance of decreasing alcohol intake.    Return in 1 month, sooner if needed.   Ellouise DELENA Class, FNP 12/09/23

## 2023-12-10 ENCOUNTER — Encounter: Payer: Self-pay | Admitting: Family

## 2023-12-10 ENCOUNTER — Ambulatory Visit: Attending: Family | Admitting: Family

## 2023-12-10 VITALS — BP 110/69 | HR 63 | Wt 215.4 lb

## 2023-12-10 DIAGNOSIS — I723 Aneurysm of iliac artery: Secondary | ICD-10-CM | POA: Diagnosis not present

## 2023-12-10 DIAGNOSIS — D509 Iron deficiency anemia, unspecified: Secondary | ICD-10-CM | POA: Insufficient documentation

## 2023-12-10 DIAGNOSIS — I872 Venous insufficiency (chronic) (peripheral): Secondary | ICD-10-CM | POA: Insufficient documentation

## 2023-12-10 DIAGNOSIS — F4310 Post-traumatic stress disorder, unspecified: Secondary | ICD-10-CM | POA: Insufficient documentation

## 2023-12-10 DIAGNOSIS — Z720 Tobacco use: Secondary | ICD-10-CM | POA: Diagnosis not present

## 2023-12-10 DIAGNOSIS — Z86718 Personal history of other venous thrombosis and embolism: Secondary | ICD-10-CM | POA: Insufficient documentation

## 2023-12-10 DIAGNOSIS — G4733 Obstructive sleep apnea (adult) (pediatric): Secondary | ICD-10-CM | POA: Insufficient documentation

## 2023-12-10 DIAGNOSIS — I7121 Aneurysm of the ascending aorta, without rupture: Secondary | ICD-10-CM | POA: Diagnosis not present

## 2023-12-10 DIAGNOSIS — I110 Hypertensive heart disease with heart failure: Secondary | ICD-10-CM | POA: Diagnosis not present

## 2023-12-10 DIAGNOSIS — E782 Mixed hyperlipidemia: Secondary | ICD-10-CM | POA: Diagnosis not present

## 2023-12-10 DIAGNOSIS — J449 Chronic obstructive pulmonary disease, unspecified: Secondary | ICD-10-CM

## 2023-12-10 DIAGNOSIS — I252 Old myocardial infarction: Secondary | ICD-10-CM | POA: Insufficient documentation

## 2023-12-10 DIAGNOSIS — F1090 Alcohol use, unspecified, uncomplicated: Secondary | ICD-10-CM | POA: Insufficient documentation

## 2023-12-10 DIAGNOSIS — E785 Hyperlipidemia, unspecified: Secondary | ICD-10-CM | POA: Diagnosis not present

## 2023-12-10 DIAGNOSIS — Z87891 Personal history of nicotine dependence: Secondary | ICD-10-CM | POA: Insufficient documentation

## 2023-12-10 DIAGNOSIS — I2510 Atherosclerotic heart disease of native coronary artery without angina pectoris: Secondary | ICD-10-CM | POA: Insufficient documentation

## 2023-12-10 DIAGNOSIS — I5032 Chronic diastolic (congestive) heart failure: Secondary | ICD-10-CM

## 2023-12-10 DIAGNOSIS — I10 Essential (primary) hypertension: Secondary | ICD-10-CM

## 2023-12-10 DIAGNOSIS — Z7984 Long term (current) use of oral hypoglycemic drugs: Secondary | ICD-10-CM | POA: Diagnosis not present

## 2023-12-10 DIAGNOSIS — Z9981 Dependence on supplemental oxygen: Secondary | ICD-10-CM | POA: Diagnosis not present

## 2023-12-10 DIAGNOSIS — Z79899 Other long term (current) drug therapy: Secondary | ICD-10-CM | POA: Diagnosis not present

## 2023-12-10 MED ORDER — SPIRONOLACTONE 25 MG PO TABS: 12.5000 mg | ORAL_TABLET | Freq: Every day | ORAL | 3 refills | Status: DC

## 2023-12-10 NOTE — Patient Instructions (Signed)
 Medication Changes:  START Spironolactone  12.5mg  (1/2 tab) daily  Lab Work:  Go over to the [facility]. Go pass the gift shop and have your blood work completed IN ONE WEEK.  We will only call you if the results are abnormal or if the provider would like to make medication changes.  No news is good news.   Special Instructions // Education:  Wear compression socks daily and remove at bedtime. Drink less coffee and more water .  Follow-Up in: Please follow up with the [facility] in 1 month with Ellouise Class, FNP.   Thank you for choosing [facility].    At the [facility], you and your health needs are our priority. We have a designated team specialized in the treatment of Heart Failure. This Care Team includes your primary Heart Failure Specialized Cardiologist (physician), Advanced Practice Providers (APPs- Physician Assistants and Nurse Practitioners), and Pharmacist who all work together to provide you with the care you need, when you need it.   You may see any of the following providers on your designated Care Team at your next follow up:  Dr. Toribio Fuel Dr. Ezra Shuck Dr. Ria Commander Dr. Morene Brownie Ellouise Class, FNP Jaun Bash, RPH-CPP  Please be sure to bring in all your medications bottles to every appointment.   Need to Contact Us :  If you have any questions or concerns before your next appointment please send us  a message through [place] or call our office at [phone].    TO LEAVE A MESSAGE FOR THE NURSE SELECT OPTION 2, PLEASE LEAVE A MESSAGE INCLUDING: YOUR NAME DATE OF BIRTH CALL BACK NUMBER REASON FOR CALL**this is important as we prioritize the call backs  YOU WILL RECEIVE A CALL BACK THE SAME DAY AS LONG AS YOU CALL BEFORE 4:00 PM

## 2023-12-23 ENCOUNTER — Encounter: Payer: Self-pay | Admitting: Internal Medicine

## 2023-12-23 ENCOUNTER — Ambulatory Visit (INDEPENDENT_AMBULATORY_CARE_PROVIDER_SITE_OTHER): Admitting: Internal Medicine

## 2023-12-23 VITALS — BP 132/74 | HR 70 | Temp 98.0°F | Resp 18 | Ht 75.0 in | Wt 213.2 lb

## 2023-12-23 DIAGNOSIS — I5032 Chronic diastolic (congestive) heart failure: Secondary | ICD-10-CM | POA: Diagnosis not present

## 2023-12-23 DIAGNOSIS — Z23 Encounter for immunization: Secondary | ICD-10-CM | POA: Diagnosis not present

## 2023-12-23 DIAGNOSIS — J449 Chronic obstructive pulmonary disease, unspecified: Secondary | ICD-10-CM | POA: Diagnosis not present

## 2023-12-23 MED ORDER — METOPROLOL SUCCINATE ER 100 MG PO TB24: 100.0000 mg | ORAL_TABLET | Freq: Every day | ORAL | 1 refills | Status: DC

## 2023-12-23 MED ORDER — DAPAGLIFLOZIN PROPANEDIOL 10 MG PO TABS: 10.0000 mg | ORAL_TABLET | Freq: Every day | ORAL | 5 refills | Status: DC

## 2023-12-23 NOTE — Progress Notes (Signed)
 Established Patient Office Visit  Subjective    Patient ID: Joshua Schmidt, male    DOB: 09-19-1950  Age: 73 y.o. MRN: [id number]  CC:  Chief Complaint  Patient presents with   Follow-up    2 week recheck    HPI Joshua Schmidt presents for follow up on chronic medical conditions.   Discussed the use of AI scribe software for clinical note transcription with the patient, who gave verbal consent to proceed.  History of Present Illness  Joshua Schmidt is a 73 year old male with chronic obstructive pulmonary disease (COPD) who presents for follow-up regarding his oxygen  therapy and medication management.  Oxygen  saturation levels are around 94% without supplemental oxygen . He can maintain levels at 93-94% for up to an hour and a half while working outside. He uses oxygen  during sleep. A new oxygen  machine from [employer] has been provided but not yet used. The previous machine facilitated quick sleep onset and restful sleep, with only one nocturnal awakening to urinate. He wakes consistently at 7 AM feeling well-rested.  His medication regimen includes spironolactone  12.5 mg daily, Farxiga , losartan , and inhalers. There was a recent change in his inhaler during hospitalization, and he is currently using the one provided there.  He smokes cigarettes and acknowledges the negative impact on his health, particularly concerning oxygen  use.  Hypertension: -Medications: Metoprolol  100 mg,  HCTZ 25 mg, Losartan  50 mg -Patient is compliant with above medications and reports no side effects.   -Failed Meds: Lisinopril  due to facial swelling -Checking BP at home (average): 132-140/80 -Denies any SOB, CP, vision changes or symptoms of hypotension - does have BLE edema but much improved today -Following with Cardiology  HLD/History of MI/Aortic and Iliac Aneurysms/Chronic Venous Insufficiency:   -Medications: Lipitor 20 mg, aspirin  81 mg -Patient is compliant with above medications  and reports no side effects.  -History of MI in 2022 for which he did not seek medical attention at the time, was diagnosed retroactively  -CTA 7/24 with dilated ascending thoracic aorta 4.5 cm, iliac duplex US  8/24 with 4 cm AAA, 3.4 cm right common iliac artery aneurysm without significant growth. - Per Cardiology note, patient no longer a candidate for surgical fixation now that he is oxygen  dependent so routine imaging is no longer required.  Of note, ascending thoracic aortic aneurysm minimally progressive to 4.7 cm on lung CT 11/24 -Last lipid panel: Lipid Panel     Component Value Date/Time   CHOL 97 11/29/2023 0345   TRIG 109 11/29/2023 0345   HDL 29 (L) 11/29/2023 0345   CHOLHDL 3.3 11/29/2023 0345   VLDL 22 11/29/2023 0345   LDLCALC 46 11/29/2023 0345   LDLCALC 54 10/26/2023 0941   COPD: -COPD status: better -Current medications: Breztri  and Albuterol  PRN -Oxygen  use: yes -requiring 2 L supplemental oxygen  but has been able to maintain oxygen  saturations of 90% or above at rest or with minimal activity -Dyspnea frequency: Occasional  -Cough frequency: Daily, productive  -Limitation of activity: no -Pneumovax: Up to Date -Influenza: due today -Annual lung cancer screening: 11/24 Lung-RADS-2  -Stopped smoking since hospitalization -Now following with Pulmonology  PTSD: -Not currently on medication -Had done counseling in the past but not interested it anything now  Health Maintance: -Blood work UTD -Colon cancer screening: colonoscopy 12/23, 3 polyps removed, plan to follow up in 1 year. Referral placed previously  Outpatient Encounter Medications as of 12/23/2023  Medication Sig   aspirin  81 MG chewable  tablet Chew 81 mg by mouth daily.   atorvastatin  (LIPITOR) 20 MG tablet Take 1 tablet (20 mg total) by mouth daily.   budeson-glycopyrrolate -formoterol  (BREZTRI  AEROSPHERE) 160-9-4.8 MCG/ACT AERO inhaler Inhale 2 puffs into the lungs in the morning and at bedtime.    dapagliflozin  propanediol (FARXIGA ) 10 MG TABS tablet Take 1 tablet (10 mg total) by mouth daily.   fluticasone  (FLONASE ) 50 MCG/ACT nasal spray Place 2 sprays into both nostrils daily.   hydrochlorothiazide  (HYDRODIURIL ) 25 MG tablet Take 1 tablet (25 mg total) by mouth daily.   losartan  (COZAAR ) 50 MG tablet TAKE 1 TABLET BY MOUTH DAILY   metoprolol  succinate (TOPROL -XL) 100 MG 24 hr tablet Take 1 tablet (100 mg total) by mouth daily. Take with or immediately following a meal.   Multiple Vitamin (MULTIVITAMIN WITH MINERALS) TABS tablet Take 1 tablet by mouth daily.   spironolactone  (ALDACTONE ) 25 MG tablet Take 0.5 tablets (12.5 mg total) by mouth daily.   VENTOLIN  HFA 108 (90 Base) MCG/ACT inhaler USE 2 INHALATIONS BY MOUTH EVERY 6 HOURS AS NEEDED FOR WHEEZING  OR SHORTNESS OF BREATH   No facility-administered encounter medications on file as of 12/23/2023.    Past Medical History:  Diagnosis Date   Aneurysm (HCC)    COPD (chronic obstructive pulmonary disease) (HCC)    Coronary artery disease    Erythrocytosis 12/01/2018   Hyperlipidemia    Hypertension    Myocardial infarction [facility])     Past Surgical History:  Procedure Laterality Date   APPENDECTOMY     COLONOSCOPY WITH PROPOFOL  N/A 11/20/2014   Procedure: COLONOSCOPY WITH PROPOFOL ;  Surgeon: Louanne KANDICE Muse, MD;  Location: [facility] ENDOSCOPY;  Service: Endoscopy;  Laterality: N/A;   COLONOSCOPY WITH PROPOFOL  N/A 03/19/2022   Procedure: COLONOSCOPY WITH PROPOFOL ;  Surgeon: Unk Corinn Skiff, MD;  Location: [facility] ENDOSCOPY;  Service: Gastroenterology;  Laterality: N/A;   TONSILLECTOMY AND ADENOIDECTOMY      No family history on file.  Social History   Socioeconomic History   Marital status: Married    Spouse name: Not on file   Number of children: Not on file   Years of education: Not on file   Highest education level: Not on file  Occupational History   Not on file  Tobacco Use   Smoking status: Former    Current  packs/day: 0.00    Average packs/day: 1 pack/day for 51.0 years (51.0 ttl pk-yrs)    Types: Cigarettes    Start date: 10/01/1971    Quit date: 10/01/2022    Years since quitting: 1.2   Smokeless tobacco: Never  Vaping Use   Vaping status: Never Used  Substance and Sexual Activity   Alcohol use: Yes    Comment: evenings   Drug use: No   Sexual activity: Not Currently  Other Topics Concern   Not on file  Social History Narrative   Not on file   Social Drivers of Health   Financial Resource Strain: Low Risk  (10/21/2023)   Overall Financial Resource Strain (CARDIA)    Difficulty of Paying Living Expenses: Not hard at all  Food Insecurity: No Food Insecurity (10/21/2023)   Hunger Vital Sign    Worried About Running Out of Food in the Last Year: Never true    Ran Out of Food in the Last Year: Never true  Transportation Needs: No Transportation Needs (10/21/2023)   PRAPARE - Administrator, Civil Service (Medical): No    Lack of Transportation (Non-Medical): No  Physical Activity: Sufficiently Active (10/21/2023)   Exercise Vital Sign    Days of Exercise per Week: 3 days    Minutes of Exercise per Session: 60 min  Stress: No Stress Concern Present (10/21/2023)   [employer] of Occupational Health - Occupational Stress Questionnaire    Feeling of Stress: Only a little  Social Connections: Moderately Integrated (10/21/2023)   Social Connection and Isolation Panel    Frequency of Communication with Friends and Family: More than three times a week    Frequency of Social Gatherings with Friends and Family: Twice a week    Attends Religious Services: More than 4 times per year    Active Member of [employer] or Organizations: No    Attends Banker Meetings: Never    Marital Status: Married  Catering manager Violence: Not At Risk (10/21/2023)   Humiliation, Afraid, Rape, and Kick questionnaire    Fear of Current or Ex-Partner: No    Emotionally Abused: No     Physically Abused: No    Sexually Abused: No    Review of Systems  Respiratory:  Negative for cough, shortness of breath and wheezing.   Cardiovascular:  Negative for leg swelling.  All other systems reviewed and are negative.       Objective    BP 132/74 (Cuff Size: Large)   Pulse 70   Temp 98 F (36.7 C) (Oral)   Resp 18   Ht 6' 3 (1.905 m)   Wt 213 lb 3.2 oz (96.7 kg)   SpO2 99%   BMI 26.65 kg/m   Physical Exam Constitutional:      Appearance: Normal appearance.  HENT:     Head: Normocephalic and atraumatic.  Eyes:     Conjunctiva/sclera: Conjunctivae normal.  Cardiovascular:     Rate and Rhythm: Normal rate and regular rhythm.  Pulmonary:     Effort: Pulmonary effort is normal.     Breath sounds: Normal breath sounds. No wheezing, rhonchi or rales.     Comments: On 2 L [place] Musculoskeletal:     Right lower leg: No edema.     Left lower leg: No edema.  Skin:    General: Skin is warm and dry.  Neurological:     General: No focal deficit present.     Mental Status: He is alert. Mental status is at baseline.  Psychiatric:        Mood and Affect: Mood normal.        Behavior: Behavior normal.     Last CBC Lab Results  Component Value Date   WBC 10.8 12/07/2023   HGB 17.2 (H) 12/07/2023   HCT 54.1 (H) 12/07/2023   MCV 96.4 12/07/2023   MCH 30.7 12/07/2023   RDW 13.1 12/07/2023   PLT 242 12/07/2023   Last metabolic panel Lab Results  Component Value Date   GLUCOSE 102 (H) 12/07/2023   NA 143 12/07/2023   K 4.3 12/07/2023   CL 101 12/07/2023   CO2 35 (H) 12/07/2023   BUN 20 12/07/2023   CREATININE 0.96 12/07/2023   GFRNONAA >60 12/04/2023   CALCIUM  9.0 12/07/2023   PHOS 3.3 12/04/2023   PROT 6.4 12/07/2023   ALBUMIN 2.8 (L) 12/04/2023   BILITOT 0.9 12/07/2023   ALKPHOS 56 11/29/2023   AST 21 12/07/2023   ALT 43 12/07/2023   ANIONGAP 10 12/04/2023   Last lipids Lab Results  Component Value Date   CHOL 97 11/29/2023   HDL 29 (L)  11/29/2023  LDLCALC 46 11/29/2023   TRIG 109 11/29/2023   CHOLHDL 3.3 11/29/2023   Last hemoglobin A1c Lab Results  Component Value Date   HGBA1C 5.4 09/28/2022   Last thyroid  functions Lab Results  Component Value Date   TSH 0.292 (L) 11/29/2023   T3TOTAL 56 (L) 11/29/2023   Last vitamin D No results found for: 25OHVITD2, 25OHVITD3, VD25OH Last vitamin B12 and Folate Lab Results  Component Value Date   VITAMINB12 839 11/20/2022   FOLATE >24.0 11/20/2022        Assessment & Plan:   Assessment & Plan Chronic diastolic heart failure Condition well-managed with stable blood pressure and oxygen  saturation. Nighttime oxygen  use continues. - Continue spironolactone , Farxiga , and losartan . - Refill Farxiga  with 30-day supply. - Ensure inhalers are refilled.  Tobacco use/COPD Continues smoking despite understanding health risks and safety concerns with oxygen  use. - Advise smoking cessation and discuss benefits. - Ensure no smoking with supplemental oxygen . - Continue maintenance inhaler.   - Flu vaccine HIGH DOSE PF(Fluzone Trivalent) - dapagliflozin  propanediol (FARXIGA ) 10 MG TABS tablet; Take 1 tablet (10 mg total) by mouth daily.  Dispense: 30 tablet; Refill: 5 - metoprolol  succinate (TOPROL -XL) 100 MG 24 hr tablet; Take 1 tablet (100 mg total) by mouth daily. Take with or immediately following a meal.  Dispense: 90 tablet; Refill: 1   Return for already scheduled.   Sharyle Fischer, DO

## 2023-12-24 DIAGNOSIS — J441 Chronic obstructive pulmonary disease with (acute) exacerbation: Secondary | ICD-10-CM | POA: Diagnosis not present

## 2023-12-24 DIAGNOSIS — J9601 Acute respiratory failure with hypoxia: Secondary | ICD-10-CM | POA: Diagnosis not present

## 2024-01-04 DIAGNOSIS — J45909 Unspecified asthma, uncomplicated: Secondary | ICD-10-CM | POA: Diagnosis not present

## 2024-01-07 ENCOUNTER — Telehealth: Payer: Self-pay | Admitting: Family

## 2024-01-07 NOTE — Telephone Encounter (Signed)
 Called to confirm/remind patient of their appointment at the [facility] on 01/10/24.   Appointment:   [] Confirmed  [x] Left mess   [] No answer/No voice mail  [] VM Full/unable to leave message  [] Phone not in service  Patient reminded to bring all medications and/or complete list.  Confirmed patient has transportation. Gave directions, instructed to utilize valet parking.

## 2024-01-09 NOTE — Progress Notes (Unsigned)
 [facility] Note   Referring Physician: 08/25 admission PCP: Bernardo Fend, DO Cardiologist: Deatrice Cage, MD   Chief Complaint: HF visit   HPI:  Mr Farrington is a 73 y/o male with a history of COPD on 4 L at baseline, CAD status post MI, hypertension, hyperlipidemia, aortic and iliac aneurysms, chronic venous insufficiency, iron deficiency anemia, obstructive sleep apnea, HFpEF, PTSD, tobacco/ alcohol use, DVT without anticoagulation although vascular surgery note from 12/2020 shows that DVT was never confirmed.    Echo June 2024: EF of 60 to 65% mild LVH   Admitted 11/28/23 with hypoxia and AMS. Upon arrival patient hypoxic in triage at 74% on 2 L [place], this was increased to 4 L then 6 L but still at 83%. After adjustment to NRB 15 L the patient recovered to 91%. Treated for COPD/ HF exacerbation. Due to continued hypoxia, placed on bipap and then subsequently intubated. CT angio done due to history of DVT and was negative for PE but concern for possible aspiration pneumonitis. Elevated BNP/ troponin. CT head w/o contrast with no acute abnormality. No evidence of swelling/edema on imaging. Elevated troponin thought to be due to COPD exacerbation. Echo 11/29/23: EF 55-60%, moderate LVH, G1DD, normal RV, moderate dilatation of the aortic root and of the ascending aorta, measuring 45 mm.   He presents today with a chief complaint of a HF visit. Denies shortness of breath, fatigue, chest pain, edema, palpitations or dizziness. Wearing bipap/ oxygen  at bedtime since recent discharge. Is wearing his oxygen  around the clock although occasionally takes it off. Denies any SOB when he removes it. Has been working on re-doing his deck at home and says that physically he feels fine when he's working on it.   Stopped smoking at recent admission. Drinking 1-2 cans (12 oz) beer nightly. On weekends up to 5-6 beers / night. Denies any drug use.    Adding very little salt. Rarely eats out  and is weighing himself daily. Drinks 1-2 pots of caffeine coffee daily  Review of Systems: [y] = yes, [ ]  = no   General: Weight gain [ ] ; Weight loss [ ] ; Anorexia [ ] ; Fatigue [ ] ; Fever [ ] ; Chills [ ] ; Weakness [ ]   Cardiac: Chest pain/pressure [ ] ; Resting SOB [ ] ; Exertional SOB [ ] ; Orthopnea [ ] ; Pedal Edema [ ] ; Palpitations [ ] ; Syncope [ ] ; Presyncope [ ] ; Paroxysmal nocturnal dyspnea[ ]   Pulmonary: Cough [ ] ; Wheezing[ ] ; Hemoptysis[ ] ; Sputum [ ] ; Snoring [ ]   GI: Vomiting[ ] ; Dysphagia[ ] ; Melena[ ] ; Hematochezia [ ] ; Heartburn[ ] ; Abdominal pain [ ] ; Constipation [ ] ; Diarrhea [ ] ; BRBPR [ ]   GU: Hematuria[ ] ; Dysuria [ ] ; Nocturia[ ]   Vascular: Pain in legs with walking [ ] ; Pain in feet with lying flat [ ] ; Non-healing sores [ ] ; Stroke [ ] ; TIA [ ] ; Slurred speech [ ] ;  Neuro: Headaches[ ] ; Vertigo[ ] ; Seizures[ ] ; Paresthesias[ ] ;Blurred vision [ ] ; Diplopia [ ] ; Vision changes [ ]   Ortho/Skin: Arthritis [ ] ; Joint pain [ ] ; Muscle pain [ ] ; Joint swelling [ ] ; Back Pain [ ] ; Rash [ ]   Psych: Depression[ ] ; Anxiety[ ]   Heme: Bleeding problems [ ] ; Clotting disorders [ ] ; Anemia Davis.Dad ]  Endocrine: Diabetes [ ] ; Thyroid  dysfunction[ ]    Past Medical History:  Diagnosis Date   Aneurysm    COPD (chronic obstructive pulmonary disease) (HCC)    Coronary artery disease    Erythrocytosis  12/01/2018   Hyperlipidemia    Hypertension    Myocardial infarction [facility])     Current Outpatient Medications  Medication Sig Dispense Refill   aspirin  81 MG chewable tablet Chew 81 mg by mouth daily.     atorvastatin  (LIPITOR) 20 MG tablet Take 1 tablet (20 mg total) by mouth daily. 100 tablet 2   budeson-glycopyrrolate -formoterol  (BREZTRI  AEROSPHERE) 160-9-4.8 MCG/ACT AERO inhaler Inhale 2 puffs into the lungs in the morning and at bedtime. 10.7 g 11   dapagliflozin  propanediol (FARXIGA ) 10 MG TABS tablet Take 1 tablet (10 mg total) by mouth daily. 30 tablet 5   fluticasone  (FLONASE ) 50  MCG/ACT nasal spray Place 2 sprays into both nostrils daily. 16 g 6   hydrochlorothiazide  (HYDRODIURIL ) 25 MG tablet Take 1 tablet (25 mg total) by mouth daily. 90 tablet 1   losartan  (COZAAR ) 50 MG tablet TAKE 1 TABLET BY MOUTH DAILY 100 tablet 2   metoprolol  succinate (TOPROL -XL) 100 MG 24 hr tablet Take 1 tablet (100 mg total) by mouth daily. Take with or immediately following a meal. 90 tablet 1   Multiple Vitamin (MULTIVITAMIN WITH MINERALS) TABS tablet Take 1 tablet by mouth daily. 90 tablet 0   spironolactone  (ALDACTONE ) 25 MG tablet Take 0.5 tablets (12.5 mg total) by mouth daily. 45 tablet 3   VENTOLIN  HFA 108 (90 Base) MCG/ACT inhaler USE 2 INHALATIONS BY MOUTH EVERY 6 HOURS AS NEEDED FOR WHEEZING  OR SHORTNESS OF BREATH 54 g 3   No current facility-administered medications for this visit.    No Known Allergies    Social History   Socioeconomic History   Marital status: Married    Spouse name: Not on file   Number of children: Not on file   Years of education: Not on file   Highest education level: Not on file  Occupational History   Not on file  Tobacco Use   Smoking status: Former    Current packs/day: 0.00    Average packs/day: 1 pack/day for 51.0 years (51.0 ttl pk-yrs)    Types: Cigarettes    Start date: 10/01/1971    Quit date: 10/01/2022    Years since quitting: 1.2   Smokeless tobacco: Never  Vaping Use   Vaping status: Never Used  Substance and Sexual Activity   Alcohol use: Yes    Comment: evenings   Drug use: No   Sexual activity: Not Currently  Other Topics Concern   Not on file  Social History Narrative   Not on file   Social Drivers of Health   Financial Resource Strain: Low Risk  (10/21/2023)   Overall Financial Resource Strain (CARDIA)    Difficulty of Paying Living Expenses: Not hard at all  Food Insecurity: No Food Insecurity (10/21/2023)   Hunger Vital Sign    Worried About Running Out of Food in the Last Year: Never true    Ran Out of  Food in the Last Year: Never true  Transportation Needs: No Transportation Needs (10/21/2023)   PRAPARE - Administrator, Civil Service (Medical): No    Lack of Transportation (Non-Medical): No  Physical Activity: Sufficiently Active (10/21/2023)   Exercise Vital Sign    Days of Exercise per Week: 3 days    Minutes of Exercise per Session: 60 min  Stress: No Stress Concern Present (10/21/2023)   [employer] of Occupational Health - Occupational Stress Questionnaire    Feeling of Stress: Only a little  Social Connections: Moderately Integrated (10/21/2023)  Social Advertising account executive    Frequency of Communication with Friends and Family: More than three times a week    Frequency of Social Gatherings with Friends and Family: Twice a week    Attends Religious Services: More than 4 times per year    Active Member of [employer] or Organizations: No    Attends Banker Meetings: Never    Marital Status: Married  Catering manager Violence: Not At Risk (10/21/2023)   Humiliation, Afraid, Rape, and Kick questionnaire    Fear of Current or Ex-Partner: No    Emotionally Abused: No    Physically Abused: No    Sexually Abused: No     No family history on file.  There were no vitals filed for this visit.  Wt Readings from Last 3 Encounters:  12/23/23 213 lb 3.2 oz (96.7 kg)  12/10/23 215 lb 6.4 oz (97.7 kg)  12/07/23 217 lb (98.4 kg)   Lab Results  Component Value Date   CREATININE 0.96 12/07/2023   CREATININE 0.73 12/04/2023   CREATININE 0.68 12/03/2023    PHYSICAL EXAM:  General: Well appearing.  Cor: No JVD. Regular rhythm, rate.  Lungs: clear Abdomen: soft, nontender, nondistended. Extremities: 2+ soft pitting edema bilateral lower legs with L>R Neuro:. Affect pleasant   ECG: not done   ASSESSMENT & PLAN:  1: HFpEF- - suspect due to previous MI, severe COPD, alcohol use - NYHA class I - euvolemic - weighing daily. Reviewed parameters  to call about - Echo 06/ 2024: EF of 60 to 65% mild LVH  - Echo 11/29/23: EF 55-60%, moderate LVH, G1DD, normal RV, moderate dilatation of the aortic root and of the ascending aorta, measuring 45 mm.  - continue farxiga  10mg  daily - continue hydrochlorothiazide  25mg  daily - continue losartan  50mg  daily.  - continue metoprolol  succinate 100mg  daily - begin spironolactone  12.5mg  daily. BMET in 1 week and then in 1 month at next OV - Wear compression socks daily with removal at bedtime - discussed decreasing coffee intake (currently drinking 1-2 pots) and substituting water  for the coffee. Also need to decrease his daily intake to 60-64 ounces - doesn't eat out much  - BNP 11/28/23 was 752.8  2: HTN- - BP 110/69 - saw PCP Neill) 08/25 - BMET 12/07/23 reviewed: sodium 143, potassium 4.3, creatinine 0.96 & GFR 83 - BMET in 1 week  3: Aortic and iliac aneurysms- - saw cardiology Marsa) 11/24 - saw vascular 05/25  4: HLD- - LDL 11/29/23 was 46 - continue atorvastatin  20mg  daily  5: COPD stage 3- - saw pulmonology Herlene) 03/25 - currently on prednisone  taper  6: Tobacco use- - hasn't smoked since recent admission - drinks 1-2 cans (12oz) of beer nightly. Weekends it can be up to 5-6 beers. Reviewed the importance of decreasing alcohol intake.    Return in 1 month, sooner if needed.   Ellouise DELENA Class, FNP 01/09/24

## 2024-01-10 ENCOUNTER — Encounter: Payer: Self-pay | Admitting: Family

## 2024-01-10 ENCOUNTER — Ambulatory Visit: Attending: Family | Admitting: Family

## 2024-01-10 VITALS — BP 148/75 | HR 80 | Wt 216.0 lb

## 2024-01-10 DIAGNOSIS — J449 Chronic obstructive pulmonary disease, unspecified: Secondary | ICD-10-CM | POA: Insufficient documentation

## 2024-01-10 DIAGNOSIS — I7121 Aneurysm of the ascending aorta, without rupture: Secondary | ICD-10-CM

## 2024-01-10 DIAGNOSIS — I5032 Chronic diastolic (congestive) heart failure: Secondary | ICD-10-CM | POA: Diagnosis not present

## 2024-01-10 DIAGNOSIS — Z720 Tobacco use: Secondary | ICD-10-CM

## 2024-01-10 DIAGNOSIS — Z87891 Personal history of nicotine dependence: Secondary | ICD-10-CM | POA: Diagnosis not present

## 2024-01-10 DIAGNOSIS — E782 Mixed hyperlipidemia: Secondary | ICD-10-CM | POA: Diagnosis not present

## 2024-01-10 DIAGNOSIS — I110 Hypertensive heart disease with heart failure: Secondary | ICD-10-CM | POA: Insufficient documentation

## 2024-01-10 DIAGNOSIS — G4733 Obstructive sleep apnea (adult) (pediatric): Secondary | ICD-10-CM | POA: Diagnosis not present

## 2024-01-10 DIAGNOSIS — I723 Aneurysm of iliac artery: Secondary | ICD-10-CM | POA: Diagnosis not present

## 2024-01-10 DIAGNOSIS — I2510 Atherosclerotic heart disease of native coronary artery without angina pectoris: Secondary | ICD-10-CM | POA: Insufficient documentation

## 2024-01-10 DIAGNOSIS — F1090 Alcohol use, unspecified, uncomplicated: Secondary | ICD-10-CM | POA: Insufficient documentation

## 2024-01-10 DIAGNOSIS — Z9981 Dependence on supplemental oxygen: Secondary | ICD-10-CM | POA: Insufficient documentation

## 2024-01-10 DIAGNOSIS — I252 Old myocardial infarction: Secondary | ICD-10-CM | POA: Insufficient documentation

## 2024-01-10 DIAGNOSIS — E785 Hyperlipidemia, unspecified: Secondary | ICD-10-CM | POA: Insufficient documentation

## 2024-01-10 DIAGNOSIS — Z79899 Other long term (current) drug therapy: Secondary | ICD-10-CM | POA: Insufficient documentation

## 2024-01-10 DIAGNOSIS — I10 Essential (primary) hypertension: Secondary | ICD-10-CM | POA: Diagnosis not present

## 2024-01-10 MED ORDER — SPIRONOLACTONE 25 MG PO TABS: 25.0000 mg | ORAL_TABLET | Freq: Every day | ORAL | 5 refills | Status: DC

## 2024-01-10 NOTE — Patient Instructions (Addendum)
 Medication Changes:  Increase Spironolactone  to 25 MG once daily   Lab Work:  Go downstairs to [place] on LOWER LEVEL to have your blood work completed.  We will only call you if the results are abnormal or if the provider would like to make medication changes.  No news is good news.   Follow-Up in: 6 weeks with Ellouise Class, FNP.   Thank you for choosing [place] [facility].    At the Advanced [facility] Clinic, you and your health needs are our priority. We have a designated team specialized in the treatment of Heart Failure. This Care Team includes your primary Heart Failure Specialized Cardiologist (physician), Advanced Practice Providers (APPs- Physician Assistants and Nurse Practitioners), and Pharmacist who all work together to provide you with the care you need, when you need it.   You may see any of the following providers on your designated Care Team at your next follow up:  Dr. Toribio Fuel Dr. Ezra Shuck Dr. Ria Commander Dr. Morene Brownie Ellouise Class, FNP Jaun Bash, RPH-CPP  Please be sure to bring in all your medications bottles to every appointment.   Need to Contact Us :  If you have any questions or concerns before your next appointment please send us  a message through [place] or call our office at [phone].    TO LEAVE A MESSAGE FOR THE NURSE SELECT OPTION 2, PLEASE LEAVE A MESSAGE INCLUDING: YOUR NAME DATE OF BIRTH CALL BACK NUMBER REASON FOR CALL**this is important as we prioritize the call backs  YOU WILL RECEIVE A CALL BACK THE SAME DAY AS LONG AS YOU CALL BEFORE 4:00 PM

## 2024-01-11 ENCOUNTER — Ambulatory Visit: Payer: Self-pay | Admitting: Family

## 2024-01-11 LAB — BASIC METABOLIC PANEL WITH GFR
BUN/Creatinine Ratio: 17 (ref 10–24)
BUN: 15 mg/dL (ref 8–27)
CO2: 26 mmol/L (ref 20–29)
Calcium: 8.7 mg/dL (ref 8.6–10.2)
Chloride: 101 mmol/L (ref 96–106)
Creatinine, Ser: 0.88 mg/dL (ref 0.76–1.27)
Glucose: 90 mg/dL (ref 70–99)
Potassium: 4 mmol/L (ref 3.5–5.2)
Sodium: 140 mmol/L (ref 134–144)
eGFR: 90 mL/min/1.73 (ref >59)

## 2024-01-13 ENCOUNTER — Ambulatory Visit: Admitting: Vascular Surgery

## 2024-01-13 ENCOUNTER — Ambulatory Visit (HOSPITAL_COMMUNITY)
Admission: RE | Admit: 2024-01-13 | Discharge: 2024-01-13 | Disposition: A | Discharge status: Home / Self Care | Source: Ambulatory Visit | Attending: Vascular Surgery | Admitting: Vascular Surgery

## 2024-01-13 ENCOUNTER — Encounter: Payer: Self-pay | Admitting: Vascular Surgery

## 2024-01-13 VITALS — BP 149/88 | HR 68 | Temp 98.3°F | Resp 22 | Ht 75.0 in | Wt 217.5 lb

## 2024-01-13 DIAGNOSIS — I723 Aneurysm of iliac artery: Secondary | ICD-10-CM | POA: Diagnosis not present

## 2024-01-13 DIAGNOSIS — I7143 Infrarenal abdominal aortic aneurysm, without rupture: Secondary | ICD-10-CM | POA: Insufficient documentation

## 2024-01-13 NOTE — Progress Notes (Signed)
 Office Note    HPI: Joshua Schmidt is a 72 y.o. (1950-04-28) male presenting in follow-up with known aortoiliac aneurysmal disease.  On exam today, [name] was doing well.  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  Since last seen, he had an episode of acute respiratory failure resulting in intubation.  He was subsequently extubated, and is now back on his usual, 2 L nasal cannula.  He is no longer smoking.  Regarding his aortoiliac aneurysmal disease, he denies back pain, abdominal pain, chest pain, pelvic pain. He continues to be active in his garden.  No family history of aneurysmal disease.  The pt is  on a statin for cholesterol management.  The pt is  on a daily aspirin .   Other AC:  - The pt is  on medication for hypertension.   The pt is not diabetic.  Tobacco hx:  current- cutting back   Past Medical History:  Diagnosis Date   AAA (abdominal aortic aneurysm)    Aneurysm    COPD (chronic obstructive pulmonary disease) (HCC)    Coronary artery disease    Erythrocytosis 12/01/2018   Hyperlipidemia    Hypertension    Myocardial infarction [facility])     Past Surgical History:  Procedure Laterality Date   APPENDECTOMY     COLONOSCOPY WITH PROPOFOL  N/A 11/20/2014   Procedure: COLONOSCOPY WITH PROPOFOL ;  Surgeon: Louanne KANDICE Muse, MD;  Location: [facility] ENDOSCOPY;  Service: Endoscopy;  Laterality: N/A;   COLONOSCOPY WITH PROPOFOL  N/A 03/19/2022   Procedure: COLONOSCOPY WITH PROPOFOL ;  Surgeon: Unk Corinn Skiff, MD;  Location: [facility] ENDOSCOPY;  Service: Gastroenterology;  Laterality: N/A;   TONSILLECTOMY AND ADENOIDECTOMY      Social History   Socioeconomic History   Marital status: Married    Spouse name: Not on file   Number of children: Not on file   Years of education: Not on file   Highest education level: Not on file  Occupational History   Not on file  Tobacco Use   Smoking status: Former    Current packs/day: 0.00    Average packs/day: 1  pack/day for 51.0 years (51.0 ttl pk-yrs)    Types: Cigarettes    Start date: 10/01/1971    Quit date: 10/01/2022    Years since quitting: 1.2   Smokeless tobacco: Never  Vaping Use   Vaping status: Never Used  Substance and Sexual Activity   Alcohol use: Yes    Comment: evenings   Drug use: No   Sexual activity: Not Currently  Other Topics Concern   Not on file  Social History Narrative   Not on file   Social Drivers of Health   Financial Resource Strain: Low Risk  (10/21/2023)   Overall Financial Resource Strain (CARDIA)    Difficulty of Paying Living Expenses: Not hard at all  Food Insecurity: No Food Insecurity (10/21/2023)   Hunger Vital Sign    Worried About Running Out of Food in the Last Year: Never true    Ran Out of Food in the Last Year: Never true  Transportation Needs: No Transportation Needs (10/21/2023)   PRAPARE - Administrator, Civil Service (Medical): No    Lack of Transportation (Non-Medical): No  Physical Activity: Sufficiently Active (10/21/2023)   Exercise Vital Sign    Days of Exercise per Week: 3 days    Minutes of Exercise per Session: 60 min  Stress: No Stress Concern Present (10/21/2023)   [employer] of  Occupational Health - Occupational Stress Questionnaire    Feeling of Stress: Only a little  Social Connections: Moderately Integrated (10/21/2023)   Social Connection and Isolation Panel    Frequency of Communication with Friends and Family: More than three times a week    Frequency of Social Gatherings with Friends and Family: Twice a week    Attends Religious Services: More than 4 times per year    Active Member of [employer] or Organizations: No    Attends Banker Meetings: Never    Marital Status: Married  Catering manager Violence: Not At Risk (10/21/2023)   Humiliation, Afraid, Rape, and Kick questionnaire    Fear of Current or Ex-Partner: No    Emotionally Abused: No    Physically Abused: No    Sexually Abused: No    History reviewed. No pertinent family history.  Current Outpatient Medications  Medication Sig Dispense Refill   aspirin  81 MG chewable tablet Chew 81 mg by mouth daily.     atorvastatin  (LIPITOR) 20 MG tablet Take 1 tablet (20 mg total) by mouth daily. 100 tablet 2   budeson-glycopyrrolate -formoterol  (BREZTRI  AEROSPHERE) 160-9-4.8 MCG/ACT AERO inhaler Inhale 2 puffs into the lungs in the morning and at bedtime. 10.7 g 11   dapagliflozin  propanediol (FARXIGA ) 10 MG TABS tablet Take 1 tablet (10 mg total) by mouth daily. 30 tablet 5   fluticasone  (FLONASE ) 50 MCG/ACT nasal spray Place 2 sprays into both nostrils daily. 16 g 6   hydrochlorothiazide  (HYDRODIURIL ) 25 MG tablet Take 1 tablet (25 mg total) by mouth daily. 90 tablet 1   losartan  (COZAAR ) 50 MG tablet TAKE 1 TABLET BY MOUTH DAILY 100 tablet 2   metoprolol  succinate (TOPROL -XL) 100 MG 24 hr tablet Take 1 tablet (100 mg total) by mouth daily. Take with or immediately following a meal. 90 tablet 1   Multiple Vitamin (MULTIVITAMIN WITH MINERALS) TABS tablet Take 1 tablet by mouth daily. 90 tablet 0   spironolactone  (ALDACTONE ) 25 MG tablet Take 1 tablet (25 mg total) by mouth daily. 30 tablet 5   VENTOLIN  HFA 108 (90 Base) MCG/ACT inhaler USE 2 INHALATIONS BY MOUTH EVERY 6 HOURS AS NEEDED FOR WHEEZING  OR SHORTNESS OF BREATH 54 g 3   No current facility-administered medications for this visit.    No Known Allergies   REVIEW OF SYSTEMS:   [X]  denotes positive finding, [ ]  denotes negative finding Cardiac  Comments:  Chest pain or chest pressure:    Shortness of breath upon exertion:    Short of breath when lying flat:    Irregular heart rhythm:        Vascular    Pain in calf, thigh, or hip brought on by ambulation:    Pain in feet at night that wakes you up from your sleep:     Blood clot in your veins:    Leg swelling:  X ankles      Pulmonary    Oxygen  at home:    Productive cough:     Wheezing:         Neurologic     Sudden weakness in arms or legs:     Sudden numbness in arms or legs:     Sudden onset of difficulty speaking or slurred speech:    Temporary loss of vision in one eye:     Problems with dizziness:         Gastrointestinal    Blood in stool:     Vomited blood:  Genitourinary    Burning when urinating:     Blood in urine:        Psychiatric    Major depression:         Hematologic    Bleeding problems:    Problems with blood clotting too easily:        Skin    Rashes or ulcers:        Constitutional    Fever or chills:      PHYSICAL EXAMINATION:  Vitals:   01/13/24 0916  BP: (!) 149/88  Pulse: 68  Resp: (!) 22  Temp: 98.3 F (36.8 C)  TempSrc: Temporal  SpO2: 92%  Weight: 217 lb 8 oz (98.7 kg)  Height: 6' 3 (1.905 m)    General:  WDWN in NAD; vital signs documented above Gait: Not observed HENT: WNL, normocephalic Pulmonary: normal non-labored breathing , without wheezing Cardiac: regular HR Abdomen: soft, NT, no masses Skin: without rashes Vascular Exam/Pulses:  Right Left  Radial 2+ (normal) 2+ (normal)  Ulnar    Femoral 2+ (normal) 2+ (normal)  Popliteal    DP 2+ (normal) 2+ (normal)  PT     Extremities: without ischemic changes, without Gangrene , without cellulitis; without open wounds;  Musculoskeletal: no muscle wasting or atrophy  Neurologic: A&O X 3;  No focal weakness or paresthesias are detected Psychiatric:  The pt has Normal affect.   Non-Invasive Vascular Imaging:    Abdominal Aorta Findings:  +-----------+-------+----------+----------+--------+--------+--------------  ---+  Location  AP (cm)Trans (cm)PSV (cm/s)WaveformThrombusComments            +-----------+-------+----------+----------+--------+--------+--------------  ---+  Proximal  2.76   2.82      153                                           +-----------+-------+----------+----------+--------+--------+--------------  ---+  Mid       3.92    3.89      69                                            +-----------+-------+----------+----------+--------+--------+--------------  ---+  Distal    4.08   3.95                                slight  tortuosity  +-----------+-------+----------+----------+--------+--------+--------------  ---+  RT CIA Prox3.6    3.5       37                                            +-----------+-------+----------+----------+--------+--------+--------------  ---+  LT CIA Prox2.1    2.2       69                                            +-----------+-------+----------+----------+--------+--------+--------------    ASSESSMENT/PLAN: Debby FORBES Pitter is a 73 y.o. male presenting with aortoiliac aneurysmal disease.  Ultrasound duplex today demonstrated a small growth in the infrarenal abdominal aortic aneurysm as well as  right common iliac artery aneurysm.  The right common iliac artery aneurysm is now measuring over 3.5 cm, which per vascular surgery guidelines is an indication for elective repair.  My plan is to obtain a CT angio abdomen pelvis in an effort to further define the aneurysm.  If he is over 3.5, we discussed surgery, but he is aware that he is at increased risk from the general population due to his need for supplemental oxygen  at baseline.  [name] was honest, that he is still paying off the bills from his last hospitalization.  He stated he will move forward with the CTA when able, which will likely be in a few months time.  He is aware that he is at a slightly elevated risk of rupture, specifically at the right common iliac artery, however I think that this is still low at 3.5 cm.  I plan to see him back in the office as soon as he is able in an effort to discuss his CT, and possible endovascular aortic repair.   We discussed the signs and symptoms of rupture, and I asked him to call 911 immediately should any of these occur.  I asked that he continue his aspirin  and  statin therapy.    Fonda FORBES Rim, MD [facility] [phone]

## 2024-01-14 ENCOUNTER — Other Ambulatory Visit: Payer: Self-pay

## 2024-01-14 ENCOUNTER — Encounter: Payer: Self-pay | Admitting: Oncology

## 2024-01-14 DIAGNOSIS — I723 Aneurysm of iliac artery: Secondary | ICD-10-CM

## 2024-01-21 ENCOUNTER — Other Ambulatory Visit: Payer: Self-pay | Admitting: Internal Medicine

## 2024-01-21 DIAGNOSIS — I5032 Chronic diastolic (congestive) heart failure: Secondary | ICD-10-CM

## 2024-01-21 NOTE — Telephone Encounter (Signed)
 Copied from CRM [id number]. Topic: Clinical - Medication Refill >> Jan 21, 2024 11:29 AM Lonell PEDLAR wrote: Medication: metoprolol  succinate (TOPROL -XL) 100 MG 24 hr tablet  Has the patient contacted their pharmacy? Yes, stated cannot refill until December   This is the patient's preferred pharmacy:  [id number] [address], [facility] - [address] [place] [address] Phone: [phone] Fax: [phone]  Is this the correct pharmacy for this prescription? Yes If no, delete pharmacy and type the correct one.   Has the prescription been filled recently? Yes  Is the patient out of the medication? Yes  Has the patient been seen for an appointment in the last year OR does the patient have an upcoming appointment? Yes  Can we respond through MyChart? No  Agent: Please be advised that Rx refills may take up to 3 business days. We ask that you follow-up with your pharmacy.

## 2024-01-22 DIAGNOSIS — J449 Chronic obstructive pulmonary disease, unspecified: Secondary | ICD-10-CM | POA: Diagnosis not present

## 2024-01-23 DIAGNOSIS — J9601 Acute respiratory failure with hypoxia: Secondary | ICD-10-CM | POA: Diagnosis not present

## 2024-01-23 DIAGNOSIS — I5033 Acute on chronic diastolic (congestive) heart failure: Secondary | ICD-10-CM | POA: Diagnosis not present

## 2024-01-23 DIAGNOSIS — J441 Chronic obstructive pulmonary disease with (acute) exacerbation: Secondary | ICD-10-CM | POA: Diagnosis not present

## 2024-01-25 ENCOUNTER — Ambulatory Visit
Admission: RE | Admit: 2024-01-25 | Discharge: 2024-01-25 | Disposition: A | Discharge status: Home / Self Care | Source: Ambulatory Visit | Attending: Vascular Surgery | Admitting: Vascular Surgery

## 2024-01-25 DIAGNOSIS — I701 Atherosclerosis of renal artery: Secondary | ICD-10-CM | POA: Diagnosis not present

## 2024-01-25 DIAGNOSIS — N281 Cyst of kidney, acquired: Secondary | ICD-10-CM | POA: Diagnosis not present

## 2024-01-25 DIAGNOSIS — I7143 Infrarenal abdominal aortic aneurysm, without rupture: Secondary | ICD-10-CM | POA: Diagnosis not present

## 2024-01-25 DIAGNOSIS — I723 Aneurysm of iliac artery: Secondary | ICD-10-CM | POA: Insufficient documentation

## 2024-01-25 MED ORDER — IOHEXOL 350 MG/ML SOLN
100.0000 mL | Freq: Once | INTRAVENOUS | Status: AC | PRN
  Administered 2024-01-25: 100 mL via INTRAVENOUS

## 2024-01-25 NOTE — Telephone Encounter (Signed)
 Too soon for refill, LRF 12/23/23 for 90 days.  Requested Prescriptions  Pending Prescriptions Disp Refills   metoprolol  succinate (TOPROL -XL) 100 MG 24 hr tablet 90 tablet 1    Sig: Take 1 tablet (100 mg total) by mouth daily. Take with or immediately following a meal.     Cardiovascular:  Beta Blockers Failed - 01/25/2024  8:51 AM      Failed - Last BP in normal range    BP Readings from Last 1 Encounters:  01/13/24 (!) 149/88         Passed - Last Heart Rate in normal range    Pulse Readings from Last 1 Encounters:  01/13/24 68         Passed - Valid encounter within last 6 months    Recent Outpatient Visits           1 month ago Chronic heart failure with preserved ejection fraction [facility])   [place] [facility] Bernardo Fend, DO   1 month ago Hospital discharge follow-up   [facility] Bernardo Fend, DO   3 months ago Hypertension, unspecified type   [facility] Bernardo Fend, DO   6 months ago Chronic obstructive pulmonary disease, unspecified COPD type [facility])   [place] [facility] Bernardo Fend, DO       Future Appointments             In 1 week Darron, Deatrice LABOR, MD [facility]

## 2024-02-01 ENCOUNTER — Encounter: Payer: Self-pay | Admitting: Cardiovascular Disease

## 2024-02-01 ENCOUNTER — Ambulatory Visit: Attending: Cardiovascular Disease | Admitting: Cardiovascular Disease

## 2024-02-01 VITALS — BP 120/70 | HR 97 | Ht 75.0 in | Wt 214.1 lb

## 2024-02-01 DIAGNOSIS — I7143 Infrarenal abdominal aortic aneurysm, without rupture: Secondary | ICD-10-CM

## 2024-02-01 DIAGNOSIS — I10 Essential (primary) hypertension: Secondary | ICD-10-CM | POA: Diagnosis not present

## 2024-02-01 DIAGNOSIS — I5032 Chronic diastolic (congestive) heart failure: Secondary | ICD-10-CM

## 2024-02-01 DIAGNOSIS — I7121 Aneurysm of the ascending aorta, without rupture: Secondary | ICD-10-CM | POA: Diagnosis not present

## 2024-02-01 DIAGNOSIS — E785 Hyperlipidemia, unspecified: Secondary | ICD-10-CM

## 2024-02-01 NOTE — Progress Notes (Unsigned)
 Cardiology Office Note   Date:  02/01/2024   ID:  Joshua Schmidt, DOB [birth year], MRN [id number]  PCP:  Bernardo Fend, DO  Cardiologist:   Deatrice Cage, MD   Chief Complaint  Patient presents with   Follow-up    OD 6 month f/u no complaints today. Meds reviewed verbally with pt.      History of Present Illness: Joshua Schmidt is a 73 y.o. male who is here today for follow-up visit regarding aortic and iliac aneurysms and possible bicuspid aortic valve.   The patient reports possibly having myocardial infarction at home years ago but did not seek medical attention.  He has chronic medical conditions that include essential hypertension, hyperlipidemia, tobacco use and COPD.  He has no family history of aortic aneurysm or coronary artery disease.  He used to smoke 1-1/2 pack/day but quit in June of 2024 after his hospitalization for COPD. He is known to have ascending aortic aneurysm as well as coronary calcifications. He has been getting CT scan of the lungs for cancer screening and was noted to have di He also has history of chronic venous insufficiency with normal ABI.  He is status post sclerotherapy involving the right lower extremity. He is followed by vascular surgery for abdominal aortic and iliac artery aneurysms.  He underwent a Lexiscan  Myoview  in February of 2024 which showed no evidence of ischemia with normal ejection fraction.  He was seen by Dr. Lanis from vascular surgery with recommendations to follow his aneurysmal disease with plans for intervention once his iliac aneurysm exceeds 3.5 cm.  He was hospitalized in August with shortness of breath and mental status change after he removed his oxygen  while he was asleep.  He was hypoxic on presentation.  He was placed on BiPAP but vomited and aspirated leading to intubation and mechanical ventilatory support.  His BNP was elevated at 752.  CTA of the chest was negative for PE.  An echocardiogram was done and  showed normal LV systolic function with stable size ascending aorta at 45 mm.  He has been doing reasonably well and reports stable shortness of breath with no chest pain.  He is on oxygen  2 L.  He reports smoking cigarettes intermittently but not when he is wearing his oxygen .   Past Medical History:  Diagnosis Date   AAA (abdominal aortic aneurysm)    Aneurysm    COPD (chronic obstructive pulmonary disease) (HCC)    Coronary artery disease    Erythrocytosis 12/01/2018   Hyperlipidemia    Hypertension    Myocardial infarction [facility])     Past Surgical History:  Procedure Laterality Date   APPENDECTOMY     COLONOSCOPY WITH PROPOFOL  N/A 11/20/2014   Procedure: COLONOSCOPY WITH PROPOFOL ;  Surgeon: Louanne KANDICE Muse, MD;  Location: [facility] ENDOSCOPY;  Service: Endoscopy;  Laterality: N/A;   COLONOSCOPY WITH PROPOFOL  N/A 03/19/2022   Procedure: COLONOSCOPY WITH PROPOFOL ;  Surgeon: Unk Corinn Skiff, MD;  Location: [facility] ENDOSCOPY;  Service: Gastroenterology;  Laterality: N/A;   TONSILLECTOMY AND ADENOIDECTOMY       Current Outpatient Medications  Medication Sig Dispense Refill   aspirin  81 MG chewable tablet Chew 81 mg by mouth daily.     atorvastatin  (LIPITOR) 20 MG tablet Take 1 tablet (20 mg total) by mouth daily. 100 tablet 2   budeson-glycopyrrolate -formoterol  (BREZTRI  AEROSPHERE) 160-9-4.8 MCG/ACT AERO inhaler Inhale 2 puffs into the lungs in the morning and at bedtime. 10.7 g 11   dapagliflozin  propanediol (FARXIGA )  10 MG TABS tablet Take 1 tablet (10 mg total) by mouth daily. 30 tablet 5   fluticasone  (FLONASE ) 50 MCG/ACT nasal spray Place 2 sprays into both nostrils daily. 16 g 6   hydrochlorothiazide  (HYDRODIURIL ) 25 MG tablet Take 1 tablet (25 mg total) by mouth daily. 90 tablet 1   losartan  (COZAAR ) 50 MG tablet TAKE 1 TABLET BY MOUTH DAILY 100 tablet 2   metoprolol  succinate (TOPROL -XL) 100 MG 24 hr tablet Take 1 tablet (100 mg total) by mouth daily. Take with or  immediately following a meal. 90 tablet 1   Multiple Vitamin (MULTIVITAMIN WITH MINERALS) TABS tablet Take 1 tablet by mouth daily. 90 tablet 0   spironolactone  (ALDACTONE ) 25 MG tablet Take 1 tablet (25 mg total) by mouth daily. 30 tablet 5   VENTOLIN  HFA 108 (90 Base) MCG/ACT inhaler USE 2 INHALATIONS BY MOUTH EVERY 6 HOURS AS NEEDED FOR WHEEZING  OR SHORTNESS OF BREATH 54 g 3   No current facility-administered medications for this visit.    Allergies:   Patient has no known allergies.    Social History:  The patient  reports that he quit smoking about 16 months ago. His smoking use included cigarettes. He started smoking about 52 years ago. He has a 51 pack-year smoking history. He has never used smokeless tobacco. He reports current alcohol use. He reports that he does not use drugs.   Family History:  The patient's family history is not on file.    ROS:  Please see the history of present illness.   Otherwise, review of systems are positive for none.   All other systems are reviewed and negative.    PHYSICAL EXAM: VS:  BP 120/70 (BP Location: Left Arm, Patient Position: Sitting, Cuff Size: Normal)   Pulse 97   Ht 6' 3 (1.905 m)   Wt 214 lb 2 oz (97.1 kg)   SpO2 95%   BMI 26.76 kg/m  , BMI Body mass index is 26.76 kg/m. GEN: Well nourished, well developed, in no acute distress  HEENT: normal  Neck: no JVD, carotid bruits, or masses Cardiac: RRR; no  rubs, or gallops,no edema .  1/6 systolic murmur in the aortic area. Respiratory:  clear to auscultation bilaterally, normal work of breathing.  Diminished breath sounds bilaterally GI: soft, nontender, nondistended, + BS MS: no deformity or atrophy  Skin: warm and dry, no rash Neuro:  Strength and sensation are intact Psych: euthymic mood, full affect   EKG:  EKG is ordered today. The ekg ordered today demonstrates: Sinus rhythm with 1st degree A-V block with Premature atrial complexes with Abberant conduction Right  superior axis deviation Right ventricular hypertrophy Inferior infarct , age undetermined When compared with ECG of 28-Nov-2023 17:54, Significant changes have occurred   Recent Labs: 11/28/2023: B Natriuretic Peptide 752.8 11/29/2023: TSH 0.292 12/03/2023: Magnesium 2.3 12/07/2023: ALT 43; Hemoglobin 17.2; Platelets 242 01/10/2024: BUN 15; Creatinine, Ser 0.88; Potassium 4.0; Sodium 140    Lipid Panel    Component Value Date/Time   CHOL 97 11/29/2023 0345   TRIG 109 11/29/2023 0345   HDL 29 (L) 11/29/2023 0345   CHOLHDL 3.3 11/29/2023 0345   VLDL 22 11/29/2023 0345   LDLCALC 46 11/29/2023 0345   LDLCALC 54 10/26/2023 0941      Wt Readings from Last 3 Encounters:  02/01/24 214 lb 2 oz (97.1 kg)  01/13/24 217 lb 8 oz (98.7 kg)  01/10/24 216 lb (98 kg)  02/17/2022    1:54 PM  PAD Screen  Previous PAD dx? Yes  Previous surgical procedure? Yes  Pain with walking? No  Feet/toe relief with dangling? No  Painful, non-healing ulcers? No  Extremities discolored? No      ASSESSMENT AND PLAN:  1.  Ascending aortic aneurysm: This is below threshold for surgical repair.  Given his COPD and the fact that he is now oxygen  dependent, he is not going to be a surgical candidate for aortic aneurysm repair or even if this gets larger.  Thus, no need for further imaging of this.  2.  Exertional dyspnea and abnormal EKG: Echocardiogram showed normal LV systolic function and possible bicuspid aortic valve.  Lexiscan  Myoview  in February 2024 showed no evidence of ischemia.  Dyspnea is likely due to COPD.  3.  Essential hypertension: His blood pressure is well-controlled on current medications.  4.  Hyperlipidemia: Continue atorvastatin  20 mg once daily.  Most recent lipid profile showed an LDL of 41.  5.  Tobacco use: He quit but reports now intermittent tobacco use.  Discussed the importance of abstinence.  6.  Abdominal aortic and iliac artery aneurysm: This has progressed  with right common iliac artery aneurysm at 3.7 cm on recent CTA.  He has a follow-up appointment with Dr.Robins in the near future.  He is not a candidate for open repair given his advanced lung disease but likely a reasonable candidate for endovascular repair.  7.  Chronic diastolic heart failure: He appears to be euvolemic.  Continue Farxiga  and spironolactone .   Disposition:   FU with me in 6 months  Signed,  Deatrice Cage, MD  02/01/2024 2:48 PM    [facility]

## 2024-02-01 NOTE — Patient Instructions (Signed)
 Medication Instructions:  Your physician recommends that you continue on your current medications as directed. Please refer to the Current Medication list given to you today.  *If you need a refill on your cardiac medications before your next appointment, please call your pharmacy*  Lab Work: No labs ordered today  If you have labs (blood work) drawn today and your tests are completely normal, you will receive your results only by: MyChart Message (if you have MyChart) OR A paper copy in the mail If you have any lab test that is abnormal or we need to change your treatment, we will call you to review the results.  Testing/Procedures: No test ordered today   Follow-Up: At [facility], you and your health needs are our priority.  As part of our continuing mission to provide you with exceptional heart care, our providers are all part of one team.  This team includes your primary Cardiologist (physician) and Advanced Practice Providers or APPs (Physician Assistants and Nurse Practitioners) who all work together to provide you with the care you need, when you need it.  Your next appointment:  6 month(s)  Provider:  Deatrice Cage, MD  We recommend signing up for the patient portal called MyChart.  Sign up information is provided on this After Visit Summary.  MyChart is used to connect with patients for Virtual Visits (Telemedicine).  Patients are able to view lab/test results, encounter notes, upcoming appointments, etc.  Non-urgent messages can be sent to your provider as well.   To learn more about what you can do with MyChart, go to [email or web address].

## 2024-02-03 DIAGNOSIS — J45909 Unspecified asthma, uncomplicated: Secondary | ICD-10-CM | POA: Diagnosis not present

## 2024-02-10 ENCOUNTER — Encounter: Payer: Self-pay | Admitting: *Deleted

## 2024-02-10 ENCOUNTER — Encounter: Payer: Self-pay | Admitting: Vascular Surgery

## 2024-02-10 ENCOUNTER — Ambulatory Visit: Attending: Vascular Surgery | Admitting: Vascular Surgery

## 2024-02-10 VITALS — BP 144/89 | HR 83 | Temp 98.7°F | Resp 22 | Ht 75.0 in | Wt 215.2 lb

## 2024-02-10 DIAGNOSIS — I7143 Infrarenal abdominal aortic aneurysm, without rupture: Secondary | ICD-10-CM | POA: Diagnosis not present

## 2024-02-10 DIAGNOSIS — I723 Aneurysm of iliac artery: Secondary | ICD-10-CM

## 2024-02-10 NOTE — Progress Notes (Signed)
 Office Note    HPI: Joshua Schmidt is a 73 y.o. (Aug 24, 1950) male presenting in follow-up with known aortoiliac aneurysmal disease.  He presents after undergoing CT angio abdomen pelvis to define AAA, right common iliac artery aneurysm.  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   He has known COPD and is on 2 L nasal cannula.  He is no longer smoking.    Regarding his aortoiliac aneurysmal disease, he denies back pain, abdominal pain, chest pain, pelvic pain.  He continues to be active, and works in his garden.  No family history of aneurysmal disease.  The pt is  on a statin for cholesterol management.  The pt is  on a daily aspirin .   Other AC:  - The pt is  on medication for hypertension.   The pt is not diabetic.  Tobacco hx:  current- cutting back   Past Medical History:  Diagnosis Date   AAA (abdominal aortic aneurysm)    Aneurysm    COPD (chronic obstructive pulmonary disease) (HCC)    Coronary artery disease    Erythrocytosis 12/01/2018   Hyperlipidemia    Hypertension    Myocardial infarction [facility])     Past Surgical History:  Procedure Laterality Date   APPENDECTOMY     COLONOSCOPY WITH PROPOFOL  N/A 11/20/2014   Procedure: COLONOSCOPY WITH PROPOFOL ;  Surgeon: Louanne KANDICE Muse, MD;  Location: [facility] ENDOSCOPY;  Service: Endoscopy;  Laterality: N/A;   COLONOSCOPY WITH PROPOFOL  N/A 03/19/2022   Procedure: COLONOSCOPY WITH PROPOFOL ;  Surgeon: Unk Corinn Skiff, MD;  Location: [facility] ENDOSCOPY;  Service: Gastroenterology;  Laterality: N/A;   TONSILLECTOMY AND ADENOIDECTOMY      Social History   Socioeconomic History   Marital status: Married    Spouse name: Not on file   Number of children: Not on file   Years of education: Not on file   Highest education level: Not on file  Occupational History   Not on file  Tobacco Use   Smoking status: Former    Current packs/day: 0.00    Average packs/day: 1 pack/day for 51.0 years (51.0 ttl  pk-yrs)    Types: Cigarettes    Start date: 10/01/1971    Quit date: 10/01/2022    Years since quitting: 1.3   Smokeless tobacco: Never  Vaping Use   Vaping status: Never Used  Substance and Sexual Activity   Alcohol use: Yes    Comment: evenings   Drug use: No   Sexual activity: Not Currently  Other Topics Concern   Not on file  Social History Narrative   Not on file   Social Drivers of Health   Financial Resource Strain: Low Risk  (10/21/2023)   Overall Financial Resource Strain (CARDIA)    Difficulty of Paying Living Expenses: Not hard at all  Food Insecurity: No Food Insecurity (10/21/2023)   Hunger Vital Sign    Worried About Running Out of Food in the Last Year: Never true    Ran Out of Food in the Last Year: Never true  Transportation Needs: No Transportation Needs (10/21/2023)   PRAPARE - Administrator, Civil Service (Medical): No    Lack of Transportation (Non-Medical): No  Physical Activity: Sufficiently Active (10/21/2023)   Exercise Vital Sign    Days of Exercise per Week: 3 days    Minutes of Exercise per Session: 60 min  Stress: No Stress Concern Present (10/21/2023)   [employer] of Occupational Health - Occupational  Stress Questionnaire    Feeling of Stress: Only a little  Social Connections: Moderately Integrated (10/21/2023)   Social Connection and Isolation Panel    Frequency of Communication with Friends and Family: More than three times a week    Frequency of Social Gatherings with Friends and Family: Twice a week    Attends Religious Services: More than 4 times per year    Active Member of [employer] or Organizations: No    Attends Banker Meetings: Never    Marital Status: Married  Catering Manager Violence: Not At Risk (10/21/2023)   Humiliation, Afraid, Rape, and Kick questionnaire    Fear of Current or Ex-Partner: No    Emotionally Abused: No    Physically Abused: No    Sexually Abused: No   History reviewed. No pertinent  family history.  Current Outpatient Medications  Medication Sig Dispense Refill   aspirin  81 MG chewable tablet Chew 81 mg by mouth daily.     atorvastatin  (LIPITOR) 20 MG tablet Take 1 tablet (20 mg total) by mouth daily. 100 tablet 2   budeson-glycopyrrolate -formoterol  (BREZTRI  AEROSPHERE) 160-9-4.8 MCG/ACT AERO inhaler Inhale 2 puffs into the lungs in the morning and at bedtime. 10.7 g 11   dapagliflozin  propanediol (FARXIGA ) 10 MG TABS tablet Take 1 tablet (10 mg total) by mouth daily. 30 tablet 5   fluticasone  (FLONASE ) 50 MCG/ACT nasal spray Place 2 sprays into both nostrils daily. 16 g 6   hydrochlorothiazide  (HYDRODIURIL ) 25 MG tablet Take 1 tablet (25 mg total) by mouth daily. 90 tablet 1   losartan  (COZAAR ) 50 MG tablet TAKE 1 TABLET BY MOUTH DAILY 100 tablet 2   metoprolol  succinate (TOPROL -XL) 100 MG 24 hr tablet Take 1 tablet (100 mg total) by mouth daily. Take with or immediately following a meal. 90 tablet 1   Multiple Vitamin (MULTIVITAMIN WITH MINERALS) TABS tablet Take 1 tablet by mouth daily. 90 tablet 0   spironolactone  (ALDACTONE ) 25 MG tablet Take 1 tablet (25 mg total) by mouth daily. 30 tablet 5   VENTOLIN  HFA 108 (90 Base) MCG/ACT inhaler USE 2 INHALATIONS BY MOUTH EVERY 6 HOURS AS NEEDED FOR WHEEZING  OR SHORTNESS OF BREATH 54 g 3   No current facility-administered medications for this visit.    No Known Allergies   REVIEW OF SYSTEMS:   [X]  denotes positive finding, [ ]  denotes negative finding Cardiac  Comments:  Chest pain or chest pressure:    Shortness of breath upon exertion:    Short of breath when lying flat:    Irregular heart rhythm:        Vascular    Pain in calf, thigh, or hip brought on by ambulation:    Pain in feet at night that wakes you up from your sleep:     Blood clot in your veins:    Leg swelling:  X ankles      Pulmonary    Oxygen  at home:    Productive cough:     Wheezing:         Neurologic    Sudden weakness in arms or  legs:     Sudden numbness in arms or legs:     Sudden onset of difficulty speaking or slurred speech:    Temporary loss of vision in one eye:     Problems with dizziness:         Gastrointestinal    Blood in stool:     Vomited blood:  Genitourinary    Burning when urinating:     Blood in urine:        Psychiatric    Major depression:         Hematologic    Bleeding problems:    Problems with blood clotting too easily:        Skin    Rashes or ulcers:        Constitutional    Fever or chills:      PHYSICAL EXAMINATION:  Vitals:   02/10/24 1037  BP: (!) 144/89  Pulse: 83  Resp: (!) 22  Temp: 98.7 F (37.1 C)  TempSrc: Temporal  SpO2: 93%  Weight: 215 lb 3.2 oz (97.6 kg)  Height: 6' 3 (1.905 m)    General:  WDWN in NAD; vital signs documented above Gait: Not observed HENT: WNL, normocephalic Pulmonary: normal non-labored breathing , without wheezing Cardiac: regular HR Abdomen: soft, NT, no masses Skin: without rashes Vascular Exam/Pulses:  Right Left  Radial 2+ (normal) 2+ (normal)  Ulnar    Femoral 2+ (normal) 2+ (normal)  Popliteal    DP 2+ (normal) 2+ (normal)  PT     Extremities: without ischemic changes, without Gangrene , without cellulitis; without open wounds;  Musculoskeletal: no muscle wasting or atrophy  Neurologic: A&O X 3;  No focal weakness or paresthesias are detected Psychiatric:  The pt has Normal affect.   Non-Invasive Vascular Imaging:    Abdominal Aorta Findings:  +-----------+-------+----------+----------+--------+--------+--------------  ---+  Location  AP (cm)Trans (cm)PSV (cm/s)WaveformThrombusComments            +-----------+-------+----------+----------+--------+--------+--------------  ---+  Proximal  2.76   2.82      153                                           +-----------+-------+----------+----------+--------+--------+--------------  ---+  Mid       3.92   3.89      69                                             +-----------+-------+----------+----------+--------+--------+--------------  ---+  Distal    4.08   3.95                                slight  tortuosity  +-----------+-------+----------+----------+--------+--------+--------------  ---+  RT CIA Prox3.6    3.5       37                                            +-----------+-------+----------+----------+--------+--------+--------------  ---+  LT CIA Prox2.1    2.2       69                                            +-----------+-------+----------+----------+--------+--------+--------------   Inflow: A right common iliac artery aneurysm is present which is fusiform in morphology and demonstrates a moderate volume of wall adherent thrombus/plaque. This is estimated at 3.7 x 3.5 cm using  3D measurement tools, previously 3.3 cm. Right internal iliac artery demonstrates mild disease in the proximal segment and is patent. Mild atherosclerotic changes are present throughout the right external iliac artery which is tortuous and patent.    ASSESSMENT/PLAN: MCKADE GURKA is a 73 y.o. male presenting with aortoiliac aneurysmal disease.  We reviewed his CTA in detail.  This demonstrates 3.7 cm right common iliac artery aneurysm.  His left common iliac artery is measuring 2.4 cm, and is unchanged.  AAA is 4.7 cm.  I had a nice discussion with Joshua Schmidt regarding his aneurysmal disease.  We discussed that the size criteria for common iliac artery aneurysm repair is 3.5 cm.  He has surpassed this threshold.  We also discussed that he is at increased risk due to his pulmonary status.  I plan to discuss his care with my anesthesia colleagues as he may be a candidate for MAC anesthesia and local rather than intubation.  I will leave this up to them.  After discussing the risks and benefits of endovascular aortic repair with iliac branch endoprosthesis, Joshua Schmidt elected to proceed. I plan to reach out to  his cardiologist Dr. Ricka for cardiac clearance prior to moving forward the procedure.  Joshua Schmidt would like to make it through the holidays, and schedule in January, which I think is reasonable.  He is aware that I cannot promise that he will not have a problem in the interim.  We discussed the signs and symptoms of rupture, and I asked him to call 911 immediately should any of these occur.  I asked that he continue his aspirin  and statin therapy.    Fonda FORBES Rim, MD [facility] [phone]

## 2024-02-17 ENCOUNTER — Other Ambulatory Visit: Payer: Self-pay | Admitting: Internal Medicine

## 2024-02-17 DIAGNOSIS — J449 Chronic obstructive pulmonary disease, unspecified: Secondary | ICD-10-CM

## 2024-02-17 MED ORDER — BREZTRI AEROSPHERE 160-9-4.8 MCG/ACT IN AERO: 2.0000 | INHALATION_SPRAY | Freq: Two times a day (BID) | RESPIRATORY_TRACT | 11 refills | Status: DC

## 2024-02-20 NOTE — Progress Notes (Unsigned)
 Advanced Heart Failure Clinic Note   Referring Physician: 08/25 admission PCP: Bernardo Fend, DO Cardiologist: Deatrice Cage, MD   Chief Complaint: HF visit    HPI:  Joshua Schmidt is a 73 y/o male with a history of COPD on 4 L at baseline, CAD status post MI, hypertension, hyperlipidemia, aortic and iliac aneurysms, chronic venous insufficiency, iron deficiency anemia, obstructive sleep apnea, HFpEF, PTSD, tobacco/ alcohol use, DVT without anticoagulation although vascular surgery note from 12/2020 shows that DVT was never confirmed.    Echo June 2024: EF of 60 to 65% mild LVH   Admitted 11/28/23 with hypoxia and AMS. Upon arrival patient hypoxic in triage at 74% on 2 L [place], this was increased to 4 L then 6 L but still at 83%. After adjustment to NRB 15 L the patient recovered to 91%. Treated for COPD/ HF exacerbation. Due to continued hypoxia, placed on bipap and then subsequently intubated. CT angio done due to history of DVT and was negative for PE but concern for possible aspiration pneumonitis. Elevated BNP/ troponin. CT head w/o contrast with no acute abnormality. No evidence of swelling/edema on imaging. Elevated troponin thought to be due to COPD exacerbation. Echo 11/29/23: EF 55-60%, moderate LVH, G1DD, normal RV, moderate dilatation of the aortic root and of the ascending aorta, measuring 45 mm.   He presents today with a chief complaint of a HF visit. Currently denies any shortness of breath, fatigue, chest pain, palpitations, dizziness. Has a little swelling around his ankles today but he has eaten saltier foods than normal. Had bacon/ sausage for breakfast and for lunch, had a spicy chicken sandwich with jalapenos on it. Wearing oxygen  2L but does remove on occasion when working on his deck. Home SBP 130's. Wearing Bipap nightly.   At last HF visit, 12.5mg  spironolactone  was started. He has not gotten lab work drawn since initiation of the medication.   Stopped smoking at recent  admission. Drinking 1-2 cans (12 oz) beer nightly. On weekends up to 5-6 beers / night. Denies any drug use.  Drinks 1-2 pots of caffeine coffee daily  ROS: All systems negative except what is listed in HPI, PMH and Problem List  Past Medical History:  Diagnosis Date   AAA (abdominal aortic aneurysm)    Aneurysm    COPD (chronic obstructive pulmonary disease) (HCC)    Coronary artery disease    Erythrocytosis 12/01/2018   Hyperlipidemia    Hypertension    Myocardial infarction [facility])     Current Outpatient Medications  Medication Sig Dispense Refill   aspirin  81 MG chewable tablet Chew 81 mg by mouth daily.     atorvastatin  (LIPITOR) 20 MG tablet Take 1 tablet (20 mg total) by mouth daily. 100 tablet 2   budesonide -glycopyrrolate -formoterol  (BREZTRI  AEROSPHERE) 160-9-4.8 MCG/ACT AERO inhaler Inhale 2 puffs into the lungs in the morning and at bedtime. 10.7 g 11   dapagliflozin  propanediol (FARXIGA ) 10 MG TABS tablet Take 1 tablet (10 mg total) by mouth daily. 30 tablet 5   fluticasone  (FLONASE ) 50 MCG/ACT nasal spray Place 2 sprays into both nostrils daily. 16 g 6   hydrochlorothiazide  (HYDRODIURIL ) 25 MG tablet Take 1 tablet (25 mg total) by mouth daily. 90 tablet 1   losartan  (COZAAR ) 50 MG tablet TAKE 1 TABLET BY MOUTH DAILY 100 tablet 2   metoprolol  succinate (TOPROL -XL) 100 MG 24 hr tablet Take 1 tablet (100 mg total) by mouth daily. Take with or immediately following a meal. 90 tablet 1  Multiple Vitamin (MULTIVITAMIN WITH MINERALS) TABS tablet Take 1 tablet by mouth daily. 90 tablet 0   spironolactone  (ALDACTONE ) 25 MG tablet Take 1 tablet (25 mg total) by mouth daily. 30 tablet 5   VENTOLIN  HFA 108 (90 Base) MCG/ACT inhaler USE 2 INHALATIONS BY MOUTH EVERY 6 HOURS AS NEEDED FOR WHEEZING  OR SHORTNESS OF BREATH 54 g 3   No current facility-administered medications for this visit.    No Known Allergies    Social History   Socioeconomic History   Marital status: Married     Spouse name: Not on file   Number of children: Not on file   Years of education: Not on file   Highest education level: Not on file  Occupational History   Not on file  Tobacco Use   Smoking status: Former    Current packs/day: 0.00    Average packs/day: 1 pack/day for 51.0 years (51.0 ttl pk-yrs)    Types: Cigarettes    Start date: 10/01/1971    Quit date: 10/01/2022    Years since quitting: 1.3   Smokeless tobacco: Never  Vaping Use   Vaping status: Never Used  Substance and Sexual Activity   Alcohol use: Yes    Comment: evenings   Drug use: No   Sexual activity: Not Currently  Other Topics Concern   Not on file  Social History Narrative   Not on file   Social Drivers of Health   Financial Resource Strain: Low Risk  (10/21/2023)   Overall Financial Resource Strain (CARDIA)    Difficulty of Paying Living Expenses: Not hard at all  Food Insecurity: No Food Insecurity (10/21/2023)   Hunger Vital Sign    Worried About Running Out of Food in the Last Year: Never true    Ran Out of Food in the Last Year: Never true  Transportation Needs: No Transportation Needs (10/21/2023)   PRAPARE - Administrator, Civil Service (Medical): No    Lack of Transportation (Non-Medical): No  Physical Activity: Sufficiently Active (10/21/2023)   Exercise Vital Sign    Days of Exercise per Week: 3 days    Minutes of Exercise per Session: 60 min  Stress: No Stress Concern Present (10/21/2023)   [employer] of Occupational Health - Occupational Stress Questionnaire    Feeling of Stress: Only a little  Social Connections: Moderately Integrated (10/21/2023)   Social Connection and Isolation Panel    Frequency of Communication with Friends and Family: More than three times a week    Frequency of Social Gatherings with Friends and Family: Twice a week    Attends Religious Services: More than 4 times per year    Active Member of [employer] or Organizations: No    Attends Tax Inspector Meetings: Never    Marital Status: Married  Catering Manager Violence: Not At Risk (10/21/2023)   Humiliation, Afraid, Rape, and Kick questionnaire    Fear of Current or Ex-Partner: No    Emotionally Abused: No    Physically Abused: No    Sexually Abused: No     No family history on file.  There were no vitals filed for this visit.  Wt Readings from Last 3 Encounters:  02/10/24 97.6 kg  02/01/24 97.1 kg  01/13/24 98.7 kg   Lab Results  Component Value Date   CREATININE 0.88 01/10/2024   CREATININE 0.96 12/07/2023   CREATININE 0.73 12/04/2023    PHYSICAL EXAM:  General: Well appearing.  Cor: No JVD.  Regular rhythm, rate.  Lungs: clear Abdomen: soft, nontender, nondistended. Extremities: 1+ pitting edema bilateral lower legs Neuro:. Affect pleasant   ECG: not done   ASSESSMENT & PLAN:  1: HFpEF- - suspect due to previous MI, severe COPD, alcohol use - NYHA class I - euvolemic - weight stable from last visit here 1 month ago - Echo 06/ 2024: EF of 60 to 65% mild LVH  - Echo 11/29/23: EF 55-60%, moderate LVH, G1DD, normal RV, moderate dilatation of the aortic root and of the ascending aorta, measuring 45 mm.  - continue farxiga  10mg  daily - continue hydrochlorothiazide  25mg  daily - continue losartan  50mg  daily.  - continue metoprolol  succinate 100mg  daily - increase spironolactone  to 25mg  daily.  - BMET today - Wear compression socks daily with removal at bedtime - discussed decreasing coffee intake (currently drinking 1-2 pots) and substituting water  for the coffee.  - doesn't eat out much normally but today he has had more sodium than usual (bacon/ sausage/ spicy chicken sandwich)  - BNP 11/28/23 was 752.8  2: HTN- - BP 148/75. Increasing spironolactone  per above - saw PCP Neill) 09/25 - BMET 01/10/24 reviewed: sodium 140 potassium 4.0 creatinine 0.88 & GFR 90 - BMET today  3: Aortic and iliac aneurysms- - saw cardiology Marsa) 10/25 - saw  vascular 10/25  4: HLD- - LDL 11/29/23 was 46 - continue atorvastatin  20mg  daily  5: COPD stage 3- - saw pulmonology Herlene) 03/25 - wearing bipap nightly - oxygen  at 2L around the clock although does remove it when working on his deck and using certain power tools  6: Tobacco use- - no tobacco - drinks 1-2 cans (12oz) of beer nightly. Weekends it can be up to 5-6 beers. Reviewed the importance of decreasing alcohol intake.    Return in sooner if needed.    Ellouise Class, FNP-C / Solomon Kahmari Herard, FNP-S 02/21/24

## 2024-02-21 ENCOUNTER — Ambulatory Visit: Payer: Self-pay | Admitting: Family

## 2024-02-21 ENCOUNTER — Ambulatory Visit (HOSPITAL_BASED_OUTPATIENT_CLINIC_OR_DEPARTMENT_OTHER): Admitting: Family

## 2024-02-21 ENCOUNTER — Encounter: Payer: Self-pay | Admitting: Family

## 2024-02-21 ENCOUNTER — Other Ambulatory Visit
Admission: RE | Admit: 2024-02-21 | Discharge: 2024-02-21 | Disposition: A | Discharge status: Home / Self Care | Source: Ambulatory Visit | Attending: Family | Admitting: Family

## 2024-02-21 VITALS — BP 133/93 | HR 75 | Wt 220.0 lb

## 2024-02-21 DIAGNOSIS — E785 Hyperlipidemia, unspecified: Secondary | ICD-10-CM | POA: Diagnosis not present

## 2024-02-21 DIAGNOSIS — I10 Essential (primary) hypertension: Secondary | ICD-10-CM | POA: Diagnosis not present

## 2024-02-21 DIAGNOSIS — I7121 Aneurysm of the ascending aorta, without rupture: Secondary | ICD-10-CM | POA: Diagnosis not present

## 2024-02-21 DIAGNOSIS — I5032 Chronic diastolic (congestive) heart failure: Secondary | ICD-10-CM | POA: Diagnosis not present

## 2024-02-21 DIAGNOSIS — J449 Chronic obstructive pulmonary disease, unspecified: Secondary | ICD-10-CM

## 2024-02-21 DIAGNOSIS — Z720 Tobacco use: Secondary | ICD-10-CM

## 2024-02-21 LAB — BASIC METABOLIC PANEL WITH GFR
Anion gap: 12 (ref 5–15)
BUN: 11 mg/dL (ref 8–23)
CO2: 27 mmol/L (ref 22–32)
Calcium: 9.5 mg/dL (ref 8.9–10.3)
Chloride: 101 mmol/L (ref 98–111)
Creatinine, Ser: 0.79 mg/dL (ref 0.61–1.24)
GFR, Estimated: >60 mL/min (ref >60)
Glucose, Bld: 109 mg/dL — ABNORMAL HIGH (ref 70–99)
Potassium: 4.2 mmol/L (ref 3.5–5.1)
Sodium: 140 mmol/L (ref 135–145)

## 2024-02-21 NOTE — Patient Instructions (Signed)
 Medication Changes:  No medication changes today!   Follow-Up in: Please follow up with the Advanced Heart Failure Clinic in 6 with Ellouise Class, FNP.   Thank you for choosing [facility].    At the Advanced Heart Failure Clinic, you and your health needs are our priority. We have a designated team specialized in the treatment of Heart Failure. This Care Team includes your primary Heart Failure Specialized Cardiologist (physician), Advanced Practice Providers (APPs- Physician Assistants and Nurse Practitioners), and Pharmacist who all work together to provide you with the care you need, when you need it.   You may see any of the following providers on your designated Care Team at your next follow up:  Dr. Toribio Fuel Dr. Ezra Shuck Dr. Ria Commander Dr. Morene Brownie Ellouise Class, FNP Jaun Bash, RPH-CPP  Please be sure to bring in all your medications bottles to every appointment.   Need to Contact Us :  If you have any questions or concerns before your next appointment please send us  a message through [place] or call our office at [phone].    TO LEAVE A MESSAGE FOR THE NURSE SELECT OPTION 2, PLEASE LEAVE A MESSAGE INCLUDING: YOUR NAME DATE OF BIRTH CALL BACK NUMBER REASON FOR CALL**this is important as we prioritize the call backs  YOU WILL RECEIVE A CALL BACK THE SAME DAY AS LONG AS YOU CALL BEFORE 4:00 PM

## 2024-02-22 ENCOUNTER — Ambulatory Visit
Admission: RE | Admit: 2024-02-22 | Discharge: 2024-02-22 | Disposition: A | Discharge status: Home / Self Care | Source: Ambulatory Visit | Attending: Acute Care | Admitting: Acute Care

## 2024-02-22 DIAGNOSIS — Z87891 Personal history of nicotine dependence: Secondary | ICD-10-CM | POA: Insufficient documentation

## 2024-02-22 DIAGNOSIS — Z122 Encounter for screening for malignant neoplasm of respiratory organs: Secondary | ICD-10-CM | POA: Diagnosis present

## 2024-02-29 ENCOUNTER — Other Ambulatory Visit: Payer: Self-pay

## 2024-02-29 DIAGNOSIS — Z87891 Personal history of nicotine dependence: Secondary | ICD-10-CM

## 2024-02-29 DIAGNOSIS — Z122 Encounter for screening for malignant neoplasm of respiratory organs: Secondary | ICD-10-CM

## 2024-02-29 DIAGNOSIS — F17210 Nicotine dependence, cigarettes, uncomplicated: Secondary | ICD-10-CM

## 2024-03-12 ENCOUNTER — Other Ambulatory Visit: Payer: Self-pay | Admitting: Internal Medicine

## 2024-03-12 DIAGNOSIS — J449 Chronic obstructive pulmonary disease, unspecified: Secondary | ICD-10-CM

## 2024-03-14 ENCOUNTER — Telehealth: Payer: Self-pay

## 2024-03-14 NOTE — Telephone Encounter (Signed)
 Attempted to reach pt to schedule surgery. Left VM for him to return our call.

## 2024-03-16 ENCOUNTER — Telehealth (HOSPITAL_BASED_OUTPATIENT_CLINIC_OR_DEPARTMENT_OTHER): Payer: Self-pay

## 2024-03-16 NOTE — Telephone Encounter (Signed)
 Requested Prescriptions  Pending Prescriptions Disp Refills   albuterol  (VENTOLIN  HFA) 108 (90 Base) MCG/ACT inhaler [Pharmacy Med Name: ALBUTEROL  HFA 90MCG/ACT (V)] 54 g 1    Sig: USE 2 INHALATIONS BY MOUTH EVERY 6 HOURS AS NEEDED FOR WHEEZING  OR SHORTNESS OF BREATH     Pulmonology:  Beta Agonists 2 Failed - 03/16/2024 10:03 AM      Failed - Last BP in normal range    BP Readings from Last 1 Encounters:  02/21/24 (!) 133/93         Passed - Last Heart Rate in normal range    Pulse Readings from Last 1 Encounters:  02/21/24 75         Passed - Valid encounter within last 12 months    Recent Outpatient Visits           2 months ago Chronic heart failure with preserved ejection fraction [facility])   [facility] Bernardo Fend, DO   3 months ago Hospital discharge follow-up   [facility] Bernardo Fend, DO   4 months ago Hypertension, unspecified type   [facility] Bernardo Fend, DO   7 months ago Chronic obstructive pulmonary disease, unspecified COPD type [facility])   [facility] Bernardo Fend, [place]

## 2024-03-16 NOTE — Telephone Encounter (Signed)
 Dr. Darron  You saw this patient on 02/01/2024. Per protocol we request that you comment on his cardiac risk to proceed with EVAR ASAP since it has been less than 2 months since evaluated in the office. Please send your comment to P CV Pre-Op Pool.  Thank you, Joshua Satterfield DNP, ANP, AACC.

## 2024-03-16 NOTE — Telephone Encounter (Signed)
   Pre-operative Risk Assessment    Patient Name: Joshua Schmidt  DOB: [birth year] MRN: [id number]   Date of last office visit: 02/21/24 with Donette (Heart Failure) Date of next office visit: 04/11/24 with [facility]  Request for Surgical Clearance    Procedure:  EVAR  Date of Surgery:  Clearance TBD  - ASAP                               Surgeon:  Dr. Lanis Socks Group or Practice Name:  [facility]  Phone number:  [phone] Fax number:  [phone]   Type of Clearance Requested:   - Medical  - Pharmacy:  Hold Aspirin  not indicated   Type of Anesthesia:  General    Additional requests/questions:    SignedAugustin JONETTA Schmidt   03/16/2024, 1:45 PM

## 2024-03-17 NOTE — Telephone Encounter (Signed)
 He is at moderate risk from a cardiac standpoint with no need for ischemic cardiac evaluation.

## 2024-03-17 NOTE — Telephone Encounter (Signed)
   Patient Name: Joshua Schmidt  DOB: January 15, 1951 MRN: [id number]  Primary Cardiologist: Deatrice Cage, MD  Chart reviewed as part of pre-operative protocol coverage. Per Dr. Cage, He is at moderate risk from a cardiac standpoint with no need for ischemic cardiac evaluation. Dr. Cage states they do not usually hold aspirin  for EVAR.  Will route this bundled recommendation to requesting provider via Epic fax function. Please call with questions.   Ahmeer Tuman N Mae Cianci, PA-C 03/17/2024, 3:13 PM

## 2024-03-24 ENCOUNTER — Encounter: Payer: Self-pay | Admitting: Cardiovascular Disease

## 2024-03-24 ENCOUNTER — Ambulatory Visit: Attending: Cardiovascular Disease | Admitting: Cardiovascular Disease

## 2024-03-24 VITALS — BP 138/90 | HR 65 | Ht 75.0 in | Wt 220.5 lb

## 2024-03-24 DIAGNOSIS — E785 Hyperlipidemia, unspecified: Secondary | ICD-10-CM

## 2024-03-24 DIAGNOSIS — I5032 Chronic diastolic (congestive) heart failure: Secondary | ICD-10-CM | POA: Diagnosis not present

## 2024-03-24 DIAGNOSIS — I7121 Aneurysm of the ascending aorta, without rupture: Secondary | ICD-10-CM

## 2024-03-24 DIAGNOSIS — I10 Essential (primary) hypertension: Secondary | ICD-10-CM

## 2024-03-24 MED ORDER — METOPROLOL SUCCINATE ER 100 MG PO TB24: 100.0000 mg | ORAL_TABLET | Freq: Every day | ORAL | 1 refills | Status: DC

## 2024-03-24 MED ORDER — DAPAGLIFLOZIN PROPANEDIOL 10 MG PO TABS: 10.0000 mg | ORAL_TABLET | Freq: Every day | ORAL | 3 refills | Status: DC

## 2024-03-24 NOTE — Progress Notes (Signed)
 Cardiology Office Note   Date:  03/24/2024   ID:  Joshua Schmidt, DOB May 05, 1950, MRN [id number]  PCP:  Joshua Fend, DO  Cardiologist:   Joshua Cage, MD   Chief Complaint  Patient presents with   Follow-up    Care coordination for vascular surgery. Meds reviewed verbally with pt.      History of Present Illness: Joshua Schmidt is a 73 y.o. male who is here today for follow-up visit regarding aortic and iliac aneurysms and possible bicuspid aortic valve.   The patient reports possibly having myocardial infarction at home years ago but did not seek medical attention.  He has chronic medical conditions that include essential hypertension, hyperlipidemia, tobacco use and COPD.  He has no family history of aortic aneurysm or coronary artery disease.  He used to smoke 1-1/2 pack/day but briefly quit in 2024 and currently smokes 6 to 8 cigarettes a day. He is known to have ascending aortic aneurysm as well as coronary calcifications.  He also has history of chronic venous insufficiency with normal ABI.  He is status post sclerotherapy involving the right lower extremity.  He underwent a Lexiscan  Myoview  in February of 2024 which showed no evidence of ischemia with normal ejection fraction.  He was hospitalized in August of 2025 with shortness of breath and mental status change after he removed his oxygen  while he was asleep.  He was hypoxic on presentation.  He was placed on BiPAP but vomited and aspirated leading to intubation and mechanical ventilatory support.  His BNP was elevated at 752.  CTA of the chest was negative for PE.  An echocardiogram was done and showed normal LV systolic function with stable size ascending aorta at 45 mm.  He uses oxygen  only as needed and currently is not hypoxic.  He denies chest pain or worsening dyspnea. He reports that he takes carvedilol  12.5 mg once daily and metoprolol .  I could not find in his chart to prescribe that. His iliac  aneurysms are enlarging and will need endovascular intervention.   Past Medical History:  Diagnosis Date   AAA (abdominal aortic aneurysm)    Aneurysm    COPD (chronic obstructive pulmonary disease) (HCC)    Coronary artery disease    Erythrocytosis 12/01/2018   Hyperlipidemia    Hypertension    Myocardial infarction Joshua Schmidt)     Past Surgical History:  Procedure Laterality Date   APPENDECTOMY     COLONOSCOPY WITH PROPOFOL  N/A 11/20/2014   Procedure: COLONOSCOPY WITH PROPOFOL ;  Surgeon: Joshua KANDICE Muse, MD;  Location: [facility] ENDOSCOPY;  Service: Endoscopy;  Laterality: N/A;   COLONOSCOPY WITH PROPOFOL  N/A 03/19/2022   Procedure: COLONOSCOPY WITH PROPOFOL ;  Surgeon: Joshua Corinn Skiff, MD;  Location[facility] ENDOSCOPY;  Service: Gastroenterology;  Laterality: N/A;   TONSILLECTOMY AND ADENOIDECTOMY       Current Outpatient Medications  Medication Sig Dispense Refill   albuterol  (VENTOLIN  HFA) 108 (90 Base) MCG/ACT inhaler USE 2 INHALATIONS BY MOUTH EVERY 6 HOURS AS NEEDED FOR WHEEZING  OR SHORTNESS OF BREATH 54 g 3   aspirin  81 MG chewable tablet Chew 81 mg by mouth daily.     atorvastatin  (LIPITOR) 20 MG tablet Take 1 tablet (20 mg total) by mouth daily. 100 tablet 2   budesonide -glycopyrrolate -formoterol  (BREZTRI  AEROSPHERE) 160-9-4.8 MCG/ACT AERO inhaler Inhale 2 puffs into the lungs in the morning and at bedtime. 10.7 g 11   carvedilol  (COREG ) 12.5 MG tablet Take 12.5 mg by mouth 2 (two) times  daily.     dapagliflozin  propanediol (FARXIGA ) 10 MG TABS tablet Take 1 tablet (10 mg total) by mouth daily. 30 tablet 5   fluticasone  (FLONASE ) 50 MCG/ACT nasal spray Place 2 sprays into both nostrils daily. 16 g 6   hydrochlorothiazide  (HYDRODIURIL ) 25 MG tablet Take 1 tablet (25 mg total) by mouth daily. 90 tablet 1   losartan  (COZAAR ) 50 MG tablet TAKE 1 TABLET BY MOUTH DAILY 100 tablet 2   metoprolol  succinate (TOPROL -XL) 100 MG 24 hr tablet Take 1 tablet (100 mg total) by mouth daily.  Take with or immediately following a meal. 90 tablet 1   Multiple Vitamin (MULTIVITAMIN WITH MINERALS) TABS tablet Take 1 tablet by mouth daily. 90 tablet 0   spironolactone  (ALDACTONE ) 25 MG tablet Take 1 tablet (25 mg total) by mouth daily. 30 tablet 5   No current facility-administered medications for this visit.    Allergies:   Patient has no known allergies.    Social History:  The patient  reports that he has been smoking cigarettes. He started smoking about 52 years ago. He has a 51 pack-year smoking history. He has never used smokeless tobacco. He reports current alcohol use. He reports that he does not use drugs.   Family History:  The patient's family history is not on file.    ROS:  Please see the history of present illness.   Otherwise, review of systems are positive for none.   All other systems are reviewed and negative.    PHYSICAL EXAM: VS:  BP (!) 138/90 (BP Location: Left Arm, Patient Position: Sitting, Cuff Size: Normal)   Pulse 65   Ht 6' 3 (1.905 m)   Wt 220 lb 8 oz (100 kg)   SpO2 96%   BMI 27.56 kg/m  , BMI Body mass index is 27.56 kg/m. GEN: Well nourished, well developed, in no acute distress  HEENT: normal  Neck: no JVD, carotid bruits, or masses Cardiac: RRR; no  rubs, or gallops,no edema .  1/6 systolic murmur in the aortic area. Respiratory:  clear to auscultation bilaterally, normal work of breathing.  Diminished breath sounds bilaterally GI: soft, nontender, nondistended, + BS MS: no deformity or atrophy  Skin: warm and dry, no rash Neuro:  Strength and sensation are intact Psych: euthymic mood, full affect   EKG:  EKG is ordered today. The ekg ordered today demonstrates: Sinus rhythm with 1st degree A-V block Left axis deviation When compared with ECG of 01-Feb-2024 14:31, Abberant conduction is no longer Present Vent. rate has decreased BY  32 BPM   Recent Labs: 11/28/2023: B Natriuretic Peptide 752.8 11/29/2023: TSH 0.292 12/03/2023:  Magnesium 2.3 12/07/2023: ALT 43; Hemoglobin 17.2; Platelets 242 02/21/2024: BUN 11; Creatinine, Ser 0.79; Potassium 4.2; Sodium 140    Lipid Panel    Component Value Date/Time   CHOL 97 11/29/2023 0345   TRIG 109 11/29/2023 0345   HDL 29 (L) 11/29/2023 0345   CHOLHDL 3.3 11/29/2023 0345   VLDL 22 11/29/2023 0345   LDLCALC 46 11/29/2023 0345   LDLCALC 54 10/26/2023 0941      Wt Readings from Last 3 Encounters:  03/24/24 220 lb 8 oz (100 kg)  02/21/24 220 lb (99.8 kg)  02/10/24 215 lb 3.2 oz (97.6 kg)          02/17/2022    1:54 PM  PAD Screen  Previous PAD dx? Yes  Previous surgical procedure? Yes  Pain with walking? No  Feet/toe relief with dangling? No  Painful, non-healing ulcers? No  Extremities discolored? No      ASSESSMENT AND PLAN:  1.  Ascending aortic aneurysm: This is below threshold for surgical repair.  Given his COPD and the fact that he is now oxygen  dependent, he is not going to be a surgical candidate for aortic aneurysm repair.  Most recent CT in November showed that the ascending aorta was 4.9 cm in diameter.  2.  Preop cardiovascular evaluation for endovascular repair of iliac artery aneurysm: He had a stress test last year that showed no evidence of ischemia and recent echocardiogram that showed normal ejection fraction with no significant valvular abnormalities.  His functional capacity is above 4 METS.  He is at low risk from a cardiac standpoint.  Biggest issue is underlying COPD but fortunately, he does not require oxygen  all the time.  3.  Essential hypertension: His blood pressure is well-controlled on current medications.  4.  Hyperlipidemia: Continue atorvastatin  20 mg once daily.  Most recent lipid profile showed an LDL of 46.  5.  Tobacco use: I discussed with him the importance of smoking cessation.  6.  Abdominal aortic and iliac artery aneurysm: This has progressed with right common iliac artery aneurysm at 3.7 cm on most recent CTA.   The patient will undergo endovascular repair by Dr.Robins next month.    7.  Chronic diastolic heart failure: He appears to be euvolemic.  Continue Farxiga  and spironolactone .  I switched him from carvedilol  back to Toprol .   Disposition:   FU with me in 4 months  Signed,  Joshua Cage, MD  03/24/2024 8:58 AM    [facility]

## 2024-03-24 NOTE — Patient Instructions (Signed)
 Medication Instructions:  STOP the Carvedilol  RESUME Metoprolol  (Toprol ) 100 mg once daily  *If you need a refill on your cardiac medications before your next appointment, please call your pharmacy*  Lab Work: None ordered If you have labs (blood work) drawn today and your tests are completely normal, you will receive your results only by: MyChart Message (if you have MyChart) OR A paper copy in the mail If you have any lab test that is abnormal or we need to change your treatment, we will call you to review the results.  Testing/Procedures: None ordered  Follow-Up: At [facility], you and your health needs are our priority.  As part of our continuing mission to provide you with exceptional heart care, our providers are all part of one team.  This team includes your primary Cardiologist (physician) and Advanced Practice Providers or APPs (Physician Assistants and Nurse Practitioners) who all work together to provide you with the care you need, when you need it.  Your next appointment:   4 month(s)  Provider:   You may see Deatrice Cage, MD or one of the following Advanced Practice Providers on your designated Care Team:   Lonni Meager, NP Lesley Maffucci, PA-C Bernardino Bring, PA-C Cadence Meggett, PA-C Tylene Lunch, NP Barnie Hila, NP    We recommend signing up for the patient portal called MyChart.  Sign up information is provided on this After Visit Summary.  MyChart is used to connect with patients for Virtual Visits (Telemedicine).  Patients are able to view lab/test results, encounter notes, upcoming appointments, etc.  Non-urgent messages can be sent to your provider as well.   To learn more about what you can do with MyChart, go to [email or web address].

## 2024-03-27 ENCOUNTER — Telehealth: Payer: Self-pay

## 2024-03-27 NOTE — Telephone Encounter (Signed)
 Attempted to call for surgery scheduling. Joshua Schmidt

## 2024-03-30 ENCOUNTER — Other Ambulatory Visit: Payer: Self-pay

## 2024-03-30 DIAGNOSIS — I7121 Aneurysm of the ascending aorta, without rupture: Secondary | ICD-10-CM

## 2024-04-10 ENCOUNTER — Telehealth: Payer: Self-pay | Admitting: Family

## 2024-04-10 NOTE — Progress Notes (Unsigned)
 "  Advanced Heart Failure Clinic Note   Referring Physician: 08/25 admission PCP: Joshua Fend, DO Cardiologist: Deatrice Cage, MD   Chief Complaint: HF   HPI:  Joshua Schmidt is a 73 y/o male with a history of COPD on 4 L at baseline, CAD status post MI, hypertension, hyperlipidemia, aortic and iliac aneurysms, chronic venous insufficiency, iron deficiency anemia, obstructive sleep apnea, HFpEF, PTSD, tobacco/ alcohol use, DVT without anticoagulation although vascular surgery note from 12/2020 shows that DVT was never confirmed.    Echo June 2024: EF of 60 to 65% mild LVH   Admitted 11/28/23 with hypoxia and AMS. Upon arrival patient hypoxic in triage at 74% on 2 L Naplate, this was increased to 4 L then 6 L but still at 83%. After adjustment to NRB 15 L the patient recovered to 91%. Treated for COPD/ HF exacerbation. Due to continued hypoxia, placed on bipap and then subsequently intubated. CT angio done due to history of DVT and was negative for PE but concern for possible aspiration pneumonitis. Elevated BNP/ troponin. CT head w/o contrast with no acute abnormality. No evidence of swelling/edema on imaging. Elevated troponin thought to be due to COPD exacerbation. Echo 11/29/23: EF 55-60%, moderate LVH, G1DD, normal RV, moderate dilatation of the aortic root and of the ascending aorta, measuring 45 mm.   He presents today for a HF visit with no acute HF concerns. Currently denies shortness of breath, fatigue, chest pain, palpitations, edema, dizziness or difficulty sleeping. Sleeps well with BiPAP nightly.   Has decreased smoking to 6 cigarettes, peak at 2ppd, and is working to stop. Has decreased drinking  to 1-2 cans (12 oz) beer every other night. No longer drinking socially. Drinks on average 18 cups of coffee daily.   Has upcoming surgery in Feb for iliac and aorta aneurysms.    ROS: All systems negative except what is listed in HPI, PMH and Problem List  Past Medical History:  Diagnosis  Date   AAA (abdominal aortic aneurysm)    Aneurysm    COPD (chronic obstructive pulmonary disease) (HCC)    Coronary artery disease    Erythrocytosis 12/01/2018   Hyperlipidemia    Hypertension    Myocardial infarction [facility])     Current Outpatient Medications  Medication Sig Dispense Refill   albuterol  (VENTOLIN  HFA) 108 (90 Base) MCG/ACT inhaler USE 2 INHALATIONS BY MOUTH EVERY 6 HOURS AS NEEDED FOR WHEEZING  OR SHORTNESS OF BREATH 54 g 3   aspirin  81 MG chewable tablet Chew 81 mg by mouth daily.     atorvastatin  (LIPITOR) 20 MG tablet Take 1 tablet (20 mg total) by mouth daily. 100 tablet 2   budesonide -glycopyrrolate -formoterol  (BREZTRI  AEROSPHERE) 160-9-4.8 MCG/ACT AERO inhaler Inhale 2 puffs into the lungs in the morning and at bedtime. 10.7 g 11   dapagliflozin  propanediol (FARXIGA ) 10 MG TABS tablet Take 1 tablet (10 mg total) by mouth daily. 90 tablet 3   fluticasone  (FLONASE ) 50 MCG/ACT nasal spray Place 2 sprays into both nostrils daily. 16 g 6   hydrochlorothiazide  (HYDRODIURIL ) 25 MG tablet Take 1 tablet (25 mg total) by mouth daily. 90 tablet 1   losartan  (COZAAR ) 50 MG tablet TAKE 1 TABLET BY MOUTH DAILY 100 tablet 2   metoprolol  succinate (TOPROL -XL) 100 MG 24 hr tablet Take 1 tablet (100 mg total) by mouth daily. Take with or immediately following a meal. 90 tablet 1   Multiple Vitamin (MULTIVITAMIN WITH MINERALS) TABS tablet Take 1 tablet by mouth daily. 90  tablet 0   spironolactone  (ALDACTONE ) 25 MG tablet Take 1 tablet (25 mg total) by mouth daily. 30 tablet 5   No current facility-administered medications for this visit.    No Known Allergies    Social History   Socioeconomic History   Marital status: Married    Spouse name: Not on file   Number of children: Not on file   Years of education: Not on file   Highest education level: Not on file  Occupational History   Not on file  Tobacco Use   Smoking status: Every Day    Current packs/day: 0.00     Average packs/day: 1 pack/day for 51.0 years (51.0 ttl pk-yrs)    Types: Cigarettes    Start date: 10/01/1971    Last attempt to quit: 10/01/2022    Years since quitting: 1.5   Smokeless tobacco: Never  Vaping Use   Vaping status: Never Used  Substance and Sexual Activity   Alcohol use: Yes    Comment: evenings   Drug use: No   Sexual activity: Not Currently  Other Topics Concern   Not on file  Social History Narrative   Not on file   Social Drivers of Health   Tobacco Use: High Risk (03/24/2024)   Patient History    Smoking Tobacco Use: Every Day    Smokeless Tobacco Use: Never    Passive Exposure: Not on file  Financial Resource Strain: Low Risk (10/21/2023)   Overall Financial Resource Strain (CARDIA)    Difficulty of Paying Living Expenses: Not hard at all  Food Insecurity: No Food Insecurity (10/21/2023)   Epic    Worried About Radiation Protection Practitioner of Food in the Last Year: Never true    Ran Out of Food in the Last Year: Never true  Transportation Needs: No Transportation Needs (10/21/2023)   Epic    Lack of Transportation (Medical): No    Lack of Transportation (Non-Medical): No  Physical Activity: Sufficiently Active (10/21/2023)   Exercise Vital Sign    Days of Exercise per Week: 3 days    Minutes of Exercise per Session: 60 min  Stress: No Stress Concern Present (10/21/2023)   [employer] of Occupational Health - Occupational Stress Questionnaire    Feeling of Stress: Only a little  Social Connections: Moderately Integrated (10/21/2023)   Social Connection and Isolation Panel    Frequency of Communication with Friends and Family: More than three times a week    Frequency of Social Gatherings with Friends and Family: Twice a week    Attends Religious Services: More than 4 times per year    Active Member of [employer] or Organizations: No    Attends Banker Meetings: Never    Marital Status: Married  Catering Manager Violence: Not At Risk (10/21/2023)   Epic     Fear of Current or Ex-Partner: No    Emotionally Abused: No    Physically Abused: No    Sexually Abused: No  Depression (PHQ2-9): Low Risk (10/21/2023)   Depression (PHQ2-9)    PHQ-2 Score: 0  Alcohol Screen: Low Risk (10/21/2023)   Alcohol Screen    Last Alcohol Screening Score (AUDIT): 5  Housing: Unknown (10/21/2023)   Epic    Unable to Pay for Housing in the Last Year: No    Number of Times Moved in the Last Year: Not on file    Homeless in the Last Year: No  Utilities: Not At Risk (10/21/2023)   Epic  Threatened with loss of utilities: No  Health Literacy: Adequate Health Literacy (10/21/2023)   B1300 Health Literacy    Frequency of need for help with medical instructions: Never     No family history on file.  Vitals:   04/11/24 1351  BP: 134/74  Pulse: 70  SpO2: 93%  Weight: 221 lb 8 oz (100.5 kg)   Wt Readings from Last 3 Encounters:  04/11/24 221 lb 8 oz (100.5 kg)  03/24/24 220 lb 8 oz (100 kg)  02/21/24 220 lb (99.8 kg)   Lab Results  Component Value Date   CREATININE 0.79 02/21/2024   CREATININE 0.88 01/10/2024   CREATININE 0.96 12/07/2023    PHYSICAL EXAM:  General: Well appearing.  Cor: No JVD. Regular rhythm, rate.  Lungs: clear Abdomen: soft, nontender, nondistended. Extremities: no edema Neuro:. Affect pleasant   ECG: 03/24/24 NSR with 1st degree AV block   ASSESSMENT & PLAN:  1: HFpEF- - suspect due to previous MI, severe COPD, alcohol use - NYHA class I - euvolemic - weight stable from last visit here 6 weeks ago - Echo 06/ 2024: EF of 60 to 65% mild LVH  - Echo 11/29/23: EF 55-60%, moderate LVH, G1DD, normal RV, moderate dilatation of the aortic root and of the ascending aorta, measuring 45 mm.  - continue farxiga  10mg  daily - continue hydrochlorothiazide  25mg  daily - continue losartan  50mg  daily.  - continue metoprolol  succinate 100mg  daily - continue spironolactone  to 25mg  daily.  - discussed decreasing coffee intake (currently  drinking 18 cups) and substituting water  for the coffee although he says that he doesn't like water .  - BNP 11/28/23 was 752.8  2: HTN- - BP 134/74 - saw PCP Neill) 09/25 - BMET 02/21/24 reviewed: sodium 140 potassium 4.2 creatinine 0.79 & GFR >60  3: Aortic and iliac aneurysms- - saw cardiology Marsa) 12/25 - saw vascular 10/25  - having iliac aneurysm repaired 02/26, AAA is below threshold of surgical repair at 4.9cm   4: HLD- - LDL 11/29/23 was 46 - continue atorvastatin  20mg  daily  5: COPD stage 3- - saw pulmonology Herlene) 03/25 - wearing bipap nightly - oxygen  at 2L around the clock although does remove it when working on his deck and using certain power tools  6: Tobacco/Alcohol use- - currently smoking ~6 cigarettes daily - drinks 1-2 cans (12oz) of beer every other night - congratulated on decreasing smoking and alcohol but discussed need for cessation  - actively working to stop both    Return in 6 months, sooner if needed.   I spent 20 minutes reviewing records, interviewing/ examing patient and managing plan/ orders. '  Ellouise DELENA Class FNP-C 04/11/2024  "

## 2024-04-10 NOTE — Telephone Encounter (Signed)
 Called to confirm/remind patient of their appointment at the [facility] on 04/11/24.   Appointment:   [x] Confirmed  [] Left mess   [] No answer/No voice mail  [] VM Full/unable to leave message  [] Phone not in service  Patient reminded to bring all medications and/or complete list.  Confirmed patient has transportation. Gave directions, instructed to utilize valet parking.

## 2024-04-11 ENCOUNTER — Ambulatory Visit: Attending: Family | Admitting: Family

## 2024-04-11 ENCOUNTER — Encounter: Payer: Self-pay | Admitting: Family

## 2024-04-11 VITALS — BP 134/74 | HR 70 | Wt 221.5 lb

## 2024-04-11 DIAGNOSIS — I252 Old myocardial infarction: Secondary | ICD-10-CM | POA: Insufficient documentation

## 2024-04-11 DIAGNOSIS — I2510 Atherosclerotic heart disease of native coronary artery without angina pectoris: Secondary | ICD-10-CM | POA: Insufficient documentation

## 2024-04-11 DIAGNOSIS — E782 Mixed hyperlipidemia: Secondary | ICD-10-CM

## 2024-04-11 DIAGNOSIS — I7121 Aneurysm of the ascending aorta, without rupture: Secondary | ICD-10-CM

## 2024-04-11 DIAGNOSIS — I5032 Chronic diastolic (congestive) heart failure: Secondary | ICD-10-CM | POA: Insufficient documentation

## 2024-04-11 DIAGNOSIS — I714 Abdominal aortic aneurysm, without rupture, unspecified: Secondary | ICD-10-CM | POA: Diagnosis not present

## 2024-04-11 DIAGNOSIS — I440 Atrioventricular block, first degree: Secondary | ICD-10-CM | POA: Diagnosis not present

## 2024-04-11 DIAGNOSIS — F17210 Nicotine dependence, cigarettes, uncomplicated: Secondary | ICD-10-CM | POA: Diagnosis not present

## 2024-04-11 DIAGNOSIS — G4733 Obstructive sleep apnea (adult) (pediatric): Secondary | ICD-10-CM | POA: Diagnosis not present

## 2024-04-11 DIAGNOSIS — I723 Aneurysm of iliac artery: Secondary | ICD-10-CM | POA: Insufficient documentation

## 2024-04-11 DIAGNOSIS — I872 Venous insufficiency (chronic) (peripheral): Secondary | ICD-10-CM | POA: Insufficient documentation

## 2024-04-11 DIAGNOSIS — Z7984 Long term (current) use of oral hypoglycemic drugs: Secondary | ICD-10-CM | POA: Diagnosis not present

## 2024-04-11 DIAGNOSIS — J449 Chronic obstructive pulmonary disease, unspecified: Secondary | ICD-10-CM | POA: Insufficient documentation

## 2024-04-11 DIAGNOSIS — I110 Hypertensive heart disease with heart failure: Secondary | ICD-10-CM | POA: Insufficient documentation

## 2024-04-11 DIAGNOSIS — Z720 Tobacco use: Secondary | ICD-10-CM | POA: Diagnosis not present

## 2024-04-11 DIAGNOSIS — I10 Essential (primary) hypertension: Secondary | ICD-10-CM

## 2024-04-11 DIAGNOSIS — E785 Hyperlipidemia, unspecified: Secondary | ICD-10-CM | POA: Diagnosis not present

## 2024-04-11 DIAGNOSIS — Z79899 Other long term (current) drug therapy: Secondary | ICD-10-CM | POA: Insufficient documentation

## 2024-04-11 NOTE — Patient Instructions (Signed)
 It was good to see you today!

## 2024-04-20 ENCOUNTER — Encounter: Payer: Self-pay | Admitting: Pulmonary Disease

## 2024-04-20 ENCOUNTER — Ambulatory Visit: Admitting: Pulmonary Disease

## 2024-04-20 VITALS — BP 130/86 | HR 67 | Temp 97.6°F | Ht 75.0 in | Wt 216.8 lb

## 2024-04-20 DIAGNOSIS — Z01811 Encounter for preprocedural respiratory examination: Secondary | ICD-10-CM

## 2024-04-20 DIAGNOSIS — Z87891 Personal history of nicotine dependence: Secondary | ICD-10-CM

## 2024-04-20 DIAGNOSIS — J9612 Chronic respiratory failure with hypercapnia: Secondary | ICD-10-CM | POA: Diagnosis not present

## 2024-04-20 DIAGNOSIS — G4733 Obstructive sleep apnea (adult) (pediatric): Secondary | ICD-10-CM

## 2024-04-20 DIAGNOSIS — D751 Secondary polycythemia: Secondary | ICD-10-CM

## 2024-04-20 DIAGNOSIS — J449 Chronic obstructive pulmonary disease, unspecified: Secondary | ICD-10-CM

## 2024-04-20 NOTE — Progress Notes (Signed)
 "  Subjective:    Patient ID: Joshua Schmidt, male    DOB: [birth year], 74 y.o.   MRN: [id number]  Patient Care Team: Bernardo Fend, DO as PCP - General (Internal Medicine) Darron Deatrice LABOR, MD as PCP - Cardiology (Cardiology) Babara Call, MD as Consulting Physician (Oncology)  Chief Complaint  Patient presents with   COPD    No breathing problems. Using Breztri  daily.     BACKGROUND/INTERVAL:74 year old male former smoker(quit June 2024) seen as pulmonary consult during hospitalization June 2024 for COPD exacerbation and acute hypoxic/hypercarbic respiratory failure and Diastolic CHF decompensation.  Last seen here 24 June 2023.  On Breztri  and albuterol  as needed.  Patient discontinued supplemental O2 on own.  Uses BiPAP at nighttime (order after hospitalization August 2025 by hospitalist). Participates in the lung cancer CT chest screening program. Medical history significant for DVT, alcohol abuse, erythrocytosis followed by hematology. Product/process development scientist, Hospital Doctor, exposure to organic dust and asbestos.   HPI Discussed the use of AI scribe software for clinical note transcription with the patient, who gave verbal consent to proceed.  History of Present Illness   Joshua Schmidt is a 74 year old male with COPD who presents for a follow-up visit.  He is managing well on Breztri  and continues to use it effectively. He uses a BiPAP machine at night for chronic respiratory failure ordered by hospitalist after hospitalization in August 2025.  He has a portable oxygen  concentrator (bought on own) for activities like yard work, although he rarely uses it. He monitors his oxygen  levels two to three times a day, especially when active.  He has received his flu shot and is up to date on all vaccinations. A lung cancer screening in November showed emphysema but no new concerns. He is scheduled for a vascular procedure in February.  No need for oxygen  during the  day, except when working in the yard.   He has not had any fevers, chills or sweats.  No cough or sputum production.  No chest pain, orthopnea or paroxysmal nocturnal dyspnea.    DATA 10/02/2022 2D echo: EF 60-65%, right ventricular size normal right ventricular systolic function normal 09/29/2022 Venous Dopplers: negative 09/28/2022 CT chest: neg PE , 5 mm left lower lobe nodule, dilated ascending thoracic aorta 4.5 cm. 10/02/2022 ABG: pH 7.45, pCO2 78, pO2 69, bicarb 54 01/07/2023 PFTs: FEV1 1.83 L or 48% predicted, FVC 3.02 L or 58% predicted, FEV1/FVC 61%, no bronchodilator response.  Lung volumes showed air trapping.  Diffusion capacity severely reduced.  Consistent with severe COPD on the basis of emphysema. 02/16/2023 LDCT chest: Centrilobular and paraseptal emphysema.  Multiple small lung nodules stable without suspicious pulmonary nodule or mass.  Lung RADS 2.  Thoracic aneurysm as previously noted (follows with cardiology and vascular). 03/24/2023 alpha 1 antitrypsin: Phenotype MM, level 129 mg/dL (normal)  Review of Systems A 10 point review of systems was performed and it is as noted above otherwise negative.   Patient Active Problem List   Diagnosis Date Noted   Acute on chronic hypoxic respiratory failure (HCC) 11/28/2023   Acute on chronic respiratory failure with hypoxia and hypercapnia (HCC) 11/28/2023   COPD (chronic obstructive pulmonary disease) (HCC) 11/06/2022   Chronic respiratory failure with hypoxia (HCC) 11/06/2022   Volume overload state of heart 10/05/2022   Acute on chronic diastolic CHF (congestive heart failure) (HCC) 09/28/2022   Lung nodule 09/28/2022   Myocardial injury 09/28/2022   Acute respiratory failure with hypoxia (  HCC) 09/28/2022   HLD (hyperlipidemia) 09/28/2022   HTN (hypertension) 09/28/2022   COPD exacerbation (HCC) 09/28/2022   CAD (coronary artery disease) 09/28/2022   DVT (deep venous thrombosis) (HCC) 09/28/2022   Sleep apnea  06/10/2022   History of adenomatous polyp of colon 03/19/2022   Adenomatous polyp of descending colon 03/19/2022   Traumatic tear of left rotator cuff 02/09/2022   Aneurysm of ascending aorta without rupture 02/09/2022   Iron deficiency 10/22/2021   Varicose veins with pain 05/04/2021   Chronic venous insufficiency 05/04/2021   Acute deep vein thrombosis (DVT) of right lower extremity (HCC) 12/03/2020   Encounter for annual physical exam 09/06/2020   Alcohol abuse 09/06/2020   Bruit of left carotid artery 08/30/2020   Erythrocytosis 12/01/2018   Former smoker 10/27/2018   Sebaceous cyst 11/08/2015    Social History   Tobacco Use   Smoking status: Every Day    Current packs/day: 0.50    Average packs/day: 1 pack/day for 51.9 years (51.5 ttl pk-yrs)    Types: Cigarettes    Start date: 10/01/1971    Last attempt to quit: 10/01/2022   Smokeless tobacco: Never  Substance Use Topics   Alcohol use: Yes    Comment: evenings    Allergies[1]  Active Medications[2]  Immunization History  Administered Date(s) Administered   Fluad Quad(high Dose 65+) 01/13/2022   INFLUENZA, HIGH DOSE SEASONAL PF 01/12/2018, 12/23/2023   Influenza-Unspecified 01/06/2021, 12/01/2022   PFIZER(Purple Top)SARS-COV-2 Vaccination 06/03/2019, 06/27/2019, 01/11/2020, 07/26/2020   PNEUMOCOCCAL CONJUGATE-20 02/10/2021, 12/01/2022   Pfizer Covid-19 Vaccine Bivalent Booster 5y-11y 01/06/2021   Pfizer(Comirnaty)Fall Seasonal Vaccine 12 years and older 12/29/2022, 12/28/2023   Respiratory Syncytial Virus Vaccine,Recomb Aduvanted(Arexvy) 12/01/2022, 12/29/2022   Tdap 02/10/2021, 02/08/2023   Zoster Recombinant(Shingrix) 05/10/2014        Objective:     Vitals:   04/20/24 1528  BP: 130/86  Pulse: 67  Temp: 97.6 F (36.4 C)  Height: 6' 3 (1.905 m)  Weight: 216 lb 12.8 oz (98.3 kg)  SpO2: 94%  TempSrc: Temporal  BMI (Calculated): 27.1     SpO2: 94 %  GENERAL: Well-developed, overweight gentleman,  in no acute distress.  No conversational dyspnea.  Plethoric appearing. HEAD: Normocephalic, atraumatic.  EYES: Pupils equal, round, reactive to light.  No scleral icterus.  MOUTH: Poor dentition, oral mucosa moist.  No thrush. NECK: Supple. No thyromegaly. Trachea midline. No JVD.  No adenopathy. PULMONARY: Distant breath sounds bilaterally.  Coarse, no adventitious sounds otherwise. CARDIOVASCULAR: S1 and S2. Regular rate and rhythm.  No rubs, murmurs or gallops heard. ABDOMEN: Significant truncal obesity otherwise, benign. MUSCULOSKELETAL: No joint deformity, no clubbing, there is 1+ edema at the level of the ankles.  NEUROLOGIC: No overt focal deficit, gait not tested, speech is fluent. SKIN: Intact,warm,dry.  Chronic stasis changes noted. PSYCH: Mood and behavior normal.  ARISCAT score: 27 points (13% risk of in-hospital postop pulmonary complications)      Assessment & Plan:     ICD-10-CM   1. Stage 3 severe COPD by GOLD classification (HCC)  J44.9 Pulmonary function test    2. Chronic respiratory failure with hypercapnia (HCC)  J96.12    On BiPAP BiPAP ordered in August 2025 by hospitalist    3. Erythrocytosis due to alveolar hypoventilation  D75.1    R06.89     4. Preoperative respiratory examination  Z01.811     5. Former heavy tobacco smoker  Z87.891       Orders Placed This Encounter  Procedures  Pulmonary function test    Standing Status:   Future    Expiration Date:   04/20/2025    Where should this test be performed?:   Outpatient Pulmonary    What type of PFT is being ordered?:   Full PFT   Discussion:    Stage 3 severe chronic obstructive pulmonary disease (COPD) by GOLD classification COPD managed with Breztri  and BiPAP. No current need for supplemental oxygen  except during yard work. Oxygen  levels monitored regularly. Recent lung cancer screening in November showed emphysema but no new findings. Cleared for upcoming vascular surgery with mild to moderate  pulmonary risk. - Continue Breztri  as prescribed. - Continue BiPAP use at night. - Monitor oxygen  levels regularly, especially during yard work. - Scheduled follow-up appointment in four months with PFTs and ambulatory oximetry.     Preoperative respiratory examination Patient is an intermediate risk for proposed procedure of insertion of an endovascular stent.  Given patient's history with hypercapnic respiratory failure recommend  BiPAP use after anesthesia as necessary.   Advised if symptoms do not improve or worsen, to please contact office for sooner follow up or seek emergency care.    I spent 41 minutes of dedicated to the care of this patient on the date of this encounter to include pre-visit review of records, face-to-face time with the patient discussing conditions above, post visit ordering of testing, clinical documentation with the electronic health record, making appropriate referrals as documented, and communicating necessary findings to members of the patients care team.     C. Leita Sanders, MD Advanced Bronchoscopy P[facility]    *This note was generated using voice recognition software/Dragon and/or AI transcription program.  Despite best efforts to proofread, errors can occur which can change the meaning. Any transcriptional errors that result from this process are unintentional and may not be fully corrected at the time of dictation.     [1] No Known Allergies [2]  Current Meds  Medication Sig   albuterol  (VENTOLIN  HFA) 108 (90 Base) MCG/ACT inhaler USE 2 INHALATIONS BY MOUTH EVERY 6 HOURS AS NEEDED FOR WHEEZING  OR SHORTNESS OF BREATH   aspirin  81 MG chewable tablet Chew 81 mg by mouth daily.   atorvastatin  (LIPITOR) 20 MG tablet Take 1 tablet (20 mg total) by mouth daily.   budesonide -glycopyrrolate -formoterol  (BREZTRI  AEROSPHERE) 160-9-4.8 MCG/ACT AERO inhaler Inhale 2 puffs into the lungs in the morning and at bedtime.   dapagliflozin   propanediol (FARXIGA ) 10 MG TABS tablet Take 1 tablet (10 mg total) by mouth daily.   fluticasone  (FLONASE ) 50 MCG/ACT nasal spray Place 2 sprays into both nostrils daily.   hydrochlorothiazide  (HYDRODIURIL ) 25 MG tablet Take 1 tablet (25 mg total) by mouth daily.   losartan  (COZAAR ) 50 MG tablet TAKE 1 TABLET BY MOUTH DAILY   metoprolol  succinate (TOPROL -XL) 100 MG 24 hr tablet Take 1 tablet (100 mg total) by mouth daily. Take with or immediately following a meal.   Multiple Vitamin (MULTIVITAMIN WITH MINERALS) TABS tablet Take 1 tablet by mouth daily.   spironolactone  (ALDACTONE ) 25 MG tablet Take 1 tablet (25 mg total) by mouth daily.   "

## 2024-04-20 NOTE — Patient Instructions (Signed)
 VISIT SUMMARY:  You had a follow-up visit today to manage your COPD. You are doing well with your current medications and treatments, including Breztri  and BiPAP. You are also monitoring your oxygen  levels regularly and using supplemental oxygen  only when needed for activities like yard work. Your recent lung cancer screening showed no new concerns, and you are cleared for your upcoming vascular procedure.  YOUR PLAN:  -STAGE 3 SEVERE CHRONIC OBSTRUCTIVE PULMONARY DISEASE (COPD): COPD is a chronic lung condition that makes it hard to breathe. You are currently managing it well with Breztri  and BiPAP. You should continue using Breztri  as prescribed and your BiPAP machine at night. Keep monitoring your oxygen  levels regularly, especially during yard work. Your recent lung cancer screening showed no new issues, and you are cleared for your upcoming vascular surgery.  INSTRUCTIONS:  Please continue with your current medications and treatments. Monitor your oxygen  levels regularly, especially during yard work. Your next follow-up appointment is scheduled in four months, and it will include breathing tests.

## 2024-04-21 ENCOUNTER — Encounter: Payer: Self-pay | Admitting: Pulmonary Disease

## 2024-04-27 ENCOUNTER — Ambulatory Visit: Admitting: Internal Medicine

## 2024-05-03 ENCOUNTER — Other Ambulatory Visit: Payer: Self-pay | Admitting: Internal Medicine

## 2024-05-03 ENCOUNTER — Encounter: Payer: Self-pay | Admitting: Internal Medicine

## 2024-05-03 ENCOUNTER — Ambulatory Visit: Admitting: Internal Medicine

## 2024-05-03 ENCOUNTER — Other Ambulatory Visit: Payer: Self-pay

## 2024-05-03 VITALS — BP 132/84 | HR 68 | Temp 98.2°F | Resp 16 | Ht 75.0 in | Wt 219.0 lb

## 2024-05-03 DIAGNOSIS — I10 Essential (primary) hypertension: Secondary | ICD-10-CM | POA: Diagnosis not present

## 2024-05-03 DIAGNOSIS — J439 Emphysema, unspecified: Secondary | ICD-10-CM

## 2024-05-03 DIAGNOSIS — E782 Mixed hyperlipidemia: Secondary | ICD-10-CM | POA: Diagnosis not present

## 2024-05-03 DIAGNOSIS — I5032 Chronic diastolic (congestive) heart failure: Secondary | ICD-10-CM | POA: Diagnosis not present

## 2024-05-03 DIAGNOSIS — I7121 Aneurysm of the ascending aorta, without rupture: Secondary | ICD-10-CM

## 2024-05-03 MED ORDER — HYDROCHLOROTHIAZIDE 25 MG PO TABS: 25.0000 mg | ORAL_TABLET | Freq: Every day | ORAL | 1 refills | Status: DC

## 2024-05-03 MED ORDER — ATORVASTATIN CALCIUM 20 MG PO TABS: 20.0000 mg | ORAL_TABLET | Freq: Every day | ORAL | 2 refills | Status: DC

## 2024-05-03 MED ORDER — LOSARTAN POTASSIUM 50 MG PO TABS: 50.0000 mg | ORAL_TABLET | Freq: Every day | ORAL | 2 refills | Status: DC

## 2024-05-03 NOTE — Progress Notes (Signed)
 "  Established Patient Office Visit  Subjective    Patient ID: Joshua Schmidt, male    DOB: [birth year]  Age: 74 y.o. MRN: [id number]  CC:  Chief Complaint  Patient presents with   Medical Management of Chronic Issues    6 month recheck    HPI Joshua Schmidt presents for follow up on chronic medical conditions.   Discussed the use of AI scribe software for clinical note transcription with the patient, who gave verbal consent to proceed.  History of Present Illness  Joshua Schmidt is a 74 year old male with heart failure and aortic aneurysms who presents for medication management.  He is on a stable heart failure regimen with Farxiga  10 mg, hydrochlorothiazide  25 mg, losartan  50 mg, metoprolol  100 mg, and spironolactone  25 mg. He uses BiPAP at night and needs supplemental oxygen  only with strenuous activity such as yard work. His echocardiogram in August was stable.  He has known aortic aneurysms, with a chest CT in November showing a 4.9 cm aneurysm. He is scheduled for surgery on February 2 to repair both aneurysms and feels apprehensive about the upcoming procedure.  He is up to date on pneumonia and RSV vaccines and is due for his second shingles vaccine.  Hypertension: -Medications: Metoprolol  100 mg,  HCTZ 25 mg, Losartan  50 mg, Farxiga  10 mg, Spirnolactone 25 mg  -Patient is compliant with above medications and reports no side effects.   -Failed Meds: Lisinopril  due to facial swelling -Checking BP at home (average): 132-140/80 -Denies any SOB, CP, vision changes or symptoms of hypotension - does have BLE edema but much improved today -Following with Cardiology  HLD/History of MI/Aortic and Iliac Aneurysms/Chronic Venous Insufficiency:   -Medications: Lipitor 20 mg, aspirin  81 mg -Patient is compliant with above medications and reports no side effects.  -History of MI in 2022 for which he did not seek medical attention at the time, was diagnosed retroactively  -CTA  7/24 with dilated ascending thoracic aorta 4.5 cm, iliac duplex US  8/24 with 4 cm AAA, 3.4 cm right common iliac artery aneurysm without significant growth. - Per Cardiology note, patient no longer a candidate for surgical fixation now that he is oxygen  dependent so routine imaging is no longer required.  Of note, ascending thoracic aortic aneurysm minimally progressive to 4.7 cm on lung CT 11/24 -Last lipid panel: Lipid Panel     Component Value Date/Time   CHOL 97 11/29/2023 0345   TRIG 109 11/29/2023 0345   HDL 29 (L) 11/29/2023 0345   CHOLHDL 3.3 11/29/2023 0345   VLDL 22 11/29/2023 0345   LDLCALC 46 11/29/2023 0345   LDLCALC 54 10/26/2023 0941   COPD: -COPD status: better -Current medications: Breztri  and Albuterol  PRN -Oxygen  use: yes -requiring 2 L supplemental oxygen  with exertion, now on BiPap at night -Dyspnea frequency: Occasional  -Cough frequency: Daily, productive  -Limitation of activity: no -Pneumovax: Up to Date -Influenza: UTD -Annual lung cancer screening: 11/25 Lung-RADS-2  -Stopped smoking since hospitalization -Now following with Pulmonology  PTSD: -Not currently on medication -Had done counseling in the past but not interested it anything now  Health Maintance: -Blood work UTD -Colon cancer screening: colonoscopy 12/23, 3 polyps removed, plan to follow up in 1 year. Referral placed previously  Outpatient Encounter Medications as of 05/03/2024  Medication Sig   albuterol  (VENTOLIN  HFA) 108 (90 Base) MCG/ACT inhaler USE 2 INHALATIONS BY MOUTH EVERY 6 HOURS AS NEEDED FOR WHEEZING  OR SHORTNESS OF BREATH  aspirin  81 MG chewable tablet Chew 81 mg by mouth daily.   atorvastatin  (LIPITOR) 20 MG tablet Take 1 tablet (20 mg total) by mouth daily.   budesonide -glycopyrrolate -formoterol  (BREZTRI  AEROSPHERE) 160-9-4.8 MCG/ACT AERO inhaler Inhale 2 puffs into the lungs in the morning and at bedtime.   dapagliflozin  propanediol (FARXIGA ) 10 MG TABS tablet Take 1 tablet  (10 mg total) by mouth daily.   fluticasone  (FLONASE ) 50 MCG/ACT nasal spray Place 2 sprays into both nostrils daily.   hydrochlorothiazide  (HYDRODIURIL ) 25 MG tablet Take 1 tablet (25 mg total) by mouth daily.   losartan  (COZAAR ) 50 MG tablet TAKE 1 TABLET BY MOUTH DAILY   metoprolol  succinate (TOPROL -XL) 100 MG 24 hr tablet Take 1 tablet (100 mg total) by mouth daily. Take with or immediately following a meal.   Multiple Vitamin (MULTIVITAMIN WITH MINERALS) TABS tablet Take 1 tablet by mouth daily.   spironolactone  (ALDACTONE ) 25 MG tablet Take 1 tablet (25 mg total) by mouth daily.   No facility-administered encounter medications on file as of 05/03/2024.    Past Medical History:  Diagnosis Date   AAA (abdominal aortic aneurysm)    Aneurysm    COPD (chronic obstructive pulmonary disease) (HCC)    Coronary artery disease    Erythrocytosis 12/01/2018   Hyperlipidemia    Hypertension    Myocardial infarction [facility])     Past Surgical History:  Procedure Laterality Date   APPENDECTOMY     COLONOSCOPY WITH PROPOFOL  N/A 11/20/2014   Procedure: COLONOSCOPY WITH PROPOFOL ;  Surgeon: Louanne KANDICE Muse, MD;  Location: [facility] ENDOSCOPY;  Service: Endoscopy;  Laterality: N/A;   COLONOSCOPY WITH PROPOFOL  N/A 03/19/2022   Procedure: COLONOSCOPY WITH PROPOFOL ;  Surgeon: Unk Corinn Skiff, MD;  Location: [facility] ENDOSCOPY;  Service: Gastroenterology;  Laterality: N/A;   TONSILLECTOMY AND ADENOIDECTOMY      No family history on file.  Social History   Socioeconomic History   Marital status: Married    Spouse name: Not on file   Number of children: Not on file   Years of education: Not on file   Highest education level: Not on file  Occupational History   Not on file  Tobacco Use   Smoking status: Every Day    Current packs/day: 0.50    Average packs/day: 1 pack/day for 52.0 years (51.5 ttl pk-yrs)    Types: Cigarettes    Start date: 10/01/1971    Last attempt to quit: 10/01/2022    Smokeless tobacco: Never  Vaping Use   Vaping status: Never Used  Substance and Sexual Activity   Alcohol use: Yes    Comment: evenings   Drug use: No   Sexual activity: Not Currently  Other Topics Concern   Not on file  Social History Narrative   Not on file   Social Drivers of Health   Tobacco Use: High Risk (05/03/2024)   Patient History    Smoking Tobacco Use: Every Day    Smokeless Tobacco Use: Never    Passive Exposure: Not on file  Financial Resource Strain: Low Risk (10/21/2023)   Overall Financial Resource Strain (CARDIA)    Difficulty of Paying Living Expenses: Not hard at all  Food Insecurity: No Food Insecurity (10/21/2023)   Epic    Worried About Programme Researcher, Broadcasting/film/video in the Last Year: Never true    Ran Out of Food in the Last Year: Never true  Transportation Needs: No Transportation Needs (10/21/2023)   Epic    Lack of Transportation (Medical): No  Lack of Transportation (Non-Medical): No  Physical Activity: Sufficiently Active (10/21/2023)   Exercise Vital Sign    Days of Exercise per Week: 3 days    Minutes of Exercise per Session: 60 min  Stress: No Stress Concern Present (10/21/2023)   [employer] of Occupational Health - Occupational Stress Questionnaire    Feeling of Stress: Only a little  Social Connections: Moderately Integrated (10/21/2023)   Social Connection and Isolation Panel    Frequency of Communication with Friends and Family: More than three times a week    Frequency of Social Gatherings with Friends and Family: Twice a week    Attends Religious Services: More than 4 times per year    Active Member of [employer] or Organizations: No    Attends Banker Meetings: Never    Marital Status: Married  Catering Manager Violence: Not At Risk (10/21/2023)   Epic    Fear of Current or Ex-Partner: No    Emotionally Abused: No    Physically Abused: No    Sexually Abused: No  Depression (PHQ2-9): Low Risk (10/21/2023)   Depression (PHQ2-9)     PHQ-2 Score: 0  Alcohol Screen: Low Risk (10/21/2023)   Alcohol Screen    Last Alcohol Screening Score (AUDIT): 5  Housing: Unknown (10/21/2023)   Epic    Unable to Pay for Housing in the Last Year: No    Number of Times Moved in the Last Year: Not on file    Homeless in the Last Year: No  Utilities: Not At Risk (10/21/2023)   Epic    Threatened with loss of utilities: No  Health Literacy: Adequate Health Literacy (10/21/2023)   B1300 Health Literacy    Frequency of need for help with medical instructions: Never    Review of Systems  Respiratory:  Negative for cough, shortness of breath and wheezing.   Cardiovascular:  Negative for leg swelling.  All other systems reviewed and are negative.       Objective    BP 132/84 (Cuff Size: Large)   Pulse 68   Temp 98.2 F (36.8 C) (Oral)   Resp 16   Ht 6' 3 (1.905 m)   Wt 219 lb (99.3 kg)   SpO2 98%   BMI 27.37 kg/m   Physical Exam Constitutional:      Appearance: Normal appearance.  HENT:     Head: Normocephalic and atraumatic.  Eyes:     Conjunctiva/sclera: Conjunctivae normal.  Cardiovascular:     Rate and Rhythm: Normal rate and regular rhythm.  Pulmonary:     Effort: Pulmonary effort is normal.     Breath sounds: Normal breath sounds. No wheezing, rhonchi or rales.     Comments: Decreased air movement throughout  Musculoskeletal:     Right lower leg: No edema.     Left lower leg: No edema.  Skin:    General: Skin is warm and dry.  Neurological:     General: No focal deficit present.     Mental Status: He is alert. Mental status is at baseline.  Psychiatric:        Mood and Affect: Mood normal.        Behavior: Behavior normal.     Last CBC Lab Results  Component Value Date   WBC 10.8 12/07/2023   HGB 17.2 (H) 12/07/2023   HCT 54.1 (H) 12/07/2023   MCV 96.4 12/07/2023   MCH 30.7 12/07/2023   RDW 13.1 12/07/2023   PLT 242 12/07/2023  Last metabolic panel Lab Results  Component Value Date    GLUCOSE 109 (H) 02/21/2024   NA 140 02/21/2024   K 4.2 02/21/2024   CL 101 02/21/2024   CO2 27 02/21/2024   BUN 11 02/21/2024   CREATININE 0.79 02/21/2024   GFRNONAA >60 02/21/2024   CALCIUM  9.5 02/21/2024   PHOS 3.3 12/04/2023   PROT 6.4 12/07/2023   ALBUMIN 2.8 (L) 12/04/2023   BILITOT 0.9 12/07/2023   ALKPHOS 56 11/29/2023   AST 21 12/07/2023   ALT 43 12/07/2023   ANIONGAP 12 02/21/2024   Last lipids Lab Results  Component Value Date   CHOL 97 11/29/2023   HDL 29 (L) 11/29/2023   LDLCALC 46 11/29/2023   TRIG 109 11/29/2023   CHOLHDL 3.3 11/29/2023   Last hemoglobin A1c Lab Results  Component Value Date   HGBA1C 5.4 09/28/2022   Last thyroid  functions Lab Results  Component Value Date   TSH 0.292 (L) 11/29/2023   T3TOTAL 56 (L) 11/29/2023   Last vitamin D No results found for: 25OHVITD2, 25OHVITD3, VD25OH Last vitamin B12 and Folate Lab Results  Component Value Date   VITAMINB12 839 11/20/2022   FOLATE >24.0 11/20/2022        Assessment & Plan:   Assessment & Plan  Abdominal aortic aneurysm Largest aneurysm measures 4.9 cm. Scheduled for surgical repair on February 2nd, 2026, with Dr. Grayce. Discussed risks of rupture and benefits of surgery. He is psychologically prepared despite past hospital experiences. - Ensure surgical appointment on February 2nd, 2026, with Dr. Grayce in [place]. - Monitor blood pressure closely to reduce stress on aneurysms.  Heart failure Managed with Farxiga , hydrochlorothiazide , losartan , metoprolol , and spironolactone . Farxiga  aids in preserving heart muscle function. Discussed potential side effects of Farxiga . - Continue Farxiga , hydrochlorothiazide , losartan , metoprolol , and spironolactone . - Monitor for symptoms of urinary tract infections, such as burning or urgency when urinating.  Essential hypertension Blood pressure well-controlled at 132/84 mmHg. Current medications include losartan , hydrochlorothiazide ,  and metoprolol . Control is crucial to reduce stress on aortic aneurysms. - Continue losartan , hydrochlorothiazide , and metoprolol . - Monitor blood pressure regularly.  Mixed hyperlipidemia Managed with Lipitor. - Continue Lipitor.  COPD  Symptoms stable, continue inhalers.   - hydrochlorothiazide  (HYDRODIURIL ) 25 MG tablet; Take 1 tablet (25 mg total) by mouth daily.  Dispense: 90 tablet; Refill: 1 - losartan  (COZAAR ) 50 MG tablet; Take 1 tablet (50 mg total) by mouth daily.  Dispense: 100 tablet; Refill: 2 - atorvastatin  (LIPITOR) 20 MG tablet; Take 1 tablet (20 mg total) by mouth daily.  Dispense: 100 tablet; Refill: 2   Return in about 2 months (around 07/01/2024).   Sharyle Fischer, DO   "

## 2024-05-04 NOTE — Telephone Encounter (Signed)
 Discontinued 12/04/23.  Requested Prescriptions  Pending Prescriptions Disp Refills   carvedilol  (COREG ) 12.5 MG tablet [Pharmacy Med Name: Carvedilol  12.5 MG Oral Tablet] 200 tablet 2    Sig: TAKE 1 TABLET BY MOUTH TWICE  DAILY     Cardiovascular: Beta Blockers 3 Passed - 05/04/2024 11:13 AM      Passed - Cr in normal range and within 360 days    Creat  Date Value Ref Range Status  12/07/2023 0.96 0.70 - 1.28 mg/dL Final   Creatinine, Ser  Date Value Ref Range Status  02/21/2024 0.79 0.61 - 1.24 mg/dL Final         Passed - AST in normal range and within 360 days    AST  Date Value Ref Range Status  12/07/2023 21 10 - 35 U/L Final         Passed - ALT in normal range and within 360 days    ALT  Date Value Ref Range Status  12/07/2023 43 9 - 46 U/L Final         Passed - Last BP in normal range    BP Readings from Last 1 Encounters:  05/03/24 132/84         Passed - Last Heart Rate in normal range    Pulse Readings from Last 1 Encounters:  05/03/24 68         Passed - Valid encounter within last 6 months    Recent Outpatient Visits           Yesterday Aneurysm of ascending aorta without rupture   [facility]end, DO   4 months ago Chronic heart failure with preserved ejection fraction [facility])   [facility]   4 months ago Hospital discharge follow-up   [facility] Bernardo Fend, DO   6 months ago Hypertension, unspecified type   [facility]end, DO   9 months ago Chronic obstructive pulmonary disease, unspecified COPD type [facility])   [facility] Bern[facility]end, DO       Future Appointments             In 2 months Dunn, Bernardino HERO, PA-C [place] [facility] at [facility]

## 2024-05-11 NOTE — Progress Notes (Addendum)
 Surgical Instructions   Your procedure is scheduled on May 15, 2024. Report to [facility] Main Entrance A at 5:30 A.M., then check in with the Admitting office. Any questions or running late day of surgery: call [phone]  Questions prior to your surgery date: call [phone], Monday-Friday, 8am-4pm. If you experience any cold or flu symptoms such as cough, fever, chills, shortness of breath, etc. between now and your scheduled surgery, please notify us  at the above number.     Remember:  Do not eat or drink after midnight the night before your surgery   Take these medicines the morning of surgery with A SIP OF WATER   aspirin   atorvastatin  (LIPITOR)  (BREZTRI  AEROSPHERE)  metoprolol  succinate (TOPROL -XL  dapagliflozin  propanediol (FARXIGA )   May take these medicines IF NEEDED: albuterol  (VENTOLIN  HFA)  inhaler MAY BRING WITH YOU fluticasone  (FLONASE )   One week prior to surgery, STOP taking any Aspirin  (unless otherwise instructed by your surgeon) Aleve, Naproxen, Ibuprofen, Motrin, Advil, Goody's, BC's, all herbal medications, fish oil, and non-prescription vitamins.                     Do NOT Smoke (Tobacco/Vaping) for 24 hours prior to your procedure.  If you use a CPAP at night, you may bring your mask/headgear for your overnight stay.   You will be asked to remove any contacts, glasses, piercing's, hearing aid's, dentures/partials prior to surgery. Please bring cases for these items if needed.    Your surgeon will determine if you are to be admitted or discharged the same day.  Patients discharged the day of surgery will not be allowed to drive home, and someone needs to stay with them for 24 hours.  SURGICAL WAITING ROOM VISITATION Patients may have no more than 2 support people in the waiting area - these visitors may rotate.   Pre-op nurse will coordinate an appropriate time for 2 ADULT support persons, who may not rotate, to accompany patient in pre-op.   Children under the age of 22 must have an adult with them who is not the patient and must remain in the main waiting area with an adult.  If the patient needs to stay at the hospital during part of their recovery, the visitor guidelines for inpatient rooms apply.  Please refer to the [facility] website for the visitor guidelines for any additional information.   If you received a COVID test during your pre-op visit  it is requested that you wear a mask when out in public, stay away from anyone that may not be feeling well and notify your surgeon if you develop symptoms. If you have been in contact with anyone that has tested positive in the last 10 days please notify you surgeon.      Pre-operative CHG Bathing Instructions   You can play a key role in reducing the risk of infection after surgery. Your skin needs to be as free of germs as possible. You can reduce the number of germs on your skin by washing with CHG (chlorhexidine  gluconate) soap before surgery. CHG is an antiseptic soap that kills germs and continues to kill germs even after washing.   DO NOT use if you have an allergy to chlorhexidine /CHG or antibacterial soaps. If your skin becomes reddened or irritated, stop using the CHG and notify one of our RNs at [phone].              TAKE A SHOWER THE NIGHT BEFORE SURGERY  Please keep in mind the following:  DO NOT shave, including legs and underarms, 48 hours prior to surgery.   You may shave your face before/day of surgery.  Place clean sheets on your bed the night before surgery Use a clean washcloth (not used since being washed) for shower. DO NOT sleep with pet's night before surgery.  CHG Shower Instructions:  Wash your face and private area with normal soap. If you choose to wash your hair, wash first with your normal shampoo.  After you use shampoo/soap, rinse your hair and body thoroughly to remove shampoo/soap residue.  Turn the water  OFF and apply half the  bottle of CHG soap to a CLEAN washcloth.  Apply CHG soap ONLY FROM YOUR NECK DOWN TO YOUR TOES (washing for 3-5 minutes)  DO NOT use CHG soap on face, private areas, open wounds, or sores.  Pay special attention to the area where your surgery is being performed.  If you are having back surgery, having someone wash your back for you may be helpful. Wait 2 minutes after CHG soap is applied, then you may rinse off the CHG soap.  Pat dry with a clean towel  Put on clean pajamas    Additional instructions for the day of surgery: If you choose, you may shower the morning of surgery with an antibacterial soap.  DO NOT APPLY any lotions, deodorants, cologne, or perfumes.   Do not wear jewelry or makeup Do not wear nail polish, gel polish, artificial nails, or any other type of covering on natural nails (fingers and toes) Do not bring valuables to the hospital. [facility] is not responsible for valuables/personal belongings. Put on clean/comfortable clothes.  Please brush your teeth.  Ask your nurse before applying any prescription medications to the skin.

## 2024-05-12 ENCOUNTER — Inpatient Hospital Stay (HOSPITAL_COMMUNITY)
Admission: RE | Admit: 2024-05-12 | Discharge: 2024-05-12 | Disposition: A | Discharge status: Home / Self Care | Source: Ambulatory Visit

## 2024-05-12 ENCOUNTER — Telehealth: Payer: Self-pay | Admitting: Internal Medicine

## 2024-05-12 ENCOUNTER — Telehealth: Payer: Self-pay | Admitting: Family

## 2024-05-12 DIAGNOSIS — E782 Mixed hyperlipidemia: Secondary | ICD-10-CM

## 2024-05-12 DIAGNOSIS — I5032 Chronic diastolic (congestive) heart failure: Secondary | ICD-10-CM

## 2024-05-12 DIAGNOSIS — I10 Essential (primary) hypertension: Secondary | ICD-10-CM

## 2024-05-12 MED ORDER — ATORVASTATIN CALCIUM 20 MG PO TABS: 20.0000 mg | ORAL_TABLET | Freq: Every day | ORAL | 0 refills | Status: AC

## 2024-05-12 MED ORDER — HYDROCHLOROTHIAZIDE 25 MG PO TABS: 25.0000 mg | ORAL_TABLET | Freq: Every day | ORAL | 0 refills | Status: AC

## 2024-05-12 MED ORDER — LOSARTAN POTASSIUM 50 MG PO TABS: 50.0000 mg | ORAL_TABLET | Freq: Every day | ORAL | 0 refills | Status: AC

## 2024-05-12 NOTE — Telephone Encounter (Signed)
 Copied from CRM #[id number]. Topic: Clinical - Medication Question >> May 12, 2024 10:27 AM Robinson DEL wrote: Reason for CRM: Love with [facility] states she's following up on fax request sent to office to obtain all active medications for patient sent on 1/28.  [facility] [id number] fax

## 2024-05-15 ENCOUNTER — Encounter (HOSPITAL_COMMUNITY): Admission: RE | Payer: Self-pay | Source: Home / Self Care

## 2024-05-15 ENCOUNTER — Inpatient Hospital Stay (HOSPITAL_COMMUNITY): Admission: RE | Admit: 2024-05-15 | Admitting: Vascular Surgery

## 2024-05-15 ENCOUNTER — Other Ambulatory Visit: Payer: Self-pay | Admitting: Internal Medicine

## 2024-05-15 DIAGNOSIS — I5032 Chronic diastolic (congestive) heart failure: Secondary | ICD-10-CM

## 2024-05-15 DIAGNOSIS — E782 Mixed hyperlipidemia: Secondary | ICD-10-CM

## 2024-05-15 DIAGNOSIS — J449 Chronic obstructive pulmonary disease, unspecified: Secondary | ICD-10-CM

## 2024-05-15 DIAGNOSIS — I10 Essential (primary) hypertension: Secondary | ICD-10-CM

## 2024-05-16 MED ORDER — ALBUTEROL SULFATE HFA 108 (90 BASE) MCG/ACT IN AERS: 2.0000 | INHALATION_SPRAY | Freq: Four times a day (QID) | RESPIRATORY_TRACT | 1 refills | Status: AC | PRN

## 2024-05-16 MED ORDER — DAPAGLIFLOZIN PROPANEDIOL 10 MG PO TABS: 10.0000 mg | ORAL_TABLET | Freq: Every day | ORAL | 0 refills | Status: AC

## 2024-05-16 MED ORDER — METOPROLOL SUCCINATE ER 100 MG PO TB24: 100.0000 mg | ORAL_TABLET | Freq: Every day | ORAL | 0 refills | Status: AC

## 2024-05-16 NOTE — Telephone Encounter (Signed)
 Requested medications are due for refill today.  unsure  Requested medications are on the active medications list.  yes  Last refill. Faxiga 03/24/2024 #90 3 rf, Metoprolol  is historical  Future visit scheduled.   yes  Notes to clinic.  Expired labs for Farxiga , and metoprolol  is historical.    Requested Prescriptions  Pending Prescriptions Disp Refills   dapagliflozin  propanediol (FARXIGA ) 10 MG TABS tablet 90 tablet 3    Sig: Take 1 tablet (10 mg total) by mouth daily.     Endocrinology:  Diabetes - SGLT2 Inhibitors Failed - 05/16/2024  4:17 PM      Failed - HBA1C is between 0 and 7.9 and within 180 days    Hgb A1c MFr Bld  Date Value Ref Range Status  09/28/2022 5.4 4.8 - 5.6 % Final    Comment:    (NOTE) Pre diabetes:          5.7%-6.4%  Diabetes:              >6.4%  Glycemic control for   <7.0% adults with diabetes          Passed - Cr in normal range and within 360 days    Creat  Date Value Ref Range Status  12/07/2023 0.96 0.70 - 1.28 mg/dL Final   Creatinine, Ser  Date Value Ref Range Status  02/21/2024 0.79 0.61 - 1.24 mg/dL Final         Passed - eGFR in normal range and within 360 days    GFR calc Af Amer  Date Value Ref Range Status  11/30/2018 >60 >60 mL/min Final   GFR, Estimated  Date Value Ref Range Status  02/21/2024 >60 >60 mL/min Final    Comment:    (NOTE) Calculated using the CKD-EPI Creatinine Equation (2021)    eGFR  Date Value Ref Range Status  01/10/2024 90 >59 mL/min/1.73 Final         Passed - Valid encounter within last 6 months    Recent Outpatient Visits           1 week ago Aneurysm of ascending aorta without rupture   [facility], DO   4 months ago Chronic heart failure with preserved ejection fraction [facility])   [place] [facility] Bern[facility] Fend, DO   5 months ago Hospital discharge follow-up   [facility] Bernardo [facility], DO   6 months ago Hypertension, unspecified type   [facility], DO   9 months ago Chronic obstructive pulmonary disease, unspecified COPD type [facility])   [facility] Bernardo Fend, DO       Future Appointments             In 2 months Dunn, Bernardino HERO, PA-C [place] [facility] at [facility]             metoprolol  succinate (TOPROL -XL) 100 MG 24 hr tablet      Sig: Take 1 tablet (100 mg total) by mouth daily. Take with or immediately following a meal.     Cardiovascular:  Beta Blockers Passed - 05/16/2024  4:17 PM      Passed - Last BP in normal range    BP Readings from Last 1 Encounters:  05/03/24 132/84         Passed - Last Heart Rate in normal range    Pulse Readings from Last 1 Encounters:  05/03/24 68  Passed - Valid encounter within last 6 months    Recent Outpatient Visits           1 week ago Aneurysm of ascending aorta without rupture   [facility], DO   4 months ago Chronic heart failure with preserved ejection fraction [facility])   [facility], DO   5 months ago Hospital discharge follow-up   [facility] Bernardo Fend, DO   6 months ago Hypertension, unspecified type   [facility] Bern[facility] Fend, DO   9 months ago Chronic obstructive pulmonary disease, unspecified COPD type [facility])   [facility] Bernardo Fend, DO       Future Appointments             In 2 months Dunn, Bernardino HERO, PA-C [place] [facility] at [facility]            Signed Prescriptions Disp Refills   albuterol  (VENTOLIN  HFA) 108 (90 Base) MCG/ACT inhaler 54 g 1    Sig: Inhale 2 puffs into the lungs every 6 (six) hours as needed for wheezing or shortness of breath.     Pulmonology:  Beta Agonists 2 Passed - 05/16/2024   4:17 PM      Passed - Last BP in normal range    BP Readings from Last 1 Encounters:  05/03/24 132/84         Passed - Last Heart Rate in normal range    Pulse Readings from Last 1 Encounters:  05/03/24 68         Passed - Valid encounter within last 12 months    Recent Outpatient Visits           1 week ago Aneurysm of ascending aorta without rupture   [facility], DO   4 months ago Chronic heart failure with preserved ejection fraction [facility])   [facility]end, DO   5 months ago Hospital discharge follow-up   [facility] Bern[facility] Fend, DO   6 months ago Hypertension, unspecified type   [facility] Bernardo Fend, DO   9 months ago Chronic obstructive pulmonary disease, unspecified COPD type [facility])   [facility] Bern[facility] Fend, DO       Future Appointments             In 2 months Dunn, Bernardino HERO, PA-C [place] [facility] at [facility] Prescriptions Disp Refills   losartan  (COZAAR ) 50 MG tablet 100 tablet 0    Sig: Take 1 tablet (50 mg total) by mouth daily.     Cardiovascular:  Angiotensin Receptor Blockers Passed - 05/16/2024  4:17 PM      Passed - Cr in normal range and within 180 days    Creat  Date Value Ref Range Status  12/07/2023 0.96 0.70 - 1.28 mg/dL Final   Creatinine, Ser  Date Value Ref Range Status  02/21/2024 0.79 0.61 - 1.24 mg/dL Final         Passed - K in normal range and within 180 days    Potassium  Date Value Ref Range Status  02/21/2024 4.2 3.5 - 5.1 mmol/L Final         Passed - Patient is not pregnant      Passed -  Last BP in normal range    BP Readings from Last 1 Encounters:  05/03/24 132/84         Passed - Valid encounter within last 6 months    Recent Outpatient Visits           1 week ago Aneurysm of ascending aorta  without rupture   [facility] Bern[facility]end, DO   4 months ago Chronic heart failure with preserved ejection fraction [facility])   [facility], DO   5 months ago Hospital discharge follow-up   [facility] Bern[facility] Fend, DO   6 months ago Hypertension, unspecified type   [facility] Bern[facility]end, DO   9 months ago Chronic obstructive pulmonary disease, unspecified COPD type [facility])   [place] [facility] Bernardo Fend, DO       Future Appointments             In 2 months Dunn, Bernardino HERO, PA-C [place] [facility] at [facility]             hydrochlorothiazide  (HYDRODIURIL ) 25 MG tablet 90 tablet 0    Sig: Take 1 tablet (25 mg total) by mouth daily.     Cardiovascular: Diuretics - Thiazide Passed - 05/16/2024  4:17 PM      Passed - Cr in normal range and within 180 days    Creat  Date Value Ref Range Status  12/07/2023 0.96 0.70 - 1.28 mg/dL Final   Creatinine, Ser  Date Value Ref Range Status  02/21/2024 0.79 0.61 - 1.24 mg/dL Final         Passed - K in normal range and within 180 days    Potassium  Date Value Ref Range Status  02/21/2024 4.2 3.5 - 5.1 mmol/L Final         Passed - Na in normal range and within 180 days    Sodium  Date Value Ref Range Status  02/21/2024 140 135 - 145 mmol/L Final  01/10/2024 140 134 - 144 mmol/L Final         Passed - Last BP in normal range    BP Readings from Last 1 Encounters:  05/03/24 132/84         Passed - Valid encounter within last 6 months    Recent Outpatient Visits           1 week ago Aneurysm of ascending aorta without rupture   [facility] Bernardo Fend, DO   4 months ago Chronic heart failure with preserved ejection fraction [facility])   [facility], DO   5 months ago Hospital  discharge follow-up   [facility] Bern[facility]end, DO   6 months ago Hypertension, unspecified type   [facility] Bern[facility]end, DO   9 months ago Chronic obstructive pulmonary disease, unspecified COPD type [facility])   [facility] Fend, DO       Future Appointments             In 2 months Dunn, Bernardino HERO, PA-C [place] [facility]             atorvastatin  (LIPITOR) 20 MG tablet 100 tablet 0    Sig: Take 1 tablet (20 mg total) by mouth daily.     Cardiovascular:  Antilipid - Statins Failed - 05/16/2024  4:17 PM      Failed - Lipid Panel in normal range within the last 12 months    Cholesterol  Date Value Ref Range Status  11/29/2023 97 0 - 200 mg/dL Final   LDL Cholesterol (Calc)  Date Value Ref Range Status  10/26/2023 54 mg/dL (calc) Final    Comment:    Reference range: <100 . Desirable range <100 mg/dL for primary prevention;   <70 mg/dL for patients with CHD or diabetic patients  with > or = 2 CHD risk factors. SABRA LDL-C is now calculated using the Martin-Hopkins  calculation, which is a validated novel method providing  better accuracy than the Friedewald equation in the  estimation of LDL-C.  Gladis APPLETHWAITE et al. SANDREA. 7986;689(80): 2061-2068  (http://education.[employer].com/faq/FAQ164)    LDL Cholesterol  Date Value Ref Range Status  11/29/2023 46 0 - 99 mg/dL Final    Comment:           Total Cholesterol/HDL:CHD Risk Coronary Heart Disease Risk Table                     Men   Women  1/2 Average Risk   3.4   3.3  Average Risk       5.0   4.4  2 X Average Risk   9.6   7.1  3 X Average Risk  23.4   11.0        Use the calculated Patient Ratio above and the CHD Risk Table to determine the patient's CHD Risk.        ATP III CLASSIFICATION (LDL):  <100     mg/dL   Optimal  899-870  mg/dL   Near or Above                    Optimal  130-159   mg/dL   Borderline  839-810  mg/dL   High  >809     mg/dL   Very High Performed at [facility], [address] [place], [place] [address]    HDL  Date Value Ref Range Status  11/29/2023 29 (L) >40 mg/dL Final   Triglycerides  Date Value Ref Range Status  11/29/2023 109 <150 mg/dL Final         Passed - Patient is not pregnant      Passed - Valid encounter within last 12 months    Recent Outpatient Visits           1 week ago Aneurysm of ascending aorta without rupture   [facility]   4 months ago Chronic heart failure with preserved ejection fraction [facility])   [facility] Bernardo Fend, DO   5 months ago Hospital discharge follow-up   [facility] Bern[facility] Fend, DO   6 months ago Hypertension, unspecified type   [facility] Bern[facility] Fend, DO   9 months ago Chronic obstructive pulmonary disease, unspecified COPD type [facility])   [facility] Bern[facility] Fend, DO       Future Appointments             In 2 months Dunn, Bernardino HERO, PA-C [place] [facility] at [facility]

## 2024-05-17 ENCOUNTER — Other Ambulatory Visit: Payer: Self-pay | Admitting: Internal Medicine

## 2024-05-17 DIAGNOSIS — J449 Chronic obstructive pulmonary disease, unspecified: Secondary | ICD-10-CM

## 2024-05-17 NOTE — Telephone Encounter (Signed)
 Copied from CRM #[id number]. Topic: Clinical - Medication Refill >> May 17, 2024  1:06 PM Ameerah G wrote: Medication: budesonide -glycopyrrolate -formoterol  (BREZTRI  AEROSPHERE) 160-9-4.8 MCG/ACT AERO inhaler [[id number]]  Has the patient contacted their pharmacy? No (Agent: If no, request that the patient contact the pharmacy for the refill. If patient does not wish to contact the pharmacy document the reason why and proceed with request.) (Agent: If yes, when and what did the pharmacy advise?)   [facility] - [place] - [address] [place] [address] Phone: [phone] Fax: [phone]  Is this the correct pharmacy for this prescription? Yes If no, delete pharmacy and type the correct one.   Has the prescription been filled recently? No  Is the patient out of the medication? No  Has the patient been seen for an appointment in the last year OR does the patient have an upcoming appointment? Yes  Can we respond through MyChart? Yes  Agent: Please be advised that Rx refills may take up to 3 business days. We ask that you follow-up with your pharmacy.

## 2024-05-18 ENCOUNTER — Other Ambulatory Visit: Payer: Self-pay

## 2024-05-18 ENCOUNTER — Encounter: Payer: Self-pay | Admitting: Oncology

## 2024-05-18 MED ORDER — SPIRONOLACTONE 25 MG PO TABS: 25.0000 mg | ORAL_TABLET | Freq: Every day | ORAL | 5 refills | Status: AC

## 2024-05-19 MED ORDER — BREZTRI AEROSPHERE 160-9-4.8 MCG/ACT IN AERO: 2.0000 | INHALATION_SPRAY | Freq: Two times a day (BID) | RESPIRATORY_TRACT | 1 refills | Status: AC

## 2024-05-19 NOTE — Telephone Encounter (Signed)
 Requested medication (s) are due for refill today: no  Requested medication (s) are on the active medication list: yes  Last refill:  02/16/25 10 g 11 RF  Future visit scheduled: yes  Notes to clinic:  pt requesting send to mail order - Med not assigned to a protocol   Requested Prescriptions  Pending Prescriptions Disp Refills   budesonide -glycopyrrolate -formoterol  (BREZTRI  AEROSPHERE) 160-9-4.8 MCG/ACT AERO inhaler 10.7 g 11    Sig: Inhale 2 puffs into the lungs in the morning and at bedtime.     Off-Protocol Failed - 05/19/2024 11:04 AM      Failed - Medication not assigned to a protocol, review manually.      Passed - Valid encounter within last 12 months    Recent Outpatient Visits           2 weeks ago Aneurysm of ascending aorta without rupture   [facility] Bern[facility]end, DO   4 months ago Chronic heart failure with preserved ejection fraction [facility])   [facility], DO   5 months ago Hospital discharge follow-up   [facility] Bern[facility] Fend, DO   6 months ago Hypertension, unspecified type   [facility], DO   9 months ago Chronic obstructive pulmonary disease, unspecified COPD type [facility])   [facility]end, DO       Future Appointments             In 2 months Dunn, Bernardino HERO, PA-C [place] [facility] at [facility]

## 2024-07-03 ENCOUNTER — Ambulatory Visit: Admitting: Internal Medicine

## 2024-07-24 ENCOUNTER — Ambulatory Visit: Admitting: Physician Assistant

## 2024-08-21 ENCOUNTER — Ambulatory Visit: Admitting: Pulmonary Disease

## 2024-08-21 ENCOUNTER — Encounter

## 2024-10-03 ENCOUNTER — Ambulatory Visit: Admitting: Family

## 2024-10-26 ENCOUNTER — Ambulatory Visit
